# Patient Record
Sex: Female | Born: 1945 | Race: White | Hispanic: No | Marital: Married | Smoking: Never smoker
Health system: Southern US, Community
[De-identification: ages and names within clinical notes are randomized; demographics above are authoritative.]

## PROBLEM LIST (undated history)

## (undated) DIAGNOSIS — K224 Dyskinesia of esophagus: Secondary | ICD-10-CM

## (undated) DIAGNOSIS — E119 Type 2 diabetes mellitus without complications: Secondary | ICD-10-CM

## (undated) DIAGNOSIS — G4730 Sleep apnea, unspecified: Secondary | ICD-10-CM

## (undated) DIAGNOSIS — E785 Hyperlipidemia, unspecified: Secondary | ICD-10-CM

## (undated) DIAGNOSIS — D649 Anemia, unspecified: Secondary | ICD-10-CM

## (undated) DIAGNOSIS — M1990 Unspecified osteoarthritis, unspecified site: Secondary | ICD-10-CM

## (undated) DIAGNOSIS — E790 Hyperuricemia without signs of inflammatory arthritis and tophaceous disease: Secondary | ICD-10-CM

## (undated) DIAGNOSIS — K219 Gastro-esophageal reflux disease without esophagitis: Secondary | ICD-10-CM

## (undated) DIAGNOSIS — F419 Anxiety disorder, unspecified: Secondary | ICD-10-CM

## (undated) DIAGNOSIS — B379 Candidiasis, unspecified: Secondary | ICD-10-CM

## (undated) DIAGNOSIS — I679 Cerebrovascular disease, unspecified: Secondary | ICD-10-CM

## (undated) DIAGNOSIS — D126 Benign neoplasm of colon, unspecified: Secondary | ICD-10-CM

## (undated) DIAGNOSIS — K52832 Lymphocytic colitis: Secondary | ICD-10-CM

## (undated) DIAGNOSIS — I10 Essential (primary) hypertension: Secondary | ICD-10-CM

## (undated) DIAGNOSIS — H269 Unspecified cataract: Secondary | ICD-10-CM

## (undated) DIAGNOSIS — K5710 Diverticulosis of small intestine without perforation or abscess without bleeding: Secondary | ICD-10-CM

## (undated) DIAGNOSIS — G5600 Carpal tunnel syndrome, unspecified upper limb: Secondary | ICD-10-CM

## (undated) HISTORY — DX: Lymphocytic colitis: K52.832

## (undated) HISTORY — PX: COLONOSCOPY: SHX174

## (undated) HISTORY — DX: Benign neoplasm of colon, unspecified: D12.6

## (undated) HISTORY — DX: Candidiasis, unspecified: B37.9

## (undated) HISTORY — DX: Hyperuricemia without signs of inflammatory arthritis and tophaceous disease: E79.0

## (undated) HISTORY — DX: Hyperlipidemia, unspecified: E78.5

## (undated) HISTORY — DX: Unspecified cataract: H26.9

## (undated) HISTORY — PX: OTHER SURGICAL HISTORY: SHX169

## (undated) HISTORY — DX: Unspecified osteoarthritis, unspecified site: M19.90

## (undated) HISTORY — DX: Sleep apnea, unspecified: G47.30

## (undated) HISTORY — DX: Type 2 diabetes mellitus without complications: E11.9

## (undated) HISTORY — DX: Gastro-esophageal reflux disease without esophagitis: K21.9

## (undated) HISTORY — PX: CYST REMOVAL HAND: SHX6279

## (undated) HISTORY — DX: Anxiety disorder, unspecified: F41.9

## (undated) HISTORY — PX: APPENDECTOMY: SHX54

## (undated) HISTORY — DX: Anemia, unspecified: D64.9

## (undated) HISTORY — DX: Diverticulosis of small intestine without perforation or abscess without bleeding: K57.10

## (undated) HISTORY — DX: Dyskinesia of esophagus: K22.4

## (undated) HISTORY — DX: Fatty (change of) liver, not elsewhere classified: K76.0

## (undated) HISTORY — DX: Cerebrovascular disease, unspecified: I67.9

## (undated) HISTORY — PX: BREAST BIOPSY: SHX20

---
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
## 1990-06-03 HISTORY — PX: CARPAL TUNNEL RELEASE: SHX101

## 1997-11-03 ENCOUNTER — Ambulatory Visit (HOSPITAL_COMMUNITY): Admission: RE | Admit: 1997-11-03 | Discharge: 1997-11-03 | Payer: Self-pay | Admitting: Internal Medicine

## 1998-08-24 ENCOUNTER — Other Ambulatory Visit: Admission: RE | Admit: 1998-08-24 | Discharge: 1998-08-24 | Payer: Self-pay | Admitting: Internal Medicine

## 1999-02-12 ENCOUNTER — Encounter: Payer: Self-pay | Admitting: Internal Medicine

## 1999-02-12 ENCOUNTER — Ambulatory Visit (HOSPITAL_COMMUNITY): Admission: RE | Admit: 1999-02-12 | Discharge: 1999-02-12 | Payer: Self-pay | Admitting: Internal Medicine

## 2000-06-24 ENCOUNTER — Encounter: Payer: Self-pay | Admitting: Internal Medicine

## 2000-06-24 ENCOUNTER — Ambulatory Visit (HOSPITAL_COMMUNITY): Admission: RE | Admit: 2000-06-24 | Discharge: 2000-06-24 | Payer: Self-pay | Admitting: Internal Medicine

## 2001-08-21 ENCOUNTER — Encounter: Payer: Self-pay | Admitting: Internal Medicine

## 2001-08-21 ENCOUNTER — Ambulatory Visit (HOSPITAL_COMMUNITY): Admission: RE | Admit: 2001-08-21 | Discharge: 2001-08-21 | Payer: Self-pay | Admitting: Internal Medicine

## 2001-09-08 ENCOUNTER — Other Ambulatory Visit: Admission: RE | Admit: 2001-09-08 | Discharge: 2001-09-08 | Payer: Self-pay | Admitting: Internal Medicine

## 2002-11-04 ENCOUNTER — Encounter: Payer: Self-pay | Admitting: Internal Medicine

## 2002-11-04 ENCOUNTER — Ambulatory Visit (HOSPITAL_COMMUNITY): Admission: RE | Admit: 2002-11-04 | Discharge: 2002-11-04 | Payer: Self-pay | Admitting: Internal Medicine

## 2003-10-19 ENCOUNTER — Encounter: Admission: RE | Admit: 2003-10-19 | Discharge: 2003-10-19 | Payer: Self-pay | Admitting: Internal Medicine

## 2003-12-12 ENCOUNTER — Ambulatory Visit (HOSPITAL_COMMUNITY): Admission: RE | Admit: 2003-12-12 | Discharge: 2003-12-12 | Payer: Self-pay | Admitting: Internal Medicine

## 2005-08-14 ENCOUNTER — Ambulatory Visit (HOSPITAL_COMMUNITY): Admission: RE | Admit: 2005-08-14 | Discharge: 2005-08-14 | Payer: Self-pay | Admitting: Internal Medicine

## 2005-11-08 ENCOUNTER — Other Ambulatory Visit: Admission: RE | Admit: 2005-11-08 | Discharge: 2005-11-08 | Payer: Self-pay | Admitting: Internal Medicine

## 2006-01-14 ENCOUNTER — Ambulatory Visit: Payer: Self-pay | Admitting: Gastroenterology

## 2006-01-20 ENCOUNTER — Ambulatory Visit: Payer: Self-pay | Admitting: Internal Medicine

## 2006-01-20 ENCOUNTER — Encounter: Payer: Self-pay | Admitting: Internal Medicine

## 2006-11-05 ENCOUNTER — Ambulatory Visit (HOSPITAL_COMMUNITY): Admission: RE | Admit: 2006-11-05 | Discharge: 2006-11-05 | Payer: Self-pay | Admitting: Internal Medicine

## 2007-05-10 ENCOUNTER — Emergency Department (HOSPITAL_COMMUNITY): Admission: EM | Admit: 2007-05-10 | Discharge: 2007-05-10 | Payer: Self-pay | Admitting: Family Medicine

## 2007-06-16 ENCOUNTER — Encounter: Payer: Self-pay | Admitting: Internal Medicine

## 2007-06-19 ENCOUNTER — Ambulatory Visit (HOSPITAL_COMMUNITY): Admission: RE | Admit: 2007-06-19 | Discharge: 2007-06-19 | Payer: Self-pay | Admitting: Internal Medicine

## 2008-01-07 ENCOUNTER — Ambulatory Visit: Payer: Self-pay | Admitting: Cardiovascular Disease

## 2008-01-08 ENCOUNTER — Ambulatory Visit (HOSPITAL_COMMUNITY): Admission: RE | Admit: 2008-01-08 | Discharge: 2008-01-08 | Payer: Self-pay | Admitting: Internal Medicine

## 2008-01-12 ENCOUNTER — Encounter: Payer: Self-pay | Admitting: Internal Medicine

## 2008-01-18 ENCOUNTER — Ambulatory Visit: Payer: Self-pay

## 2008-01-18 ENCOUNTER — Encounter: Payer: Self-pay | Admitting: Cardiovascular Disease

## 2008-02-16 ENCOUNTER — Ambulatory Visit: Payer: Self-pay | Admitting: Cardiovascular Disease

## 2008-03-03 ENCOUNTER — Encounter: Payer: Self-pay | Admitting: Internal Medicine

## 2008-03-21 DIAGNOSIS — K760 Fatty (change of) liver, not elsewhere classified: Secondary | ICD-10-CM

## 2008-03-21 DIAGNOSIS — Z860100 Personal history of colon polyps, unspecified: Secondary | ICD-10-CM | POA: Insufficient documentation

## 2008-03-21 DIAGNOSIS — E782 Mixed hyperlipidemia: Secondary | ICD-10-CM

## 2008-03-21 DIAGNOSIS — I10 Essential (primary) hypertension: Secondary | ICD-10-CM

## 2008-03-21 DIAGNOSIS — K219 Gastro-esophageal reflux disease without esophagitis: Secondary | ICD-10-CM

## 2008-03-24 ENCOUNTER — Ambulatory Visit: Payer: Self-pay | Admitting: Internal Medicine

## 2008-04-05 ENCOUNTER — Ambulatory Visit: Payer: Self-pay | Admitting: Internal Medicine

## 2008-04-08 ENCOUNTER — Ambulatory Visit: Payer: Self-pay | Admitting: Internal Medicine

## 2008-04-11 LAB — CONVERTED CEMR LAB
Fecal Occult Blood: NEGATIVE
OCCULT 1: NEGATIVE
OCCULT 2: NEGATIVE
OCCULT 3: NEGATIVE
OCCULT 4: NEGATIVE
OCCULT 5: NEGATIVE

## 2008-09-28 ENCOUNTER — Ambulatory Visit (HOSPITAL_COMMUNITY): Admission: RE | Admit: 2008-09-28 | Discharge: 2008-09-28 | Payer: Self-pay | Admitting: Internal Medicine

## 2009-04-07 ENCOUNTER — Ambulatory Visit (HOSPITAL_COMMUNITY): Admission: RE | Admit: 2009-04-07 | Discharge: 2009-04-07 | Payer: Self-pay | Admitting: Internal Medicine

## 2009-12-08 ENCOUNTER — Emergency Department (HOSPITAL_COMMUNITY): Admission: EM | Admit: 2009-12-08 | Discharge: 2009-12-08 | Payer: Self-pay | Admitting: Emergency Medicine

## 2010-03-12 ENCOUNTER — Other Ambulatory Visit: Admission: RE | Admit: 2010-03-12 | Discharge: 2010-03-12 | Payer: Self-pay | Admitting: Internal Medicine

## 2010-07-02 ENCOUNTER — Other Ambulatory Visit (HOSPITAL_COMMUNITY): Payer: Self-pay | Admitting: Internal Medicine

## 2010-07-02 DIAGNOSIS — Z1231 Encounter for screening mammogram for malignant neoplasm of breast: Secondary | ICD-10-CM

## 2010-07-02 DIAGNOSIS — Z1239 Encounter for other screening for malignant neoplasm of breast: Secondary | ICD-10-CM

## 2010-07-11 ENCOUNTER — Ambulatory Visit (HOSPITAL_COMMUNITY)
Admission: RE | Admit: 2010-07-11 | Discharge: 2010-07-11 | Disposition: A | Discharge status: Home / Self Care | Payer: 59 | Source: Ambulatory Visit | Attending: Internal Medicine | Admitting: Internal Medicine

## 2010-07-11 DIAGNOSIS — Z1231 Encounter for screening mammogram for malignant neoplasm of breast: Secondary | ICD-10-CM | POA: Insufficient documentation

## 2010-10-16 NOTE — Assessment & Plan Note (Signed)
[facility]                            CARDIOLOGY OFFICE NOTE   NEYA, CREEGAN                     MRN:          [id number]  DATE:02/16/2008                            DOB:          [birth year]    PRIMARY CARE PHYSICIAN:  Lucky Cowboy, MD   REASON FOR VISIT:  Cardiac followup.   HISTORY OF PRESENT ILLNESS:  [name] is a pleasant 65 year old  Caucasian female with past medical history significant for hypertension,  diabetes mellitus, and hyperlipidemia who initially presented to my  office on January 07, 2008, with complaints of chest pain.  The patient is  noted to have a very strong family history of coronary artery disease  with her mother dying from congestive heart failure in 2008, at age of  63, her father dying of a heart attack at the age of 57, one brother  dying of heart attack at the age of 43, and a second brother who at age  65 was found to have double-vessel coronary artery disease.  Because of  this, the patient was very concerned about her symptoms.  She describes  substernal chest pressure that occurred while she was exerting herself.  This was also associated with occasional back tightness as well.  She  did notice mild shortness of breath when her chest pain occurred.  Because of her multiple coronary artery disease risk factors, her strong  family history of heart disease, and her symptoms, I referred her for an  exercise nuclear stress test as well as a surface echocardiogram.  The  studies are described in detail below, but were overall normal.  The  patient returns today and states that her chest pain has only occurred  occasionally, seems to be less intense that had been prior to her last  visit.  She denies any shortness of breath, palpitations, dizziness,  near syncope, syncope, nausea, vomiting, diaphoresis, orthopnea,  paroxysmal nocturnal dyspnea or lower extremity edema.  She does state  that she has been  under an increased amount of stress at home and does  relate some of her chest tightness to possible anxiety associated with  her increased stress level.   PAST MEDICAL HISTORY:  Unchanged and include hypertension,  hyperlipidemia, diabetes mellitus, and gastroesophageal reflux disease.   CURRENT MEDICATIONS:  1. Fenofibrate 134 mg once daily.  2. Metformin 500 mg twice daily.  3. Lasix 40 mg once daily.  4. Verapamil 60 mg at night.  5. Zetia 10 mg once daily.  6. Klor-Con 20 mEq once daily,  7. Enalapril 20 mg once daily.  8. Atenolol 100 mg once daily.   REVIEW OF SYSTEMS:  As stated in the history of present illness and is  otherwise negative.   PHYSICAL EXAMINATION:  GENERAL:  She is a pleasant middle-aged Caucasian  female in no acute distress.  VITALS:  Weight 172 pounds, blood pressure 178/85, pulse 69 and regular,  respirations 12 and nonlabored.  NECK:  No JVD.  No carotid bruits.  No lymphadenopathy.  No thyromegaly.  SKIN:  Warm and dry.  OROPHARYNX:  Mucous membranes moist.  LUNGS:  Clear to auscultation bilaterally without wheezes, rhonchi, or  crackles noted.  CARDIOVASCULAR:  Regular rate and rhythm without murmurs, gallops, or  rubs noted.  ABDOMEN:  Soft, nontender.  Bowel sounds present.  EXTREMITIES:  No evidence of edema.  Pulses are 2+ in all extremities.   DIAGNOSTIC STUDIES:  1. Exercise myocardial perfusion imaging study performed on January 18, 2008, showed that the patient exercised for 8 minutes and stopped      due to fatigue, but no chest pain.  She did reach 92% of her      predicted maximum heart rate for age.  She achieved 10.1 metabolic      equivalents.  There was normal contractility and thickening in all      areas of myocardium.  Overall, left ventricular function was      normal.  There was no perfusion evidence of ischemia.  Of note, she      did have 1-mm ST depression in the inferior and lateral leads that      was felt to be a  false positive.  2. 2-D surface echocardiogram performed on January 18, 2008 showed that      the overall left ventricular systolic function was vigorous with an      ejection fraction between 65 and 70%.  There were no left      ventricular regional wall motion abnormalities noted.  Left      ventricular wall thickness was mildly increased.  There was mild      focal basal septal hypertrophy.  Features were consistent with a      pseudonormal left ventricular filling pattern that was consistent      with abnormal relaxation.  The left atrium was mildly dilated at 4      cm with an index of 2.2 cm per meter squared.   ASSESSMENT AND PLAN:  Michele [name] is a pleasant 65 year old Caucasian  female with multiple risk factors for coronary artery disease including  hypertension, hyperlipidemia, diabetes mellitus, and a strong family  history of coronary artery disease who presented with chest pain, and is  now found by nuclear stress study to have no evidence of myocardial  ischemia.  Her echocardiogram demonstrates normal left ventricular  function and no significant valvular abnormalities.  Her blood pressure  does remain elevated today.  I would like to double her enalapril to 20  mg b.i.d.  I have encouraged her to check her blood pressures at home  and to alert [place] should her systolic blood pressure remain greater than  140.  We will make further changes in her medications at that time.  I  did not recommend any further cardiac workup at the current time.  She  will continue to follow with Dr. Oneta Rack who is her primary care  physician.  We will be glad to see her back in this office on an as-  needed basis.     Verne Carrow, MD  Electronically Signed    CM/MedQ  DD: 02/16/2008  DT: 02/17/2008  Job #: [id number]   cc:   Lucky Cowboy, M.D.

## 2010-10-16 NOTE — Assessment & Plan Note (Signed)
[facility]                            CARDIOLOGY OFFICE NOTE   AUBREA, MEIXNER                     MRN:          [id number]  DATE:01/07/2008                            DOB:          03/19/1946    PRIMARY CARE PHYSICIAN:  Lucky Cowboy, M.D.   REASON FOR VISIT:  Chest pain.   HISTORY OF PRESENT ILLNESS:  Ms. Delaurentis is a pleasant 65 year old  Caucasian female with past medical  history significant for  hypertension, diabetes, and hyperlipidemia who presents today with  complaints of chest pain.  The patient has a very strong family history  of coronary artery disease with her mother dying from congestive heart  failure in 2008 at age of 67, her father dying of heart attack at the  age of 51.  One brother dying of heart attack at the age of 58, and a  second brother who at age of 66 was found to have double-vessel coronary  artery disease.  Because of this, the patient has become very concerned.  She describes substernal chest pressure that occurs on days exerting  herself.  This is associated with occasional back tightness as well.  There sometime seems to be a mild change in her breathing patterns when  the chest pain occurs.  The chest pain lasts for several minutes and  resolves when she sits down to rest.  She denies any palpitations,  dizziness, near syncope, syncope, nausea, vomiting, diaphoresis,  orthopnea, paroxysmal nocturnal dyspnea, or lower extremity edema.   PAST MEDICAL HISTORY:  1. Hypertension.  2. Hyperlipidemia with elevation in her liver function test with the      use of Lipitor in the past.  The patient apparently had a CT scan      that showed fatty liver around that time.  Her liver function test      did normalize off the Lipitor and she is being continued since that      time on fenofibrate and Zetia for her hyperlipidemia.  3. Diabetes mellitus diagnosed in January of 2006.  4. Gastroesophageal reflux  disease.   PAST SURGICAL HISTORY:  1. Appendectomy.  2. Tonsillectomy.   ALLERGIES:  LIPITOR, which caused elevated liver function test.   CURRENT MEDICATIONS:  1. Fenofibrate 134 mg once daily.  2. Metformin 1 tablet twice daily (dose not documented).  3. Furosemide 40 mg once daily.  4. Verapamil 120 mg 1 half tablet at night.  5. Zetia 10 mg once daily.  6. Klor-Con 20 mEq once daily.  7. Enalapril 20 mg once daily.  8. Atenolol 100 mg once daily.   SOCIAL HISTORY:  The patient denies use of tobacco, alcohol, or illicit  drugs.  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  She has one child who is healthy.   FAMILY HISTORY:  As stated above, there is a significant family history  of coronary artery disease with her father dying at the age of 83 from  an MI, brother dying at the age of 63 from an MI, and another brother  having the diagnosis  of double-vessel coronary disease and subsequent  stent placement at the age of 65.   REVIEW OF SYSTEMS:  As stated in the history of present illness, is  otherwise negative.   PHYSICAL EXAMINATION:  GENERAL:  She is a pleasant, well-appearing  Caucasian female in no acute distress.  VITAL SIGNS:  Blood pressure 174/82, pulse 72, and respirations 12  (recheck blood pressure 166/82).  NECK:  No JVD.  No carotid bruits.  SKIN:  Warm and dry.  LUNGS:  Clear to auscultation bilaterally without wheezes, rhonchi, or  crackles noted.  CARDIOVASCULAR:  Regular rate and rhythm without murmurs, gallops, or  rubs noted.  ABDOMEN:  Soft and nontender.  Bowel sounds present.  EXTREMITIES:  No evidence of edema.  Pulses are 2+ in the dorsalis  pedis, posterior tibial, and bilateral radial arteries.   DIAGNOSTIC STUDIES:  A 12-lead electrocardiogram obtained in our office  today shows normal sinus rhythm with a ventricular rate of 72 beats per  minute and low-voltage QRS.  There are no ischemic changes noted.  All  of the  intervals are within normal limits.   ASSESSMENT AND PLAN:  This is a pleasant 65 year old Caucasian female  with multiple cardiac risk factors including hypertension,  hyperlipidemia, diabetes mellitus, and a strong family history of  coronary artery disease who presents with complaints of mild chest  discomfort and back pain.  Her EKG shows low normal QRS voltage.  Because of her chest pain, back pain, and low voltage QRS, I have  elected to perform a 2-D surface echocardiogram to rule out pericardial  effusion and to assess her left ventricular function.  I have also  performed a myocardial perfusion stress study with exercise stress.  The  patient is agreeable with this.  She will plan on seeing me on return  several weeks after her studies are performed.  I will make no  medication changes at the current time.     Verne Carrow, MD  Electronically Signed   CM/MedQ  DD: 01/07/2008  DT: 01/08/2008  Job #: [id number]   cc:   Pricilla Riffle, MD, [facility]  Lovenia Kim, D.O.  Lucky Cowboy, M.D.

## 2011-01-02 ENCOUNTER — Ambulatory Visit (HOSPITAL_COMMUNITY)
Admission: RE | Admit: 2011-01-02 | Discharge: 2011-01-02 | Disposition: A | Discharge status: Home / Self Care | Payer: 59 | Source: Ambulatory Visit | Attending: Internal Medicine | Admitting: Internal Medicine

## 2011-01-02 ENCOUNTER — Other Ambulatory Visit (HOSPITAL_COMMUNITY): Payer: Self-pay | Admitting: Internal Medicine

## 2011-01-02 DIAGNOSIS — M25519 Pain in unspecified shoulder: Secondary | ICD-10-CM

## 2011-03-05 LAB — GLUCOSE, CAPILLARY
Glucose-Capillary: 110 — ABNORMAL HIGH
Glucose-Capillary: 113 — ABNORMAL HIGH

## 2011-03-18 ENCOUNTER — Other Ambulatory Visit: Payer: Self-pay | Admitting: Internal Medicine

## 2011-03-18 DIAGNOSIS — N63 Unspecified lump in unspecified breast: Secondary | ICD-10-CM

## 2011-03-25 ENCOUNTER — Other Ambulatory Visit: Payer: Self-pay | Admitting: Internal Medicine

## 2011-03-25 ENCOUNTER — Ambulatory Visit
Admission: RE | Admit: 2011-03-25 | Discharge: 2011-03-25 | Disposition: A | Discharge status: Home / Self Care | Payer: 59 | Source: Ambulatory Visit | Attending: Internal Medicine | Admitting: Internal Medicine

## 2011-03-25 DIAGNOSIS — N63 Unspecified lump in unspecified breast: Secondary | ICD-10-CM

## 2011-06-24 ENCOUNTER — Other Ambulatory Visit: Payer: Self-pay | Admitting: Internal Medicine

## 2011-06-24 DIAGNOSIS — Z1231 Encounter for screening mammogram for malignant neoplasm of breast: Secondary | ICD-10-CM

## 2011-08-02 ENCOUNTER — Encounter: Payer: Self-pay | Admitting: Internal Medicine

## 2011-08-19 ENCOUNTER — Ambulatory Visit
Admission: RE | Admit: 2011-08-19 | Discharge: 2011-08-19 | Disposition: A | Discharge status: Home / Self Care | Payer: 59 | Source: Ambulatory Visit | Attending: Internal Medicine | Admitting: Internal Medicine

## 2011-08-19 DIAGNOSIS — Z1231 Encounter for screening mammogram for malignant neoplasm of breast: Secondary | ICD-10-CM

## 2012-02-20 ENCOUNTER — Encounter: Payer: Self-pay | Admitting: Internal Medicine

## 2012-04-02 ENCOUNTER — Other Ambulatory Visit (HOSPITAL_COMMUNITY): Payer: Self-pay | Admitting: Internal Medicine

## 2012-04-02 DIAGNOSIS — M8580 Other specified disorders of bone density and structure, unspecified site: Secondary | ICD-10-CM

## 2012-04-07 ENCOUNTER — Ambulatory Visit (HOSPITAL_COMMUNITY)
Admission: RE | Admit: 2012-04-07 | Discharge: 2012-04-07 | Disposition: A | Discharge status: Home / Self Care | Payer: 59 | Source: Ambulatory Visit | Attending: Internal Medicine | Admitting: Internal Medicine

## 2012-04-07 DIAGNOSIS — M8580 Other specified disorders of bone density and structure, unspecified site: Secondary | ICD-10-CM

## 2012-04-07 DIAGNOSIS — Z780 Asymptomatic menopausal state: Secondary | ICD-10-CM | POA: Insufficient documentation

## 2012-04-07 DIAGNOSIS — Z13820 Encounter for screening for osteoporosis: Secondary | ICD-10-CM | POA: Insufficient documentation

## 2012-04-07 DIAGNOSIS — M899 Disorder of bone, unspecified: Secondary | ICD-10-CM | POA: Insufficient documentation

## 2012-04-07 DIAGNOSIS — M949 Disorder of cartilage, unspecified: Secondary | ICD-10-CM | POA: Insufficient documentation

## 2012-06-01 ENCOUNTER — Encounter (HOSPITAL_COMMUNITY): Payer: Self-pay | Admitting: Cardiology

## 2012-06-01 ENCOUNTER — Emergency Department (HOSPITAL_COMMUNITY)
Admission: EM | Admit: 2012-06-01 | Discharge: 2012-06-01 | Disposition: A | Discharge status: Home / Self Care | Payer: 59 | Attending: Emergency Medicine | Admitting: Emergency Medicine

## 2012-06-01 DIAGNOSIS — E119 Type 2 diabetes mellitus without complications: Secondary | ICD-10-CM | POA: Insufficient documentation

## 2012-06-01 DIAGNOSIS — R197 Diarrhea, unspecified: Secondary | ICD-10-CM | POA: Insufficient documentation

## 2012-06-01 DIAGNOSIS — R42 Dizziness and giddiness: Secondary | ICD-10-CM | POA: Insufficient documentation

## 2012-06-01 DIAGNOSIS — I10 Essential (primary) hypertension: Secondary | ICD-10-CM | POA: Insufficient documentation

## 2012-06-01 DIAGNOSIS — E860 Dehydration: Secondary | ICD-10-CM | POA: Insufficient documentation

## 2012-06-01 DIAGNOSIS — Z79899 Other long term (current) drug therapy: Secondary | ICD-10-CM | POA: Insufficient documentation

## 2012-06-01 HISTORY — DX: Essential (primary) hypertension: I10

## 2012-06-01 LAB — CBC
HCT: 37.6 % (ref 36.0–46.0)
Hemoglobin: 12.3 g/dL (ref 12.0–15.0)
MCH: 27.8 pg (ref 26.0–34.0)
MCHC: 32.7 g/dL (ref 30.0–36.0)
MCV: 85.1 fL (ref 78.0–100.0)
RBC: 4.42 MIL/uL (ref 3.87–5.11)

## 2012-06-01 LAB — BASIC METABOLIC PANEL
Creatinine, Ser: 1.06 mg/dL (ref 0.50–1.10)
GFR calc Af Amer: 62 mL/min — ABNORMAL LOW (ref >90)
Sodium: 142 mEq/L (ref 135–145)

## 2012-06-01 MED ORDER — SODIUM CHLORIDE 0.9 % IV BOLUS (SEPSIS)
1000.0000 mL | Freq: Once | INTRAVENOUS | Status: AC
  Administered 2012-06-01: 1000 mL via INTRAVENOUS

## 2012-06-01 NOTE — ED Provider Notes (Signed)
History     CSN: [id number]  Arrival date & time 06/01/12  1600   First MD Initiated Contact with Patient 06/01/12 1943      Chief Complaint  Patient presents with  . Illegal value: [    dehydrated    (Consider location/radiation/quality/duration/timing/severity/associated sxs/prior treatment) HPI Comments: Patient presents with complaint of diarrhea for the past 2 weeks. Patient states that she has 6-7 episodes of watery, foul-smelling diarrhea that occurs in the late evening and overnight. She has seen her primary care physician for this. Stool cultures have been ordered however patient has not been able to obtain these to this point. Today she was feeling lightheaded and called her doctor. She was told to go to the emergency department for fluids. Patient denies fevers, cold symptoms, nausea, vomiting, chest pain, shortness of breath, abdominal pain. She has not noted blood in her stool. Normal urination. She continues good oral fluid intake. Colonoscopy 5 years ago. No other history of gastrointestinal problems. Patient denies recent travel. She was on a course of penicillin approximately one month ago due to an infected tooth. She does not think that she had diarrhea prior to this. Per her records, she is currently on Cipro 500 mg twice a day. She has held her Lasix due to diarrhea. Onset of symptoms is acute. Course is constant. Nothing makes symptoms better or worse.  The history is provided by the patient.    Past Medical History  Diagnosis Date  . Hypertension   . Diabetes mellitus without complication     History reviewed. No pertinent past surgical history.  History reviewed. No pertinent family history.  History  Substance Use Topics  . Smoking status: Never Smoker   . Smokeless tobacco: Not on file  . Alcohol Use: No    OB History    Grav Para Term Preterm Abortions TAB SAB Ect Mult Living                  Review of Systems  Constitutional: Negative for  fever.  HENT: Negative for sore throat and rhinorrhea.   Eyes: Negative for redness.  Respiratory: Negative for cough.   Cardiovascular: Negative for chest pain.  Gastrointestinal: Positive for diarrhea. Negative for nausea, vomiting, abdominal pain, constipation and blood in stool.  Genitourinary: Negative for dysuria.  Musculoskeletal: Negative for myalgias.  Skin: Negative for rash.  Neurological: Positive for light-headedness. Negative for syncope and headaches.    Allergies  Review of patient's allergies indicates no known allergies.  Home Medications   Current Outpatient Rx  Name  Route  Sig  Dispense  Refill  . ALPRAZOLAM 0.5 MG PO TABS   Oral   Take 0.5 mg by mouth 2 (two) times daily.         . ATENOLOL 100 MG PO TABS   Oral   Take 50 mg by mouth 2 (two) times daily.         Marland Kitchen BIOTIN 1000 MCG PO TABS   Oral   Take 1,000 mcg by mouth daily.         . CHOLECALCIFEROL 400 UNITS PO TABS   Oral   Take 400 Units by mouth 2 (two) times daily.         Marland Kitchen CIPROFLOXACIN HCL 500 MG PO TABS   Oral   Take 500 mg by mouth 2 (two) times daily. 7 day course filled 05/29/12         . CLONIDINE HCL 0.2 MG PO TABS  Oral   Take 0.2 mg by mouth 2 (two) times daily.         . ENALAPRIL MALEATE 20 MG PO TABS   Oral   Take 20 mg by mouth 2 (two) times daily.          Marland Kitchen EZETIMIBE 10 MG PO TABS   Oral   Take 10 mg by mouth every evening.         . FENOFIBRATE MICRONIZED 134 MG PO CAPS   Oral   Take 134 mg by mouth every evening.         Marland Kitchen FLAXSEED OIL PO   Oral   Take 1 capsule by mouth daily.         . FUROSEMIDE 40 MG PO TABS   Oral   Take 40 mg by mouth daily.         Marland Kitchen HYOSCYAMINE SULFATE 0.125 MG PO TABS   Oral   Take 0.125 mg by mouth every 4 (four) hours as needed. For GI spasms         . METFORMIN HCL 500 MG PO TABS   Oral   Take 500 mg by mouth 3 (three) times daily.         Marland Kitchen MILK THISTLE 1000 MG PO CAPS   Oral   Take 1,000  mg by mouth daily.         Marland Kitchen FISH OIL 1000 MG PO CAPS   Oral   Take 1,000 mg by mouth daily.         Marland Kitchen POTASSIUM CHLORIDE CRYS ER 20 MEQ PO TBCR   Oral   Take 20 mEq by mouth daily.          . RED YEAST RICE 600 MG PO CAPS   Oral   Take 600 mg by mouth daily.         Marland Kitchen VERAPAMIL HCL 120 MG PO TABS   Oral   Take 120 mg by mouth 2 (two) times daily.           BP 197/92  Pulse 61  Temp 97.5 F (36.4 C) (Oral)  Resp 18  SpO2 98%  Physical Exam  Nursing note and vitals reviewed. Constitutional: She appears well-developed and well-nourished.  HENT:  Head: Normocephalic and atraumatic.  Mouth/Throat: Uvula is midline and oropharynx is clear and moist. Mucous membranes are not dry.  Eyes: Conjunctivae normal are normal. Right eye exhibits no discharge. Left eye exhibits no discharge.  Neck: Normal range of motion. Neck supple.  Cardiovascular: Normal rate, regular rhythm and normal heart sounds.   No murmur heard. Pulmonary/Chest: Effort normal and breath sounds normal. No respiratory distress. She has no wheezes.  Abdominal: Soft. Bowel sounds are normal. There is no tenderness. There is no rebound and no guarding.  Neurological: She is alert.  Skin: Skin is warm and dry.  Psychiatric: She has a normal mood and affect.    ED Course  Procedures (including critical care time)  Labs Reviewed  BASIC METABOLIC PANEL - Abnormal; Notable for the following:    Glucose, Bld 103 (*)     GFR calc non Af Amer 53 (*)     GFR calc Af Amer 62 (*)     All other components within normal limits  CBC  STOOL CULTURE  CLOSTRIDIUM DIFFICILE BY PCR   No results found.   1. Dehydration   2. Diarrhea     8:21 PM Patient seen and examined. Work-up initiated. Fluids  ordered. Will attempt to get cultures and PCR for C. Diff if patient has BM.   Vital signs reviewed and are as follows: Filed Vitals:   06/01/12 1936  BP: 197/92  Pulse: 61  Temp: 97.5 F (36.4 C)  Resp:  18   Patient d/w Dr. Blinda Leatherwood who has seen. Patient has not had a BM in ED and has been tolerating PO's.   Encouraged to maintain PO's, continue cipro, and get the planned stool samples to PCP ASAP.   The patient was urged to return to the Emergency Department immediately with worsening of current symptoms, worsening abdominal pain, persistent vomiting, blood noted in stools, fever, or any other concerns. The patient verbalized understanding.     MDM  Patient with 2 weeks of diarrhea, good oral intake. She did not have a bowel movement in emergency department so stool cultures and C. difficile PCR was not sent. She does not appear severely dehydrated. She does not have any electrolyte abnormalities. She has a normal white blood cell count. Her abdomen is soft and nontender. No indication for CT scan tonight. Patient appears well, is stable, and has PCP followup. Strict return instructions given. She will need further workup to determine clear etiology, however do not suspect life threatening or dangerous condition tonight.      [place], [place] 06/02/12 [phone]

## 2012-06-01 NOTE — ED Notes (Signed)
Pt reports diarrhea for the past 2 weeks. Went to PCP office was told to bring a stool sample but has been unable to do so. Reports weakness and nausea.

## 2012-06-03 ENCOUNTER — Emergency Department (HOSPITAL_COMMUNITY)
Admission: EM | Admit: 2012-06-03 | Discharge: 2012-06-04 | Disposition: A | Discharge status: Home / Self Care | Payer: 59 | Attending: Emergency Medicine | Admitting: Emergency Medicine

## 2012-06-03 ENCOUNTER — Emergency Department (HOSPITAL_COMMUNITY): Payer: 59

## 2012-06-03 ENCOUNTER — Encounter (HOSPITAL_COMMUNITY): Payer: Self-pay | Admitting: *Deleted

## 2012-06-03 DIAGNOSIS — Z79899 Other long term (current) drug therapy: Secondary | ICD-10-CM | POA: Insufficient documentation

## 2012-06-03 DIAGNOSIS — I10 Essential (primary) hypertension: Secondary | ICD-10-CM

## 2012-06-03 DIAGNOSIS — R51 Headache: Secondary | ICD-10-CM

## 2012-06-03 DIAGNOSIS — G5600 Carpal tunnel syndrome, unspecified upper limb: Secondary | ICD-10-CM | POA: Insufficient documentation

## 2012-06-03 DIAGNOSIS — E119 Type 2 diabetes mellitus without complications: Secondary | ICD-10-CM | POA: Insufficient documentation

## 2012-06-03 HISTORY — DX: Carpal tunnel syndrome, unspecified upper limb: G56.00

## 2012-06-03 MED ORDER — FENTANYL CITRATE 0.05 MG/ML IJ SOLN
INTRAMUSCULAR | Status: AC
  Filled 2012-06-03: qty 2

## 2012-06-03 MED ORDER — OXYCODONE-ACETAMINOPHEN 5-325 MG PO TABS
2.0000 | ORAL_TABLET | Freq: Once | ORAL | Status: AC
  Administered 2012-06-04: 2 via ORAL
  Filled 2012-06-03: qty 2

## 2012-06-03 MED ORDER — METOCLOPRAMIDE HCL 5 MG/ML IJ SOLN
10.0000 mg | Freq: Once | INTRAMUSCULAR | Status: AC
  Administered 2012-06-04: 10 mg via INTRAMUSCULAR
  Filled 2012-06-03: qty 2

## 2012-06-03 MED ORDER — FENTANYL CITRATE 0.05 MG/ML IJ SOLN
100.0000 ug | Freq: Once | INTRAMUSCULAR | Status: AC
  Administered 2012-06-03: 100 ug via NASAL

## 2012-06-03 NOTE — ED Notes (Signed)
Pt back from CT

## 2012-06-03 NOTE — ED Provider Notes (Signed)
History     CSN: [id number]  Arrival date & time 06/03/12  8295   First MD Initiated Contact with Patient 06/03/12 1942      Chief Complaint  Patient presents with  . Headache  . Hypertension    (Consider location/radiation/quality/duration/timing/severity/associated sxs/prior treatment) HPI 67 year old female presents with less than 6 hours of a somewhat acute onset headache over several minutes starting between 3:00 and 3:30 this afternoon without any change in speech or vision swallowing or understanding, she also is no weakness numbness or incoordination, she is no treatment prior to arrival, she does not usually have headaches like this, her headache is severe, there was no thunderclap onset, she also said nonbloody diarrhea about a dozen times a day for the last couple weeks and is here to drop of stool samples for cultures, ova and parasites, and C. difficile. She had unremarkable labs in the ED within the last 2 days for diarrhea. Past Medical History  Diagnosis Date  . Hypertension   . Diabetes mellitus without complication   . Carpal tunnel syndrome     Past Surgical History  Procedure Date  . Appendectomy     History reviewed. No pertinent family history.  History  Substance Use Topics  . Smoking status: Never Smoker   . Smokeless tobacco: Not on file  . Alcohol Use: No    OB History    Grav Para Term Preterm Abortions TAB SAB Ect Mult Living                  Review of Systems 10 Systems reviewed and are negative for acute change except as noted in the HPI. Allergies  Review of patient's allergies indicates no known allergies.  Home Medications   Current Outpatient Rx  Name  Route  Sig  Dispense  Refill  . ALPRAZOLAM 0.5 MG PO TABS   Oral   Take 0.5 mg by mouth 2 (two) times daily.         . ATENOLOL 100 MG PO TABS   Oral   Take 50 mg by mouth 2 (two) times daily.         Marland Kitchen BIOTIN 1000 MCG PO TABS   Oral   Take 1,000 mcg by mouth daily.        . CHOLECALCIFEROL 400 UNITS PO TABS   Oral   Take 400 Units by mouth 2 (two) times daily.         Marland Kitchen CIPROFLOXACIN HCL 500 MG PO TABS   Oral   Take 500 mg by mouth 2 (two) times daily. 7 day course filled 05/29/12         . CLONIDINE HCL 0.2 MG PO TABS   Oral   Take 0.2 mg by mouth 2 (two) times daily.         . ENALAPRIL MALEATE 20 MG PO TABS   Oral   Take 20 mg by mouth 2 (two) times daily.          Marland Kitchen EZETIMIBE 10 MG PO TABS   Oral   Take 10 mg by mouth every evening.         . FENOFIBRATE MICRONIZED 134 MG PO CAPS   Oral   Take 134 mg by mouth every evening.         Marland Kitchen FLAXSEED OIL PO   Oral   Take 1 capsule by mouth daily.         . FUROSEMIDE 40 MG PO TABS   Oral  Take 40 mg by mouth daily.         Marland Kitchen HYOSCYAMINE SULFATE 0.125 MG PO TABS   Oral   Take 0.125 mg by mouth every 4 (four) hours as needed. For GI spasms         . METFORMIN HCL 500 MG PO TABS   Oral   Take 500 mg by mouth 3 (three) times daily.         Marland Kitchen MILK THISTLE 1000 MG PO CAPS   Oral   Take 1,000 mg by mouth daily.         Marland Kitchen FISH OIL 1000 MG PO CAPS   Oral   Take 1,000 mg by mouth daily.         Marland Kitchen POTASSIUM CHLORIDE CRYS ER 20 MEQ PO TBCR   Oral   Take 20 mEq by mouth daily.          . RED YEAST RICE 600 MG PO CAPS   Oral   Take 600 mg by mouth daily.         Marland Kitchen VERAPAMIL HCL 120 MG PO TABS   Oral   Take 120 mg by mouth 2 (two) times daily.         Marland Kitchen METOCLOPRAMIDE HCL 10 MG PO TABS   Oral   Take 1 tablet (10 mg total) by mouth every 6 (six) hours as needed (nausea/headache).   4 tablet   0   . OXYCODONE-ACETAMINOPHEN 5-325 MG PO TABS   Oral   Take 2 tablets by mouth every 4 (four) hours as needed for pain.   6 tablet   0     BP 208/80  Pulse 64  Temp 97.7 F (36.5 C) (Oral)  Resp 16  SpO2 98%  Physical Exam  Nursing note and vitals reviewed. Constitutional:       Awake, alert, nontoxic appearance with baseline speech for  patient.  HENT:  Head: Atraumatic.  Mouth/Throat: No oropharyngeal exudate.  Eyes: EOM are normal. Pupils are equal, round, and reactive to light. Right eye exhibits no discharge. Left eye exhibits no discharge.  Neck: Neck supple.  Cardiovascular: Normal rate and regular rhythm.   No murmur heard. Pulmonary/Chest: Effort normal and breath sounds normal. No stridor. No respiratory distress. She has no wheezes. She has no rales. She exhibits no tenderness.  Abdominal: Soft. Bowel sounds are normal. She exhibits no mass. There is no tenderness. There is no rebound.  Musculoskeletal: She exhibits no edema and no tenderness.       Baseline ROM, moves extremities with no obvious new focal weakness.  Lymphadenopathy:    She has no cervical adenopathy.  Neurological: She is alert.       Awake, alert, cooperative and aware of situation; motor strength bilaterally; sensation normal to light touch bilaterally; peripheral visual fields full to confrontation; no facial asymmetry; tongue midline; major cranial nerves appear intact; no pronator drift, normal finger to nose bilaterally, baseline gait without new ataxia.  Skin: No rash noted.  Psychiatric: She has a normal mood and affect.    ED Course  Procedures (including critical care time)   Labs Reviewed  CLOSTRIDIUM DIFFICILE BY PCR  STOOL CULTURE  OVA AND PARASITE EXAMINATION   Ct Head Wo Contrast  06/03/2012  *RADIOLOGY REPORT*  Clinical Data: Headache, hypertension.  CT HEAD WITHOUT CONTRAST  Technique:  Contiguous axial images were obtained from the base of the skull through the vertex without contrast.  Comparison: 12/08/2009  Findings: No  acute intracranial abnormality.  Specifically, no hemorrhage, hydrocephalus, mass lesion, acute infarction, or significant intracranial injury.  No acute calvarial abnormality.  Mild mucosal thickening in maxillary sinuses and scattered ethmoid air cells.  Mastoids are clear.  IMPRESSION: No acute  intracranial abnormality.  Chronic sinusitis.   Original Report Authenticated By: Charlett Nose, M.D.      1. Headache   2. HYPERTENSION       MDM  Patient / Family / Caregiver understand and agree with initial ED impression and plan with expectations set for ED visit.Patient / Family / Caregiver informed of clinical course, understand medical decision-making process, and agree with plan.I doubt any other EMC precluding discharge at this time including, but not necessarily limited to the following:SAH, CVA, HTN crisis.        Hurman Horn, MD 06/04/12 2119

## 2012-06-03 NOTE — ED Notes (Signed)
Pt states she checked her BP at lunch and it was elevated and she checked again before she left to come to the E.D. and her systolic was above 200. Pt states that she is also having a HA around 15:30. Pt states that she tried taking excedrin migraine and advil with no relief. Pt states that she was seen here Monday for diarhea and she was suppose to bring a sample (which she has.) pt states she took her cloadine and atenolol. around 16:00. Pt BP elevated in triage. Pt has no neuro deficits. A&Ox4. Able to follow commands and move all extremities.

## 2012-06-04 LAB — CLOSTRIDIUM DIFFICILE BY PCR: Toxigenic C. Difficile by PCR: NEGATIVE

## 2012-06-04 MED ORDER — OXYCODONE-ACETAMINOPHEN 5-325 MG PO TABS: 2.0000 | ORAL_TABLET | ORAL | Status: DC | PRN

## 2012-06-04 MED ORDER — METOCLOPRAMIDE HCL 10 MG PO TABS: 10.0000 mg | ORAL_TABLET | Freq: Four times a day (QID) | ORAL | Status: DC | PRN

## 2012-06-04 NOTE — ED Provider Notes (Signed)
Medical screening examination/treatment/procedure(s) were conducted as a shared visit with non-physician practitioner(s) and myself.  I personally evaluated the patient during the encounter.  Repeat evaluation reveals patient feeling much better. Agree with care plan.  Gilda Crease, MD 06/04/12 [phone]

## 2012-06-04 NOTE — ED Notes (Signed)
Pt given discharge paperwork and prescriptions; pt verbalized understanding of discharge instructions and follow-up care; pt did not have any additional questions regarding discharge;

## 2012-06-05 LAB — OVA AND PARASITE EXAMINATION: Ova and parasites: NONE SEEN

## 2012-06-07 LAB — STOOL CULTURE

## 2012-06-26 ENCOUNTER — Other Ambulatory Visit: Payer: Self-pay | Admitting: Physician Assistant

## 2012-06-26 DIAGNOSIS — I10 Essential (primary) hypertension: Secondary | ICD-10-CM

## 2012-07-01 ENCOUNTER — Ambulatory Visit
Admission: RE | Admit: 2012-07-01 | Discharge: 2012-07-01 | Disposition: A | Discharge status: Home / Self Care | Payer: 59 | Source: Ambulatory Visit | Attending: Physician Assistant | Admitting: Physician Assistant

## 2012-07-01 DIAGNOSIS — I10 Essential (primary) hypertension: Secondary | ICD-10-CM

## 2012-08-24 ENCOUNTER — Other Ambulatory Visit: Payer: Self-pay

## 2012-08-24 DIAGNOSIS — Z1231 Encounter for screening mammogram for malignant neoplasm of breast: Secondary | ICD-10-CM

## 2012-09-01 ENCOUNTER — Encounter: Payer: Self-pay | Admitting: *Deleted

## 2012-09-01 ENCOUNTER — Telehealth: Payer: Self-pay | Admitting: Internal Medicine

## 2012-09-01 NOTE — Telephone Encounter (Signed)
Left a message for patient to call me. 

## 2012-09-01 NOTE — Telephone Encounter (Signed)
Patient states she had an episode of diarrhea in January that she ended up in the hospital to get IVF. Her PCP treated her. She had been better but is having diarrhea a night again. States she gets up 6-7 times during the night with diarrhea. Sometimes it occurs when she is sleeping. Does not occur during the day. Her PCP has changed some of her medications and recommended she see her GI MD. Scheduled with Mike Gip, PA on 09/02/12 at 9:00 AM.

## 2012-09-02 ENCOUNTER — Ambulatory Visit (INDEPENDENT_AMBULATORY_CARE_PROVIDER_SITE_OTHER): Payer: 59 | Admitting: Physician Assistant

## 2012-09-02 ENCOUNTER — Encounter: Payer: Self-pay | Admitting: Internal Medicine

## 2012-09-02 ENCOUNTER — Encounter: Payer: Self-pay | Admitting: Physician Assistant

## 2012-09-02 VITALS — BP 110/70 | HR 68 | Ht 63.0 in | Wt 166.4 lb

## 2012-09-02 DIAGNOSIS — R197 Diarrhea, unspecified: Secondary | ICD-10-CM

## 2012-09-02 DIAGNOSIS — R110 Nausea: Secondary | ICD-10-CM

## 2012-09-02 DIAGNOSIS — K219 Gastro-esophageal reflux disease without esophagitis: Secondary | ICD-10-CM

## 2012-09-02 DIAGNOSIS — Z86010 Personal history of colonic polyps: Secondary | ICD-10-CM

## 2012-09-02 MED ORDER — OMEPRAZOLE 20 MG PO CPDR: 20.0000 mg | DELAYED_RELEASE_CAPSULE | Freq: Every day | ORAL | Status: DC

## 2012-09-02 MED ORDER — METRONIDAZOLE 250 MG PO TABS: 250.0000 mg | ORAL_TABLET | Freq: Four times a day (QID) | ORAL | Status: DC

## 2012-09-02 MED ORDER — MOVIPREP 100 G PO SOLR: 1.0000 | Freq: Once | ORAL | Status: DC

## 2012-09-02 MED ORDER — OMEPRAZOLE 20 MG PO CPDR: 20.0000 mg | DELAYED_RELEASE_CAPSULE | Freq: Two times a day (BID) | ORAL | Status: DC

## 2012-09-02 NOTE — Progress Notes (Signed)
Reviewed, I totally agree with the empirical Flagyl.

## 2012-09-02 NOTE — Patient Instructions (Addendum)
We sent prescriptions for Flagyl , take 1 tab 4 times daily with food. We sent the prescriptions to [facility]. We also sent the Moviprep, colonoscopy prep.  Continue Welchol once daily .   You have been scheduled for a colonoscopy with propofol. Please follow written instructions given to you at your visit today.  Please pick up your prep kit at the pharmacy within the next 1-3 days. If you use inhalers (even only as needed), please bring them with you on the day of your procedure.

## 2012-09-02 NOTE — Progress Notes (Signed)
Subjective:    Patient ID: Michele [name], female    DOB: 03-06-1946, 67 y.o.   MRN: [id number]  HPI Michele [name] is a very nice 66 year old white female known to Dr. [name] from prior evaluation for Hemoccult-positive stool. 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 She had upper endoscopy in 2009 which was a normal exam. She was recommended for 5 year followup colonoscopy which has not been done to date. She states she started having problems with diarrhea around Christmas time. She denies taking any antibiotics at that time though does remember dealing with an infected tooth. She had an ER visit and then also had stool cultures stool for O&P and stool for C. difficile by PCR all done on 06/03/2012 withdrawal negative. She says after that time her symptoms got better and then recurred about 3 weeks ago. She says she feels like she's been progressively worse since. She's been having nausea which seems most prominent as prior to episodes of diarrhea area she has been having. Frequent nighttime episodes of urgency and diarrhea with incontinence. She says that her worst she's been up 45 times in 198 change in her sheets do to incontinence. She says she has had some diarrhea during the day but the symptoms seem more prominent at night. Her appetite is been somewhat decrease her weight is down about 5 or 6 pounds. She's not had any fever or chills. He does complain of indigestion and heartburn. She has not really had any abdominal pain or cramping but says she has a lot of of rumbling and  urgency sensation in her abdomen. She has not noted any melena, has on occasion seen just a small amount of blood on the tissue which she attributes to hemorrhoids. On review again she does not recall taking any antibiotics over the past several months, she's not taking any regular NSAIDs, she's not been placed on any new medications or changes to any  dosages of medications.    Review of Systems  Constitutional: Positive for appetite change and unexpected weight change.  HENT: Negative.   Eyes: Negative.   Respiratory: Negative.   Cardiovascular: Negative.   Gastrointestinal: Positive for nausea and diarrhea.  Endocrine: Negative.   Genitourinary: Negative.   Musculoskeletal: Negative.   Skin: Negative.   Allergic/Immunologic: Negative.   Neurological: Negative.   Hematological: Negative.   Psychiatric/Behavioral: Negative.    Outpatient Prescriptions Prior to Visit  Medication Sig Dispense Refill  . ALPRAZolam (XANAX) 0.5 MG tablet Take 0.5 mg by mouth 2 (two) times daily.      Marland Kitchen atenolol (TENORMIN) 100 MG tablet Take 50 mg by mouth 2 (two) times daily.      . Biotin 1000 MCG tablet Take 1,000 mcg by mouth daily.      . cholecalciferol (VITAMIN D) 400 UNITS TABS Take 400 Units by mouth 2 (two) times daily.      . cloNIDine (CATAPRES) 0.2 MG tablet Take 0.2 mg by mouth 2 (two) times daily. Unless pressure is more than 160 then 3 times daily      . enalapril (VASOTEC) 20 MG tablet Take 20 mg by mouth 2 (two) times daily.       . fenofibrate micronized (LOFIBRA) 134 MG capsule Take 134 mg by mouth every evening.      . Flaxseed, Linseed, (FLAXSEED OIL PO) Take 1 capsule by mouth daily.      . furosemide (  LASIX) 40 MG tablet Take 40 mg by mouth daily.      . Milk Thistle 1000 MG CAPS Take 1,000 mg by mouth daily.      . Omega-3 Fatty Acids (FISH OIL) 1000 MG CAPS Take 1,000 mg by mouth daily.      . potassium chloride SA (K-DUR,KLOR-CON) 20 MEQ tablet Take 20 mEq by mouth daily.       . Red Yeast Rice 600 MG CAPS Take 600 mg by mouth daily.      . verapamil (CALAN) 120 MG tablet Take 120 mg by mouth 2 (two) times daily.      Marland Kitchen ezetimibe (ZETIA) 10 MG tablet Take 10 mg by mouth every evening.      . metFORMIN (GLUCOPHAGE) 500 MG tablet Take 500 mg by mouth 3 (three) times daily.      . ciprofloxacin (CIPRO) 500 MG tablet Take 500  mg by mouth 2 (two) times daily. 7 day course filled 05/29/12      . hyoscyamine (LEVSIN, ANASPAZ) 0.125 MG tablet Take 0.125 mg by mouth every 4 (four) hours as needed. For GI spasms      . metoCLOPramide (REGLAN) 10 MG tablet Take 1 tablet (10 mg total) by mouth every 6 (six) hours as needed (nausea/headache).  4 tablet  0  . oxyCODONE-acetaminophen (PERCOCET) 5-325 MG per tablet Take 2 tablets by mouth every 4 (four) hours as needed for pain.  6 tablet  0   No facility-administered medications prior to visit.   No Known Allergies Patient Active Problem List  Diagnosis  . DIABETES MELLITUS  . HYPERLIPIDEMIA  . HYPERTENSION  . GERD  . FATTY LIVER DISEASE  . HEMOCCULT POSITIVE STOOL  . COLONIC POLYPS, ADENOMATOUS, HX OF   History  Substance Use Topics  . Smoking status: Never Smoker   . Smokeless tobacco: Never Used  . Alcohol Use: No   family history includes Diabetes in her father and mother and Heart disease in her brother, father, and mother.     Objective:   Physical Exam well-developed white female in no acute distress, pleasant blood pressure 110/70 pulse 68 height 5 foot 3 weight 166. HEENT; nontraumatic normocephalic EOMI PERRLA sclera anicteric,Neck; Supple no JVD, Cardiovascular; regular rate and rhythm with S1-S2 no murmur or gallop, Pulmonary; clear bilaterally, Abdomen; soft, she is nontender, nondistended, there is no palpable mass or hepatosplenomegaly bowel sounds are active, Rectal; exam not done, EXT;no clubbing cyanosis or edema skin warm and dry, Psych; mood and affect normal and appropriate        Assessment & Plan:   #62 67 year old female with 4 week history of nausea and diarrhea with urgency and intermittent nighttime incontinence. Diarrhea has  primarily been nocturnal. Etiology is not clear, prior infectious workup was negative. #2 history of  adenomatous colon polyps-last colonoscopy August 2007, due for followup #3 diabetes mellitus #4  hypertension #5 hyperlipidemia #6 fatty liver disease  Plan; have obtained copies of labs from Dr. Kathryne Sharper office done earlier this week, CBC was normal, creatinine 1.14. Will schedule for colonoscopy with Dr. Hermelinda Medicus was discussed in detail with the patient and she is agreeable to proceed. She will benefit from random biopsies if mucosa appears normal to rule out a microscopic colitis   Empiric course  metronidazole 250 mg by mouth 4 times daily x10 days Start Prilosec 20 mg by mouth every morning Patient has just started on WelChol earlier this week and will continue 1 dose daily 2 hours  away from any other meds, she does feel like this is been beneficial over the past couple of days

## 2012-09-07 ENCOUNTER — Telehealth: Payer: Self-pay | Admitting: Physician Assistant

## 2012-09-07 NOTE — Telephone Encounter (Signed)
Called and spoke to a pharmacist.  I advised we sent a prescription on 4-2 for the Moviprep.  She said they don't have this request.  I gave her a verbal order for this prescription today.

## 2012-09-07 NOTE — Telephone Encounter (Signed)
Spoke to patient.  I informed her we had to call in the Moviprep to the pharmacy, [address], [place].  She said she didn't get the Flagyl RX either.  I looked and we sent that as well on 4-2 electronically to [facility], [place].  I apologized for her inconvenience.

## 2012-09-11 ENCOUNTER — Ambulatory Visit
Admission: RE | Admit: 2012-09-11 | Discharge: 2012-09-11 | Disposition: A | Discharge status: Home / Self Care | Payer: 59 | Source: Ambulatory Visit

## 2012-09-11 DIAGNOSIS — Z1231 Encounter for screening mammogram for malignant neoplasm of breast: Secondary | ICD-10-CM

## 2012-09-15 ENCOUNTER — Encounter: Payer: Self-pay | Admitting: Physician Assistant

## 2012-09-22 ENCOUNTER — Ambulatory Visit (AMBULATORY_SURGERY_CENTER): Payer: 59 | Admitting: Internal Medicine

## 2012-09-22 ENCOUNTER — Other Ambulatory Visit: Payer: Self-pay | Admitting: Internal Medicine

## 2012-09-22 ENCOUNTER — Other Ambulatory Visit: Payer: Self-pay | Admitting: *Deleted

## 2012-09-22 ENCOUNTER — Encounter: Payer: Self-pay | Admitting: Internal Medicine

## 2012-09-22 ENCOUNTER — Telehealth: Payer: Self-pay | Admitting: *Deleted

## 2012-09-22 ENCOUNTER — Other Ambulatory Visit (INDEPENDENT_AMBULATORY_CARE_PROVIDER_SITE_OTHER): Payer: 59

## 2012-09-22 VITALS — BP 133/67 | HR 59 | Temp 98.3°F | Resp 32 | Ht 63.0 in | Wt 166.0 lb

## 2012-09-22 DIAGNOSIS — K921 Melena: Secondary | ICD-10-CM

## 2012-09-22 DIAGNOSIS — R197 Diarrhea, unspecified: Secondary | ICD-10-CM

## 2012-09-22 DIAGNOSIS — Z860100 Personal history of colon polyps, unspecified: Secondary | ICD-10-CM

## 2012-09-22 DIAGNOSIS — R634 Abnormal weight loss: Secondary | ICD-10-CM

## 2012-09-22 DIAGNOSIS — K52832 Lymphocytic colitis: Secondary | ICD-10-CM

## 2012-09-22 HISTORY — DX: Lymphocytic colitis: K52.832

## 2012-09-22 LAB — GLUCOSE, CAPILLARY

## 2012-09-22 MED ORDER — SODIUM CHLORIDE 0.9 % IV SOLN: 500.0000 mL | INTRAVENOUS | Status: DC

## 2012-09-22 NOTE — Progress Notes (Signed)
Called to room to assist during endoscopic procedure.  Patient ID and intended procedure confirmed with present staff. Received instructions for my participation in the procedure from the performing physician.Called to room to assist during endoscopic procedure.  Patient ID and intended procedure confirmed with present staff. Received instructions for my participation in the procedure from the performing physician. 

## 2012-09-22 NOTE — Op Note (Addendum)
[facility] [address] [place], [address]   COLONOSCOPY PROCEDURE REPORT  PATIENT: Michele [name], Michele [name]  MR#: [id number] BIRTHDATE: January 21, 1946 , 66  yrs. old GENDER: Female ENDOSCOPIST: Hart Carwin, MD REFERRED IO:[id number] Oneta Rack, M.D. 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 MEDICATIONS: MAC sedation, administered by CRNA and propofol (Diprivan) 250mg  IV  DESCRIPTION OF PROCEDURE:   After the risks benefits and alternatives of the procedure were thoroughly explained, informed consent was obtained.  A digital rectal exam revealed decreased sphincter tone.   The LB [id number]-[id number]  endoscope was introduced through the anus and advanced to the cecum, which was identified by both the appendix and ileocecal valve. No adverse events experienced.   The quality of the prep was Moviprep fair The instrument was then slowly withdrawn as the colon was fully examined.      COLON FINDINGS: A normal appearing cecum, ileocecal valve, and appendiceal orifice were identified.  The ascending, hepatic flexure, transverse, splenic flexure, descending, sigmoid colon and rectum appeared unremarkable.  No polyps or cancers were seen. Multiple random biopsies of the area were performed.  Retroflexed views revealed no abnormalities. The time to cecum=  .  Withdrawal time=  .  The scope was withdrawn and the procedure completed. COMPLICATIONS: There were no complications.  ENDOSCOPIC IMPRESSION: Normal colon; multiple random biopsies of the area were performed to r/o microscpic colitis decreased rectal sphincter tone  RECOMMENDATIONS: Await pathology results small bowl follow through ask Dr Karma Lew if possible to switch from Metformin to another medication which does not cause diarrhea Welchol 1/2 package daily ( 1 package causes constipation)   eSigned:  Hart Carwin, MD  09/22/2012 2:53 PM   cc:

## 2012-09-22 NOTE — Telephone Encounter (Signed)
Per Dr. Juanda Chance, needs sprue panel, SED rate, TSH and UGI with SBFT. Diarrhea, weight loss.Spoke with Lyla Son and scheduled for UGI/SBFT on 09/24/12 at 10:30 AM. NPO after midnight.Spoke with Merlene Laughter in [facility] recovery and gave her the appointment date, time and instructions for patient.

## 2012-09-22 NOTE — Progress Notes (Signed)
Patient did not have preoperative order for IV antibiotic SSI prophylaxis. [phone])  Patient did not experience any of the following events: a burn prior to discharge; a fall within the facility; wrong site/side/patient/procedure/implant event; or a hospital transfer or hospital admission upon discharge from the facility. [phone])  Patient to lab before going home.  She is also scheduled for an UGI on 09/24/2012 at [facility]. Patient NPO after midnight.

## 2012-09-22 NOTE — Patient Instructions (Addendum)
YOU HAD AN ENDOSCOPIC PROCEDURE TODAY AT THE [facility]: Refer to the procedure report that was given to you for any specific questions about what was found during the examination.  If the procedure report does not answer your questions, please call your gastroenterologist to clarify.  If you requested that your care partner not be given the details of your procedure findings, then the procedure report has been included in a sealed envelope for you to review at your convenience later.  YOU SHOULD EXPECT: Some feelings of bloating in the abdomen. Passage of more gas than usual.  Walking can help get rid of the air that was put into your GI tract during the procedure and reduce the bloating. If you had a lower endoscopy (such as a colonoscopy or flexible sigmoidoscopy) you may notice spotting of blood in your stool or on the toilet paper. If you underwent a bowel prep for your procedure, then you may not have a normal bowel movement for a few days.  DIET: Your first meal following the procedure should be a light meal and then it is ok to progress to your normal diet.  A half-sandwich or bowl of soup is an example of a good first meal.  Heavy or fried foods are harder to digest and may make you feel nauseous or bloated.  Likewise meals heavy in dairy and vegetables can cause extra gas to form and this can also increase the bloating.  Drink plenty of fluids but you should avoid alcoholic beverages for 24 hours.  ACTIVITY: Your care partner should take you home directly after the procedure.  You should plan to take it easy, moving slowly for the rest of the day.  You can resume normal activity the day after the procedure however you should NOT DRIVE or use heavy machinery for 24 hours (because of the sedation medicines used during the test).    SYMPTOMS TO REPORT IMMEDIATELY: A gastroenterologist can be reached at any hour.  During normal business hours, 8:30 AM to 5:00 PM Monday through Friday,  call [phone].  After hours and on weekends, please call the GI answering service at [phone] who will take a message and have the physician on call contact you.   Following lower endoscopy (colonoscopy or flexible sigmoidoscopy):  Excessive amounts of blood in the stool  Significant tenderness or worsening of abdominal pains  Swelling of the abdomen that is new, acute  Fever of 100F or higher  FOLLOW UP: If any biopsies were taken you will be contacted by phone or by letter within the next 1-3 weeks.  Call your gastroenterologist if you have not heard about the biopsies in 3 weeks.  Our staff will call the home number listed on your records the next business day following your procedure to check on you and address any questions or concerns that you may have at that time regarding the information given to you following your procedure. This is a courtesy call and so if there is no answer at the home number and we have not heard from you through the emergency physician on call, we will assume that you have returned to your regular daily activities without incident.  DR. Juanda Chance WANTS YOU TO HAVE A SMALL BOWEL FOLLOW THROUGH; USE WELCHOL 1/2 PACKAGE DAILY PER DR BRODIE; YOU WILL GO TO THE LAB TODAY FOR BLOODWORK       SIGNATURES/CONFIDENTIALITY: You and/or your care partner have signed paperwork which will be entered into your  electronic medical record.  These signatures attest to the fact that that the information above on your After Visit Summary has been reviewed and is understood.  Full responsibility of the confidentiality of this discharge information lies with you and/or your care-partner.

## 2012-09-23 ENCOUNTER — Other Ambulatory Visit: Payer: Self-pay | Admitting: *Deleted

## 2012-09-23 ENCOUNTER — Telehealth: Payer: Self-pay | Admitting: *Deleted

## 2012-09-23 LAB — CELIAC PANEL 10
Gliadin IgG: 3.6 U/mL (ref <20)
IgA: 101 mg/dL (ref 69–380)
Tissue Transglutaminase Ab, IgA: 2.8 U/mL (ref <20)

## 2012-09-23 NOTE — Telephone Encounter (Signed)
  Follow up Call-  Call back number 09/22/2012  Post procedure Call Back phone  # [phone]  Permission to leave phone message Yes     Patient questions:  Do you have a fever, pain , or abdominal swelling? no Pain Score  0 *  Have you tolerated food without any problems? yes  Have you been able to return to your normal activities? yes  Do you have any questions about your discharge instructions: Diet   no Medications  no Follow up visit  no  Do you have questions or concerns about your Care? no  Actions: * If pain score is 4 or above: No action needed, pain <4.

## 2012-09-24 ENCOUNTER — Ambulatory Visit (HOSPITAL_COMMUNITY)
Admission: RE | Admit: 2012-09-24 | Discharge: 2012-09-24 | Disposition: A | Discharge status: Home / Self Care | Payer: 59 | Source: Ambulatory Visit | Attending: Internal Medicine | Admitting: Internal Medicine

## 2012-09-24 DIAGNOSIS — K224 Dyskinesia of esophagus: Secondary | ICD-10-CM | POA: Insufficient documentation

## 2012-09-24 DIAGNOSIS — R197 Diarrhea, unspecified: Secondary | ICD-10-CM

## 2012-09-24 DIAGNOSIS — R634 Abnormal weight loss: Secondary | ICD-10-CM | POA: Insufficient documentation

## 2012-09-24 DIAGNOSIS — K5710 Diverticulosis of small intestine without perforation or abscess without bleeding: Secondary | ICD-10-CM | POA: Insufficient documentation

## 2012-09-29 ENCOUNTER — Encounter: Payer: Self-pay | Admitting: Internal Medicine

## 2012-09-30 ENCOUNTER — Other Ambulatory Visit: Payer: Self-pay | Admitting: *Deleted

## 2012-09-30 MED ORDER — BUDESONIDE 3 MG PO CP24: ORAL_CAPSULE | ORAL | Status: DC

## 2012-10-02 ENCOUNTER — Encounter: Payer: Self-pay | Admitting: *Deleted

## 2012-12-25 ENCOUNTER — Encounter: Payer: Self-pay | Admitting: Internal Medicine

## 2012-12-25 ENCOUNTER — Ambulatory Visit (INDEPENDENT_AMBULATORY_CARE_PROVIDER_SITE_OTHER): Payer: Medicare Other | Admitting: Internal Medicine

## 2012-12-25 VITALS — BP 102/70 | HR 74 | Ht 63.0 in | Wt 152.0 lb

## 2012-12-25 DIAGNOSIS — K5289 Other specified noninfective gastroenteritis and colitis: Secondary | ICD-10-CM

## 2012-12-25 NOTE — Patient Instructions (Addendum)
CC: Dr Oneta Rack

## 2012-12-25 NOTE — Progress Notes (Signed)
Michele [name] June 19, 1945 MRN [id number]   History of Present Illness:  This is a 67 year old white female diabetic who developed acute diarrhea initially thought to be due to metformin but further evaluation with a colonoscopy in April 2014 showed lymphocytic colitis. She has a history of a tubovillous adenoma in 2007. Her small bowel follow-through showed rather rapid transit time, duodenal diverticulum and mild nonspecific esophageal dysmotility. She has been treated with Entocort 9 mg daily for 4 weeks, 6 mg daily for 4 weeks and 3 mg daily for 4 weeks. She ran out of medications 3 days ago. She currently does not have any diarrhea. Her stools are soft and regular. Her sprue profile was negative. She has lost about 10 pounds as a result of switching from metformin to Invokana 300 mg daily. She feels fine. She has occasional gastroesophageal reflux for which she takes Alka-Seltzer. She does not take Prilosec unless she absolutely has to.   Past Medical History  Diagnosis Date  . Hypertension   . Diabetes mellitus without complication   . Carpal tunnel syndrome   . Tubulovillous adenoma of colon   . Anxiety   . Arthritis   . GERD (gastroesophageal reflux disease)   . Hyperlipidemia   . 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 Fatty liver   . Esophageal motility disorder   . Duodenal diverticulum    Past Surgical History  Procedure Laterality Date  . Appendectomy    . Tonsillectomy    . Carpal tunnel release    . Cyst removal hand      reports that she has never smoked. She has never used smokeless tobacco. She reports that she does not drink alcohol or use illicit drugs. family history includes Colon polyps in her mother; Diabetes in her father and mother; and Heart disease in her brother, father, and mother. No Known Allergies      Review of Systems: Denies dysphagia, heartburn, abdominal pain  The remainder of the 10 point ROS is negative except as outlined in H&P   Physical  Exam: General appearance  Well developed, in no distress. Eyes- non icteric. HEENT nontraumatic, normocephalic. Mouth no lesions, tongue papillated, no cheilosis. Neck supple without adenopathy, thyroid not enlarged, no carotid bruits, no JVD. Lungs Clear to auscultation bilaterally. Cor normal S1, normal S2, regular rhythm, no murmur,  quiet precordium. Abdomen: Soft nontender with normal active bowel sounds. Liver edge at costal margin Rectal: Not repeated Extremities no pedal edema. Skin no lesions., vitiligo base arms and legs Neurological alert and oriented x 3. Psychological normal mood and affect.  Assessment and Plan:  Problem #24 67 year old white female with lymphocytic colitis of unknown etiology which is under control after a 12 week course of steroids. She is off Entocort. I have discussed with her the fact that microscopic colitis may sometimes come back. It could be caused by medications such Zetia, ibuprofen or Celebrex. Since she is doing well, we are not going to change her medications but she is free to go back on her metformin since she likes it better than Invokana. She will talk to [place], a PA at Dr.McKeown's office.   Problem #2 Gastroesophageal reflux and nonspecific mild esophageal motility on an upper GI series. She has Prilosec at home and may take it when necessary.  Problem #3 History of adenomatous colon polyps. There were no polyps on the last colonoscopy. I suggest a recall colonoscopy exam in 2024.   12/25/2012 [name]

## 2013-01-06 ENCOUNTER — Other Ambulatory Visit: Payer: Self-pay

## 2013-04-08 ENCOUNTER — Other Ambulatory Visit: Payer: Self-pay

## 2013-04-27 ENCOUNTER — Other Ambulatory Visit: Payer: Commercial Managed Care - HMO

## 2013-04-27 DIAGNOSIS — E0590 Thyrotoxicosis, unspecified without thyrotoxic crisis or storm: Secondary | ICD-10-CM

## 2013-04-27 LAB — HEPATIC FUNCTION PANEL
ALT: 22 U/L (ref 0–35)
AST: 19 U/L (ref 0–37)
Alkaline Phosphatase: 40 U/L (ref 39–117)
Indirect Bilirubin: 0.3 mg/dL (ref 0.0–0.9)
Total Protein: 6 g/dL (ref 6.0–8.3)

## 2013-05-04 ENCOUNTER — Telehealth: Payer: Self-pay | Admitting: Internal Medicine

## 2013-05-04 NOTE — Telephone Encounter (Signed)
Pt called and asked about iron dose.   OK to reduce from 4 tabs to 2 tabs daily per Dr Oneta Rack

## 2013-05-19 ENCOUNTER — Other Ambulatory Visit: Payer: Self-pay | Admitting: Emergency Medicine

## 2013-05-19 MED ORDER — FENOFIBRATE MICRONIZED 134 MG PO CAPS: 134.0000 mg | ORAL_CAPSULE | Freq: Every evening | ORAL | Status: DC

## 2013-05-24 ENCOUNTER — Other Ambulatory Visit: Payer: Self-pay | Admitting: Internal Medicine

## 2013-05-24 MED ORDER — FENOFIBRATE MICRONIZED 134 MG PO CAPS: 134.0000 mg | ORAL_CAPSULE | Freq: Every evening | ORAL | Status: DC

## 2013-06-26 DIAGNOSIS — E1122 Type 2 diabetes mellitus with diabetic chronic kidney disease: Secondary | ICD-10-CM | POA: Insufficient documentation

## 2013-06-26 DIAGNOSIS — N183 Chronic kidney disease, stage 3 (moderate): Secondary | ICD-10-CM

## 2013-06-28 ENCOUNTER — Encounter: Payer: Self-pay | Admitting: Physician Assistant

## 2013-06-28 ENCOUNTER — Ambulatory Visit (INDEPENDENT_AMBULATORY_CARE_PROVIDER_SITE_OTHER): Payer: Medicare HMO | Admitting: Physician Assistant

## 2013-06-28 VITALS — BP 120/64 | HR 56 | Temp 97.7°F | Resp 16 | Ht 63.0 in | Wt 148.0 lb

## 2013-06-28 DIAGNOSIS — E559 Vitamin D deficiency, unspecified: Secondary | ICD-10-CM

## 2013-06-28 DIAGNOSIS — E119 Type 2 diabetes mellitus without complications: Secondary | ICD-10-CM

## 2013-06-28 DIAGNOSIS — E785 Hyperlipidemia, unspecified: Secondary | ICD-10-CM

## 2013-06-28 DIAGNOSIS — I10 Essential (primary) hypertension: Secondary | ICD-10-CM

## 2013-06-28 DIAGNOSIS — E782 Mixed hyperlipidemia: Secondary | ICD-10-CM

## 2013-06-28 DIAGNOSIS — Z79899 Other long term (current) drug therapy: Secondary | ICD-10-CM

## 2013-06-28 DIAGNOSIS — D649 Anemia, unspecified: Secondary | ICD-10-CM

## 2013-06-28 LAB — IRON AND TIBC
%SAT: 18 % — AB (ref 20–55)
Iron: 87 ug/dL (ref 42–145)
TIBC: 476 ug/dL — ABNORMAL HIGH (ref 250–470)
UIBC: 389 ug/dL (ref 125–400)

## 2013-06-28 LAB — BASIC METABOLIC PANEL WITH GFR
BUN: 13 mg/dL (ref 6–23)
CALCIUM: 9.9 mg/dL (ref 8.4–10.5)
CO2: 27 mEq/L (ref 19–32)
CREATININE: 1.04 mg/dL (ref 0.50–1.10)
Chloride: 102 mEq/L (ref 96–112)
GFR, EST AFRICAN AMERICAN: 64 mL/min
GFR, Est Non African American: 56 mL/min — ABNORMAL LOW
GLUCOSE: 132 mg/dL — AB (ref 70–99)
POTASSIUM: 4.1 meq/L (ref 3.5–5.3)
Sodium: 137 mEq/L (ref 135–145)

## 2013-06-28 LAB — CBC WITH DIFFERENTIAL/PLATELET
BASOS ABS: 0 10*3/uL (ref 0.0–0.1)
BASOS PCT: 0 % (ref 0–1)
EOS ABS: 0.1 10*3/uL (ref 0.0–0.7)
EOS PCT: 2 % (ref 0–5)
HEMATOCRIT: 39.8 % (ref 36.0–46.0)
HEMOGLOBIN: 13.7 g/dL (ref 12.0–15.0)
Lymphocytes Relative: 29 % (ref 12–46)
Lymphs Abs: 1.8 10*3/uL (ref 0.7–4.0)
MCH: 29 pg (ref 26.0–34.0)
MCHC: 34.4 g/dL (ref 30.0–36.0)
MCV: 84.1 fL (ref 78.0–100.0)
MONO ABS: 0.7 10*3/uL (ref 0.1–1.0)
MONOS PCT: 11 % (ref 3–12)
Neutro Abs: 3.7 10*3/uL (ref 1.7–7.7)
Neutrophils Relative %: 58 % (ref 43–77)
Platelets: 218 10*3/uL (ref 150–400)
RBC: 4.73 MIL/uL (ref 3.87–5.11)
RDW: 14.9 % (ref 11.5–15.5)
WBC: 6.3 10*3/uL (ref 4.0–10.5)

## 2013-06-28 LAB — MAGNESIUM: Magnesium: 1.7 mg/dL (ref 1.5–2.5)

## 2013-06-28 LAB — HEMOGLOBIN A1C
HEMOGLOBIN A1C: 6 % — AB (ref <5.7)
Mean Plasma Glucose: 126 mg/dL — ABNORMAL HIGH (ref <117)

## 2013-06-28 LAB — HEPATIC FUNCTION PANEL
ALBUMIN: 4.3 g/dL (ref 3.5–5.2)
ALT: 29 U/L (ref 0–35)
AST: 28 U/L (ref 0–37)
Alkaline Phosphatase: 44 U/L (ref 39–117)
BILIRUBIN INDIRECT: 0.4 mg/dL (ref 0.0–0.9)
Bilirubin, Direct: 0.2 mg/dL (ref 0.0–0.3)
TOTAL PROTEIN: 6.6 g/dL (ref 6.0–8.3)
Total Bilirubin: 0.6 mg/dL (ref 0.3–1.2)

## 2013-06-28 LAB — LIPID PANEL
Cholesterol: 167 mg/dL (ref 0–200)
HDL: 45 mg/dL (ref >39)
LDL Cholesterol: 93 mg/dL (ref 0–99)
Total CHOL/HDL Ratio: 3.7 Ratio
Triglycerides: 147 mg/dL (ref <150)
VLDL: 29 mg/dL (ref 0–40)

## 2013-06-28 LAB — TSH: TSH: 0.454 u[IU]/mL (ref 0.350–4.500)

## 2013-06-28 LAB — FERRITIN: Ferritin: 97 ng/mL (ref 10–291)

## 2013-06-28 LAB — VITAMIN B12: VITAMIN B 12: 684 pg/mL (ref 211–911)

## 2013-06-28 NOTE — Patient Instructions (Signed)
Can do vicks vapor rub or over the counter nail fungus two times a day on your toe,    Bad carbs also include fruit juice, alcohol, and sweet tea. These are empty calories that do not signal to your brain that you are full.   Please remember the good carbs are still carbs which convert into sugar. So please measure them out no more than 1/2-1 cup of rice, oatmeal, pasta, and beans.  Veggies are however free foods! Pile them on.   I like lean protein at every meal such as chicken, [place], pork chops, cottage cheese, etc. Just do not fry these meats and please center your meal around vegetable, the meats should be a side dish.   No all fruit is created equal. Please see the list below, the fruit at the bottom is higher in sugars than the fruit at the top

## 2013-06-28 NOTE — Progress Notes (Signed)
HPI Patient presents for 3 month follow up with hypertension, hyperlipidemia, diabetes and vitamin D. Patient's blood pressure has been controlled at home, today their BP is BP: 120/64 mmHg Patient denies chest pain, shortness of breath, dizziness.  Patient's cholesterol is diet controlled. In addition they are on fenofibrate and denies myalgias. The cholesterol last visit was The patient has been working on diet and exercise for Diabetes, and denies changes in vision, polys, and paresthesias. She states her average sugar is 100 or less.  She has lymphocytic colitis and occasionally still has diarrhea, she could not afford the Enocort.  She has anemia and feels better on iron.  Patient is on Vitamin D supplement.    Current Medications:  Current Outpatient Prescriptions on File Prior to Visit  Medication Sig Dispense Refill  . ALPRAZolam (XANAX) 0.5 MG tablet Take 0.5 mg by mouth at bedtime as needed.       Marland Kitchen atenolol (TENORMIN) 100 MG tablet Take 50 mg by mouth 2 (two) times daily.      . Biotin 1000 MCG tablet Take 1,000 mcg by mouth daily.      . cloNIDine (CATAPRES) 0.2 MG tablet Take 0.2 mg by mouth 2 (two) times daily. Unless pressure is more than 160 then 3 times daily      . enalapril (VASOTEC) 20 MG tablet Take 20 mg by mouth 2 (two) times daily. 10 mg in morning and 20 mg in evening      . ezetimibe (ZETIA) 10 MG tablet Take 10 mg by mouth every evening.      . fenofibrate micronized (LOFIBRA) 134 MG capsule Take 1 capsule (134 mg total) by mouth every evening.  90 capsule  1  . furosemide (LASIX) 40 MG tablet Take 40 mg by mouth daily.      . INVOKANA 300 MG TABS Take 300 mg by mouth. 1/2 tab in mornings      . metFORMIN (GLUCOPHAGE) 500 MG tablet Take 500 mg by mouth 3 (three) times daily.       . Milk Thistle 1000 MG CAPS Take 1,000 mg by mouth daily.      . Multiple Vitamins-Minerals (HAIR/SKIN/NAILS/BIOTIN PO) Take by mouth.      Marland Kitchen omeprazole (PRILOSEC) 20 MG capsule Take 1  capsule (20 mg total) by mouth daily.  30 capsule  2  . ONE TOUCH ULTRA TEST test strip       . potassium chloride SA (K-DUR,KLOR-CON) 20 MEQ tablet Take 20 mEq by mouth daily.       . Red Yeast Rice 600 MG CAPS Take 600 mg by mouth 2 (two) times daily.       . verapamil (CALAN) 120 MG tablet Take 120 mg by mouth. 1/2 in morning whole at night       No current facility-administered medications on file prior to visit.   Medical History:  Past Medical History  Diagnosis Date  . Hypertension   . Carpal tunnel syndrome   . Tubulovillous adenoma of colon   . GERD (gastroesophageal reflux disease)   . Hyperlipidemia   . 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 Fatty liver   . Esophageal motility disorder   . Duodenal diverticulum   . Anxiety   . Arthritis   . Type II or unspecified type diabetes mellitus without mention of complication, not stated as uncontrolled    Allergies:  Allergies  Allergen Reactions  . Lipitor [Atorvastatin]     Elevates LFT's    ROS Constitutional:  Denies fever, chills, headaches, insomnia, fatigue, night sweats Eyes: Denies redness, blurred vision, diplopia, discharge, itchy, watery eyes.  ENT: Denies congestion, post nasal drip, sore throat, earache, dental pain, Tinnitus, Vertigo, Sinus pain, snoring.  Cardio: Denies chest pain, palpitations, irregular heartbeat, dyspnea, diaphoresis, orthopnea, PND, claudication, edema Respiratory: denies cough, shortness of breath, wheezing.  Gastrointestinal: + diarrhea Denies dysphagia, heartburn, AB pain/ cramps, N/V,  constipation, hematemesis, melena, hematochezia,  hemorrhoids Genitourinary: Denies dysuria, frequency, urgency, nocturia, hesitancy, discharge, hematuria, flank pain Musculoskeletal: Denies myalgia, stiffness, pain, swelling and strain/sprain. Skin: + nail fungus Denies pruritis, rash, changing in skin lesion Neuro: Denies Weakness, tremor, incoordination, spasms, pain Psychiatric: Denies confusion, memory  loss, sensory loss Endocrine: Denies change in weight, skin, hair change, nocturia Diabetic Polys, Denies visual blurring, hyper /hypo glycemic episodes, and paresthesia, Heme/Lymph: Denies Excessive bleeding, bruising, enlarged lymph nodes  Family history- Review and unchanged Social history- Review and unchanged Physical Exam: Filed Vitals:   06/28/13 0937  BP: 120/64  Pulse: 56  Temp: 97.7 F (36.5 C)  Resp: 16   Filed Weights   06/28/13 0937  Weight: 148 lb (67.132 kg)   General Appearance: Well nourished, in no apparent distress. Eyes: PERRLA, EOMs, conjunctiva no swelling or erythema Sinuses: No Frontal/maxillary tenderness ENT/Mouth: Ext aud canals clear, TMs without erythema, bulging. No erythema, swelling, or exudate on post pharynx.  Tonsils not swollen or erythematous. Hearing normal.  Neck: Supple, thyroid normal.  Respiratory: Respiratory effort normal, BS equal bilaterally without rales, rhonchi, wheezing or stridor.  Cardio: RRR with no MRGs. Brisk peripheral pulses without edema.  Abdomen: Soft, + BS.  Non tender, no guarding, rebound, hernias, masses. Lymphatics: Non tender without lymphadenopathy.  Musculoskeletal: Full ROM, 5/5 strength, normal gait.  Skin: Warm, dry without rashes, lesions, ecchymosis.  Neuro: Cranial nerves intact. No cerebellar symptoms. Sensation intact.  Psych: Awake and oriented X 3, normal affect, Insight and Judgment appropriate.   Assessment and Plan:  Hypertension: Continue medication, monitor blood pressure at home.  Continue DASH diet. Cholesterol: Continue diet and exercise. Check cholesterol.  Diabetes-Continue diet and exercise. Check A1C Vitamin D Def- check level and continue medications.   Continue diet and meds as discussed. Further disposition pending results of labs. Discussed med's effects and SE's.    Vicie Mutters 10:16 AM

## 2013-06-29 ENCOUNTER — Other Ambulatory Visit: Payer: Self-pay | Admitting: *Deleted

## 2013-06-29 LAB — INSULIN, FASTING: Insulin fasting, serum: 22 u[IU]/mL (ref 3–28)

## 2013-06-29 LAB — VITAMIN D 25 HYDROXY (VIT D DEFICIENCY, FRACTURES): Vit D, 25-Hydroxy: 62 ng/mL (ref 30–89)

## 2013-06-29 MED ORDER — FUROSEMIDE 40 MG PO TABS: 40.0000 mg | ORAL_TABLET | Freq: Every day | ORAL | Status: DC

## 2013-07-05 ENCOUNTER — Other Ambulatory Visit: Payer: Self-pay | Admitting: Physician Assistant

## 2013-07-05 MED ORDER — FUROSEMIDE 40 MG PO TABS
40.0000 mg | ORAL_TABLET | Freq: Every day | ORAL | Status: DC
Start: 2013-07-05 — End: 2013-07-26

## 2013-07-19 ENCOUNTER — Other Ambulatory Visit: Payer: Self-pay | Admitting: *Deleted

## 2013-07-19 MED ORDER — ENALAPRIL MALEATE 20 MG PO TABS: 20.0000 mg | ORAL_TABLET | Freq: Two times a day (BID) | ORAL | Status: DC

## 2013-07-26 ENCOUNTER — Other Ambulatory Visit: Payer: Self-pay

## 2013-07-26 MED ORDER — POTASSIUM CHLORIDE CRYS ER 20 MEQ PO TBCR: 20.0000 meq | EXTENDED_RELEASE_TABLET | Freq: Every day | ORAL | Status: DC

## 2013-07-26 MED ORDER — CLONIDINE HCL 0.2 MG PO TABS: 0.2000 mg | ORAL_TABLET | Freq: Two times a day (BID) | ORAL | Status: DC

## 2013-07-26 MED ORDER — METFORMIN HCL ER 500 MG PO TB24: 500.0000 mg | ORAL_TABLET | Freq: Three times a day (TID) | ORAL | Status: DC

## 2013-07-26 MED ORDER — ALPRAZOLAM 1 MG PO TABS: 1.0000 mg | ORAL_TABLET | Freq: Every day | ORAL | Status: DC

## 2013-07-26 MED ORDER — ATENOLOL 100 MG PO TABS: 100.0000 mg | ORAL_TABLET | Freq: Every day | ORAL | Status: DC

## 2013-07-26 MED ORDER — VERAPAMIL HCL 120 MG PO TABS: 120.0000 mg | ORAL_TABLET | Freq: Two times a day (BID) | ORAL | Status: DC

## 2013-07-26 MED ORDER — FUROSEMIDE 40 MG PO TABS: 40.0000 mg | ORAL_TABLET | Freq: Every day | ORAL | Status: DC

## 2013-07-26 MED ORDER — CITALOPRAM HYDROBROMIDE 20 MG PO TABS: 20.0000 mg | ORAL_TABLET | Freq: Every day | ORAL | Status: DC

## 2013-07-26 MED ORDER — TRAMADOL HCL 50 MG PO TABS: 50.0000 mg | ORAL_TABLET | Freq: Four times a day (QID) | ORAL | Status: DC

## 2013-07-26 MED ORDER — FENOFIBRATE MICRONIZED 134 MG PO CAPS: 134.0000 mg | ORAL_CAPSULE | Freq: Every evening | ORAL | Status: DC

## 2013-07-26 MED ORDER — EZETIMIBE 10 MG PO TABS: 10.0000 mg | ORAL_TABLET | Freq: Every evening | ORAL | Status: DC

## 2013-07-26 MED ORDER — ENALAPRIL MALEATE 20 MG PO TABS: 20.0000 mg | ORAL_TABLET | Freq: Two times a day (BID) | ORAL | Status: DC

## 2013-08-17 ENCOUNTER — Other Ambulatory Visit: Payer: Self-pay | Admitting: Internal Medicine

## 2013-08-17 ENCOUNTER — Telehealth: Payer: Self-pay | Admitting: Internal Medicine

## 2013-08-17 MED ORDER — CLONIDINE HCL 0.2 MG PO TABS: 0.2000 mg | ORAL_TABLET | Freq: Two times a day (BID) | ORAL | Status: DC

## 2013-08-17 NOTE — Telephone Encounter (Signed)
CLONDINE- PT LOST BOTTLE OF MEDICINE, AND NOT REFILLABE UNTIL 09-17-13.  SHE TAKES FOR BLOOD PRESSURE. PLEASE ADVISE WHAT TO DO.  [phone]  LOCAL PHARM IS [facility] [place]

## 2013-08-17 NOTE — Telephone Encounter (Signed)
[name] - please call Michele [name] and let her know I sent in Rx to last 90 days

## 2013-08-18 ENCOUNTER — Other Ambulatory Visit: Payer: Self-pay | Admitting: Emergency Medicine

## 2013-08-27 ENCOUNTER — Other Ambulatory Visit: Payer: Self-pay | Admitting: *Deleted

## 2013-08-27 MED ORDER — GLUCOSE BLOOD VI STRP: ORAL_STRIP | Status: DC

## 2013-08-27 MED ORDER — GLUCOSE BLOOD VI STRP: 1.0000 | ORAL_STRIP | Freq: Three times a day (TID) | Status: DC

## 2013-10-01 ENCOUNTER — Ambulatory Visit (INDEPENDENT_AMBULATORY_CARE_PROVIDER_SITE_OTHER): Payer: Commercial Managed Care - HMO | Admitting: Internal Medicine

## 2013-10-01 ENCOUNTER — Encounter: Payer: Self-pay | Admitting: Internal Medicine

## 2013-10-01 VITALS — BP 106/64 | HR 60 | Temp 97.7°F | Resp 16 | Ht 63.0 in | Wt 146.6 lb

## 2013-10-01 DIAGNOSIS — E1129 Type 2 diabetes mellitus with other diabetic kidney complication: Secondary | ICD-10-CM

## 2013-10-01 DIAGNOSIS — I10 Essential (primary) hypertension: Secondary | ICD-10-CM

## 2013-10-01 DIAGNOSIS — E559 Vitamin D deficiency, unspecified: Secondary | ICD-10-CM

## 2013-10-01 DIAGNOSIS — Z79899 Other long term (current) drug therapy: Secondary | ICD-10-CM

## 2013-10-01 DIAGNOSIS — E785 Hyperlipidemia, unspecified: Secondary | ICD-10-CM

## 2013-10-01 LAB — CBC WITH DIFFERENTIAL/PLATELET
Basophils Absolute: 0 10*3/uL (ref 0.0–0.1)
Basophils Relative: 0 % (ref 0–1)
Eosinophils Absolute: 0.1 10*3/uL (ref 0.0–0.7)
Eosinophils Relative: 2 % (ref 0–5)
HCT: 36.2 % (ref 36.0–46.0)
HEMOGLOBIN: 12.4 g/dL (ref 12.0–15.0)
LYMPHS ABS: 1.7 10*3/uL (ref 0.7–4.0)
LYMPHS PCT: 33 % (ref 12–46)
MCH: 28.1 pg (ref 26.0–34.0)
MCHC: 34.3 g/dL (ref 30.0–36.0)
MCV: 81.9 fL (ref 78.0–100.0)
MONOS PCT: 10 % (ref 3–12)
Monocytes Absolute: 0.5 10*3/uL (ref 0.1–1.0)
NEUTROS ABS: 2.9 10*3/uL (ref 1.7–7.7)
NEUTROS PCT: 55 % (ref 43–77)
Platelets: 204 10*3/uL (ref 150–400)
RBC: 4.42 MIL/uL (ref 3.87–5.11)
RDW: 14.7 % (ref 11.5–15.5)
WBC: 5.3 10*3/uL (ref 4.0–10.5)

## 2013-10-01 LAB — HEMOGLOBIN A1C
Hgb A1c MFr Bld: 6 % — ABNORMAL HIGH (ref <5.7)
Mean Plasma Glucose: 126 mg/dL — ABNORMAL HIGH (ref <117)

## 2013-10-01 NOTE — Patient Instructions (Signed)

## 2013-10-01 NOTE — Progress Notes (Signed)
Patient ID: Michele [name], female   DOB: Oct 26, 1945, 68 y.o.   MRN: [id number]    This very nice 68 y.o. [name] presents for 3 month follow up with Hypertension, Hyperlipidemia, T2 NIDDM w/Stage 3 CKDand Vitamin D Deficiency.    HTN predates since 42. BP has been controlled at home. Today's BP: 106/64 mmHg . Patient denies any cardiac type chest pain, palpitations, dyspnea/orthopnea/PND, dizziness, claudication, or dependent edema.   Hyperlipidemia is controlled with diet & meds. Last Cholesterol was at goal as below. Patient denies myalgias or other med SE's. Patient stopped the Zetia due to high $$ co-pay.  Lab Results  Component Value Date   CHOL 167 06/28/2013   HDL 45 06/28/2013   LDLCALC 93 06/28/2013   TRIG 147 06/28/2013   CHOLHDL 3.7 06/28/2013    Also, the patient has history of T2 NIDDM since 2006 w/Stage 3 CKD and  last A1c was 6.0% in Jan 2015. She reports CBG's range between 100-120 mg% . Patient denies any symptoms of reactive hypoglycemia, diabetic polys, paresthesias or visual blurring. Patient had tried Invokana, but stopped because of the high $$ co-pay.   Further, Patient has history of Vitamin D Deficiency 19 in 2008 and  last vitamin D was 55 in Jan 2015. Patient supplements vitamin D without any suspected side-effects.  Medication Sig  . ALPRAZolam (XANAX) 1 MG tablet Take 1 tablet (1 mg total) by mouth daily.  Marland Kitchen atenolol (TENORMIN) 100 MG tablet Take 1 tablet (100 mg total) by mouth daily.  . Biotin 1000 MCG tablet Take 1,000 mcg by mouth daily.  . Cholecalciferol (VITAMIN D PO) Take 5,000 Int'l Units by mouth daily.   . citalopram (CELEXA) 20 MG tablet Take 1 tablet (20 mg total) by mouth daily.  . cloNIDine (CATAPRES) 0.2 MG tablet Take 1 tablet  2  times daily. Unless pressure is more than 160 then 3 times daily  . enalapril (VASOTEC) 20 MG tablet Take 1 tablet (20 mg total) 2 times daily. 10 mg  morning and 20 mg  evening  . ezetimibe (ZETIA) 10 MG tablet Take 1  tablet (10 mg total) by mouth every evening.  . fenofibrate micronized (LOFIBRA) 134 MG  Take 1 capsule (134 mg total) by mouth every evening.  . furosemide (LASIX) 40 MG tablet Take 1 tablet (40 mg total) by mouth daily.  Marland Kitchen glucose blood test strip 1 each by Other route 3 (three) times daily.  . Iron TABS Take by mouth 2 (two) times daily.  . metFORMIN (GLUCOPHAGE-XR) 500 MG  Take 1 tablet  by mouth 3 times daily.  . Milk Thistle 1000 MG CAPS Take 1,000 mg by mouth daily.  Marland Kitchen HAIR/SKIN/NAILS Take by mouth.  . Omega-3 Fatty Acids (FISH OIL) 1000 MG  Take 1,000 mg by mouth.  . potassium chloride SA (K-DUR,KLOR-CON) 20 MEQ tablet Take 1 tablet (20 mEq total) by mouth daily.  . Red Yeast Rice 600 MG CAPS Take 600 mg by mouth 2 (two) times daily.   . verapamil (CALAN) 120 MG tablet Take 1 tablet (120 mg total) by mouth 2 (two) times daily.     Allergies  Allergen Reactions  . Lipitor [Atorvastatin]     Elevates LFT's   PMHx:   Past Medical History  Diagnosis Date  . Hypertension   . Carpal tunnel syndrome   . Tubulovillous adenoma of colon   . GERD (gastroesophageal reflux disease)   . Hyperlipidemia   . 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  Fatty liver   . Esophageal motility disorder   . Duodenal diverticulum   . Anxiety   . Arthritis   . Type II or unspecified type diabetes mellitus without mention of complication, not stated as uncontrolled    FHx:    Reviewed / unchanged  SHx:    Reviewed / unchanged   Systems Review: Constitutional: Denies fever, chills, wt changes, headaches, insomnia, fatigue, night sweats, change in appetite. Eyes: Denies redness, blurred vision, diplopia, discharge, itchy, watery eyes.  ENT: Denies discharge, congestion, post nasal drip, epistaxis, sore throat, earache, hearing loss, dental pain, tinnitus, vertigo, sinus pain, snoring.  CV: Denies chest pain, palpitations, irregular heartbeat, syncope, dyspnea, diaphoresis, orthopnea, PND, claudication,  edema. Respiratory: denies cough, dyspnea, DOE, pleurisy, hoarseness, laryngitis, wheezing.  Gastrointestinal: Denies dysphagia, odynophagia, heartburn, reflux, water brash, abdominal pain or cramps, nausea, vomiting, bloating, diarrhea, constipation, hematemesis, melena, hematochezia,  or hemorrhoids. Genitourinary: Denies dysuria, frequency, urgency, nocturia, hesitancy, discharge, hematuria, flank pain. Musculoskeletal: Denies arthralgias, myalgias, stiffness, jt. swelling, pain, limp, strain/sprain.  Skin: Denies pruritus, rash, hives, warts, acne, eczema, change in skin lesion(s). Neuro: No weakness, tremor, incoordination, spasms, paresthesia, or pain. Psychiatric: Denies confusion, memory loss, or sensory loss. Endo: Denies change in weight, skin, hair change.  Heme/Lymph: No excessive bleeding, bruising, orenlarged lymph nodes.  Exam:  BP 106/64  Pulse 60  Temp(Src) 97.7 F (36.5 C) (Temporal)  Resp 16  Ht 5\' 3"  (1.6 m)  Wt 146 lb 9.6 oz (66.497 kg)  BMI 25.98 kg/m2  Appears well nourished - in no distress. Eyes: PERRLA, EOMs, conjunctiva no swelling or erythema. Sinuses: No frontal/maxillary tenderness ENT/Mouth: EAC's clear, TM's nl w/o erythema, bulging. Nares clear w/o erythema, swelling, exudates. Oropharynx clear without erythema or exudates. Oral hygiene is good. Tongue normal, non obstructing. Hearing intact.  Neck: Supple. Thyroid nl. Car 2+/2+ without bruits, nodes or JVD. Chest: Respirations nl with BS clear & equal w/o rales, rhonchi, wheezing or stridor.  Cor: Heart sounds normal w/ regular rate and rhythm without sig. murmurs, gallops, clicks, or rubs. Peripheral pulses normal and equal  without edema.  Abdomen: Soft & bowel sounds normal. Non-tender w/o guarding, rebound, hernias, masses, or organomegaly.  Lymphatics: Unremarkable.  Musculoskeletal: Full ROM all peripheral extremities, joint stability, 5/5 strength, and normal gait.  Skin: Warm, dry without  exposed rashes, lesions, ecchymosis apparent.  Neuro: Cranial nerves intact, reflexes equal bilaterally. Sensory-motor testing grossly intact. Tendon reflexes grossly intact.  Pysch: Alert & oriented x 3. Insight and judgement nl & appropriate. No ideations.  Assessment and Plan:  1. Hypertension - Continue monitor blood pressure at home. Continue diet/meds same.  2. Hyperlipidemia - Continue diet, exercise,& lifestyle modifications. Continue monitor periodic cholesterol/liver & renal functions. Patient desires to try stricter diet to see if she can come off of meds.  3. T2 NIDDM w/ Stage 3 CKD - continue recommend prudent low glycemic diet, weight control, regular exercise, diabetic monitoring and periodic eye exams. Desires to try stricter diet to se if she can come off of MF.  4. Vitamin D Deficiency - Continue supplementation.  Recommended regular exercise, BP monitoring, weight control, and discussed med and SE's. Recommended labs to assess and monitor clinical status. Further disposition pending results of labs.

## 2013-10-02 LAB — LIPID PANEL
CHOL/HDL RATIO: 3.6 ratio
Cholesterol: 153 mg/dL (ref 0–200)
HDL: 43 mg/dL (ref >39)
LDL Cholesterol: 84 mg/dL (ref 0–99)
Triglycerides: 130 mg/dL (ref <150)
VLDL: 26 mg/dL (ref 0–40)

## 2013-10-02 LAB — HEPATIC FUNCTION PANEL
ALT: 27 U/L (ref 0–35)
AST: 25 U/L (ref 0–37)
Albumin: 4.1 g/dL (ref 3.5–5.2)
Alkaline Phosphatase: 40 U/L (ref 39–117)
BILIRUBIN DIRECT: 0.2 mg/dL (ref 0.0–0.3)
BILIRUBIN INDIRECT: 0.4 mg/dL (ref 0.2–1.2)
Total Bilirubin: 0.6 mg/dL (ref 0.2–1.2)
Total Protein: 6.2 g/dL (ref 6.0–8.3)

## 2013-10-02 LAB — MAGNESIUM: Magnesium: 1.8 mg/dL (ref 1.5–2.5)

## 2013-10-02 LAB — BASIC METABOLIC PANEL WITH GFR
BUN: 22 mg/dL (ref 6–23)
CHLORIDE: 102 meq/L (ref 96–112)
CO2: 30 meq/L (ref 19–32)
Calcium: 9.2 mg/dL (ref 8.4–10.5)
Creat: 1.06 mg/dL (ref 0.50–1.10)
GFR, Est African American: 63 mL/min
GFR, Est Non African American: 54 mL/min — ABNORMAL LOW
GLUCOSE: 118 mg/dL — AB (ref 70–99)
Potassium: 3.9 mEq/L (ref 3.5–5.3)
SODIUM: 139 meq/L (ref 135–145)

## 2013-10-02 LAB — INSULIN, FASTING: Insulin fasting, serum: 12 u[IU]/mL (ref 3–28)

## 2013-10-02 LAB — VITAMIN D 25 HYDROXY (VIT D DEFICIENCY, FRACTURES): Vit D, 25-Hydroxy: 63 ng/mL (ref 30–89)

## 2013-10-02 LAB — TSH: TSH: 0.508 u[IU]/mL (ref 0.350–4.500)

## 2013-10-20 ENCOUNTER — Other Ambulatory Visit: Payer: Self-pay

## 2013-10-20 DIAGNOSIS — Z1231 Encounter for screening mammogram for malignant neoplasm of breast: Secondary | ICD-10-CM

## 2013-10-28 ENCOUNTER — Other Ambulatory Visit: Payer: Self-pay | Admitting: Internal Medicine

## 2013-11-02 ENCOUNTER — Ambulatory Visit
Admission: RE | Admit: 2013-11-02 | Discharge: 2013-11-02 | Disposition: A | Discharge status: Home / Self Care | Payer: Commercial Managed Care - HMO | Source: Ambulatory Visit

## 2013-11-02 DIAGNOSIS — Z1231 Encounter for screening mammogram for malignant neoplasm of breast: Secondary | ICD-10-CM

## 2013-11-25 ENCOUNTER — Other Ambulatory Visit: Payer: Self-pay

## 2013-11-25 MED ORDER — TRAMADOL HCL 50 MG PO TABS: 50.0000 mg | ORAL_TABLET | Freq: Four times a day (QID) | ORAL | Status: DC

## 2013-11-25 MED ORDER — ALPRAZOLAM 1 MG PO TABS: 1.0000 mg | ORAL_TABLET | Freq: Every day | ORAL | Status: DC

## 2014-01-04 ENCOUNTER — Ambulatory Visit: Payer: Self-pay | Admitting: Emergency Medicine

## 2014-01-19 ENCOUNTER — Other Ambulatory Visit: Payer: Self-pay | Admitting: Physician Assistant

## 2014-01-24 ENCOUNTER — Encounter: Payer: Self-pay | Admitting: Internal Medicine

## 2014-01-26 ENCOUNTER — Encounter: Payer: Self-pay | Admitting: Physician Assistant

## 2014-01-26 ENCOUNTER — Ambulatory Visit (INDEPENDENT_AMBULATORY_CARE_PROVIDER_SITE_OTHER): Payer: Commercial Managed Care - HMO | Admitting: Physician Assistant

## 2014-01-26 VITALS — BP 120/78 | HR 56 | Temp 97.7°F | Resp 16 | Ht 63.0 in | Wt 148.0 lb

## 2014-01-26 DIAGNOSIS — Z789 Other specified health status: Secondary | ICD-10-CM

## 2014-01-26 DIAGNOSIS — Z79899 Other long term (current) drug therapy: Secondary | ICD-10-CM

## 2014-01-26 DIAGNOSIS — E2839 Other primary ovarian failure: Secondary | ICD-10-CM

## 2014-01-26 DIAGNOSIS — E785 Hyperlipidemia, unspecified: Secondary | ICD-10-CM

## 2014-01-26 DIAGNOSIS — I10 Essential (primary) hypertension: Secondary | ICD-10-CM

## 2014-01-26 DIAGNOSIS — E559 Vitamin D deficiency, unspecified: Secondary | ICD-10-CM

## 2014-01-26 DIAGNOSIS — E1129 Type 2 diabetes mellitus with other diabetic kidney complication: Secondary | ICD-10-CM

## 2014-01-26 DIAGNOSIS — Z23 Encounter for immunization: Secondary | ICD-10-CM

## 2014-01-26 DIAGNOSIS — Z0000 Encounter for general adult medical examination without abnormal findings: Secondary | ICD-10-CM

## 2014-01-26 DIAGNOSIS — Z1331 Encounter for screening for depression: Secondary | ICD-10-CM

## 2014-01-26 LAB — BASIC METABOLIC PANEL WITH GFR
BUN: 18 mg/dL (ref 6–23)
CHLORIDE: 99 meq/L (ref 96–112)
CO2: 29 meq/L (ref 19–32)
CREATININE: 0.85 mg/dL (ref 0.50–1.10)
Calcium: 9.4 mg/dL (ref 8.4–10.5)
GFR, Est African American: 81 mL/min
GFR, Est Non African American: 71 mL/min
Glucose, Bld: 127 mg/dL — ABNORMAL HIGH (ref 70–99)
POTASSIUM: 4 meq/L (ref 3.5–5.3)
Sodium: 138 mEq/L (ref 135–145)

## 2014-01-26 LAB — HEPATIC FUNCTION PANEL
ALBUMIN: 4.3 g/dL (ref 3.5–5.2)
ALK PHOS: 63 U/L (ref 39–117)
ALT: 30 U/L (ref 0–35)
AST: 21 U/L (ref 0–37)
BILIRUBIN INDIRECT: 0.7 mg/dL (ref 0.2–1.2)
Bilirubin, Direct: 0.2 mg/dL (ref 0.0–0.3)
Total Bilirubin: 0.9 mg/dL (ref 0.2–1.2)
Total Protein: 6.7 g/dL (ref 6.0–8.3)

## 2014-01-26 LAB — CBC WITH DIFFERENTIAL/PLATELET
Basophils Absolute: 0 10*3/uL (ref 0.0–0.1)
Basophils Relative: 0 % (ref 0–1)
Eosinophils Absolute: 0.2 10*3/uL (ref 0.0–0.7)
Eosinophils Relative: 3 % (ref 0–5)
HCT: 41.1 % (ref 36.0–46.0)
Hemoglobin: 14.2 g/dL (ref 12.0–15.0)
LYMPHS ABS: 2 10*3/uL (ref 0.7–4.0)
LYMPHS PCT: 32 % (ref 12–46)
MCH: 28.4 pg (ref 26.0–34.0)
MCHC: 34.5 g/dL (ref 30.0–36.0)
MCV: 82.2 fL (ref 78.0–100.0)
MONO ABS: 0.5 10*3/uL (ref 0.1–1.0)
MONOS PCT: 9 % (ref 3–12)
NEUTROS ABS: 3.4 10*3/uL (ref 1.7–7.7)
Neutrophils Relative %: 56 % (ref 43–77)
Platelets: 196 10*3/uL (ref 150–400)
RBC: 5 MIL/uL (ref 3.87–5.11)
RDW: 14.3 % (ref 11.5–15.5)
WBC: 6.1 10*3/uL (ref 4.0–10.5)

## 2014-01-26 LAB — LIPID PANEL
Cholesterol: 219 mg/dL — ABNORMAL HIGH (ref 0–200)
HDL: 39 mg/dL — AB (ref >39)
LDL CALC: 137 mg/dL — AB (ref 0–99)
Total CHOL/HDL Ratio: 5.6 Ratio
Triglycerides: 213 mg/dL — ABNORMAL HIGH (ref <150)
VLDL: 43 mg/dL — ABNORMAL HIGH (ref 0–40)

## 2014-01-26 LAB — HEMOGLOBIN A1C
HEMOGLOBIN A1C: 6.3 % — AB (ref <5.7)
Mean Plasma Glucose: 134 mg/dL — ABNORMAL HIGH (ref <117)

## 2014-01-26 LAB — MAGNESIUM: MAGNESIUM: 1.6 mg/dL (ref 1.5–2.5)

## 2014-01-26 LAB — TSH: TSH: 0.286 u[IU]/mL — AB (ref 0.350–4.500)

## 2014-01-26 NOTE — Progress Notes (Signed)
MEDICARE ANNUAL WELLNESS VISIT AND FOLLOW UP  Assessment:   1. HYPERTENSION - CBC with Differential - BASIC METABOLIC PANEL WITH GFR - Hepatic function panel - TSH  2. 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 - Hemoglobin A1c - HM DIABETES FOOT EXAM  3. Encounter for long-term (current) use of other medications - Magnesium  4. HYPERLIPIDEMIA - Lipid panel  5. Vitamin D Deficiency - Vit D  25 hydroxy (rtn osteoporosis monitoring)  6. Estrogen deficiency - DG Bone Density; Future  7. Need for prophylactic vaccination against Streptococcus pneumoniae (pneumococcus) Prevnar 13 given  8.Screening for depression Negative screening  10. Patient had no falls in past year   Plan:   During the course of the visit the patient was educated and counseled about appropriate screening and preventive services including:    Pneumococcal vaccine   Influenza vaccine  Td vaccine  Screening electrocardiogram  Screening mammography  Bone densitometry screening  Colorectal cancer screening  Diabetes screening  Glaucoma screening  Nutrition counseling   Advanced directives: given info/requested  Screening recommendations, referrals:  Vaccinations: Tdap vaccine not indicated Influenza vaccine requested Pneumococcal vaccine not indicated Shingles vaccine not indicated Hep B vaccine not indicated  Nutrition assessed and recommended  Colonoscopy requested Mammogram requested Pap smear not indicated Pelvic exam not indicated Recommended yearly ophthalmology/optometry visit for glaucoma screening and checkup Recommended yearly dental visit for hygiene and checkup Advanced directives - requested  Conditions/risks identified: BMI: Discussed  weight loss, diet, and increase physical activity.  Increase physical activity: AHA recommends 150 minutes of physical activity a week.  Medications reviewed DEXA- ordered Diabetes is at goal, ACE/ARB therapy: Yes. Urinary Incontinence is not an issue: discussed non pharmacology and pharmacology options.  Fall risk: low- discussed PT, home fall assessment, medications.    Subjective:   Michele [name] is a 68 y.o. female who presents for Medicare Annual Wellness Visit and 3 month follow up on hypertension, diabetes, hyperlipidemia, vitamin D def.  Date of last medicare wellness visit is unknown.   Her blood pressure has been controlled at home, today their BP is BP: 120/78 mmHg She does not workout, but she has 0.5 acre garden and helps with a business. She denies chest pain, shortness of breath, dizziness.  She is not on cholesterol medication and denies myalgias. Her cholesterol is at goal. The cholesterol last visit was:   Lab Results  Component Value Date   CHOL 153 10/01/2013   HDL 43 10/01/2013   LDLCALC 84 10/01/2013   TRIG 130 10/01/2013   CHOLHDL 3.6 10/01/2013   She has been working on diet and exercise for diabetes, she has CKD stage III due to DM, she is on an ACE and metformin, and denies paresthesia of the feet, polydipsia and polyuria. Last A1C in the office was:  Lab Results  Component Value Date   HGBA1C 6.0* 10/01/2013   Patient is on Vitamin D supplement. 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   Names of Other Physician/Practitioners you currently use: 1. [facility]- here for primary care 2. [place], eye doctor, last visit Nov 2014 3. Dr. Donnal Debar, dentist, last visit q year Patient Care Team: [name], MD as  PCP - General (Internal Medicine) Dr. Amy Martinique, Derm Dr. Olevia Perches, GI  Medication Review Current Outpatient Prescriptions on File Prior to  Visit  Medication Sig Dispense Refill  . atenolol (TENORMIN) 100 MG tablet Take 1 tablet (100 mg total) by mouth daily.  90 tablet  3  . Biotin 1000 MCG tablet Take 1,000 mcg by mouth daily.      . Blood Glucose Monitoring Suppl (ACCU-CHEK NANO SMARTVIEW) W/DEVICE KIT CHECK BLOOD GLUCOSE THREE TIMES DAILY BEFORE MEALS  1 kit  0  . Cholecalciferol (VITAMIN D PO) Take 5,000 Int'l Units by mouth daily.       . citalopram (CELEXA) 20 MG tablet Take 1 tablet (20 mg total) by mouth daily.  90 tablet  3  . cloNIDine (CATAPRES) 0.2 MG tablet Take 1 tablet (0.2 mg total) by mouth 2 (two) times daily. Unless pressure is more than 160 then 3 times daily  180 tablet  0  . enalapril (VASOTEC) 20 MG tablet TAKE 1 TABLET TWICE DAILY  180 tablet  0  . furosemide (LASIX) 40 MG tablet Take 1 tablet (40 mg total) by mouth daily.  90 tablet  3  . glucose blood test strip 1 each by Other route 3 (three) times daily. Use as instructed.check glucose TID AC  100 each  12  . Iron TABS Take by mouth 2 (two) times daily.      . metFORMIN (GLUCOPHAGE-XR) 500 MG 24 hr tablet Take 1 tablet (500 mg total) by mouth 3 (three) times daily.  270 tablet  3  . Multiple Vitamins-Minerals (HAIR/SKIN/NAILS/BIOTIN PO) Take by mouth.      . Omega-3 Fatty Acids (FISH OIL) 1000 MG CAPS Take 1,000 mg by mouth.      . potassium chloride SA (K-DUR,KLOR-CON) 20 MEQ tablet Take 1 tablet (20 mEq total) by mouth daily.  90 tablet  3  . Red Yeast Rice 600 MG CAPS Take 600 mg by mouth 2 (two) times daily.       . traMADol (ULTRAM) 50 MG tablet Take 1 tablet (50 mg total) by mouth 4 (four) times daily. PRN  360 tablet  1  . verapamil (CALAN) 120 MG tablet Take 1 tablet (120 mg total) by mouth 2 (two) times daily.  180 tablet  3   No current facility-administered medications on file prior to visit.    Current Problems (verified) Patient Active Problem List   Diagnosis Date Noted  . Vitamin D Deficiency 06/28/2013  . Encounter for long-term  (current) use of other medications 06/28/2013  . Anemia 06/28/2013  . Anxiety   . Arthritis   . 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 HYPERLIPIDEMIA 03/21/2008  . HYPERTENSION 03/21/2008  . GERD 03/21/2008  . FATTY LIVER DISEASE 03/21/2008  . COLONIC POLYPS, ADENOMATOUS, HX OF 03/21/2008    Screening Tests Health Maintenance  Topic Date Due  . Foot Exam  [birth year]02/1956  . Ophthalmology Exam  01[birth year]02/1956  . Urine Microalbumin  01[birth year]02/1956  . Influenza Vaccine  01/01/2014  . Hemoglobin A1c  04/03/2014  . Mammogram  11/03/2015  . Colonoscopy  09/22/2017  . Tetanus/tdap  03/26/2023  . Pneumococcal Polysaccharide Vaccine Age 57 And Over  Completed  . Zostavax  Completed     Immunization History  Administered Date(s) Administered  . Influenza Split 03/25/2013  . Pneumococcal Polysaccharide-23 03/14/2011  . Td 03/25/2013  . Zoster 06/03/2005    Preventative care: Last colonoscopy: 2014 due 5 years Last  mammogram: 2015 Last pap smear/pelvic exam: 2011   DEXA:2013 Echo 2010 normal EF  Prior vaccinations: TD or Tdap: 2014  Influenza: 2014 Pneumococcal: 2012 Shingles/Zostavax: 2007  History reviewed: allergies, current medications, past family history, past medical history, past social history, past surgical history and problem list  Risk Factors: Osteoporosis: postmenopausal estrogen deficiency and dietary calcium and/or vitamin D deficiency History of fracture in the past year: no  Tobacco History  Substance Use Topics  . Smoking status: Never Smoker   . Smokeless tobacco: Never Used  . Alcohol Use: No   She does not smoke.  Patient is not a former smoker. Are there smokers in your home (other than you)?  No  Alcohol Current alcohol use: none  Caffeine Current caffeine use: coffee 1 /day  Exercise Current exercise: gardening  Nutrition/Diet Current diet: in general, a "healthy" diet    Cardiac risk factors: advanced age (older than 64 for men, 8  for women), diabetes mellitus, dyslipidemia and hypertension.  Depression Screen (Note: if answer to either of the following is "Yes", a more complete depression screening is indicated)   Q1: Over the past two weeks, have you felt down, depressed or hopeless? No  Q2: Over the past two weeks, have you felt little interest or pleasure in doing things? No  Have you lost interest or pleasure in daily life? No  Do you often feel hopeless? No  Do you cry easily over simple problems? No  Activities of Daily Living In your present state of health, do you have any difficulty performing the following activities?:  Driving? No Managing money?  No Feeding yourself? No Getting from bed to chair? No Climbing a flight of stairs? No Preparing food and eating?: No Bathing or showering? No Getting dressed: No Getting to the toilet? No Using the toilet:No Moving around from place to place: No In the past year have you fallen or had a near fall?:No   Are you sexually active?  No  Do you have more than one partner?  No  Vision Difficulties: No  Hearing Difficulties: No Do you often ask people to speak up or repeat themselves? No Do you experience ringing or noises in your ears? No Do you have difficulty understanding soft or whispered voices? No  Cognition  Do you feel that you have a problem with memory?No  Do you often misplace items? No  Do you feel safe at home?  Yes  Advanced directives Does patient have a [place]? Yes Does patient have a Living Will? Yes   Objective:   Blood pressure 120/78, pulse 56, temperature 97.7 F (36.5 C), resp. rate 16, height 5' 3" (1.6 m), weight 148 lb (67.132 kg). Body mass index is 26.22 kg/(m^2).  General appearance: alert, no distress, WD/WN,  female Cognitive Testing  Alert? Yes  Normal Appearance?Yes  Oriented to person? Yes  Place? Yes   Time? Yes  Recall of three objects?  Yes  Can perform simple calculations?  No  Displays appropriate judgment?Yes  Can read the correct time from a watch face?Yes  HEENT: normocephalic, sclerae anicteric, TMs pearly, nares patent, no discharge or erythema, pharynx normal Oral cavity: MMM, no lesions Neck: supple, no lymphadenopathy, no thyromegaly, no masses Heart: RRR, normal S1, S2, no murmurs Lungs: CTA bilaterally, no wheezes, rhonchi, or rales Abdomen: +bs, soft, non tender, non distended, no masses, no hepatomegaly, no splenomegaly Musculoskeletal: nontender, no swelling, no obvious deformity Extremities: no edema, no cyanosis, no clubbing  Pulses: 2+ symmetric, upper and lower extremities, normal cap refill Neurological: alert, oriented x 3, CN2-12 intact, strength normal upper extremities and lower extremities, sensation normal throughout, DTRs 2+ throughout, no cerebellar signs, gait normal Psychiatric: normal affect, behavior normal, pleasant  Breast: defer Gyn: defer Rectal: defer  Medicare Attestation I have personally reviewed: The patient's medical and social history Their use of alcohol, tobacco or illicit drugs Their current medications and supplements The patient's functional ability including ADLs,fall risks, home safety risks, cognitive, and hearing and visual impairment Diet and physical activities Evidence for depression or mood disorders  The patient's weight, height, BMI, and visual acuity have been recorded in the chart.  I have made referrals, counseling, and provided education to the patient based on review of the above and I have provided the patient with a written personalized care plan for preventive services.     Vicie Mutters, PA-C   01/26/2014

## 2014-01-27 LAB — VITAMIN D 25 HYDROXY (VIT D DEFICIENCY, FRACTURES): Vit D, 25-Hydroxy: 84 ng/mL (ref 30–89)

## 2014-02-10 ENCOUNTER — Telehealth: Payer: Self-pay

## 2014-02-10 NOTE — Telephone Encounter (Signed)
Message copied by Nadyne Coombes on Thu Feb 10, 2014  3:44 PM ------      Message from: Vicie Mutters R      Created: Wed Jan 26, 2014 10:03 AM       DEXA in NOV/DEC ------

## 2014-02-10 NOTE — Telephone Encounter (Signed)
Faxed orders to [facility] for DEXA. [name] will contact patient to schedule apppointment. Patient aware.

## 2014-02-17 ENCOUNTER — Other Ambulatory Visit: Payer: Self-pay | Admitting: Physician Assistant

## 2014-03-07 ENCOUNTER — Telehealth: Payer: Self-pay | Admitting: *Deleted

## 2014-03-07 NOTE — Telephone Encounter (Signed)
Patient aware of normal bone density scan.

## 2014-03-15 ENCOUNTER — Ambulatory Visit (INDEPENDENT_AMBULATORY_CARE_PROVIDER_SITE_OTHER): Payer: Commercial Managed Care - HMO | Admitting: *Deleted

## 2014-03-15 DIAGNOSIS — Z23 Encounter for immunization: Secondary | ICD-10-CM

## 2014-03-16 ENCOUNTER — Other Ambulatory Visit: Payer: Self-pay | Admitting: Physician Assistant

## 2014-03-20 DIAGNOSIS — E2839 Other primary ovarian failure: Secondary | ICD-10-CM

## 2014-03-21 ENCOUNTER — Other Ambulatory Visit: Payer: Self-pay | Admitting: Physician Assistant

## 2014-04-01 ENCOUNTER — Encounter: Payer: Self-pay | Admitting: Internal Medicine

## 2014-05-03 ENCOUNTER — Encounter: Payer: Self-pay | Admitting: Internal Medicine

## 2014-05-03 ENCOUNTER — Ambulatory Visit (INDEPENDENT_AMBULATORY_CARE_PROVIDER_SITE_OTHER): Payer: Commercial Managed Care - HMO | Admitting: Internal Medicine

## 2014-05-03 VITALS — BP 122/70 | HR 68 | Temp 97.9°F | Resp 18 | Ht 63.0 in | Wt 150.2 lb

## 2014-05-03 DIAGNOSIS — E1121 Type 2 diabetes mellitus with diabetic nephropathy: Secondary | ICD-10-CM

## 2014-05-03 DIAGNOSIS — Z0001 Encounter for general adult medical examination with abnormal findings: Secondary | ICD-10-CM

## 2014-05-03 DIAGNOSIS — J0410 Acute tracheitis without obstruction: Secondary | ICD-10-CM

## 2014-05-03 DIAGNOSIS — E782 Mixed hyperlipidemia: Secondary | ICD-10-CM

## 2014-05-03 DIAGNOSIS — B351 Tinea unguium: Secondary | ICD-10-CM

## 2014-05-03 DIAGNOSIS — Z1331 Encounter for screening for depression: Secondary | ICD-10-CM

## 2014-05-03 DIAGNOSIS — Z79899 Other long term (current) drug therapy: Secondary | ICD-10-CM

## 2014-05-03 DIAGNOSIS — Z1212 Encounter for screening for malignant neoplasm of rectum: Secondary | ICD-10-CM

## 2014-05-03 DIAGNOSIS — Z9181 History of falling: Secondary | ICD-10-CM

## 2014-05-03 DIAGNOSIS — I10 Essential (primary) hypertension: Secondary | ICD-10-CM

## 2014-05-03 DIAGNOSIS — E559 Vitamin D deficiency, unspecified: Secondary | ICD-10-CM

## 2014-05-03 MED ORDER — TERBINAFINE HCL 250 MG PO TABS: 250.0000 mg | ORAL_TABLET | Freq: Every day | ORAL | Status: DC

## 2014-05-03 MED ORDER — HYDROCODONE-ACETAMINOPHEN 5-325 MG PO TABS: ORAL_TABLET | ORAL | Status: DC

## 2014-05-03 MED ORDER — BENZONATATE 100 MG PO CAPS: ORAL_CAPSULE | ORAL | Status: DC

## 2014-05-03 MED ORDER — PREDNISONE 20 MG PO TABS: ORAL_TABLET | ORAL | Status: DC

## 2014-05-03 MED ORDER — AZITHROMYCIN 250 MG PO TABS: ORAL_TABLET | ORAL | Status: DC

## 2014-05-03 NOTE — Patient Instructions (Signed)
Recommend the book "The END of DIETING" by Dr Baker Janus   and the book "The END of DIABETES " by Dr Excell Seltzer  At [email or web address] - get book & Audio CD's      Being diabetic has a  300% increased risk for heart attack, stroke, cancer, and alzheimer- type vascular dementia. It is very important that you work harder with diet by avoiding all foods that are white except chicken & fish. Avoid white rice (brown & wild rice is OK), white potatoes (sweetpotatoes in moderation is OK), White bread or wheat bread or anything made out of white flour like bagels, donuts, rolls, buns, biscuits, cakes, pastries, cookies, pizza crust, and pasta (made from white flour & egg whites) - vegetarian pasta or spinach or wheat pasta is OK. Multigrain breads like Arnold's or [employer], or multigrain sandwich thins or flatbreads.  Diet, exercise and weight loss can reverse and cure diabetes in the early stages.  Diet, exercise and weight loss is very important in the control and prevention of complications of diabetes which affects every system in your body, ie. Brain - dementia/stroke, eyes - glaucoma/blindness, heart - heart attack/heart failure, kidneys - dialysis, stomach - gastric paralysis, intestines - malabsorption, nerves - severe painful neuritis, circulation - gangrene & loss of a leg(s), and finally cancer and Alzheimers.    I recommend avoid fried & greasy foods,  sweets/candy, white rice (brown or wild rice or Quinoa is OK), white potatoes (sweet potatoes are OK) - anything made from white flour - bagels, doughnuts, rolls, buns, biscuits,white and wheat breads, pizza crust and traditional pasta made of white flour & egg white(vegetarian pasta or spinach or wheat pasta is OK).  Multi-grain bread is OK - like multi-grain flat bread or sandwich thins. Avoid alcohol in excess. Exercise is also important.    Eat all the vegetables you want - avoid meat, especially red meat and dairy - especially cheese.  Cheese  is the most concentrated form of trans-fats which is the worst thing to clog up our arteries. Veggie cheese is OK which can be found in the fresh produce section at [employer] or [employer] or [employer]  Preventive Care for Adults A healthy lifestyle and preventive care can promote health and wellness. Preventive health guidelines for women include the following key practices.  A routine yearly physical is a good way to check with your health care provider about your health and preventive screening. It is a chance to share any concerns and updates on your health and to receive a thorough exam.  Visit your dentist for a routine exam and preventive care every 6 months. Brush your teeth twice a day and floss once a day. Good oral hygiene prevents tooth decay and gum disease.  The frequency of eye exams is based on your age, health, family medical history, use of contact lenses, and other factors. Follow your health care provider's recommendations for frequency of eye exams.  Eat a healthy diet. Foods like vegetables, fruits, whole grains, low-fat dairy products, and lean protein foods contain the nutrients you need without too many calories. Decrease your intake of foods high in solid fats, added sugars, and salt. Eat the right amount of calories for you.Get information about a proper diet from your health care provider, if necessary.  Regular physical exercise is one of the most important things you can do for your health. Most adults should get at least 150 minutes of moderate-intensity exercise (any activity that increases  your heart rate and causes you to sweat) each week. In addition, most adults need muscle-strengthening exercises on 2 or more days a week.  Maintain a healthy weight. The body mass index (BMI) is a screening tool to identify possible weight problems. It provides an estimate of body fat based on height and weight. Your health care provider can find your BMI and can help you  achieve or maintain a healthy weight.For adults 20 years and older:  A BMI below 18.5 is considered underweight.  A BMI of 18.5 to 24.9 is normal.  A BMI of 25 to 29.9 is considered overweight.  A BMI of 30 and above is considered obese.  Maintain normal blood lipids and cholesterol levels by exercising and minimizing your intake of saturated fat. Eat a balanced diet with plenty of fruit and vegetables. If your lipid or cholesterol levels are high, you are over 50, or you are at high risk for heart disease, you may need your cholesterol levels checked more frequently.Ongoing high lipid and cholesterol levels should be treated with medicines if diet and exercise are not working.  If you smoke, find out from your health care provider how to quit. If you do not use tobacco, do not start.  Lung cancer screening is recommended for adults aged 55-80 years who are at high risk for developing lung cancer because of a history of smoking. A yearly low-dose CT scan of the lungs is recommended for people who have at least a 30-pack-year history of smoking and are a current smoker or have quit within the past 15 years. A pack year of smoking is smoking an average of 1 pack of cigarettes a day for 1 year (for example: 1 pack a day for 30 years or 2 packs a day for 15 years). Yearly screening should continue until the smoker has stopped smoking for at least 15 years. Yearly screening should be stopped for people who develop a health problem that would prevent them from having lung cancer treatment.  Avoid use of street drugs. Do not share needles with anyone. Ask for help if you need support or instructions about stopping the use of drugs.  High blood pressure causes heart disease and increases the risk of stroke.  Ongoing high blood pressure should be treated with medicines if weight loss and exercise do not work.  If you are 55-79 years old, ask your health care provider if you should take aspirin to  prevent strokes.  Diabetes screening involves taking a blood sample to check your fasting blood sugar level. This should be done once every 3 years, after age 45, if you are within normal weight and without risk factors for diabetes. Testing should be considered at a younger age or be carried out more frequently if you are overweight and have at least 1 risk factor for diabetes.  Breast cancer screening is essential preventive care for women. You should practice "breast self-awareness." This means understanding the normal appearance and feel of your breasts and may include breast self-examination. Any changes detected, no matter how small, should be reported to a health care provider. Women in their 20s and 30s should have a clinical breast exam (CBE) by a health care provider as part of a regular health exam every 1 to 3 years. After age 40, women should have a CBE every year. Starting at age 40, women should consider having a mammogram (breast X-ray test) every year. Women who have a family history of breast cancer should   talk to their health care provider about genetic screening. Women at a high risk of breast cancer should talk to their health care providers about having an MRI and a mammogram every year.  Breast cancer gene (BRCA)-related cancer risk assessment is recommended for women who have family members with BRCA-related cancers. BRCA-related cancers include breast, ovarian, tubal, and peritoneal cancers. Having family members with these cancers may be associated with an increased risk for harmful changes (mutations) in the breast cancer genes BRCA1 and BRCA2. Results of the assessment will determine the need for genetic counseling and BRCA1 and BRCA2 testing.  Routine pelvic exams to screen for cancer are no longer recommended for nonpregnant women who are considered low risk for cancer of the pelvic organs (ovaries, uterus, and vagina) and who do not have symptoms. Ask your health care provider  if a screening pelvic exam is right for you.  If you have had past treatment for cervical cancer or a condition that could lead to cancer, you need Pap tests and screening for cancer for at least 20 years after your treatment. If Pap tests have been discontinued, your risk factors (such as having a new sexual partner) need to be reassessed to determine if screening should be resumed. Some women have medical problems that increase the chance of getting cervical cancer. In these cases, your health care provider may recommend more frequent screening and Pap tests.    Colorectal cancer can be detected and often prevented. Most routine colorectal cancer screening begins at the age of 90 years and continues through age 8 years. However, your health care provider may recommend screening at an earlier age if you have risk factors for colon cancer. On a yearly basis, your health care provider may provide home test kits to check for hidden blood in the stool. Use of a small camera at the end of a tube, to directly examine the colon (sigmoidoscopy or colonoscopy), can detect the earliest forms of colorectal cancer. Talk to your health care provider about this at age 86, when routine screening begins. Direct exam of the colon should be repeated every 5-10 years through age 32 years, unless early forms of pre-cancerous polyps or small growths are found.  Osteoporosis is a disease in which the bones lose minerals and strength with aging. This can result in serious bone fractures or breaks. The risk of osteoporosis can be identified using a bone density scan. Women ages 75 years and over and women at risk for fractures or osteoporosis should discuss screening with their health care providers. Ask your health care provider whether you should take a calcium supplement or vitamin D to reduce the rate of osteoporosis.  Menopause can be associated with physical symptoms and risks. Hormone replacement therapy is available to  decrease symptoms and risks. You should talk to your health care provider about whether hormone replacement therapy is right for you.  Use sunscreen. Apply sunscreen liberally and repeatedly throughout the day. You should seek shade when your shadow is shorter than you. Protect yourself by wearing long sleeves, pants, a wide-brimmed hat, and sunglasses year round, whenever you are outdoors.  Once a month, do a whole body skin exam, using a mirror to look at the skin on your back. Tell your health care provider of new moles, moles that have irregular borders, moles that are larger than a pencil eraser, or moles that have changed in shape or color.  Stay current with required vaccines (immunizations).  Influenza vaccine. All adults  should be immunized every year.  Tetanus, diphtheria, and acellular pertussis (Td, Tdap) vaccine. Pregnant women should receive 1 dose of Tdap vaccine during each pregnancy. The dose should be obtained regardless of the length of time since the last dose. Immunization is preferred during the 27th-36th week of gestation. An adult who has not previously received Tdap or who does not know her vaccine status should receive 1 dose of Tdap. This initial dose should be followed by tetanus and diphtheria toxoids (Td) booster doses every 10 years. Adults with an unknown or incomplete history of completing a 3-dose immunization series with Td-containing vaccines should begin or complete a primary immunization series including a Tdap dose. Adults should receive a Td booster every 10 years.    Zoster vaccine. One dose is recommended for adults aged 44 years or older unless certain conditions are present.    Pneumococcal 13-valent conjugate (PCV13) vaccine. When indicated, a person who is uncertain of her immunization history and has no record of immunization should receive the PCV13 vaccine. An adult aged 74 years or older who has certain medical conditions and has not been previously  immunized should receive 1 dose of PCV13 vaccine. This PCV13 should be followed with a dose of pneumococcal polysaccharide (PPSV23) vaccine. The PPSV23 vaccine dose should be obtained at least 8 weeks after the dose of PCV13 vaccine. An adult aged 66 years or older who has certain medical conditions and previously received 1 or more doses of PPSV23 vaccine should receive 1 dose of PCV13. The PCV13 vaccine dose should be obtained 1 or more years after the last PPSV23 vaccine dose.    Pneumococcal polysaccharide (PPSV23) vaccine. When PCV13 is also indicated, PCV13 should be obtained first. All adults aged 89 years and older should be immunized. An adult younger than age 80 years who has certain medical conditions should be immunized. Any person who resides in a nursing home or long-term care facility should be immunized. An adult smoker should be immunized. People with an immunocompromised condition and certain other conditions should receive both PCV13 and PPSV23 vaccines. People with human immunodeficiency virus (HIV) infection should be immunized as soon as possible after diagnosis. Immunization during chemotherapy or radiation therapy should be avoided. Routine use of PPSV23 vaccine is not recommended for American Indians, [place] Natives, or people younger than 65 years unless there are medical conditions that require PPSV23 vaccine. When indicated, people who have unknown immunization and have no record of immunization should receive PPSV23 vaccine. One-time revaccination 5 years after the first dose of PPSV23 is recommended for people aged 19-64 years who have chronic kidney failure, nephrotic syndrome, asplenia, or immunocompromised conditions. People who received 1-2 doses of PPSV23 before age 34 years should receive another dose of PPSV23 vaccine at age 31 years or later if at least 5 years have passed since the previous dose. Doses of PPSV23 are not needed for people immunized with PPSV23 at or after  age 29 years.   Preventive Services / Frequency  Ages 55 years and over  Blood pressure check.  Lipid and cholesterol check.  Lung cancer screening. / Every year if you are aged 29-80 years and have a 30-pack-year history of smoking and currently smoke or have quit within the past 15 years. Yearly screening is stopped once you have quit smoking for at least 15 years or develop a health problem that would prevent you from having lung cancer treatment.  Clinical breast exam.** / Every year after age 40 years.  BRCA-related cancer risk assessment.** / For women who have family members with a BRCA-related cancer (breast, ovarian, tubal, or peritoneal cancers).  Mammogram.** / Every year beginning at age 40 years and continuing for as long as you are in good health. Consult with your health care provider.  Pap test.** / Every 3 years starting at age 30 years through age 65 or 70 years with 3 consecutive normal Pap tests. Testing can be stopped between 65 and 70 years with 3 consecutive normal Pap tests and no abnormal Pap or HPV tests in the past 10 years.  Fecal occult blood test (FOBT) of stool. / Every year beginning at age 50 years and continuing until age 75 years. You may not need to do this test if you get a colonoscopy every 10 years.  Flexible sigmoidoscopy or colonoscopy.** / Every 5 years for a flexible sigmoidoscopy or every 10 years for a colonoscopy beginning at age 50 years and continuing until age 75 years.  Hepatitis C blood test.** / For all people born from 1945 through 1965 and any individual with known risks for hepatitis C.  Osteoporosis screening.** / A one-time screening for women ages 65 years and over and women at risk for fractures or osteoporosis.  Skin self-exam. / Monthly.  Influenza vaccine. / Every year.  Tetanus, diphtheria, and acellular pertussis (Tdap/Td) vaccine.** / 1 dose of Td every 10 years.  Zoster vaccine.** / 1 dose for adults aged 60 years  or older.  Pneumococcal 13-valent conjugate (PCV13) vaccine.** / Consult your health care provider.  Pneumococcal polysaccharide (PPSV23) vaccine.** / 1 dose for all adults aged 65 years and older. Screening for abdominal aortic aneurysm (AAA)  by ultrasound is recommended for people who have history of high blood pressure or who are current or former smokers. 

## 2014-05-03 NOTE — Progress Notes (Signed)
Patient ID: Michele [name], female   DOB: November 04, 1945, 68 y.o.   MRN: [id number]  Annual Screening Comprehensive Examination  This very nice 68 y.o.[name] presents for complete physical.  Patient has been followed for HTN, T2_NIDDM  Prediabetes, Hyperlipidemia, and Vitamin D Deficiency.    HTN predates since 86. Patient's BP has been controlled at home and patient denies any cardiac symptoms as chest pain, palpitations, shortness of breath, dizziness or ankle swelling. Today's BP: 122/70 mmHg    Patient's hyperlipidemia is controlled with diet and medications. Patient denies myalgias or other medication SE's. Last lipids were not at goal as patient is Statin Intolerant and Total Chol  219*; HDL  39*; LDL  137*; Trig 213 on 01/26/2014.   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 Last A1c was 6.3% on 01/26/2014.   Finally, patient has history of Vitamin D Deficiency of 19 in 2008 and last Vitamin D was 84 on 01/26/2014.  Medication Sig  . atenolol (TENORMIN) 100 MG tablet Take 1 tablet (100 mg total) by mouth daily.  . Biotin 1000 MCG tablet Take 1,000 mcg by mouth daily.  .  (ACCU-CHEK NANO  CHECK BLOOD GLUCOSE THREE TIMES DAILY BEFORE MEALS  . Cholecalciferol (VITAMIN D PO) Take 5,000 Int'l Units by mouth daily.   . citalopram (CELEXA) 20 MG tablet Take 1 tablet (20 mg total) by mouth daily.  . cloNIDine (CATAPRES) 0.2 MG tablet TAKE 1 TABLET 2 3 x /da   . enalapril (VASOTEC) 20 MG tablet TAKE 1 TABLET TWICE DAILY  . FLAXSEED OIL Take 1,000 mg by mouth daily.  . furosemide (LASIX) 40 MG tablet TAKE 1 TABLET EVERY DAY  . Iron TABS Take by mouth 2 (two) times daily.  . metFORMIN-XR 500 MG  Take 1 tablet (500 mg total) by mouth 3 (three) times daily.  Marland Kitchen  HAIR/SKIN/NAILS/BIOTIN  Take by mouth.  Marland Kitchen FISH OIL 1000 MG  Take 1,000 mg by mouth.  . potassium chloride  20 MEQ tab Take 1  tablet (20 mEq total) by mouth daily.  . Red Yeast Rice 600 MG  Take 600 mg by mouth 2 (two) times daily.   . traMADol (ULTRAM) 50 MG tablet Take 1 tablet (50 mg total) by mouth 4 (four) times daily. PRN  . verapamil  120 MG tablet Take 1 tablet (120 mg total) by mouth 2 (two) times daily.   Allergies  Allergen Reactions  . Lipitor [Atorvastatin]     Elevates LFT's   Past Medical History  Diagnosis Date  . Hypertension   . Carpal tunnel syndrome   . Tubulovillous adenoma of colon   . GERD (gastroesophageal reflux disease)   . Hyperlipidemia   . 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 Fatty liver   . Esophageal motility disorder   . Duodenal diverticulum   . Anxiety   . Arthritis   . Type II or unspecified type diabetes mellitus without mention of complication, not stated as uncontrolled    Health Maintenance  Topic Date Due  . OPHTHALMOLOGY EXAM  01/02/1956  . URINE MICROALBUMIN  01/02/1956  . HEMOGLOBIN A1C  07/29/2014  . INFLUENZA VACCINE  01/02/2015  . FOOT EXAM  01/27/2015  . MAMMOGRAM  11/03/2015  . COLONOSCOPY  09/22/2017  . TETANUS/TDAP  03/26/2023  . PNEUMOCOCCAL POLYSACCHARIDE VACCINE AGE 36 AND OVER  Completed  . ZOSTAVAX  Completed   Immunization History  Administered  Date(s) Administered  . Influenza Split 03/25/2013  . Influenza, High Dose Seasonal PF 03/15/2014  . Pneumococcal Conjugate-13 01/26/2014  . Pneumococcal Polysaccharide-23 03/14/2011  . Td 03/25/2013  . Zoster 06/03/2005   Past Surgical History  Procedure Laterality Date  . Appendectomy    . Tonsillectomy    . Carpal tunnel release    . Cyst removal hand     Family History  Problem Relation Age of Onset  . Heart disease Mother   . Heart disease Father   . Heart disease Brother     x2  . Diabetes Mother   . Diabetes Father   . Colon polyps Mother    History  Substance Use Topics  . Smoking status: Never Smoker   . Smokeless tobacco: Never Used  . Alcohol Use: No     ROS Constitutional: Denies fever, chills, weight loss/gain, headaches, insomnia, fatigue, night sweats, and change in appetite. Eyes: Denies redness, blurred vision, diplopia, discharge, itchy, watery eyes.  ENT: Denies discharge, congestion, post nasal drip, epistaxis, sore throat, earache, hearing loss, dental pain, Tinnitus, Vertigo, Sinus pain, snoring.  Cardio: Denies chest pain, palpitations, irregular heartbeat, syncope, dyspnea, diaphoresis, orthopnea, PND, claudication, edema Respiratory: denies cough, dyspnea, DOE, pleurisy, hoarseness, laryngitis, wheezing.  Gastrointestinal: Denies dysphagia, heartburn, reflux, water brash, pain, cramps, nausea, vomiting, bloating, diarrhea, constipation, hematemesis, melena, hematochezia, jaundice, hemorrhoids Genitourinary: Denies dysuria, frequency, urgency, nocturia, hesitancy, discharge, hematuria, flank pain Breast: Breast lumps, nipple discharge, bleeding.  Musculoskeletal: Denies arthralgia, myalgia, stiffness, Jt. Swelling, pain, limp, and strain/sprain. Denies falls. Skin: Denies puritis, rash, hives, warts, acne, eczema, changing in skin lesion Neuro: No weakness, tremor, incoordination, spasms, paresthesia, pain Psychiatric: Denies confusion, memory loss, sensory loss. Denies Depression. Endocrine: Denies change in weight, skin, hair change, nocturia, and paresthesia, diabetic polys, visual blurring, hyper / hypo glycemic episodes.  Heme/Lymph: No excessive bleeding, bruising, enlarged lymph nodes.  Physical Exam  BP 122/70   Pulse 68  Temp 97.9 F   Resp 18  Ht 5\' 3"    Wt 150 lb 3.2 oz   BMI 26.61   General Appearance: Well nourished and in no apparent distress. Eyes: PERRLA, EOMs, conjunctiva no swelling or erythema, normal fundi and vessels. Sinuses: No frontal/maxillary tenderness ENT/Mouth: EACs patent / TMs  nl. Nares clear without erythema, swelling, mucoid exudates. Oral hygiene is good. No erythema, swelling, or exudate.  Tongue normal, non-obstructing. Tonsils not swollen or erythematous. Hearing normal.  Neck: Supple, thyroid normal. No bruits, nodes or JVD. Respiratory: Respiratory effort normal.  BS equal and clear bilateral without rales, rhonci, wheezing or stridor. Cardio: Heart sounds are normal with regular rate and rhythm and no murmurs, rubs or gallops. Peripheral pulses are normal and equal bilaterally without edema. No aortic or femoral bruits. Chest: symmetric with normal excursions and percussion. Breasts: Symmetric, without lumps, nipple discharge, retractions, or fibrocystic changes.  Abdomen: Flat, soft, with bowl sounds. Nontender, no guarding, rebound, hernias, masses, or organomegaly.  Lymphatics: Non tender without lymphadenopathy.  Genitourinary:  Musculoskeletal: Full ROM all peripheral extremities, joint stability, 5/5 strength, and normal gait. Skin: Warm and dry without rashes, lesions, cyanosis, clubbing or  ecchymosis.  Neuro: Cranial nerves intact, reflexes equal bilaterally. Normal muscle tone, no cerebellar symptoms. Sensation intact by mono-filament testing. Pysch: Awake and oriented X 3, normal affect, Insight and Judgment appropriate.   Assessment and Plan  1. Annual Screening Examination 2. Hypertension  3. Hyperlipidemia 4. T2_NIDDM w/CKD2 5. Vitamin D Deficiency   Continue prudent diet as discussed, weight  control, BP monitoring, regular exercise, and medications. Discussed med's effects and SE's. Screening labs and tests as requested with regular follow-up as recommended.

## 2014-05-04 LAB — LIPID PANEL
Cholesterol: 220 mg/dL — ABNORMAL HIGH (ref 0–200)
HDL: 37 mg/dL — ABNORMAL LOW (ref >39)
Total CHOL/HDL Ratio: 5.9 Ratio
Triglycerides: 530 mg/dL — ABNORMAL HIGH (ref <150)

## 2014-05-04 LAB — URINALYSIS, MICROSCOPIC ONLY
BACTERIA UA: NONE SEEN
CASTS: NONE SEEN
CRYSTALS: NONE SEEN
Squamous Epithelial / LPF: NONE SEEN

## 2014-05-04 LAB — URIC ACID: URIC ACID, SERUM: 9.2 mg/dL — AB (ref 2.4–7.0)

## 2014-05-04 LAB — VITAMIN D 25 HYDROXY (VIT D DEFICIENCY, FRACTURES): Vit D, 25-Hydroxy: 51 ng/mL (ref 30–100)

## 2014-05-04 LAB — BASIC METABOLIC PANEL WITH GFR
BUN: 17 mg/dL (ref 6–23)
CO2: 28 meq/L (ref 19–32)
CREATININE: 0.93 mg/dL (ref 0.50–1.10)
Calcium: 10 mg/dL (ref 8.4–10.5)
Chloride: 98 mEq/L (ref 96–112)
GFR, EST NON AFRICAN AMERICAN: 63 mL/min
GFR, Est African American: 73 mL/min
Glucose, Bld: 144 mg/dL — ABNORMAL HIGH (ref 70–99)
Potassium: 4.1 mEq/L (ref 3.5–5.3)
Sodium: 140 mEq/L (ref 135–145)

## 2014-05-04 LAB — HEPATIC FUNCTION PANEL
ALBUMIN: 4.1 g/dL (ref 3.5–5.2)
ALK PHOS: 84 U/L (ref 39–117)
ALT: 29 U/L (ref 0–35)
AST: 22 U/L (ref 0–37)
Bilirubin, Direct: 0.1 mg/dL (ref 0.0–0.3)
Indirect Bilirubin: 0.5 mg/dL (ref 0.2–1.2)
Total Bilirubin: 0.6 mg/dL (ref 0.2–1.2)
Total Protein: 6.5 g/dL (ref 6.0–8.3)

## 2014-05-04 LAB — CBC WITH DIFFERENTIAL/PLATELET
BASOS ABS: 0 10*3/uL (ref 0.0–0.1)
Basophils Relative: 0 % (ref 0–1)
Eosinophils Absolute: 0.1 10*3/uL (ref 0.0–0.7)
Eosinophils Relative: 2 % (ref 0–5)
HEMATOCRIT: 42.3 % (ref 36.0–46.0)
HEMOGLOBIN: 13.9 g/dL (ref 12.0–15.0)
LYMPHS PCT: 31 % (ref 12–46)
Lymphs Abs: 2.1 10*3/uL (ref 0.7–4.0)
MCH: 28.1 pg (ref 26.0–34.0)
MCHC: 32.9 g/dL (ref 30.0–36.0)
MCV: 85.6 fL (ref 78.0–100.0)
MPV: 8.8 fL — ABNORMAL LOW (ref 9.4–12.4)
Monocytes Absolute: 0.5 10*3/uL (ref 0.1–1.0)
Monocytes Relative: 7 % (ref 3–12)
Neutro Abs: 4.1 10*3/uL (ref 1.7–7.7)
Neutrophils Relative %: 60 % (ref 43–77)
PLATELETS: 246 10*3/uL (ref 150–400)
RBC: 4.94 MIL/uL (ref 3.87–5.11)
RDW: 14.7 % (ref 11.5–15.5)
WBC: 6.9 10*3/uL (ref 4.0–10.5)

## 2014-05-04 LAB — MAGNESIUM: Magnesium: 1.9 mg/dL (ref 1.5–2.5)

## 2014-05-04 LAB — MICROALBUMIN / CREATININE URINE RATIO
Creatinine, Urine: 56.7 mg/dL
Microalb Creat Ratio: 15.9 mg/g (ref 0.0–30.0)
Microalb, Ur: 0.9 mg/dL (ref <2.0)

## 2014-05-04 LAB — HEMOGLOBIN A1C
HEMOGLOBIN A1C: 6.4 % — AB (ref <5.7)
MEAN PLASMA GLUCOSE: 137 mg/dL — AB (ref <117)

## 2014-05-04 LAB — TSH: TSH: 0.289 u[IU]/mL — AB (ref 0.350–4.500)

## 2014-05-04 LAB — INSULIN, FASTING: Insulin fasting, serum: 16.6 u[IU]/mL (ref 2.0–19.6)

## 2014-05-08 ENCOUNTER — Other Ambulatory Visit: Payer: Self-pay | Admitting: Internal Medicine

## 2014-05-08 MED ORDER — ALLOPURINOL 300 MG PO TABS: ORAL_TABLET | ORAL | Status: DC

## 2014-06-09 ENCOUNTER — Other Ambulatory Visit: Payer: Self-pay | Admitting: Physician Assistant

## 2014-07-18 ENCOUNTER — Other Ambulatory Visit: Payer: Self-pay | Admitting: Physician Assistant

## 2014-07-18 ENCOUNTER — Encounter: Payer: Self-pay | Admitting: *Deleted

## 2014-08-12 ENCOUNTER — Ambulatory Visit: Payer: PPO | Admitting: Physician Assistant

## 2014-08-16 ENCOUNTER — Other Ambulatory Visit: Payer: Self-pay | Admitting: *Deleted

## 2014-08-16 MED ORDER — TERBINAFINE HCL 250 MG PO TABS: 250.0000 mg | ORAL_TABLET | Freq: Every day | ORAL | Status: DC

## 2014-08-18 ENCOUNTER — Other Ambulatory Visit: Payer: Self-pay | Admitting: Physician Assistant

## 2014-08-19 ENCOUNTER — Ambulatory Visit (INDEPENDENT_AMBULATORY_CARE_PROVIDER_SITE_OTHER): Payer: Commercial Managed Care - HMO | Admitting: Physician Assistant

## 2014-08-19 ENCOUNTER — Encounter: Payer: Self-pay | Admitting: Physician Assistant

## 2014-08-19 VITALS — BP 128/68 | HR 56 | Temp 97.7°F | Resp 16 | Ht 63.0 in | Wt 153.0 lb

## 2014-08-19 DIAGNOSIS — I10 Essential (primary) hypertension: Secondary | ICD-10-CM

## 2014-08-19 DIAGNOSIS — E1121 Type 2 diabetes mellitus with diabetic nephropathy: Secondary | ICD-10-CM

## 2014-08-19 DIAGNOSIS — E782 Mixed hyperlipidemia: Secondary | ICD-10-CM

## 2014-08-19 DIAGNOSIS — Z79899 Other long term (current) drug therapy: Secondary | ICD-10-CM

## 2014-08-19 DIAGNOSIS — K2100 Gastro-esophageal reflux disease with esophagitis, without bleeding: Secondary | ICD-10-CM

## 2014-08-19 DIAGNOSIS — M109 Gout, unspecified: Secondary | ICD-10-CM

## 2014-08-19 DIAGNOSIS — E559 Vitamin D deficiency, unspecified: Secondary | ICD-10-CM

## 2014-08-19 LAB — BASIC METABOLIC PANEL WITH GFR
BUN: 15 mg/dL (ref 6–23)
CO2: 29 meq/L (ref 19–32)
CREATININE: 0.86 mg/dL (ref 0.50–1.10)
Calcium: 9.2 mg/dL (ref 8.4–10.5)
Chloride: 100 mEq/L (ref 96–112)
GFR, EST AFRICAN AMERICAN: 80 mL/min
GFR, Est Non African American: 70 mL/min
Glucose, Bld: 121 mg/dL — ABNORMAL HIGH (ref 70–99)
Potassium: 4 mEq/L (ref 3.5–5.3)
SODIUM: 139 meq/L (ref 135–145)

## 2014-08-19 LAB — CBC WITH DIFFERENTIAL/PLATELET
Basophils Absolute: 0.1 10*3/uL (ref 0.0–0.1)
Basophils Relative: 1 % (ref 0–1)
EOS PCT: 3 % (ref 0–5)
Eosinophils Absolute: 0.2 10*3/uL (ref 0.0–0.7)
HCT: 42.3 % (ref 36.0–46.0)
Hemoglobin: 14.4 g/dL (ref 12.0–15.0)
Lymphocytes Relative: 31 % (ref 12–46)
Lymphs Abs: 1.7 10*3/uL (ref 0.7–4.0)
MCH: 28.3 pg (ref 26.0–34.0)
MCHC: 34 g/dL (ref 30.0–36.0)
MCV: 83.1 fL (ref 78.0–100.0)
MPV: 9.2 fL (ref 8.6–12.4)
Monocytes Absolute: 0.4 10*3/uL (ref 0.1–1.0)
Monocytes Relative: 8 % (ref 3–12)
NEUTROS PCT: 57 % (ref 43–77)
Neutro Abs: 3.1 10*3/uL (ref 1.7–7.7)
PLATELETS: 191 10*3/uL (ref 150–400)
RBC: 5.09 MIL/uL (ref 3.87–5.11)
RDW: 14.6 % (ref 11.5–15.5)
WBC: 5.5 10*3/uL (ref 4.0–10.5)

## 2014-08-19 LAB — LIPID PANEL
Cholesterol: 211 mg/dL — ABNORMAL HIGH (ref 0–200)
HDL: 35 mg/dL — ABNORMAL LOW (ref >=46)
LDL Cholesterol: 128 mg/dL — ABNORMAL HIGH (ref 0–99)
Total CHOL/HDL Ratio: 6 Ratio
Triglycerides: 241 mg/dL — ABNORMAL HIGH (ref <150)
VLDL: 48 mg/dL — AB (ref 0–40)

## 2014-08-19 LAB — URIC ACID: URIC ACID, SERUM: 6.6 mg/dL (ref 2.4–7.0)

## 2014-08-19 LAB — HEPATIC FUNCTION PANEL
ALBUMIN: 4 g/dL (ref 3.5–5.2)
ALT: 39 U/L — AB (ref 0–35)
AST: 32 U/L (ref 0–37)
Alkaline Phosphatase: 97 U/L (ref 39–117)
Bilirubin, Direct: 0.2 mg/dL (ref 0.0–0.3)
Indirect Bilirubin: 0.7 mg/dL (ref 0.2–1.2)
TOTAL PROTEIN: 6.7 g/dL (ref 6.0–8.3)
Total Bilirubin: 0.9 mg/dL (ref 0.2–1.2)

## 2014-08-19 LAB — TSH: TSH: 0.669 u[IU]/mL (ref 0.350–4.500)

## 2014-08-19 LAB — MAGNESIUM: Magnesium: 1.8 mg/dL (ref 1.5–2.5)

## 2014-08-19 MED ORDER — TERBINAFINE HCL 250 MG PO TABS: 250.0000 mg | ORAL_TABLET | Freq: Every day | ORAL | Status: DC

## 2014-08-19 NOTE — Patient Instructions (Signed)
Please pick one of the over the counter allergy medications below and take it once daily for allergies.  Claritin or loratadine cheapest but likely the weakest  Zyrtec or certizine at night because it can make you sleepy The strongest is allegra or fexafinadine  Cheapest at [employer], [employer], [employer]  Diabetes is a very complicated disease...lets simplify it.  An easy way to look at it to understand the complications is if you think of the extra sugar floating in your blood stream as glass shards floating through your blood stream.    Diabetes affects your small vessels first: 1) The glass shards (sugar) scraps down the tiny blood vessels in your eyes and lead to diabetic retinopathy, the leading cause of blindness in the [place]. Diabetes is the leading cause of newly diagnosed adult (55 to 69 years of age) blindness in the [place].  2) The glass shards scratches down the tiny vessels of your legs leading to nerve damage called neuropathy and can lead to amputations of your feet. More than 60% of all non-traumatic amputations of lower limbs occur in people with diabetes.  3) Over time the small vessels in your brain are shredded and closed off, individually this does not cause any problems but over a long period of time many of the small vessels being blocked can lead to Vascular Dementia.   4) Your kidney's are a filter system and have a "net" that keeps certain things in the body and lets bad things out. Sugar shreds this net and leads to kidney damage and eventually failure. Decreasing the sugar that is destroying the net and certain blood pressure medications can help stop or decrease progression of kidney disease. Diabetes was the primary cause of kidney failure in 44 percent of all new cases in 2011.  5) Diabetes also destroys the small vessels in your penis that lead to erectile dysfunction. Eventually the vessels are so damaged that you may not be responsive to cialis or viagra.   Diabetes  and your large vessels: Your larger vessels consist of your coronary arteries in your heart and the carotid vessels to your brain. Diabetes or even increased sugars put you at 300% increased risk of heart attack and stroke and this is why.. The sugar scrapes down your large blood vessels and your body sees this as an internal injury and tries to repair itself. Just like you get a scab on your skin, your platelets will stick to the blood vessel wall trying to heal it. This is why we have diabetics on low dose aspirin daily, this prevents the platelets from sticking and can prevent plaque formation. In addition, your body takes cholesterol and tries to shove it into the open wound. This is why we want your LDL, or bad cholesterol, below 70.   The combination of platelets and cholesterol over 5-10 years forms plaque that can break off and cause a heart attack or stroke.   PLEASE REMEMBER:  Diabetes is preventable! Up to 57 percent of complications and morbidities among individuals with type 2 diabetes can be prevented, delayed, or effectively treated and minimized with regular visits to a health professional, appropriate monitoring and medication, and a healthy diet and lifestyle.     Bad carbs also include fruit juice, alcohol, and sweet tea. These are empty calories that do not signal to your brain that you are full.   Please remember the good carbs are still carbs which convert into sugar. So please measure them out no  more than 1/2-1 cup of rice, oatmeal, pasta, and beans  Veggies are however free foods! Pile them on.   Not all fruit is created equal. Please see the list below, the fruit at the bottom is higher in sugars than the fruit at the top. Please avoid all dried fruits.

## 2014-08-19 NOTE — Progress Notes (Signed)
Assessment and Plan:  1. Hypertension -Continue medication, she is having some rebound HTN with only taking the clonidine 2 times a day likely, will try to decrease this rebound by switching enalapril to the morning, if this does not work we may have to try clonidine patch, switch meds, or switch to TID of 0.57m., monitor blood pressure at home. Continue DASH diet.  Reminder to go to the ER if any CP, SOB, nausea, dizziness, severe HA, changes vision/speech, left arm numbness and tingling and jaw pain.  2. Cholesterol -Continue diet and exercise. Check cholesterol.   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 Check A1C  4. Vitamin D Def - check level and continue medications.   5. Cough  -was off enalapril for 2-3 weeks without resolution, + post nasal drip/allergies, will get on allergy pill.   6. Onychomycosis  Lamisil has done 3 months, then go to every other month on and off,  side effects discussed, cheapest at [employer]  7. Gout  recheck Uric acid and BMP, just started on allopurinol since Dec  Diet discussed, continue medications.   Continue diet and meds as discussed. Further disposition pending results of labs. Discussed med's effects and SE's.    HPI 69y.o. female  presents for 3 month follow up HTN, Chol, DM with CKD stage II, and vitamin D def.  Her blood pressure has been controlled at home except at night it can run 180's, she is on clonidine 0.249min the morning and evening, lasix 4043mn the AM, atenolol 100, enalapril 20, verapamil 120 all at night late today her BP is BP: 128/68 mmHg.  She does workout outside but no regular exercise. She denies chest pain, shortness of breath, dizziness.  She is not on cholesterol medication and denies myalgias. Her cholesterol is not at goal. The cholesterol was:   Lab Results  Component Value Date   CHOL 220* 05/03/2014   HDL 37* 05/03/2014   LDLCALC NOT CALC 05/03/2014   TRIG 530* 05/03/2014   CHOLHDL 5.9 05/03/2014    She has been working on diet and exercise for diabetes with diabetic chronic kidney disease, she is not on bASA but will take 325 ASA 3 x a week for OA, she is on ACE/ARB, metformin 500m80mx a day and denies  paresthesia of the feet, polydipsia, polyuria and visual disturbances. Last A1C was:  Lab Results  Component Value Date   HGBA1C 6.4* 05/03/2014  Patient is on Vitamin D supplement. Lab Results  Component Value Date   VD25OH 51 05/03/2014    Current Medications:  Current Outpatient Prescriptions on File Prior to Visit  Medication Sig Dispense Refill  . allopurinol (ZYLOPRIM) 300 MG tablet Take 1/2 to 1 tablet daily as directed to prevent Gout 90 tablet 99  . Biotin 1000 MCG tablet Take 1,000 mcg by mouth daily.    . Blood Glucose Monitoring Suppl (ACCU-CHEK NANO SMARTVIEW) W/DEVICE KIT CHECK BLOOD GLUCOSE THREE TIMES DAILY BEFORE MEALS 1 kit 0  . Cholecalciferol (VITAMIN D PO) Take 5,000 Int'l Units by mouth daily.     . citalopram (CELEXA) 20 MG tablet TAKE 1 TABLET EVERY DAY 90 tablet 3  . cloNIDine (CATAPRES) 0.2 MG tablet TAKE 1 TABLET TWO TIMES DAILY, UNLESS PRESSURE IS MORE THAN 160 THEN 3 TIMES DAILY AS DIRECTED 270 tablet 3  . enalapril (VASOTEC) 20 MG tablet TAKE 1 TABLET TWICE DAILY 180 tablet 99  . Flaxseed, Linseed, (FLAXSEED OIL  PO) Take 1,000 mg by mouth daily.    . furosemide (LASIX) 40 MG tablet TAKE 1 TABLET EVERY DAY 90 tablet 99  . glucose blood test strip 1 each by Other route 3 (three) times daily. Use as instructed.check glucose TID AC 100 each 12  . Iron TABS Take by mouth 2 (two) times daily.    . metFORMIN (GLUCOPHAGE-XR) 500 MG 24 hr tablet Take 1 tablet (500 mg total) by mouth 3 (three) times daily. 270 tablet 3  . Multiple Vitamins-Minerals (HAIR/SKIN/NAILS/BIOTIN PO) Take by mouth.    . Omega-3 Fatty Acids (FISH OIL) 1000 MG CAPS Take 1,000 mg by mouth.    . potassium chloride SA (K-DUR,KLOR-CON) 20 MEQ  tablet Take 1 tablet (20 mEq total) by mouth daily. 90 tablet 3  . Red Yeast Rice 600 MG CAPS Take 600 mg by mouth 2 (two) times daily.     Marland Kitchen terbinafine (LAMISIL) 250 MG tablet Take 1 tablet (250 mg total) by mouth daily. 90 tablet 0  . traMADol (ULTRAM) 50 MG tablet Take 1 tablet (50 mg total) by mouth 4 (four) times daily. PRN 360 tablet 1  . verapamil (CALAN) 120 MG tablet TAKE 1 TABLET TWICE DAILY 180 tablet 3  . atenolol (TENORMIN) 100 MG tablet Take 1 tablet (100 mg total) by mouth daily. 90 tablet 3   No current facility-administered medications on file prior to visit.   Medical History:  Past Medical History  Diagnosis Date  . Hypertension   . Carpal tunnel syndrome   . Tubulovillous adenoma of colon   . GERD (gastroesophageal reflux disease)   . Hyperlipidemia   . 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 Fatty liver   . Esophageal motility disorder   . Duodenal diverticulum   . Anxiety   . Arthritis   . Type II or unspecified type diabetes mellitus without mention of complication, not stated as uncontrolled    Allergies:  Allergies  Allergen Reactions  . Lipitor [Atorvastatin]     Elevates LFT's    Review of Systems:  Review of Systems  Constitutional: Negative.   HENT: Negative.   Eyes: Negative.   Respiratory: Negative.   Cardiovascular: Negative.   Gastrointestinal: Negative.   Genitourinary: Negative.   Musculoskeletal: Negative.   Skin: Negative.   Neurological: Negative.   Endo/Heme/Allergies: Negative.   Psychiatric/Behavioral: Negative.     Family history- Review and unchanged Social history- Review and unchanged Physical Exam: BP 128/68 mmHg  Pulse 56  Temp(Src) 97.7 F (36.5 C)  Resp 16  Ht _0  (1.6 m)  Wt 153 lb (69.4 kg)  BMI 27.11 kg/m2 Wt Readings from Last 3 Encounters:  08/19/14 153 lb (69.4 kg)  05/03/14 150 lb 3.2 oz (68.13 kg)  01/26/14 148 lb (67.132 kg)   General Appearance: Well nourished, in no apparent distress. Eyes: PERRLA,  EOMs, conjunctiva no swelling or erythema Sinuses: No Frontal/maxillary tenderness ENT/Mouth: Ext aud canals clear, TMs without erythema, bulging. No erythema, swelling, or exudate on post pharynx.  Tonsils not swollen or erythematous. Hearing normal.  Neck: Supple, thyroid normal.  Respiratory: Respiratory effort normal, BS equal bilaterally without rales, rhonchi, wheezing or stridor.  Cardio: RRR with no MRGs. Brisk peripheral pulses without edema.  Abdomen: Soft, + BS.  Non tender, no guarding, rebound, hernias, masses. Lymphatics: Non tender without lymphadenopathy.  Musculoskeletal: Full ROM, 5/5 strength, Normal gait Skin: Warm, dry without rashes, lesions, ecchymosis. right big toe nail with thickening, brittle and yellow, no erythema, patient states  some improvement towards the matrix.  Neuro: Cranial nerves intact. No cerebellar symptoms.  Psych: Awake and oriented X 3, normal affect, Insight and Judgment appropriate.    Vicie Mutters, PA-C 9:46 AM [facility]

## 2014-08-20 LAB — HEMOGLOBIN A1C
Hgb A1c MFr Bld: 6.6 % — ABNORMAL HIGH
Mean Plasma Glucose: 143 mg/dL — ABNORMAL HIGH

## 2014-08-20 LAB — VITAMIN D 25 HYDROXY (VIT D DEFICIENCY, FRACTURES): Vit D, 25-Hydroxy: 45 ng/mL (ref 30–100)

## 2014-09-12 ENCOUNTER — Other Ambulatory Visit: Payer: Self-pay | Admitting: Physician Assistant

## 2014-09-22 ENCOUNTER — Encounter: Payer: Self-pay | Admitting: Physician Assistant

## 2014-09-22 ENCOUNTER — Ambulatory Visit (INDEPENDENT_AMBULATORY_CARE_PROVIDER_SITE_OTHER): Payer: Commercial Managed Care - HMO | Admitting: Physician Assistant

## 2014-09-22 VITALS — BP 142/80 | HR 68 | Temp 97.7°F | Resp 16 | Ht 63.0 in | Wt 156.0 lb

## 2014-09-22 DIAGNOSIS — L03011 Cellulitis of right finger: Secondary | ICD-10-CM

## 2014-09-22 DIAGNOSIS — E1121 Type 2 diabetes mellitus with diabetic nephropathy: Secondary | ICD-10-CM

## 2014-09-22 DIAGNOSIS — M109 Gout, unspecified: Secondary | ICD-10-CM

## 2014-09-22 LAB — CBC WITH DIFFERENTIAL/PLATELET
BASOS ABS: 0 10*3/uL (ref 0.0–0.1)
Basophils Relative: 0 % (ref 0–1)
EOS ABS: 0.2 10*3/uL (ref 0.0–0.7)
EOS PCT: 4 % (ref 0–5)
HEMATOCRIT: 37.8 % (ref 36.0–46.0)
Hemoglobin: 13.1 g/dL (ref 12.0–15.0)
LYMPHS ABS: 1.2 10*3/uL (ref 0.7–4.0)
Lymphocytes Relative: 27 % (ref 12–46)
MCH: 28.7 pg (ref 26.0–34.0)
MCHC: 34.7 g/dL (ref 30.0–36.0)
MCV: 82.7 fL (ref 78.0–100.0)
MONOS PCT: 15 % — AB (ref 3–12)
MPV: 8.6 fL (ref 8.6–12.4)
Monocytes Absolute: 0.7 10*3/uL (ref 0.1–1.0)
Neutro Abs: 2.5 10*3/uL (ref 1.7–7.7)
Neutrophils Relative %: 54 % (ref 43–77)
Platelets: 160 10*3/uL (ref 150–400)
RBC: 4.57 MIL/uL (ref 3.87–5.11)
RDW: 14.9 % (ref 11.5–15.5)
WBC: 4.6 10*3/uL (ref 4.0–10.5)

## 2014-09-22 MED ORDER — PREDNISONE 20 MG PO TABS: ORAL_TABLET | ORAL | Status: DC

## 2014-09-22 MED ORDER — SULFAMETHOXAZOLE-TRIMETHOPRIM 800-160 MG PO TABS: 1.0000 | ORAL_TABLET | Freq: Two times a day (BID) | ORAL | Status: DC

## 2014-09-22 NOTE — Progress Notes (Signed)
Subjective:    Patient ID: Michele [name], female    DOB: [birth year], 69 y.o.   MRN: [id number]  HPI 69 y.o. right handed female with history of HTN, DM II with CKD stage II, gout presents for right 3rd finger swelling x 1.5 weeks. She does does not recall an injury. She has swelling, some warmth and redness at her MTP/PIP of 3rd finger that is now started to have swelling extend into her hand. She did express a small amount of clear discharge. Has tried heating pad, bengay, and it is unchanged. Denies fever, chills, dizziness, nausea, diarrhea. She does have history of gout, she is on 384m , has not stopped it.   Blood pressure 142/80, pulse 68, temperature 97.7 F (36.5 C), resp. rate 16, height _0  (1.6 m), weight 156 lb (70.761 kg).  Past Medical History  Diagnosis Date  . Hypertension   . Carpal tunnel syndrome   . Tubulovillous adenoma of colon   . GERD (gastroesophageal reflux disease)   . Hyperlipidemia   . 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 Fatty liver   . Esophageal motility disorder   . Duodenal diverticulum   . Anxiety   . Arthritis   . Type II or unspecified type diabetes mellitus without mention of complication, not stated as uncontrolled    Current Outpatient Prescriptions on File Prior to Visit  Medication Sig Dispense Refill  . allopurinol (ZYLOPRIM) 300 MG tablet Take 1/2 to 1 tablet daily as directed to prevent Gout 90 tablet 99  . Biotin 1000 MCG tablet Take 1,000 mcg by mouth daily.    . Blood Glucose Monitoring Suppl (ACCU-CHEK NANO SMARTVIEW) W/DEVICE KIT CHECK BLOOD GLUCOSE THREE TIMES DAILY BEFORE MEALS 1 kit 0  . Cholecalciferol (VITAMIN D PO) Take 5,000 Int'l Units by mouth daily.     . citalopram (CELEXA) 20 MG tablet TAKE 1 TABLET EVERY DAY 90 tablet 3  . cloNIDine (CATAPRES) 0.2 MG tablet TAKE 1 TABLET TWO TIMES DAILY, UNLESS PRESSURE IS MORE THAN 160 THEN 3 TIMES DAILY AS DIRECTED 270 tablet 3  . enalapril (VASOTEC) 20 MG tablet TAKE 1 TABLET TWICE  DAILY 180 tablet 99  . Flaxseed, Linseed, (FLAXSEED OIL PO) Take 1,000 mg by mouth daily.    . furosemide (LASIX) 40 MG tablet TAKE 1 TABLET EVERY DAY 90 tablet 99  . glucose blood test strip 1 each by Other route 3 (three) times daily. Use as instructed.check glucose TID AC 100 each 12  . Iron TABS Take by mouth 2 (two) times daily.    . metFORMIN (GLUCOPHAGE-XR) 500 MG 24 hr tablet TAKE 1 TABLET THREE TIMES DAILY 270 tablet 3  . Multiple Vitamins-Minerals (HAIR/SKIN/NAILS/BIOTIN PO) Take by mouth.    . Omega-3 Fatty Acids (FISH OIL) 1000 MG CAPS Take 1,000 mg by mouth.    . potassium chloride SA (K-DUR,KLOR-CON) 20 MEQ tablet Take 1 tablet (20 mEq total) by mouth daily. 90 tablet 3  . Red Yeast Rice 600 MG CAPS Take 600 mg by mouth 2 (two) times daily.     .Marland Kitchenterbinafine (LAMISIL) 250 MG tablet Take 1 tablet (250 mg total) by mouth daily. 90 tablet 0  . traMADol (ULTRAM) 50 MG tablet Take 1 tablet (50 mg total) by mouth 4 (four) times daily. PRN 360 tablet 1  . verapamil (CALAN) 120 MG tablet TAKE 1 TABLET TWICE DAILY 180 tablet 3  . atenolol (TENORMIN) 100 MG tablet Take 1 tablet (100 mg total) by mouth daily.  90 tablet 3   No current facility-administered medications on file prior to visit.    Review of Systems  Constitutional: Negative.  Negative for fever, chills and fatigue.  HENT: Negative.   Respiratory: Negative.   Cardiovascular: Negative.   Gastrointestinal: Negative.   Genitourinary: Negative.   Musculoskeletal: Positive for joint swelling. Negative for myalgias, back pain, arthralgias, gait problem, neck pain and neck stiffness.  Skin: Positive for rash. Negative for color change, pallor and wound.  Neurological: Negative.   Hematological: Negative.        Objective:   Physical Exam  Constitutional: She is oriented to person, place, and time. She appears well-developed and well-nourished.  Neck: Normal range of motion. Neck supple.  Cardiovascular: Normal rate and  regular rhythm.   Pulmonary/Chest: Effort normal and breath sounds normal.  Abdominal: Soft. Bowel sounds are normal.  Musculoskeletal:  Right 3rd finger with mild erythema, warmth, healing scab at latearal proximal 3rd finger near MCP. Swelling from PIP to into her MCP. Decrease ROM due to pain/swelling. Tender to palpation at 3rd PIP. Distally neurovascular intact.   Lymphadenopathy:    She has no cervical adenopathy.  Neurological: She is alert and oriented to person, place, and time.  Skin: Skin is warm and dry.      Assessment & Plan:  Cellulitis versus gout-  Get CBC, uric acid, BMP Will do pulse dose of prednisone, understands will increase sugars.  Continue allopurinol.   Trimethoprim/Sulfa prescribed. Follow up in 7 days for wound check.

## 2014-09-22 NOTE — Patient Instructions (Signed)
Please take the prednisone to help decrease inflammation and therefore decrease symptoms. Take it it with food to avoid GI upset. It can cause increased energy but on the other hand it can make it hard to sleep at night so please take it AT [place], it takes 8-12 hours to start working so it will NOT affect your sleeping if you take it at night with your food!!  If you are diabetic it will increase your sugars so decrease carbs and monitor your sugars closely.    Information for patients with Gout  Gout defined-Gout occurs when urate crystals accumulate in your joint causing the inflammation and intense pain of gout attack.  Urate crystals can form when you have high levels of uric acid in your blood.  Your body produces uric acid when it breaks down prurines-substances that are found naturally in your body, as well as in certain foods such as organ meats, anchioves, herring, asparagus, and mushrooms.  Normally uric acid dissolves in your blood and passes through your kidneys into your urine.  But sometimes your body either produces too much uric acid or your kidneys excrete too little uric acid.  When this happens, uric acid can build up, forming sharp needle-like urate crystals in a joint or surrounding tissue that cause pain, inflammation and swelling.    Gout is characterized by sudden, severe attacks of pain, redness and tenderness in joints, often the joint at the base of the big toe.  Gout is complex form of arthritis that can affect anyone.  Men are more likely to get gout but women become increasingly more susceptible to gout after menopause.  An acute attack of gout can wake you up in the middle of the night with the sensation that your big toe is on fire.  The affected joint is hot, swollen and so tender that even the weight or the sheet on it may seem intolerable.  If you experience symptoms of an acute gout attack it is important to your doctor as soon as the symptoms start.   Gout that goes untreated can lead to worsening pain and joint damage.  Risk Factors:  You are more likely to develop gout if you have high levels of uric acid in your body.    Factors that increase the uric acid level in your body include:  Lifestyle factors.  Excessive alcohol use-generally more than two drinks a day for men and more than one for women increase the risk of gout.  Medical conditions.  Certain conditions make it more likely that you will develop gout.  These include hypertension, and chronic conditions such as diabetes, high levels of fat and cholesterol in the blood, and narrowing of the arteries.  Certain medications.  The uses of Thiazide diuretics- commonly used to treat hypertension and low dose aspirin can also increase uric acid levels.  Family history of gout.  If other members of your family have had gout, you are more likely to develop the disease.  Age and sex. Gout occurs more often in men than it does in women, primarily because women tend to have lower uric acid levels than men do.  Men are more likely to develop gout earlier usually between the ages of 74-50- whereas women generally develop signs and symptoms after menopause.    Tests and diagnosis:  Tests to help diagnose gout may include:  Blood test.  Your doctor may recommend a blood test to measure the uric acid level in your  blood .  Blood tests can be misleading, though.  Some people have high uric acid levels but never experience gout.  And some people have signs and symptoms of gout, but don't have unusual levels of uric acid in their blood.  Joint fluid test.  Your doctor may use a needle to draw fluid from your affected joint.  When examined under the microscope, your joint fluid may reveal urate crystals.  Treatment:  Treatment for gout usually involves medications.  What medications you and your doctor choose will be based on your current health and other medications you currently take.  Gout  medications can be used to treat acute gout attacks and prevent future attacks as well as reduce your risk of complications from gout such as the development of tophi from urate crystal deposits.  Alternative medicine:   Certain foods have been studied for their potential to lower uric acid levels, including:  Coffee.  Studies have found an association between coffee drinking (regular and decaf) and lower uric acid levels.  The evidence is not enough to encourage non-coffee drinkers to start, but it may give clues to new ways of treating gout in the future.  Vitamin C.  Supplements containing vitamin C may reduce the levels of uric acid in your blood.  However, vitamin as a treatment for gout. Don't assume that if a little vitamin C is good, than lots is better.  Megadoses of vitamin C may increase your bodies uric acid levels.  Cherries.  Cherries have been associated with lower levels of uric acid in studies, but it isn't clear if they have any effect on gout signs and symptoms.  Eating more cherries and other dar-colored fruits, such as blackberries, blueberries, purple grapes and raspberries, may be a safe way to support your gout treatment.    Lifestyle/Diet Recommendations:   Drink 8 to 16 cups ( about 2 to 4 liters) of fluid each day, with at least half being water.  Avoid alcohol  Eat a moderate amount of protein, preferably from healthy sources, such as low-fat or fat-free dairy, tofu, eggs, and nut butters.  Limit you daily intake of meat, fish, and poultry to 4 to 6 ounces.  Avoid high fat meats and desserts.  Decrease you intake of shellfish, beef, lamb, pork, eggs and cheese.  Choose a good source of vitamin C daily such as citrus fruits, strawberries, broccoli,  brussel sprouts, papaya, and cantaloupe.   Choose a good source of vitamin A every other day such as yellow fruits, or dark green/yellow vegetables.  Avoid drastic weigh reduction or fasting.  If weigh loss is  desired lose it over a period of several months.  See "dietary considerations.." chart for specific food recommendations.  Dietary Considerations for people with Gout  Food with negligible purine content (0-15 mg of purine nitrogen per 100 grams food)  May use as desired except on calorie variations  Non fat milk Cocoa Cereals (except in list II) Hard candies  Buttermilk Carbonated drinks Vegetables (except in list II) Sherbet  Coffee Fruits Sugar Honey  Tea Cottage Cheese Gelatin-jell-o Salt  Fruit juice Breads Angel food Cake   Herbs/spices Jams/Jellies [employer]    Foods that do not contain excessive purine content, but must be limited due to fat content  Cream Eggs Oil and Salad Dressing  Half and Half Peanut Butter Chocolate  Whole Milk Cakes Potato Chips  Butter Ice Cream Fried Foods  Cheese Nuts Waffles, pancakes   List  II: Food with moderate purine content (50-150 mg of purine nitrogen per 100 grams of food)  Limit total amount each day to 5 oz. cooked Lean meat, other than those on list III   Poultry, other than those on list III Fish, other than those on list III   Seafood, other than those on list III  These foods may be used occasionally  Peas Lentils Bran  Spinach Oatmeal Dried Beans and Peas  Asparagus Wheat Germ Mushrooms   Additional information about meat choices  Choose fish and poultry, particularly without skin, often.  Select lean, well trimmed cuts of meat.  Avoid all fatty meats, bacon , sausage, fried meats, fried fish, or poultry, luncheon meats, cold cuts, hot dogs, meats canned or frozen in gravy, spareribs and frozen and packaged prepared meats.   List III: Foods with HIGH purine content / Foods to AVOID (150-800 mg of purine nitrogen per 100 grams of food)  Anchovies Herring Meat Broths  Liver Mackerel Meat Extracts  Kidney Scallops Meat Drippings  Sardines Wild Game Mincemeat  Sweetbreads Goose Gravy  Heart Tongue Yeast,  baker's and brewers   Commercial soups made with any of the foods listed in List II or List III  In addition avoid all alcoholic beverages  Continue antibiotic as prescribed.  Continue warm wet compresses and you can buy a sitz bath from [facility] and do 2-3 times a day  Call if any worsening discharge, fever, chills, warmth, redness.   Abscess An abscess is an infected area that contains a collection of pus and debris.It can occur in almost any part of the body. An abscess is also known as a furuncle or boil. CAUSES  An abscess occurs when tissue gets infected. This can occur from blockage of oil or sweat glands, infection of hair follicles, or a minor injury to the skin. As the body tries to fight the infection, pus collects in the area and creates pressure under the skin. This pressure causes pain. People with weakened immune systems have difficulty fighting infections and get certain abscesses more often.  SYMPTOMS Usually an abscess develops on the skin and becomes a painful mass that is red, warm, and tender. If the abscess forms under the skin, you may feel a moveable soft area under the skin. Some abscesses break open (rupture) on their own, but most will continue to get worse without care. The infection can spread deeper into the body and eventually into the bloodstream, causing you to feel ill.  TREATMENT  Your caregiver may prescribe antibiotic medicines to fight the infection. However, taking antibiotics alone usually does not cure an abscess. Your caregiver may need to make a small cut (incision) in the abscess to drain the pus. In some cases, gauze is packed into the abscess to reduce pain and to continue draining the area. HOME CARE INSTRUCTIONS   Only take over-the-counter or prescription medicines for pain, discomfort, or fever as directed by your caregiver.  If you were prescribed antibiotics, take them as directed. Finish them even if you start to feel better.  If gauze  is used, follow your caregiver's directions for changing the gauze.  To avoid spreading the infection:  Keep your draining abscess covered with a bandage.  Wash your hands well.  Do not share personal care items, towels, or whirlpools with others.  Avoid skin contact with others.  Keep your skin and clothes clean around the abscess.  Keep all follow-up appointments as directed by your caregiver. SEEK  MEDICAL CARE IF:   You have increased pain, swelling, redness, fluid drainage, or bleeding.  You have muscle aches, chills, or a general ill feeling.  You have a fever. MAKE SURE YOU:   Understand these instructions.  Will watch your condition.  Will get help right away if you are not doing well or get worse.

## 2014-09-23 LAB — URIC ACID: URIC ACID, SERUM: 4.7 mg/dL (ref 2.4–7.0)

## 2014-09-23 LAB — BASIC METABOLIC PANEL WITH GFR
BUN: 12 mg/dL (ref 6–23)
CALCIUM: 9.6 mg/dL (ref 8.4–10.5)
CO2: 26 mEq/L (ref 19–32)
Chloride: 101 mEq/L (ref 96–112)
Creat: 0.8 mg/dL (ref 0.50–1.10)
GFR, EST NON AFRICAN AMERICAN: 76 mL/min
GFR, Est African American: 88 mL/min
Glucose, Bld: 111 mg/dL — ABNORMAL HIGH (ref 70–99)
POTASSIUM: 4.3 meq/L (ref 3.5–5.3)
SODIUM: 137 meq/L (ref 135–145)

## 2014-09-29 ENCOUNTER — Ambulatory Visit (INDEPENDENT_AMBULATORY_CARE_PROVIDER_SITE_OTHER): Payer: Commercial Managed Care - HMO | Admitting: Physician Assistant

## 2014-09-29 ENCOUNTER — Encounter: Payer: Self-pay | Admitting: Physician Assistant

## 2014-09-29 VITALS — BP 128/78 | HR 68 | Temp 97.9°F | Resp 16 | Ht 63.0 in | Wt 149.0 lb

## 2014-09-29 DIAGNOSIS — M109 Gout, unspecified: Secondary | ICD-10-CM

## 2014-09-29 NOTE — Patient Instructions (Signed)
Clonidine is an 8 hour medication and can cause REBOUND hypertension if you go without it.  Try to take the clonidine closer to 5 or 6 pm.  We may have switch this medication to 0.1mg  three times a day OR try to switch it entirely to a medication called norvasc 5mg  twice a day.   Monitor your blood pressure at home, if it is above 140/90 consistently call the office so we can adjust your medications. Go to the ER if any CP, SOB, nausea, dizziness, severe HA, changes vision/speech  DASH Eating Plan DASH stands for "Dietary Approaches to Stop Hypertension." The DASH eating plan is a healthy eating plan that has been shown to reduce high blood pressure (hypertension). Additional health benefits may include reducing the risk of type 2 diabetes mellitus, heart disease, and stroke. The DASH eating plan may also help with weight loss. WHAT DO I NEED TO KNOW ABOUT THE DASH EATING PLAN? For the DASH eating plan, you will follow these general guidelines:  Choose foods with a percent daily value for sodium of less than 5% (as listed on the food label).  Use salt-free seasonings or herbs instead of table salt or sea salt.  Check with your health care provider or pharmacist before using salt substitutes.  Eat lower-sodium products, often labeled as "lower sodium" or "no salt added."  Eat fresh foods.  Eat more vegetables, fruits, and low-fat dairy products.  Choose whole grains. Look for the word "whole" as the first word in the ingredient list.  Choose fish and skinless chicken or [place] more often than red meat. Limit fish, poultry, and meat to 6 oz (170 g) each day.  Limit sweets, desserts, sugars, and sugary drinks.  Choose heart-healthy fats.  Limit cheese to 1 oz (28 g) per day.  Eat more home-cooked food and less restaurant, buffet, and fast food.  Limit fried foods.  Cook foods using methods other than frying.  Limit canned vegetables. If you do use them, rinse them well to decrease  the sodium.  When eating at a restaurant, ask that your food be prepared with less salt, or no salt if possible. WHAT FOODS CAN I EAT? Seek help from a dietitian for individual calorie needs. Grains Whole grain or whole wheat bread. Brown rice. Whole grain or whole wheat pasta. Quinoa, bulgur, and whole grain cereals. Low-sodium cereals. Corn or whole wheat flour tortillas. Whole grain cornbread. Whole grain crackers. Low-sodium crackers. Vegetables Fresh or frozen vegetables (raw, steamed, roasted, or grilled). Low-sodium or reduced-sodium tomato and vegetable juices. Low-sodium or reduced-sodium tomato sauce and paste. Low-sodium or reduced-sodium canned vegetables.  Fruits All fresh, canned (in natural juice), or frozen fruits. Meat and Other Protein Products Ground beef (85% or leaner), grass-fed beef, or beef trimmed of fat. Skinless chicken or [place]. Ground chicken or [place]. Pork trimmed of fat. All fish and seafood. Eggs. Dried beans, peas, or lentils. Unsalted nuts and seeds. Unsalted canned beans. Dairy Low-fat dairy products, such as skim or 1% milk, 2% or reduced-fat cheeses, low-fat ricotta or cottage cheese, or plain low-fat yogurt. Low-sodium or reduced-sodium cheeses. Fats and Oils Tub margarines without trans fats. Light or reduced-fat mayonnaise and salad dressings (reduced sodium). Avocado. Safflower, olive, or canola oils. Natural peanut or almond butter. Other Unsalted popcorn and pretzels. The items listed above may not be a complete list of recommended foods or beverages. Contact your dietitian for more options. WHAT FOODS ARE NOT RECOMMENDED? Grains White bread. White pasta. White rice.  Refined cornbread. Bagels and croissants. Crackers that contain trans fat. Vegetables Creamed or fried vegetables. Vegetables in a cheese sauce. Regular canned vegetables. Regular canned tomato sauce and paste. Regular tomato and vegetable juices. Fruits Dried fruits. Canned fruit in  light or heavy syrup. Fruit juice. Meat and Other Protein Products Fatty cuts of meat. Ribs, chicken wings, bacon, sausage, bologna, salami, chitterlings, fatback, hot dogs, bratwurst, and packaged luncheon meats. Salted nuts and seeds. Canned beans with salt. Dairy Whole or 2% milk, cream, half-and-half, and cream cheese. Whole-fat or sweetened yogurt. Full-fat cheeses or blue cheese. Nondairy creamers and whipped toppings. Processed cheese, cheese spreads, or cheese curds. Condiments Onion and garlic salt, seasoned salt, table salt, and sea salt. Canned and packaged gravies. Worcestershire sauce. Tartar sauce. Barbecue sauce. Teriyaki sauce. Soy sauce, including reduced sodium. Steak sauce. Fish sauce. Oyster sauce. Cocktail sauce. Horseradish. Ketchup and mustard. Meat flavorings and tenderizers. Bouillon cubes. Hot sauce. Tabasco sauce. Marinades. Taco seasonings. Relishes. Fats and Oils Butter, stick margarine, lard, shortening, ghee, and bacon fat. Coconut, palm kernel, or palm oils. Regular salad dressings. Other Pickles and olives. Salted popcorn and pretzels. The items listed above may not be a complete list of foods and beverages to avoid. Contact your dietitian for more information. WHERE CAN I FIND MORE INFORMATION? National Heart, Lung, and Blood Institute: travelstabloid.com Document Released: 05/09/2011 Document Revised: 10/04/2013 Document Reviewed: 03/24/2013 [facility] Patient Information 2015 [place], [place]. This information is not intended to replace advice given to you by your health care provider. Make sure you discuss any questions you have with your health care provider.

## 2014-09-29 NOTE — Progress Notes (Signed)
Assessment and Plan: Right 3rd finger pain- recheck uric acid since no longer in flare, if negative likely OA/trigger finger, declines ortho referral.  BP- try to take clonidine closer to 5 or 6 pm, if still abnormal will try to add low dose norvasc  HPI 69 y.o.female presents for 1 week follow up. She had a 3 month visit on 08/19/2014 and presented with right 3rd finger swelling on 09/22/2014. She had a normal CBC, uric acid, she was put on pulse dose of prednisone and bactrim at that time. She states it 75% better, it is still slightly swollen and has some tenderness at the 3rd right PIP.     Past Medical History  Diagnosis Date  . Hypertension   . Carpal tunnel syndrome   . Tubulovillous adenoma of colon   . GERD (gastroesophageal reflux disease)   . Hyperlipidemia   . 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 Fatty liver   . Esophageal motility disorder   . Duodenal diverticulum   . Anxiety   . Arthritis   . Type II or unspecified type diabetes mellitus without mention of complication, not stated as uncontrolled      Allergies  Allergen Reactions  . Lipitor [Atorvastatin]     Elevates LFT's      Current Outpatient Prescriptions on File Prior to Visit  Medication Sig Dispense Refill  . allopurinol (ZYLOPRIM) 300 MG tablet Take 1/2 to 1 tablet daily as directed to prevent Gout 90 tablet 99  . Biotin 1000 MCG tablet Take 1,000 mcg by mouth daily.    . Blood Glucose Monitoring Suppl (ACCU-CHEK NANO SMARTVIEW) W/DEVICE KIT CHECK BLOOD GLUCOSE THREE TIMES DAILY BEFORE MEALS 1 kit 0  . Cholecalciferol (VITAMIN D PO) Take 5,000 Int'l Units by mouth daily.     . citalopram (CELEXA) 20 MG tablet TAKE 1 TABLET EVERY DAY 90 tablet 3  . cloNIDine (CATAPRES) 0.2 MG tablet TAKE 1 TABLET TWO TIMES DAILY, UNLESS PRESSURE IS MORE THAN 160 THEN 3 TIMES DAILY AS DIRECTED 270 tablet 3  . enalapril (VASOTEC) 20 MG tablet TAKE 1 TABLET TWICE DAILY 180 tablet 99  . Flaxseed, Linseed, (FLAXSEED OIL PO) Take  1,000 mg by mouth daily.    . furosemide (LASIX) 40 MG tablet TAKE 1 TABLET EVERY DAY 90 tablet 99  . glucose blood test strip 1 each by Other route 3 (three) times daily. Use as instructed.check glucose TID AC 100 each 12  . Iron TABS Take by mouth 2 (two) times daily.    . metFORMIN (GLUCOPHAGE-XR) 500 MG 24 hr tablet TAKE 1 TABLET THREE TIMES DAILY 270 tablet 3  . Multiple Vitamins-Minerals (HAIR/SKIN/NAILS/BIOTIN PO) Take by mouth.    . Omega-3 Fatty Acids (FISH OIL) 1000 MG CAPS Take 1,000 mg by mouth.    . potassium chloride SA (K-DUR,KLOR-CON) 20 MEQ tablet Take 1 tablet (20 mEq total) by mouth daily. 90 tablet 3  . predniSONE (DELTASONE) 20 MG tablet 2 tablets daily for 3 days, 1 tablet daily for 4 days. 10 tablet 0  . Red Yeast Rice 600 MG CAPS Take 600 mg by mouth 2 (two) times daily.     Marland Kitchen sulfamethoxazole-trimethoprim (BACTRIM DS,SEPTRA DS) 800-160 MG per tablet Take 1 tablet by mouth 2 (two) times daily. 20 tablet 0  . terbinafine (LAMISIL) 250 MG tablet Take 1 tablet (250 mg total) by mouth daily. 90 tablet 0  . traMADol (ULTRAM) 50 MG tablet Take 1 tablet (50 mg total) by mouth 4 (four) times daily.  PRN 360 tablet 1  . verapamil (CALAN) 120 MG tablet TAKE 1 TABLET TWICE DAILY 180 tablet 3  . atenolol (TENORMIN) 100 MG tablet Take 1 tablet (100 mg total) by mouth daily. 90 tablet 3   No current facility-administered medications on file prior to visit.    ROS: all negative except above.   Physical Exam: Filed Weights   09/29/14 1613  Weight: 149 lb (67.586 kg)   BP 128/78 mmHg  Pulse 68  Temp(Src) 97.9 F (36.6 C)  Resp 16  Ht '5\' 3"'  (1.6 m)  Wt 149 lb (67.586 kg)  BMI 26.40 kg/m2 onstitutional: She is oriented to person, place, and time. She appears well-developed and well-nourished.  Neck: Normal range of motion. Neck supple.  Cardiovascular: Normal rate and regular rhythm.   Pulmonary/Chest: Effort normal and breath sounds normal.  Abdominal: Soft. Bowel sounds  are normal.  Musculoskeletal:  Right 3rd finger with mild swelling and tenderness at the PIP, without erythema, warmth, Distally neurovascular intact.   Lymphadenopathy:    She has no cervical adenopathy.  Neurological: She is alert and oriented to person, place, and time.  Skin: Skin is warm and dry.     Vicie Mutters, PA-C 4:17 PM [facility]

## 2014-09-30 LAB — URIC ACID: Uric Acid, Serum: 5.9 mg/dL (ref 2.4–7.0)

## 2014-11-03 ENCOUNTER — Other Ambulatory Visit: Payer: Self-pay

## 2014-11-03 DIAGNOSIS — Z1231 Encounter for screening mammogram for malignant neoplasm of breast: Secondary | ICD-10-CM

## 2014-11-17 ENCOUNTER — Ambulatory Visit (INDEPENDENT_AMBULATORY_CARE_PROVIDER_SITE_OTHER): Payer: Commercial Managed Care - HMO | Admitting: Internal Medicine

## 2014-11-17 ENCOUNTER — Encounter: Payer: Self-pay | Admitting: Internal Medicine

## 2014-11-17 VITALS — BP 122/70 | HR 60 | Temp 97.0°F | Resp 16 | Ht 63.0 in | Wt 148.0 lb

## 2014-11-17 DIAGNOSIS — Z79899 Other long term (current) drug therapy: Secondary | ICD-10-CM

## 2014-11-17 DIAGNOSIS — K219 Gastro-esophageal reflux disease without esophagitis: Secondary | ICD-10-CM

## 2014-11-17 DIAGNOSIS — E782 Mixed hyperlipidemia: Secondary | ICD-10-CM

## 2014-11-17 DIAGNOSIS — M1000 Idiopathic gout, unspecified site: Secondary | ICD-10-CM

## 2014-11-17 DIAGNOSIS — E559 Vitamin D deficiency, unspecified: Secondary | ICD-10-CM

## 2014-11-17 DIAGNOSIS — E1121 Type 2 diabetes mellitus with diabetic nephropathy: Secondary | ICD-10-CM

## 2014-11-17 DIAGNOSIS — E663 Overweight: Secondary | ICD-10-CM

## 2014-11-17 DIAGNOSIS — I10 Essential (primary) hypertension: Secondary | ICD-10-CM

## 2014-11-17 LAB — CBC WITH DIFFERENTIAL/PLATELET
Basophils Absolute: 0.1 10*3/uL (ref 0.0–0.1)
Basophils Relative: 1 % (ref 0–1)
EOS ABS: 0.4 10*3/uL (ref 0.0–0.7)
EOS PCT: 7 % — AB (ref 0–5)
HCT: 43.9 % (ref 36.0–46.0)
HEMOGLOBIN: 15.3 g/dL — AB (ref 12.0–15.0)
LYMPHS PCT: 25 % (ref 12–46)
Lymphs Abs: 1.4 10*3/uL (ref 0.7–4.0)
MCH: 29.7 pg (ref 26.0–34.0)
MCHC: 34.9 g/dL (ref 30.0–36.0)
MCV: 85.2 fL (ref 78.0–100.0)
MONO ABS: 0.7 10*3/uL (ref 0.1–1.0)
MONOS PCT: 12 % (ref 3–12)
MPV: 9.1 fL (ref 8.6–12.4)
Neutro Abs: 3.1 10*3/uL (ref 1.7–7.7)
Neutrophils Relative %: 55 % (ref 43–77)
Platelets: 197 10*3/uL (ref 150–400)
RBC: 5.15 MIL/uL — ABNORMAL HIGH (ref 3.87–5.11)
RDW: 16 % — ABNORMAL HIGH (ref 11.5–15.5)
WBC: 5.7 10*3/uL (ref 4.0–10.5)

## 2014-11-17 LAB — BASIC METABOLIC PANEL WITH GFR
BUN: 13 mg/dL (ref 6–23)
CALCIUM: 9.6 mg/dL (ref 8.4–10.5)
CO2: 31 mEq/L (ref 19–32)
Chloride: 98 mEq/L (ref 96–112)
Creat: 0.95 mg/dL (ref 0.50–1.10)
GFR, Est African American: 71 mL/min
GFR, Est Non African American: 62 mL/min
GLUCOSE: 146 mg/dL — AB (ref 70–99)
POTASSIUM: 3.7 meq/L (ref 3.5–5.3)
SODIUM: 141 meq/L (ref 135–145)

## 2014-11-17 LAB — HEPATIC FUNCTION PANEL
ALBUMIN: 4.4 g/dL (ref 3.5–5.2)
ALK PHOS: 99 U/L (ref 39–117)
ALT: 35 U/L (ref 0–35)
AST: 26 U/L (ref 0–37)
Bilirubin, Direct: 0.2 mg/dL (ref 0.0–0.3)
Indirect Bilirubin: 0.6 mg/dL (ref 0.2–1.2)
Total Bilirubin: 0.8 mg/dL (ref 0.2–1.2)
Total Protein: 6.9 g/dL (ref 6.0–8.3)

## 2014-11-17 LAB — LIPID PANEL
CHOL/HDL RATIO: 5.8 ratio
Cholesterol: 219 mg/dL — ABNORMAL HIGH (ref 0–200)
HDL: 38 mg/dL — AB (ref >=46)
LDL Cholesterol: 132 mg/dL — ABNORMAL HIGH (ref 0–99)
Triglycerides: 244 mg/dL — ABNORMAL HIGH (ref <150)
VLDL: 49 mg/dL — AB (ref 0–40)

## 2014-11-17 LAB — URIC ACID: Uric Acid, Serum: 5.3 mg/dL (ref 2.4–7.0)

## 2014-11-17 LAB — TSH: TSH: 0.596 u[IU]/mL (ref 0.350–4.500)

## 2014-11-17 LAB — MAGNESIUM: MAGNESIUM: 1.8 mg/dL (ref 1.5–2.5)

## 2014-11-17 NOTE — Progress Notes (Signed)
Patient ID: Michele [name], female   DOB: Jan 20, 1946, 69 y.o.   MRN: [id number]   This very nice 69 y.o. [name] presents for 3 month follow up with Hypertension, Hyperlipidemia, T2_NIDDM and Vitamin D Deficiency.    Patient is treated for HTN since 1978 & BP has been controlled at home. Today's BP: 122/70 mmHg. Patient has had no complaints of any cardiac type chest pain, palpitations, dyspnea/orthopnea/PND, dizziness, claudication, or dependent edema.   Hyperlipidemia is controlled with diet & meds. Patient denies myalgias or other med SE's. Last Lipids were not at goal - Cholesterol 211; HDL 35; LDL 128; and elevated Triglycerides 241 on 08/19/2014.   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 FBG was 126 mg% this am.  Last A1c was  6.6% on 08/19/2014.    Further, the patient also has history of Vitamin D Deficiency of 19 in 2008 and supplements vitamin D without any suspected side-effects. Last vitamin D was 45 on 08/19/2014.  Medication Sig  . allopurinol (ZYLOPRIM) 300 MG tablet Take 1/2 to 1 tablet daily as directed to prevent Gout  . atenolol (TENORMIN) 100 MG tablet Take 1 tablet (100 mg total) by mouth daily.  . Biotin 1000 MCG tablet Take 1,000 mcg by mouth daily.  . Cholecalciferol (VITAMIN D PO) Take 5,000 Int'l Units by mouth daily.   . citalopram (CELEXA) 20 MG tablet TAKE 1 TABLET EVERY DAY  . cloNIDine (CATAPRES) 0.2 MG tablet TAKE 1 TAB 2 to 3 TIMES DAILY AS DIRECTED  . enalapril (VASOTEC) 20 MG tablet TAKE 1 TABLET TWICE DAILY  . FLAXSEED  Take 1,000 mg by mouth daily.  . furosemide (LASIX) 40 MG tablet TAKE 1 TABLET EVERY DAY  . Iron TABS Take by mouth 2 (two) times daily.  . metFORMIN -XR 500 MG  TAKE 1 TABLET THREE TIMES DAILY  . HAIR/SKIN/NAILS/BIOTIN  Take by mouth.  Marland Kitchen FISH OIL) 1000 MG  Take 1,000 mg by mouth.  Marland Kitchen K-DUR 20 MEQ tablet  Take 1 tablet (20 mEq total) by mouth daily.  . Red Yeast Rice 600 MG CAPS Take 600 mg by mouth 2 (two) times daily.   Marland Kitchen terbinafine 250 MG tablet Take 1 tablet (250 mg total) by mouth daily.  . traMADol 50 MG tablet Take 1 tablet (50 mg total) by mouth 4 (four) times daily. PRN  . verapamil (CALAN) 120 MG tablet TAKE 1 TABLET TWICE DAILY   Allergies  Allergen Reactions  . Lipitor [Atorvastatin]     Elevates LFT's   PMHx:   Past Medical History  Diagnosis Date  . Hypertension   . Carpal tunnel syndrome   . Tubulovillous adenoma of colon   . GERD (gastroesophageal reflux disease)   . Hyperlipidemia   . 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 Fatty liver   . Esophageal motility disorder   . Duodenal diverticulum   . Anxiety   . Arthritis   . Type II or unspecified type diabetes mellitus without mention of complication, not stated as uncontrolled    Immunization History  Administered Date(s) Administered  . Influenza Split 03/25/2013  . Influenza, High Dose Seasonal PF 03/15/2014  . Pneumococcal Conjugate-13 01/26/2014  . Pneumococcal Polysaccharide-23 03/14/2011  . Td 03/25/2013  . Zoster 06/03/2005   Past Surgical History  Procedure Laterality Date  . Appendectomy    . Tonsillectomy    .  Carpal tunnel release    . Cyst removal hand     FHx:    Reviewed / unchanged  SHx:    Reviewed / unchanged  Systems Review:  Constitutional: Denies fever, chills, wt changes, headaches, insomnia, fatigue, night sweats, change in appetite. Eyes: Denies redness, blurred vision, diplopia, discharge, itchy, watery eyes.  ENT: Denies discharge, congestion, post nasal drip, epistaxis, sore throat, earache, hearing loss, dental pain, tinnitus, vertigo, sinus pain, snoring.  CV: Denies chest pain, palpitations, irregular heartbeat, syncope, dyspnea, diaphoresis, orthopnea, PND, claudication or edema. Respiratory: denies cough, dyspnea, DOE, pleurisy, hoarseness, laryngitis, wheezing.   Gastrointestinal: Denies dysphagia, odynophagia, heartburn, reflux, water brash, abdominal pain or cramps, nausea, vomiting, bloating, diarrhea, constipation, hematemesis, melena, hematochezia  or hemorrhoids. Genitourinary: Denies dysuria, frequency, urgency, nocturia, hesitancy, discharge, hematuria or flank pain. Musculoskeletal: Denies arthralgias, myalgias, stiffness, jt. swelling, pain, limping or strain/sprain.  Skin: Denies pruritus, rash, hives, warts, acne, eczema or change in skin lesion(s). Neuro: No weakness, tremor, incoordination, spasms, paresthesia or pain. Psychiatric: Denies confusion, memory loss or sensory loss. Endo: Denies change in weight, skin or hair change.  Heme/Lymph: No excessive bleeding, bruising or enlarged lymph nodes.  Physical Exam  BP 122/70   Pulse 60  Temp 97 F   Resp 16  Ht 5\' 3"   Wt 148 lb     BMI 26.22   Appears well nourished and in no distress. Eyes: PERRLA, EOMs, conjunctiva no swelling or erythema. Sinuses: No frontal/maxillary tenderness ENT/Mouth: EAC's clear, TM's nl w/o erythema, bulging. Nares clear w/o erythema, swelling, exudates. Oropharynx clear without erythema or exudates. Oral hygiene is good. Tongue normal, non obstructing. Hearing intact.  Neck: Supple. Thyroid nl. Car 2+/2+ without bruits, nodes or JVD. Chest: Respirations nl with BS clear & equal w/o rales, rhonchi, wheezing or stridor.  Cor: Heart sounds normal w/ regular rate and rhythm without sig. murmurs, gallops, clicks, or rubs. Peripheral pulses normal and equal  without edema.  Abdomen: Soft & bowel sounds normal. Non-tender w/o guarding, rebound, hernias, masses, or organomegaly.  Lymphatics: Unremarkable.  Musculoskeletal: Full ROM all peripheral extremities, joint stability, 5/5 strength, and normal gait.  Skin: Warm, dry without exposed rashes, lesions or ecchymosis apparent.  Neuro: Cranial nerves intact, reflexes equal bilaterally. Sensory-motor testing  grossly intact. Tendon reflexes grossly intact.  Pysch: Alert & oriented x 3.  Insight and judgement nl & appropriate. No ideations.  Assessment and Plan:  1. Essential hypertension  - TSH  2. Mixed hyperlipidemia  - Lipid panel  3. 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 Vitamin D deficiency  - Vit D  25 hydroxy (  5. Idiopathic gout  - Uric acid  6. Gastroesophageal reflux disease   7. Medication management  - CBC with Differential/Platelet - BASIC METABOLIC PANEL WITH GFR - Hepatic function panel - Magnesium   Recommended regular exercise, BP monitoring, weight control, and discussed med and SE's. Recommended labs to assess and monitor clinical status. Further disposition pending results of labs. Over 30 minutes of exam, counseling, chart review was performed

## 2014-11-17 NOTE — Patient Instructions (Signed)

## 2014-11-18 LAB — HEMOGLOBIN A1C
Hgb A1c MFr Bld: 6.7 % — ABNORMAL HIGH (ref <5.7)
MEAN PLASMA GLUCOSE: 146 mg/dL — AB (ref <117)

## 2014-11-18 LAB — INSULIN, RANDOM: INSULIN: 30.9 u[IU]/mL — AB (ref 2.0–19.6)

## 2014-11-18 LAB — VITAMIN D 25 HYDROXY (VIT D DEFICIENCY, FRACTURES): Vit D, 25-Hydroxy: 71 ng/mL (ref 30–100)

## 2014-11-28 ENCOUNTER — Ambulatory Visit
Admission: RE | Admit: 2014-11-28 | Discharge: 2014-11-28 | Disposition: A | Discharge status: Home / Self Care | Payer: Commercial Managed Care - HMO | Source: Ambulatory Visit

## 2014-11-28 DIAGNOSIS — Z1231 Encounter for screening mammogram for malignant neoplasm of breast: Secondary | ICD-10-CM

## 2014-12-21 ENCOUNTER — Other Ambulatory Visit: Payer: Self-pay | Admitting: Physician Assistant

## 2014-12-27 ENCOUNTER — Ambulatory Visit (INDEPENDENT_AMBULATORY_CARE_PROVIDER_SITE_OTHER): Payer: Commercial Managed Care - HMO | Admitting: Internal Medicine

## 2014-12-27 ENCOUNTER — Encounter: Payer: Self-pay | Admitting: Internal Medicine

## 2014-12-27 VITALS — BP 160/92 | HR 80 | Temp 96.8°F | Resp 16 | Ht 63.0 in | Wt 148.8 lb

## 2014-12-27 DIAGNOSIS — R42 Dizziness and giddiness: Secondary | ICD-10-CM

## 2014-12-27 DIAGNOSIS — L03317 Cellulitis of buttock: Secondary | ICD-10-CM

## 2014-12-27 MED ORDER — PREDNISONE 20 MG PO TABS
ORAL_TABLET | ORAL | Status: DC
Start: 2014-12-27 — End: 2015-03-07

## 2014-12-27 MED ORDER — DOXYCYCLINE HYCLATE 100 MG PO CAPS: 100.0000 mg | ORAL_CAPSULE | Freq: Two times a day (BID) | ORAL | Status: DC

## 2014-12-27 MED ORDER — MECLIZINE HCL 25 MG PO CHEW: 25.0000 mg | CHEWABLE_TABLET | Freq: Three times a day (TID) | ORAL | Status: DC | PRN

## 2014-12-27 NOTE — Progress Notes (Signed)
Subjective:    Patient ID: Michele [name], female    DOB: 30-Dec-1945, 69 y.o.   MRN: [id number]  Dizziness Associated symptoms include congestion, nausea and a rash. Pertinent negatives include no chest pain, chills, fatigue, fever, sore throat or vomiting.   Patient reports that she has been having dizziness that has been going on for the last 4-5 days.  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  She feels like she has a lot of pressure on her head.  She does not report a vertigo like sensation at this time.  She does have some nausea.  She does report runny nose and some sinus pressure and pain.  She does not have any ear pain or changes in hearing.  She reports that she has been taking some dramamine.    She also has a small spot on her buttocks that have been there for about a week that have been non-painful.  They have been red.  No drainage.     Review of Systems  Constitutional: Negative for fever, chills and fatigue.  HENT: Positive for congestion, postnasal drip, rhinorrhea and sinus pressure. Negative for sore throat.   Eyes: Negative for photophobia and visual disturbance.  Respiratory: Negative for chest tightness, shortness of breath, wheezing and stridor.   Cardiovascular: Negative for chest pain, palpitations and leg swelling.  Gastrointestinal: Positive for nausea. Negative for vomiting, constipation and abdominal distention.  Musculoskeletal: Positive for gait problem.  Skin: Positive for rash.  Neurological: Positive for dizziness. Negative for speech difficulty and light-headedness.       Objective:   Physical Exam  Constitutional: She is oriented to person, place, and time. She appears well-developed and well-nourished. No distress.  HENT:  Head: Normocephalic and atraumatic.  Mouth/Throat: Oropharynx is clear and moist. No oropharyngeal exudate.  Eyes: Conjunctivae and EOM are normal. Pupils are equal, round, and reactive to light. No  scleral icterus.  Neck: Normal range of motion. Neck supple. No JVD present. No thyromegaly present.  Cardiovascular: Normal rate, regular rhythm, normal heart sounds and intact distal pulses.  Exam reveals no gallop and no friction rub.   No murmur heard. Pulmonary/Chest: Effort normal and breath sounds normal. No respiratory distress. She has no wheezes. She has no rales. She exhibits no tenderness.  Abdominal: Soft. Bowel sounds are normal. She exhibits no distension and no mass. There is no tenderness. There is no rebound and no guarding.  Musculoskeletal: Normal range of motion.  Lymphadenopathy:    She has no cervical adenopathy.  Neurological: She is alert and oriented to person, place, and time. She has normal strength. No cranial nerve deficit or sensory deficit. Coordination normal.  Skin: She is not diaphoretic.     Nursing note and vitals reviewed.         Assessment & Plan:    1. Dizziness and giddiness -nasal saline -claritin or zyrtec - predniSONE (DELTASONE) 20 MG tablet; 3 tabs po day one, then 2 tabs daily x 4 days  Dispense: 11 tablet; Refill: 0 - Meclizine HCl 25 MG CHEW; Chew 1 tablet (25 mg total) by mouth every 8 (eight) hours as needed.  Dispense: 90 each; Refill: 0  2. Cellulitis of buttock  - doxycycline (VIBRAMYCIN) 100 MG capsule; Take 1 capsule (100 mg total) by mouth 2 (two) times daily. One po bid x 7 days  Dispense: 14 capsule; Refill: 0   F/u if no improvement of skin lesions or if dizziness does not  improve.

## 2014-12-27 NOTE — Patient Instructions (Signed)

## 2015-02-24 ENCOUNTER — Ambulatory Visit: Payer: Self-pay | Admitting: Internal Medicine

## 2015-03-07 ENCOUNTER — Ambulatory Visit (INDEPENDENT_AMBULATORY_CARE_PROVIDER_SITE_OTHER): Payer: Commercial Managed Care - HMO | Admitting: Internal Medicine

## 2015-03-07 VITALS — BP 142/86 | HR 64 | Temp 98.2°F | Resp 16 | Ht 63.0 in | Wt 153.0 lb

## 2015-03-07 DIAGNOSIS — Z23 Encounter for immunization: Secondary | ICD-10-CM | POA: Diagnosis not present

## 2015-03-07 DIAGNOSIS — Z79899 Other long term (current) drug therapy: Secondary | ICD-10-CM

## 2015-03-07 DIAGNOSIS — E782 Mixed hyperlipidemia: Secondary | ICD-10-CM

## 2015-03-07 DIAGNOSIS — E1121 Type 2 diabetes mellitus with diabetic nephropathy: Secondary | ICD-10-CM | POA: Diagnosis not present

## 2015-03-07 DIAGNOSIS — E559 Vitamin D deficiency, unspecified: Secondary | ICD-10-CM | POA: Diagnosis not present

## 2015-03-07 DIAGNOSIS — I10 Essential (primary) hypertension: Secondary | ICD-10-CM

## 2015-03-07 LAB — CBC WITH DIFFERENTIAL/PLATELET
BASOS ABS: 0 10*3/uL (ref 0.0–0.1)
BASOS PCT: 0 % (ref 0–1)
EOS ABS: 0.1 10*3/uL (ref 0.0–0.7)
Eosinophils Relative: 2 % (ref 0–5)
HCT: 41 % (ref 36.0–46.0)
Hemoglobin: 14.2 g/dL (ref 12.0–15.0)
LYMPHS ABS: 1.8 10*3/uL (ref 0.7–4.0)
Lymphocytes Relative: 31 % (ref 12–46)
MCH: 29.1 pg (ref 26.0–34.0)
MCHC: 34.6 g/dL (ref 30.0–36.0)
MCV: 84 fL (ref 78.0–100.0)
MPV: 8.7 fL (ref 8.6–12.4)
Monocytes Absolute: 0.7 10*3/uL (ref 0.1–1.0)
Monocytes Relative: 12 % (ref 3–12)
NEUTROS PCT: 55 % (ref 43–77)
Neutro Abs: 3.2 10*3/uL (ref 1.7–7.7)
Platelets: 179 10*3/uL (ref 150–400)
RBC: 4.88 MIL/uL (ref 3.87–5.11)
RDW: 14.8 % (ref 11.5–15.5)
WBC: 5.8 10*3/uL (ref 4.0–10.5)

## 2015-03-07 LAB — HEPATIC FUNCTION PANEL
ALBUMIN: 4.1 g/dL (ref 3.6–5.1)
ALT: 33 U/L — AB (ref 6–29)
AST: 23 U/L (ref 10–35)
Alkaline Phosphatase: 77 U/L (ref 33–130)
Bilirubin, Direct: 0.2 mg/dL (ref <=0.2)
Indirect Bilirubin: 0.7 mg/dL (ref 0.2–1.2)
Total Bilirubin: 0.9 mg/dL (ref 0.2–1.2)
Total Protein: 6.2 g/dL (ref 6.1–8.1)

## 2015-03-07 LAB — BASIC METABOLIC PANEL WITH GFR
BUN: 11 mg/dL (ref 7–25)
CALCIUM: 9.6 mg/dL (ref 8.6–10.4)
CO2: 29 mmol/L (ref 20–31)
CREATININE: 0.78 mg/dL (ref 0.50–0.99)
Chloride: 102 mmol/L (ref 98–110)
GFR, Est African American: >89 mL/min (ref >=60)
GFR, Est Non African American: 78 mL/min (ref >=60)
GLUCOSE: 135 mg/dL — AB (ref 65–99)
Potassium: 4.1 mmol/L (ref 3.5–5.3)
Sodium: 140 mmol/L (ref 135–146)

## 2015-03-07 LAB — LIPID PANEL
CHOLESTEROL: 200 mg/dL (ref 125–200)
HDL: 32 mg/dL — ABNORMAL LOW (ref >=46)
LDL Cholesterol: 97 mg/dL (ref <130)
Total CHOL/HDL Ratio: 6.3 Ratio — ABNORMAL HIGH (ref <=5.0)
Triglycerides: 357 mg/dL — ABNORMAL HIGH (ref <150)
VLDL: 71 mg/dL — AB (ref <30)

## 2015-03-07 LAB — MAGNESIUM: MAGNESIUM: 1.9 mg/dL (ref 1.5–2.5)

## 2015-03-07 LAB — TSH: TSH: 0.581 u[IU]/mL (ref 0.350–4.500)

## 2015-03-07 MED ORDER — PREDNISOLONE 15 MG/5ML PO SOLN: 5.0000 mg | Freq: Every day | ORAL | Status: DC

## 2015-03-07 NOTE — Addendum Note (Signed)
Addended by: Jaimie Pippins A on: 03/07/2015 10:45 AM   Modules accepted: Orders

## 2015-03-07 NOTE — Progress Notes (Signed)
Patient ID: Michele [name], female   DOB: 1946-02-08, 69 y.o.   MRN: [id number]  Assessment and Plan:  Hypertension:  -Continue medication -monitor blood pressure at home. -Continue DASH diet -Reminder to go to the ER if any CP, SOB, nausea, dizziness, severe HA, changes vision/speech, left arm numbness and tingling and jaw pain.  Cholesterol - Continue diet and exercise -Check cholesterol.   Diabetes without complications -Continue diet and exercise.  -Check A1C  Vitamin D Def -check level -continue medications.   Scalp itching -looks almost eczematous -try prednisolone soln -recommended gentle shampoo -recommend increasing water and moisture  Continue diet and meds as discussed. Further disposition pending results of labs. Discussed med's effects and SE's.    HPI 69 y.o. female  presents for 3 month follow up with hypertension, hyperlipidemia, diabetes and vitamin D deficiency.   Her blood pressure has been controlled at home, today their BP is BP: (!) 178/92 mmHg.She does workout. She denies chest pain, shortness of breath, dizziness.   She is on cholesterol medication and denies myalgias. Her cholesterol is at goal. The cholesterol was:  11/17/2014: Cholesterol 219*; HDL 38*; LDL Cholesterol 132*; Triglycerides 244*   She has been working on diet and exercise for diabetes without complications, she is on bASA, she is on ACE/ARB, and denies  foot ulcerations, hyperglycemia, hypoglycemia , increased appetite, nausea, paresthesia of the feet, polydipsia, polyuria, visual disturbances, vomiting and weight loss. Last A1C was: 11/17/2014: Hgb A1c MFr Bld 6.7*.  She reports that she hasn't been checking sugars much during the day.  She reports that she has been having an unusual diet lately because they have been so busy.     Patient is on Vitamin D supplement. 11/17/2014: Vit D, 25-Hydroxy 71  Current Medications:  Current Outpatient Prescriptions on File Prior to Visit   Medication Sig Dispense Refill  . atenolol (TENORMIN) 100 MG tablet TAKE 1 TABLET EVERY DAY 90 tablet 3  . Biotin 1000 MCG tablet Take 1,000 mcg by mouth daily.    . Blood Glucose Monitoring Suppl (ACCU-CHEK NANO SMARTVIEW) W/DEVICE KIT CHECK BLOOD GLUCOSE THREE TIMES DAILY BEFORE MEALS 1 kit 0  . Cholecalciferol (VITAMIN D PO) Take 5,000 Int'l Units by mouth daily.     . cloNIDine (CATAPRES) 0.2 MG tablet TAKE 1 TABLET TWO TIMES DAILY, UNLESS PRESSURE IS MORE THAN 160 THEN 3 TIMES DAILY AS DIRECTED 270 tablet 3  . enalapril (VASOTEC) 20 MG tablet TAKE 1 TABLET TWICE DAILY 180 tablet 99  . Flaxseed, Linseed, (FLAXSEED OIL PO) Take 1,000 mg by mouth daily.    . furosemide (LASIX) 40 MG tablet TAKE 1 TABLET EVERY DAY 90 tablet 99  . glucose blood test strip 1 each by Other route 3 (three) times daily. Use as instructed.check glucose TID AC 100 each 12  . Iron TABS Take by mouth 2 (two) times daily.    . metFORMIN (GLUCOPHAGE-XR) 500 MG 24 hr tablet TAKE 1 TABLET THREE TIMES DAILY 270 tablet 3  . Omega-3 Fatty Acids (FISH OIL) 1000 MG CAPS Take 1,000 mg by mouth.    . potassium chloride SA (K-DUR,KLOR-CON) 20 MEQ tablet Take 1 tablet (20 mEq total) by mouth daily. 90 tablet 3  . Red Yeast Rice 600 MG CAPS Take 600 mg by mouth 2 (two) times daily.     . verapamil (CALAN) 120 MG tablet TAKE 1 TABLET TWICE DAILY 180 tablet 3  . traMADol (ULTRAM) 50 MG tablet Take 1 tablet (50 mg total)  by mouth 4 (four) times daily. PRN (Patient not taking: Reported on 03/07/2015) 360 tablet 1   No current facility-administered medications on file prior to visit.   Medical History:  Past Medical History  Diagnosis Date  . Hypertension   . Carpal tunnel syndrome   . Tubulovillous adenoma of colon   . GERD (gastroesophageal reflux disease)   . Hyperlipidemia   . 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 Fatty liver   . Esophageal motility disorder   . Duodenal diverticulum   . Anxiety   . Arthritis   . Type II or  unspecified type diabetes mellitus without mention of complication, not stated as uncontrolled    Allergies:  Allergies  Allergen Reactions  . Lipitor [Atorvastatin]     Elevates LFT's     Review of Systems:  Review of Systems  Constitutional: Negative for fever, chills and malaise/fatigue.  HENT: Negative for congestion, nosebleeds and sore throat.   Eyes: Negative.   Respiratory: Negative for cough, shortness of breath and wheezing.   Cardiovascular: Negative for chest pain, palpitations and leg swelling.  Gastrointestinal: Negative for heartburn, abdominal pain, diarrhea, constipation, blood in stool and melena.  Genitourinary: Negative.   Skin: Negative.   Neurological: Negative for dizziness, sensory change, loss of consciousness and headaches.  Psychiatric/Behavioral: Negative for depression. The patient is not nervous/anxious and does not have insomnia.     Family history- Review and unchanged  Social history- Review and unchanged  Physical Exam: BP 178/92 mmHg  Pulse 64  Temp(Src) 98.2 F (36.8 C) (Temporal)  Resp 16  Ht 5' 3" (1.6 m)  Wt 153 lb (69.4 kg)  BMI 27.11 kg/m2 Wt Readings from Last 3 Encounters:  03/07/15 153 lb (69.4 kg)  12/27/14 148 lb 12.8 oz (67.495 kg)  11/17/14 148 lb (67.132 kg)   General Appearance: Well nourished well developed, non-toxic appearing, in no apparent distress. Eyes: PERRLA, EOMs, conjunctiva no swelling or erythema ENT/Mouth: Ear canals clear with no erythema, swelling, or discharge.  TMs normal bilaterally, oropharynx clear, moist, with no exudate.   Neck: Supple, thyroid normal, no JVD, no cervical adenopathy.  Respiratory: Respiratory effort normal, breath sounds clear A&P, no wheeze, rhonchi or rales noted.  No retractions, no accessory muscle usage Cardio: RRR with no MRGs. No noted edema.  Abdomen: Soft, + BS.  Non tender, no guarding, rebound, hernias, masses. Musculoskeletal: Full ROM, 5/5 strength, Normal gait Skin:  Warm, dry without rashes, lesions, ecchymosis.  Neuro: Awake and oriented X 3, Cranial nerves intact. No cerebellar symptoms.  Psych: normal affect, Insight and Judgment appropriate.    Starlyn Skeans, PA-C 10:25 AM [facility]

## 2015-03-07 NOTE — Patient Instructions (Signed)
Place a thin layer of the prednisolone solution on your scalp.  You only need a very thin amount to cover the scalp.  Use gentle shampoos on your scalp.  Please make sure you use vaseline or aquaphor on your skin when you get out of the shower.  Pat skin dry so it is still damp and put it on while the room is still steamy and humid.

## 2015-03-08 LAB — HEMOGLOBIN A1C
Hgb A1c MFr Bld: 6.6 % — ABNORMAL HIGH (ref <5.7)
Mean Plasma Glucose: 143 mg/dL — ABNORMAL HIGH (ref <117)

## 2015-03-08 LAB — INSULIN, RANDOM: INSULIN: 31 u[IU]/mL — AB (ref 2.0–19.6)

## 2015-03-08 LAB — VITAMIN D 25 HYDROXY (VIT D DEFICIENCY, FRACTURES): Vit D, 25-Hydroxy: 69 ng/mL (ref 30–100)

## 2015-03-17 ENCOUNTER — Other Ambulatory Visit: Payer: Self-pay | Admitting: Internal Medicine

## 2015-04-25 ENCOUNTER — Other Ambulatory Visit: Payer: Self-pay | Admitting: Internal Medicine

## 2015-04-28 ENCOUNTER — Encounter: Payer: Self-pay | Admitting: Cardiovascular Disease

## 2015-05-03 ENCOUNTER — Other Ambulatory Visit: Payer: Self-pay | Admitting: Physician Assistant

## 2015-05-18 ENCOUNTER — Encounter: Payer: Self-pay | Admitting: Internal Medicine

## 2015-06-21 ENCOUNTER — Encounter: Payer: Self-pay | Admitting: Internal Medicine

## 2015-07-03 DIAGNOSIS — H1851 Endothelial corneal dystrophy: Secondary | ICD-10-CM | POA: Diagnosis not present

## 2015-07-03 DIAGNOSIS — E119 Type 2 diabetes mellitus without complications: Secondary | ICD-10-CM | POA: Diagnosis not present

## 2015-07-03 DIAGNOSIS — H5213 Myopia, bilateral: Secondary | ICD-10-CM | POA: Diagnosis not present

## 2015-07-05 DIAGNOSIS — D2239 Melanocytic nevi of other parts of face: Secondary | ICD-10-CM | POA: Diagnosis not present

## 2015-07-05 DIAGNOSIS — I788 Other diseases of capillaries: Secondary | ICD-10-CM | POA: Diagnosis not present

## 2015-07-05 DIAGNOSIS — L814 Other melanin hyperpigmentation: Secondary | ICD-10-CM | POA: Diagnosis not present

## 2015-07-05 DIAGNOSIS — D692 Other nonthrombocytopenic purpura: Secondary | ICD-10-CM | POA: Diagnosis not present

## 2015-07-05 DIAGNOSIS — L603 Nail dystrophy: Secondary | ICD-10-CM | POA: Diagnosis not present

## 2015-07-05 DIAGNOSIS — L570 Actinic keratosis: Secondary | ICD-10-CM | POA: Diagnosis not present

## 2015-07-05 DIAGNOSIS — L738 Other specified follicular disorders: Secondary | ICD-10-CM | POA: Diagnosis not present

## 2015-07-13 ENCOUNTER — Encounter: Payer: Self-pay | Admitting: Internal Medicine

## 2015-07-13 NOTE — Patient Instructions (Signed)

## 2015-07-13 NOTE — Progress Notes (Signed)
Patient ID: Michele [name], female   DOB: February 02, 1946, 70 y.o.   MRN: [id number]  Annual Screening/Preventative Visit And Comprehensive Evaluation &  Examination  This very nice 70 y.o. [name] presents for presents for a Wellness/Preventative Visit & comprehensive evaluation and management of multiple medical co-morbidities.  Patient has been followed for HTN, T2_NIDDM, Hyperlipidemia and Vitamin D Deficiency.    HTN predates circa 1978. Patient's BP has been controlled at home and patient denies any cardiac symptoms as chest pain, palpitations, shortness of breath, dizziness or ankle swelling. Today's BP: 126/82 mmHg    Patient's hyperlipidemia is controlled with diet and medications. Patient denies myalgias or other medication SE's. Last lipids were at goal with  Cholesterol 200; HDL 32*; LDL 97; but elevated Triglycerides 357 on 03/07/2015.    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 She reports FBG's range <120 mg%. Last A1c was 6.6% on 03/07/2015.   Finally, patient has history of Vitamin D Deficiency of "62" in 2008  and last Vitamin D was 69 on 03/07/2015.    Medication Sig  . atenolol  100 MG tablet TAKE 1 TABLET EVERY DAY  . Biotin 1000 MCG Take 1,000 mcg by mouth daily.  Marland Kitchen VITAMIN D Take 5,000 Int'l Units by mouth daily.   . citalopram  20 MG TAKE 1 TABLET EVERY DAY  . cloNIDine  0.2 MG  TAKE 1 TABLET TWO TIMES DAILY, UNLESS PRESSURE IS MORE THAN 160 THEN 3 TIMES DAILY AS DIRECTED  . enalapril  20 MG  TAKE 1 TABLET TWICE DAILY  . FLAXSEED OIL Take 1,000 mg by mouth daily.  . furosemide 40 MG TAKE 1 TABLET EVERY DAY  . Iron TABS Take by mouth 2 (two) times daily.  . metFORMIN-XR 500 MG TAKE 1 TABLET THREE TIMES DAILY  . Omega-3 FFISH OIL 1000 MG Take 1,000 mg by mouth.  Marland Kitchen K-DUR 20 MEQ  Take 1 tablet (20 mEq total) by mouth daily.  . prednisoLONE (PRELONE) 15 MG/5ML SOLN Take 1.7 mLs (5.1  mg total) by mouth daily before breakfast.  . Red Yeast Rice 600 MG  Take 600 mg by mouth 2 (two) times daily.   . verapamil  120 MG tablet TAKE 1 TABLET TWICE DAILY    Allergies  Allergen Reactions  . Lipitor [Atorvastatin]     Elevates LFT's   Past Medical History  Diagnosis Date  . Hypertension   . Carpal tunnel syndrome   . Tubulovillous adenoma of colon   . GERD (gastroesophageal reflux disease)   . Hyperlipidemia   . 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 Fatty liver   . Esophageal motility disorder   . Duodenal diverticulum   . Anxiety   . Arthritis   . Type II or unspecified type diabetes mellitus without mention of complication, not stated as uncontrolled    Health Maintenance  Topic Date Due  . Hepatitis C Screening  1945/08/09  . FOOT EXAM  05/04/2015  . OPHTHALMOLOGY EXAM  06/28/2015  . HEMOGLOBIN A1C  09/05/2015  . INFLUENZA VACCINE  01/02/2016  . MAMMOGRAM  11/27/2016  . COLONOSCOPY  09/22/2017  . TETANUS/TDAP  03/26/2023  . DEXA SCAN  Completed  . ZOSTAVAX  Completed  . PNA vac Low Risk Adult  Completed   Immunization History  Administered Date(s) Administered  . Influenza Split 03/25/2013  . Influenza, High Dose Seasonal PF 03/15/2014, 03/07/2015  . Pneumococcal Conjugate-13 01/26/2014  .  Pneumococcal Polysaccharide-23 03/14/2011  . Td 03/25/2013  . Zoster 06/03/2005   Past Surgical History  Procedure Laterality Date  . Appendectomy    . Tonsillectomy    . Carpal tunnel release    . Cyst removal hand     Family History  Problem Relation Age of Onset  . Heart disease Mother   . Heart disease Father   . Heart disease Brother     x2  . Diabetes Mother   . Diabetes Father   . Colon polyps Mother    Social History  Substance Use Topics  . Smoking status: Never Smoker   . Smokeless tobacco: Never Used  . Alcohol Use: No    ROS Constitutional: Denies fever, chills, weight loss/gain, headaches, insomnia,  night sweats, and change in appetite.  Does c/o fatigue. Eyes: Denies redness, blurred vision, diplopia, discharge, itchy, watery eyes.  ENT: Denies discharge, congestion, post nasal drip, epistaxis, sore throat, earache, hearing loss, dental pain, Tinnitus, Vertigo, Sinus pain, snoring.  Cardio: Denies chest pain, palpitations, irregular heartbeat, syncope, dyspnea, diaphoresis, orthopnea, PND, claudication, edema Respiratory: denies cough, dyspnea, DOE, pleurisy, hoarseness, laryngitis, wheezing.  Gastrointestinal: Denies dysphagia, heartburn, reflux, water brash, pain, cramps, nausea, vomiting, bloating, diarrhea, constipation, hematemesis, melena, hematochezia, jaundice, hemorrhoids Genitourinary: Denies dysuria, frequency, urgency, nocturia, hesitancy, discharge, hematuria, flank pain Breast: Breast lumps, nipple discharge, bleeding.  Musculoskeletal: Denies arthralgia, myalgia, stiffness, Jt. Swelling, pain, limp, and strain/sprain. Denies falls. Skin: Denies puritis, rash, hives, warts, acne, eczema, changing in skin lesion Neuro: No weakness, tremor, incoordination, spasms, paresthesia, pain Psychiatric: Denies confusion, memory loss, sensory loss. Denies Depression. Endocrine: Denies change in weight, skin, hair change, nocturia, and paresthesia, diabetic polys, visual blurring, hyper / hypo glycemic episodes.  Heme/Lymph: No excessive bleeding, bruising, enlarged lymph nodes.  Physical Exam  BP 126/82 mmHg  Pulse 60  Temp(Src) 97.5 F (36.4 C)  Resp 16  Ht 5' 3.5" (1.613 m)  Wt 151 lb 6.4 oz (68.675 kg)  BMI 26.40 kg/m2  General Appearance: Well nourished and in no apparent distress. Eyes: PERRLA, EOMs, conjunctiva no swelling or erythema, normal fundi and vessels. Sinuses: No frontal/maxillary tenderness ENT/Mouth: EACs patent / TMs  nl. Nares clear without erythema, swelling, mucoid exudates. Oral hygiene is good. No erythema, swelling, or exudate. Tongue normal, non-obstructing. Tonsils not swollen or  erythematous. Hearing normal.  Neck: Supple, thyroid normal. No bruits, nodes or JVD. Respiratory: Respiratory effort normal.  BS equal and clear bilateral without rales, rhonci, wheezing or stridor. Cardio: Heart sounds are normal with regular rate and rhythm and no murmurs, rubs or gallops. Peripheral pulses are normal and equal bilaterally without edema. No aortic or femoral bruits. Chest: symmetric with normal excursions and percussion. Breasts: Symmetric, without lumps, nipple discharge, retractions, or fibrocystic changes.  Abdomen: Flat, soft, with bowl sounds. Nontender, no guarding, rebound, hernias, masses, or organomegaly.  Lymphatics: Non tender without lymphadenopathy.  Genitourinary:  Musculoskeletal: Full ROM all peripheral extremities, joint stability, 5/5 strength, and normal gait. Skin: Warm and dry without rashes, lesions, cyanosis, clubbing or  ecchymosis.  Neuro: Cranial nerves intact, reflexes equal bilaterally. Normal muscle tone, no cerebellar symptoms. Sensation intact to touch, Vibratory and Monofilament testing bilaterally.  Pysch: Alert and oriented X 3, normal affect, Insight and Judgment appropriate.   Assessment and Plan  1. Annual Preventative Screening Examination   - Microalbumin / creatinine urine ratio - EKG 12-Lead - [place], RETROPERITNL ABD,  LTD - POC Hemoccult Bld/Stl  - Urinalysis, Routine w reflex microscopic  -  CBC with Differential/Platelet - BASIC METABOLIC PANEL WITH GFR - Hepatic function panel - Magnesium - Lipid panel - TSH - Hemoglobin A1c - Insulin, random - VITAMIN D 25 Hydroxy - LOW EXTREMITY NEUR EXAM DOCUM - HM DIABETES FOOT EXAM  2. Essential hypertension  - EKG 12-Lead - [place], RETROPERITNL ABD,  LTD - TSH  3. Mixed hyperlipidemia  - Lipid panel - TSH  4. 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 Vitamin D deficiency  - VITAMIN D 25 Hydroxy  6. Idiopathic gout   7. Gastroesophageal reflux disease   8. Screening for rectal cancer  - POC Hemoccult Bld/Stl   9. Depression screen   10. Medication management  - Urinalysis, Routine w reflex microscopic  - CBC with Differential/Platelet - BASIC METABOLIC PANEL WITH GFR - Hepatic function panel - Magnesium   Continue prudent diet as discussed, weight control, BP monitoring, regular exercise, and medications. Discussed med's effects and SE's. Screening labs and tests as requested with regular follow-up as recommended. Over 40 minutes of exam, counseling, chart review and high complex critical decision making was performed.

## 2015-07-14 ENCOUNTER — Encounter: Payer: Self-pay | Admitting: Internal Medicine

## 2015-07-14 ENCOUNTER — Other Ambulatory Visit: Payer: Self-pay | Admitting: *Deleted

## 2015-07-14 ENCOUNTER — Ambulatory Visit (INDEPENDENT_AMBULATORY_CARE_PROVIDER_SITE_OTHER): Payer: PPO | Admitting: Internal Medicine

## 2015-07-14 VITALS — BP 126/82 | HR 60 | Temp 97.5°F | Resp 16 | Ht 63.5 in | Wt 151.4 lb

## 2015-07-14 DIAGNOSIS — Z1331 Encounter for screening for depression: Secondary | ICD-10-CM

## 2015-07-14 DIAGNOSIS — Z1212 Encounter for screening for malignant neoplasm of rectum: Secondary | ICD-10-CM

## 2015-07-14 DIAGNOSIS — I10 Essential (primary) hypertension: Secondary | ICD-10-CM

## 2015-07-14 DIAGNOSIS — E1121 Type 2 diabetes mellitus with diabetic nephropathy: Secondary | ICD-10-CM

## 2015-07-14 DIAGNOSIS — Z0001 Encounter for general adult medical examination with abnormal findings: Secondary | ICD-10-CM | POA: Diagnosis not present

## 2015-07-14 DIAGNOSIS — E559 Vitamin D deficiency, unspecified: Secondary | ICD-10-CM

## 2015-07-14 DIAGNOSIS — Z79899 Other long term (current) drug therapy: Secondary | ICD-10-CM

## 2015-07-14 DIAGNOSIS — E1129 Type 2 diabetes mellitus with other diabetic kidney complication: Secondary | ICD-10-CM | POA: Diagnosis not present

## 2015-07-14 DIAGNOSIS — E782 Mixed hyperlipidemia: Secondary | ICD-10-CM

## 2015-07-14 DIAGNOSIS — M1000 Idiopathic gout, unspecified site: Secondary | ICD-10-CM

## 2015-07-14 DIAGNOSIS — Z0000 Encounter for general adult medical examination without abnormal findings: Secondary | ICD-10-CM

## 2015-07-14 DIAGNOSIS — R6889 Other general symptoms and signs: Secondary | ICD-10-CM | POA: Diagnosis not present

## 2015-07-14 DIAGNOSIS — K219 Gastro-esophageal reflux disease without esophagitis: Secondary | ICD-10-CM

## 2015-07-14 LAB — BASIC METABOLIC PANEL WITH GFR
BUN: 15 mg/dL (ref 7–25)
CALCIUM: 9.7 mg/dL (ref 8.6–10.4)
CO2: 31 mmol/L (ref 20–31)
Chloride: 97 mmol/L — ABNORMAL LOW (ref 98–110)
Creat: 0.93 mg/dL (ref 0.50–0.99)
GFR, EST AFRICAN AMERICAN: 73 mL/min (ref >=60)
GFR, EST NON AFRICAN AMERICAN: 63 mL/min (ref >=60)
Glucose, Bld: 126 mg/dL — ABNORMAL HIGH (ref 65–99)
Potassium: 3.8 mmol/L (ref 3.5–5.3)
SODIUM: 140 mmol/L (ref 135–146)

## 2015-07-14 LAB — CBC WITH DIFFERENTIAL/PLATELET
Basophils Absolute: 0.1 10*3/uL (ref 0.0–0.1)
Basophils Relative: 1 % (ref 0–1)
Eosinophils Absolute: 0.5 10*3/uL (ref 0.0–0.7)
Eosinophils Relative: 8 % — ABNORMAL HIGH (ref 0–5)
HCT: 41 % (ref 36.0–46.0)
Hemoglobin: 13.6 g/dL (ref 12.0–15.0)
Lymphocytes Relative: 32 % (ref 12–46)
Lymphs Abs: 1.9 10*3/uL (ref 0.7–4.0)
MCH: 27.8 pg (ref 26.0–34.0)
MCHC: 33.2 g/dL (ref 30.0–36.0)
MCV: 83.7 fL (ref 78.0–100.0)
MPV: 8.8 fL (ref 8.6–12.4)
Monocytes Absolute: 0.6 10*3/uL (ref 0.1–1.0)
Monocytes Relative: 10 % (ref 3–12)
Neutro Abs: 2.9 10*3/uL (ref 1.7–7.7)
Neutrophils Relative %: 49 % (ref 43–77)
Platelets: 194 10*3/uL (ref 150–400)
RBC: 4.9 MIL/uL (ref 3.87–5.11)
RDW: 15.5 % (ref 11.5–15.5)
WBC: 6 10*3/uL (ref 4.0–10.5)

## 2015-07-14 LAB — HEPATIC FUNCTION PANEL
ALT: 44 U/L — ABNORMAL HIGH (ref 6–29)
AST: 31 U/L (ref 10–35)
Albumin: 4.2 g/dL (ref 3.6–5.1)
Alkaline Phosphatase: 95 U/L (ref 33–130)
BILIRUBIN DIRECT: 0.2 mg/dL (ref <=0.2)
BILIRUBIN INDIRECT: 1 mg/dL (ref 0.2–1.2)
TOTAL PROTEIN: 6.6 g/dL (ref 6.1–8.1)
Total Bilirubin: 1.2 mg/dL (ref 0.2–1.2)

## 2015-07-14 LAB — LIPID PANEL
CHOL/HDL RATIO: 5.6 ratio — AB (ref <=5.0)
CHOLESTEROL: 208 mg/dL — AB (ref 125–200)
HDL: 37 mg/dL — ABNORMAL LOW (ref >=46)
LDL Cholesterol: 124 mg/dL (ref <130)
TRIGLYCERIDES: 235 mg/dL — AB (ref <150)
VLDL: 47 mg/dL — AB (ref <30)

## 2015-07-14 LAB — MAGNESIUM: MAGNESIUM: 2.2 mg/dL (ref 1.5–2.5)

## 2015-07-14 MED ORDER — ATENOLOL 100 MG PO TABS: 100.0000 mg | ORAL_TABLET | Freq: Every day | ORAL | Status: DC

## 2015-07-14 MED ORDER — CLONIDINE HCL 0.2 MG PO TABS: ORAL_TABLET | ORAL | Status: DC

## 2015-07-15 LAB — TSH: TSH: 0.43 mIU/L

## 2015-07-15 LAB — URINALYSIS, ROUTINE W REFLEX MICROSCOPIC
BILIRUBIN URINE: NEGATIVE
Glucose, UA: NEGATIVE
HGB URINE DIPSTICK: NEGATIVE
KETONES UR: NEGATIVE
Leukocytes, UA: NEGATIVE
Nitrite: NEGATIVE
PROTEIN: NEGATIVE
Specific Gravity, Urine: 1.018 (ref 1.001–1.035)
pH: 6.5 (ref 5.0–8.0)

## 2015-07-15 LAB — MICROALBUMIN / CREATININE URINE RATIO
Creatinine, Urine: 124 mg/dL (ref 20–320)
MICROALB/CREAT RATIO: 13 ug/mg{creat} (ref <30)
Microalb, Ur: 1.6 mg/dL

## 2015-07-15 LAB — HEMOGLOBIN A1C
Hgb A1c MFr Bld: 6.8 % — ABNORMAL HIGH (ref <5.7)
MEAN PLASMA GLUCOSE: 148 mg/dL — AB (ref <117)

## 2015-07-15 LAB — VITAMIN D 25 HYDROXY (VIT D DEFICIENCY, FRACTURES): VIT D 25 HYDROXY: 58 ng/mL (ref 30–100)

## 2015-07-17 LAB — INSULIN, RANDOM: Insulin: 10.8 u[IU]/mL (ref 2.0–19.6)

## 2015-08-21 DIAGNOSIS — R51 Headache: Secondary | ICD-10-CM | POA: Diagnosis not present

## 2015-08-22 ENCOUNTER — Observation Stay (HOSPITAL_COMMUNITY)
Admission: EM | Admit: 2015-08-22 | Discharge: 2015-08-25 | Disposition: A | Discharge status: Home / Self Care | Payer: PPO | Attending: Internal Medicine | Admitting: Internal Medicine

## 2015-08-22 ENCOUNTER — Ambulatory Visit (INDEPENDENT_AMBULATORY_CARE_PROVIDER_SITE_OTHER): Payer: PPO | Admitting: Internal Medicine

## 2015-08-22 ENCOUNTER — Encounter: Payer: Self-pay | Admitting: Internal Medicine

## 2015-08-22 VITALS — BP 210/98 | HR 64 | Temp 98.2°F | Resp 16 | Ht 63.5 in | Wt 152.0 lb

## 2015-08-22 DIAGNOSIS — N182 Chronic kidney disease, stage 2 (mild): Secondary | ICD-10-CM | POA: Diagnosis not present

## 2015-08-22 DIAGNOSIS — H539 Unspecified visual disturbance: Secondary | ICD-10-CM | POA: Diagnosis not present

## 2015-08-22 DIAGNOSIS — R519 Headache, unspecified: Secondary | ICD-10-CM

## 2015-08-22 DIAGNOSIS — I129 Hypertensive chronic kidney disease with stage 1 through stage 4 chronic kidney disease, or unspecified chronic kidney disease: Principal | ICD-10-CM | POA: Insufficient documentation

## 2015-08-22 DIAGNOSIS — E785 Hyperlipidemia, unspecified: Secondary | ICD-10-CM | POA: Diagnosis not present

## 2015-08-22 DIAGNOSIS — R51 Headache: Secondary | ICD-10-CM | POA: Diagnosis not present

## 2015-08-22 DIAGNOSIS — E782 Mixed hyperlipidemia: Secondary | ICD-10-CM | POA: Diagnosis not present

## 2015-08-22 DIAGNOSIS — G459 Transient cerebral ischemic attack, unspecified: Secondary | ICD-10-CM

## 2015-08-22 DIAGNOSIS — K219 Gastro-esophageal reflux disease without esophagitis: Secondary | ICD-10-CM | POA: Insufficient documentation

## 2015-08-22 DIAGNOSIS — R262 Difficulty in walking, not elsewhere classified: Secondary | ICD-10-CM | POA: Diagnosis not present

## 2015-08-22 DIAGNOSIS — E1122 Type 2 diabetes mellitus with diabetic chronic kidney disease: Secondary | ICD-10-CM | POA: Diagnosis not present

## 2015-08-22 DIAGNOSIS — I2510 Atherosclerotic heart disease of native coronary artery without angina pectoris: Secondary | ICD-10-CM | POA: Diagnosis not present

## 2015-08-22 DIAGNOSIS — I639 Cerebral infarction, unspecified: Secondary | ICD-10-CM

## 2015-08-22 DIAGNOSIS — Z7984 Long term (current) use of oral hypoglycemic drugs: Secondary | ICD-10-CM | POA: Diagnosis not present

## 2015-08-22 DIAGNOSIS — I10 Essential (primary) hypertension: Secondary | ICD-10-CM | POA: Diagnosis present

## 2015-08-22 DIAGNOSIS — I6350 Cerebral infarction due to unspecified occlusion or stenosis of unspecified cerebral artery: Secondary | ICD-10-CM | POA: Diagnosis not present

## 2015-08-22 DIAGNOSIS — I160 Hypertensive urgency: Secondary | ICD-10-CM | POA: Diagnosis not present

## 2015-08-22 DIAGNOSIS — Z79899 Other long term (current) drug therapy: Secondary | ICD-10-CM | POA: Diagnosis not present

## 2015-08-22 DIAGNOSIS — I161 Hypertensive emergency: Secondary | ICD-10-CM

## 2015-08-22 DIAGNOSIS — R930 Abnormal findings on diagnostic imaging of skull and head, not elsewhere classified: Secondary | ICD-10-CM | POA: Diagnosis not present

## 2015-08-22 DIAGNOSIS — N183 Chronic kidney disease, stage 3 (moderate): Secondary | ICD-10-CM

## 2015-08-22 NOTE — Progress Notes (Signed)
Subjective:    Patient ID: Michele [name], female    DOB: June 23, 1945, 70 y.o.   MRN: [id number]  HPI  Patient presents to the office for evaluation of headache and pressure behind her eyes.  She reports that she has been having some visual changes.  She reports that she has recently seen the eye doctor and that there didn't appear to be any type of issues with her eyes.  She reports that she has also been having some pain around her temples too.  She notes that her blood pressure has been running 180/87-190/87.  She reports that her blood pressure does get a little bit better at home.  She has been taking 2 enalapril at home.  She does report that she has taken all of her regular medications.  She did not take lasix this morning.  She is not sure whether she is taking   Review of Systems  Constitutional: Positive for fatigue. Negative for fever and chills.  Eyes: Positive for visual disturbance (notes some blurry vision sometimes).  Respiratory: Negative for chest tightness and shortness of breath.   Cardiovascular: Negative for chest pain and palpitations.  Gastrointestinal: Negative for nausea and vomiting.  Genitourinary: Negative for dysuria, frequency, hematuria and difficulty urinating.  Neurological: Positive for light-headedness and headaches. Negative for dizziness, syncope and weakness.       Objective:   Physical Exam  Constitutional: She is oriented to person, place, and time. She appears well-developed and well-nourished. No distress.  HENT:  Head: Normocephalic.  Mouth/Throat: Oropharynx is clear and moist. No oropharyngeal exudate.  Eyes: Conjunctivae and EOM are normal. Pupils are equal, round, and reactive to light. No scleral icterus.  Neck: Normal range of motion. Neck supple. No JVD present. No thyromegaly present.  Cardiovascular: Normal rate, regular rhythm, normal heart sounds and intact distal pulses.  Exam reveals no gallop and no friction rub.   No murmur  heard. Pulmonary/Chest: Effort normal and breath sounds normal. No respiratory distress. She has no wheezes. She has no rales. She exhibits no tenderness.  Abdominal: Soft. Bowel sounds are normal. She exhibits no distension and no mass. There is no tenderness. There is no rebound and no guarding.  Musculoskeletal: Normal range of motion.  Lymphadenopathy:    She has no cervical adenopathy.  Neurological: She is alert and oriented to person, place, and time. She has normal strength. No cranial nerve deficit or sensory deficit. Gait abnormal. GCS eye subscore is 4. GCS verbal subscore is 5. GCS motor subscore is 6.  Wobbly gait.  Difficulty with getting up and down from the exam table.  Speech slow but clear.  Normal rapid alternating hand movement.  Difficulty with opposition to fingers.  Normal heel to shin.  Mild overshoot bilaterally with finger to nose.    Skin: Skin is warm and dry. She is not diaphoretic.  Psychiatric: She has a normal mood and affect. Her behavior is normal. Judgment and thought content normal.  Nursing note and vitals reviewed.   Filed Vitals:   08/22/15 1436  BP: 210/98  Pulse: 64  Temp: 98.2 F (36.8 C)  Resp: 16          Assessment & Plan:    1. Hypertensive urgency -patient took a clonidine 0.2 mg tablet here without lowering of BP -given unsteady gait and also mild coordination deficits and headaches with blurry vision do not feel patient is safe to be discharged to home.  Spoke with Dr. Melford Aase who  agrees that given patient not knowing her medication routine and not being reliable that patient should be evaluated in the ER for signs of end organ damage.  Will see back after hospital evaluation.  Husband will be transporting patient to ER.  ER form has been faxed over.

## 2015-08-22 NOTE — ED Notes (Signed)
Pt states that she has been having headaches that "are affecting her eyes".  States that it wasn't her eyes that were the problem.  Went to see her doctor and her CP was 210/108.

## 2015-08-23 ENCOUNTER — Emergency Department (HOSPITAL_COMMUNITY): Payer: PPO

## 2015-08-23 ENCOUNTER — Inpatient Hospital Stay (HOSPITAL_COMMUNITY): Payer: PPO

## 2015-08-23 ENCOUNTER — Other Ambulatory Visit (HOSPITAL_COMMUNITY): Payer: PPO

## 2015-08-23 ENCOUNTER — Encounter (HOSPITAL_COMMUNITY): Payer: Self-pay

## 2015-08-23 DIAGNOSIS — K219 Gastro-esophageal reflux disease without esophagitis: Secondary | ICD-10-CM

## 2015-08-23 DIAGNOSIS — I161 Hypertensive emergency: Secondary | ICD-10-CM | POA: Insufficient documentation

## 2015-08-23 DIAGNOSIS — E1122 Type 2 diabetes mellitus with diabetic chronic kidney disease: Secondary | ICD-10-CM

## 2015-08-23 DIAGNOSIS — R930 Abnormal findings on diagnostic imaging of skull and head, not elsewhere classified: Secondary | ICD-10-CM | POA: Diagnosis not present

## 2015-08-23 DIAGNOSIS — I6320 Cerebral infarction due to unspecified occlusion or stenosis of unspecified precerebral arteries: Secondary | ICD-10-CM | POA: Diagnosis not present

## 2015-08-23 DIAGNOSIS — E782 Mixed hyperlipidemia: Secondary | ICD-10-CM

## 2015-08-23 DIAGNOSIS — N182 Chronic kidney disease, stage 2 (mild): Secondary | ICD-10-CM

## 2015-08-23 DIAGNOSIS — I639 Cerebral infarction, unspecified: Secondary | ICD-10-CM | POA: Diagnosis not present

## 2015-08-23 DIAGNOSIS — R51 Headache: Secondary | ICD-10-CM

## 2015-08-23 LAB — CBC WITH DIFFERENTIAL/PLATELET
BASOS PCT: 0 %
Basophils Absolute: 0 10*3/uL (ref 0.0–0.1)
EOS ABS: 0.3 10*3/uL (ref 0.0–0.7)
EOS PCT: 5 %
HCT: 39 % (ref 36.0–46.0)
Hemoglobin: 12.9 g/dL (ref 12.0–15.0)
LYMPHS ABS: 2 10*3/uL (ref 0.7–4.0)
Lymphocytes Relative: 37 %
MCH: 28 pg (ref 26.0–34.0)
MCHC: 33.1 g/dL (ref 30.0–36.0)
MCV: 84.8 fL (ref 78.0–100.0)
Monocytes Absolute: 0.5 10*3/uL (ref 0.1–1.0)
Monocytes Relative: 10 %
NEUTROS PCT: 48 %
Neutro Abs: 2.6 10*3/uL (ref 1.7–7.7)
PLATELETS: 147 10*3/uL — AB (ref 150–400)
RBC: 4.6 MIL/uL (ref 3.87–5.11)
RDW: 14.3 % (ref 11.5–15.5)
WBC: 5.4 10*3/uL (ref 4.0–10.5)

## 2015-08-23 LAB — CSF CELL COUNT WITH DIFFERENTIAL
RBC COUNT CSF: 0 /mm3
RBC COUNT CSF: 30 /mm3 — AB
Tube #: 1
Tube #: 4
WBC CSF: 0 /mm3 (ref 0–5)
WBC CSF: 1 /mm3 (ref 0–5)

## 2015-08-23 LAB — BASIC METABOLIC PANEL
Anion gap: 9 (ref 5–15)
BUN: 12 mg/dL (ref 6–20)
CALCIUM: 9.3 mg/dL (ref 8.9–10.3)
CO2: 25 mmol/L (ref 22–32)
CREATININE: 0.79 mg/dL (ref 0.44–1.00)
Chloride: 103 mmol/L (ref 101–111)
GFR calc non Af Amer: >60 mL/min (ref >60)
Glucose, Bld: 121 mg/dL — ABNORMAL HIGH (ref 65–99)
POTASSIUM: 3.7 mmol/L (ref 3.5–5.1)
SODIUM: 137 mmol/L (ref 135–145)

## 2015-08-23 LAB — GLUCOSE, CAPILLARY: GLUCOSE-CAPILLARY: 171 mg/dL — AB (ref 65–99)

## 2015-08-23 LAB — HEPATIC FUNCTION PANEL
ALT: 34 U/L (ref 14–54)
AST: 38 U/L (ref 15–41)
Albumin: 4 g/dL (ref 3.5–5.0)
Alkaline Phosphatase: 85 U/L (ref 38–126)
BILIRUBIN DIRECT: 0.2 mg/dL (ref 0.1–0.5)
BILIRUBIN INDIRECT: 0.8 mg/dL (ref 0.3–0.9)
BILIRUBIN TOTAL: 1 mg/dL (ref 0.3–1.2)
Total Protein: 6.7 g/dL (ref 6.5–8.1)

## 2015-08-23 LAB — TROPONIN I: Troponin I: <0.03 ng/mL (ref <0.031)

## 2015-08-23 LAB — PROTEIN, CSF: Total  Protein, CSF: 24 mg/dL (ref 15–45)

## 2015-08-23 LAB — GLUCOSE, CSF: Glucose, CSF: 57 mg/dL (ref 40–70)

## 2015-08-23 LAB — CBG MONITORING, ED: Glucose-Capillary: 146 mg/dL — ABNORMAL HIGH (ref 65–99)

## 2015-08-23 MED ORDER — SODIUM CHLORIDE 0.9% FLUSH
3.0000 mL | Freq: Two times a day (BID) | INTRAVENOUS | Status: DC
  Administered 2015-08-23 – 2015-08-24 (×3): 3 mL via INTRAVENOUS

## 2015-08-23 MED ORDER — CLONIDINE HCL 0.1 MG PO TABS
0.2000 mg | ORAL_TABLET | Freq: Two times a day (BID) | ORAL | Status: DC
  Administered 2015-08-23 – 2015-08-25 (×5): 0.2 mg via ORAL
  Filled 2015-08-23 (×6): qty 2

## 2015-08-23 MED ORDER — INSULIN ASPART 100 UNIT/ML ~~LOC~~ SOLN
0.0000 [IU] | Freq: Three times a day (TID) | SUBCUTANEOUS | Status: DC
  Administered 2015-08-23: 2 [IU] via SUBCUTANEOUS
  Administered 2015-08-24 – 2015-08-25 (×2): 1 [IU] via SUBCUTANEOUS

## 2015-08-23 MED ORDER — STROKE: EARLY STAGES OF RECOVERY BOOK
Freq: Once | Status: AC
  Administered 2015-08-23: 14:00:00

## 2015-08-23 MED ORDER — INSULIN ASPART 100 UNIT/ML ~~LOC~~ SOLN: 0.0000 [IU] | Freq: Every day | SUBCUTANEOUS | Status: DC

## 2015-08-23 MED ORDER — ENALAPRIL MALEATE 20 MG PO TABS
20.0000 mg | ORAL_TABLET | Freq: Two times a day (BID) | ORAL | Status: DC
  Administered 2015-08-23 – 2015-08-25 (×5): 20 mg via ORAL
  Filled 2015-08-23 (×9): qty 1

## 2015-08-23 MED ORDER — ATENOLOL 100 MG PO TABS
100.0000 mg | ORAL_TABLET | Freq: Every day | ORAL | Status: DC
  Administered 2015-08-23 – 2015-08-25 (×3): 100 mg via ORAL
  Filled 2015-08-23 (×3): qty 1

## 2015-08-23 MED ORDER — ACETAMINOPHEN 325 MG PO TABS
650.0000 mg | ORAL_TABLET | ORAL | Status: DC | PRN
  Administered 2015-08-23 – 2015-08-25 (×6): 650 mg via ORAL
  Filled 2015-08-23 (×7): qty 2

## 2015-08-23 MED ORDER — SODIUM CHLORIDE 0.9% FLUSH: 3.0000 mL | INTRAVENOUS | Status: DC | PRN

## 2015-08-23 MED ORDER — SENNOSIDES-DOCUSATE SODIUM 8.6-50 MG PO TABS: 1.0000 | ORAL_TABLET | Freq: Every evening | ORAL | Status: DC | PRN

## 2015-08-23 MED ORDER — ACETAMINOPHEN 650 MG RE SUPP: 650.0000 mg | RECTAL | Status: DC | PRN

## 2015-08-23 MED ORDER — ASPIRIN 300 MG RE SUPP: 300.0000 mg | Freq: Every day | RECTAL | Status: DC

## 2015-08-23 MED ORDER — ASPIRIN 325 MG PO TABS
325.0000 mg | ORAL_TABLET | Freq: Every day | ORAL | Status: DC
  Administered 2015-08-23 – 2015-08-25 (×3): 325 mg via ORAL
  Filled 2015-08-23 (×3): qty 1

## 2015-08-23 MED ORDER — OMEGA-3-ACID ETHYL ESTERS 1 G PO CAPS
1.0000 g | ORAL_CAPSULE | Freq: Every day | ORAL | Status: DC
  Administered 2015-08-23 – 2015-08-25 (×3): 1 g via ORAL
  Filled 2015-08-23 (×3): qty 1

## 2015-08-23 MED ORDER — IOHEXOL 350 MG/ML SOLN
100.0000 mL | Freq: Once | INTRAVENOUS | Status: AC | PRN
  Administered 2015-08-23: 100 mL via INTRAVENOUS

## 2015-08-23 MED ORDER — VERAPAMIL HCL 120 MG PO TABS
120.0000 mg | ORAL_TABLET | Freq: Two times a day (BID) | ORAL | Status: DC
  Administered 2015-08-23 – 2015-08-25 (×5): 120 mg via ORAL
  Filled 2015-08-23 (×9): qty 1

## 2015-08-23 MED ORDER — ENOXAPARIN SODIUM 40 MG/0.4ML ~~LOC~~ SOLN
40.0000 mg | Freq: Every day | SUBCUTANEOUS | Status: DC
  Administered 2015-08-23: 40 mg via SUBCUTANEOUS
  Filled 2015-08-23 (×3): qty 0.4

## 2015-08-23 MED ORDER — IBUPROFEN 200 MG PO TABS
400.0000 mg | ORAL_TABLET | Freq: Once | ORAL | Status: AC
  Administered 2015-08-23: 400 mg via ORAL
  Filled 2015-08-23: qty 2

## 2015-08-23 MED ORDER — SODIUM CHLORIDE 0.9 % IV SOLN: 250.0000 mL | INTRAVENOUS | Status: DC | PRN

## 2015-08-23 MED ORDER — DEXTROSE-NACL 5-0.45 % IV SOLN
INTRAVENOUS | Status: AC
  Administered 2015-08-23: 08:00:00 via INTRAVENOUS

## 2015-08-23 NOTE — ED Notes (Signed)
Patient returned from MRI. [facility] called. Family at bedside.

## 2015-08-23 NOTE — ED Notes (Signed)
RN Leone Haven will draw labs

## 2015-08-23 NOTE — Plan of Care (Signed)
70 year old female with history of hypertension and diabetes presents with complaints of headache blurred vision and unsteady gait. Patient had CT head followed by CT angiogram of the head and neck which shows abnormality in the pons concerning for acute ischemia. The neurologist on-call was consulted by the ER physician. Had also in addition has requested lumbar bulge which was done labs are pending. Patient will be transferred to [facility] for further stroke workup. Accepting physician will be Dr. Alcario Drought.  Michele [name].

## 2015-08-23 NOTE — H&P (Addendum)
History and Physical:    Michele [name]   YCX:[id number] DOB: Oct 19, 1945 DOA: 08/22/2015  Referring MD/provider: Varney Biles, MD PCP: Alesia Richards, MD   Chief Complaint: High blood pressure  History of Present Illness:   Michele [name] is an 70 y.o. female with PMHx of HTN, HLD, DM and FMHx of brain aneurysm who presented to the Emergency Department complaining of gradual onset, constant hypertension, ongoing for a few weeks. Pt checks her pressures regularly at home with a highest reading of 190/97. She had a physical in [place] in which her PCP increased her dose of enalapril. Pt reports severe, generalized headache three days ago and difficulty with her vision two days ago. The patient says she was having "flashes" of light, associated with severe headache.  She saw an ophthalmologist who told her that the problem wasn't due to her eyes.   She then went to her PCP's office 08/22/15 and was told to come to the ED for further evaluation.  Pt states associated occasional lightheadedness/unsteadiness, and fatigue ongoing for approximately six months. Pt's headache is worse when she bends over to pick something up off the floor. No other worsening or alleviating factors noted. No hx of strokes, numbness, tingling, or focal neurological deficits. Pt has BP of 190/96 while roomed in ED. Pt states that her headache is 4/10 in the ED. with CT of the head showed 2 focal 9 mm hypodensities, one in the right caudate and the second in the right pons suspicious for possible early/acute versus subacute ischemia.  ROS:   Review of Systems  Constitutional: Positive for malaise/fatigue. Negative for fever, chills and weight loss.  HENT: Negative for hearing loss.        Right sided ear congestion  Eyes: Negative for blurred vision, double vision, photophobia, pain, discharge and redness.       Visual changes.  Respiratory: Negative for cough and shortness of breath.   Cardiovascular:  Negative for chest pain and palpitations.  Gastrointestinal: Positive for heartburn. Negative for nausea, vomiting, abdominal pain, diarrhea, blood in stool and melena.  Genitourinary: Negative.   Musculoskeletal: Negative.   Neurological: Positive for weakness and headaches. Negative for dizziness, tingling, tremors, sensory change, speech change, focal weakness, seizures and loss of consciousness.       Feels unsteady on feet.  Endo/Heme/Allergies: Bruises/bleeds easily.  Psychiatric/Behavioral: Negative for depression. The patient has insomnia. The patient is not nervous/anxious.      Past Medical History:   Past Medical History  Diagnosis Date  . Hypertension   . Carpal tunnel syndrome   . Tubulovillous adenoma of colon   . GERD (gastroesophageal reflux disease)   . Hyperlipidemia   . 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 Fatty liver   . Esophageal motility disorder   . Duodenal diverticulum   . Anxiety   . Arthritis   . Type II or unspecified type diabetes mellitus without mention of complication, not stated as uncontrolled     Past Surgical History:   Past Surgical History  Procedure Laterality Date  . Appendectomy    . Tonsillectomy    . Carpal tunnel release    . Cyst removal hand      Social History:   Social History   Social History  . Marital Status: Married    Spouse Name: Trilby Drummer  . Number of Children: 1  . Years of Education: N/A   Occupational History  . [employer]    Social History  Main Topics  . Smoking status: Never Smoker   . Smokeless tobacco: Never Used  . Alcohol Use: No  . Drug Use: No  . Sexual Activity: Not on file   Other Topics Concern  . Not on file   Social History Narrative   Lives with husband.  Independent of ADLs. Retired but stays active with [employer] part time.    Family history:   Family History  Problem Relation Age of Onset  . Heart disease Mother   . Heart disease Father   .  Heart disease Brother     x2  . Diabetes Mother   . Diabetes Father   . Colon polyps Mother     Allergies   Lipitor  Current Medications:   Prior to Admission medications   Medication Sig Start Date End Date Taking? Authorizing Provider  ACCU-CHEK SMARTVIEW test strip USE TO CHECK BLOOD GLUCOSE EVERY DAY 04/25/15  Yes [name], MD  atenolol (TENORMIN) 100 MG tablet Take 1 tablet (100 mg total) by mouth daily. 07/14/15  Yes [name], MD  Biotin 1000 MCG tablet Take 1,000 mcg by mouth daily. Reported on 08/22/2015   Yes Historical Provider, MD  Blood Glucose Monitoring Suppl (ACCU-CHEK NANO SMARTVIEW) W/DEVICE KIT CHECK BLOOD GLUCOSE THREE TIMES DAILY BEFORE MEALS 10/28/13  Yes Vicie Mutters, PA-C  Cholecalciferol (VITAMIN D PO) Take 5,000 Units by mouth daily.    Yes Historical Provider, MD  cloNIDine (CATAPRES) 0.2 MG tablet TAKE 1 TABLET TWO TIMES DAILY, UNLESS PRESSURE IS MORE THAN 160 THEN 3 TIMES DAILY AS DIRECTED Patient taking differently: Take 0.2 mg by mouth 2 (two) times daily. TAKE 1 TABLET TWO TIMES DAILY, UNLESS PRESSURE IS MORE THAN 160 THEN 3 TIMES DAILY AS DIRECTED 07/14/15  Yes [name], MD  enalapril (VASOTEC) 20 MG tablet TAKE 1 TABLET TWICE DAILY Patient taking differently: TAKE 20 MG BY MOUTH TWICE DAILY 03/21/14  Yes [name], MD  Flaxseed, Linseed, (FLAXSEED OIL PO) Take 1,000 mg by mouth daily. Reported on 08/22/2015   Yes Historical Provider, MD  furosemide (LASIX) 40 MG tablet TAKE 1 TABLET EVERY DAY Patient taking differently: TAKE 40 MG BY MOUTH EVERY DAY 03/17/14  Yes [name], MD  Iron TABS Take 1 tablet by mouth 2 (two) times daily.    Yes Historical Provider, MD  metFORMIN (GLUCOPHAGE-XR) 500 MG 24 hr tablet TAKE 1 TABLET THREE TIMES DAILY Patient taking differently: TAKE 500 MG BY MOUTH THREE TIMES DAILY 09/12/14 09/12/15 Yes [name], MD  Omega-3 Fatty Acids (FISH OIL) 1000 MG CAPS Take 1,000 mg by mouth. Reported on  08/22/2015   Yes Historical Provider, MD  Red Yeast Rice 600 MG CAPS Take 600 mg by mouth 2 (two) times daily. Reported on 08/22/2015   Yes Historical Provider, MD  verapamil (CALAN) 120 MG tablet TAKE 1 TABLET TWICE DAILY Patient taking differently: TAKE 120 MG BY MOUTH  TWICE DAILY 03/17/15  Yes [name], MD  citalopram (CELEXA) 20 MG tablet TAKE 1 TABLET EVERY DAY Patient not taking: Reported on 08/22/2015 05/03/15   [name], MD    Physical Exam:   Filed Vitals:   08/23/15 0553 08/23/15 0700 08/23/15 0705 08/23/15 0730  BP: 178/100 187/77  202/81  Pulse: 49  51 57  Temp:      TempSrc:      Resp: 18  18   SpO2: 93%  95% 96%     Physical Exam: Blood pressure 202/81, pulse 57, temperature  98.2 F (36.8 C), temperature source Oral, resp. rate 18, SpO2 96 %. Gen: No acute distress. Head: Normocephalic, atraumatic. Eyes: PERRL, EOMI, sclerae nonicteric. Mouth: OropharynxIs clear. Tongue midline. Dry mucous membranes.  Neck: Supple, no thyromegaly, no lymphadenopathy, no jugular venous distention. Chest: LungsClear to auscultation bilaterally with good air movement.  CV: Heart soundsAre regular. No murmurs, rubs, or gallops.  Abdomen: Soft, nontender, nondistended with normal active bowel sounds. Extremities: ExtremitiesWithout clubbing, edema, or cyanosis.  Skin: Warm and dry. Neuro: Alert and oriented times 3; cranial nerves II-12 grossly intact, moves all extremities with equal strength, no pronator drift. Psych: Mood and affect normal.   Data Review:    Labs: Basic Metabolic Panel:  Recent Labs Lab 08/23/15 0211  NA 137  K 3.7  CL 103  CO2 25  GLUCOSE 121*  BUN 12  CREATININE 0.79  CALCIUM 9.3   Liver Function Tests: No results for input(s): AST, ALT, ALKPHOS, BILITOT, PROT, ALBUMIN in the last 168 hours. No results for input(s): LIPASE, AMYLASE in the last 168 hours. No results for input(s): AMMONIA in the last 168 hours. CBC:  Recent  Labs Lab 08/23/15 0211  WBC 5.4  NEUTROABS 2.6  HGB 12.9  HCT 39.0  MCV 84.8  PLT 147*   Cardiac Enzymes:  Recent Labs Lab 08/23/15 0211  TROPONINI <0.03    BNP (last 3 results) No results for input(s): PROBNP in the last 8760 hours. CBG: No results for input(s): GLUCAP in the last 168 hours.  Radiographic Studies: Ct Angio Head W/cm &/or Wo Cm  08/23/2015  CLINICAL DATA:  Initial valuation for acute headache, visual disturbance. EXAM: CT ANGIOGRAPHY HEAD AND NECK TECHNIQUE: Multidetector CT imaging of the head and neck was performed using the standard protocol during bolus administration of intravenous contrast. Multiplanar CT image reconstructions and MIPs were obtained to evaluate the vascular anatomy. Carotid stenosis measurements (when applicable) are obtained utilizing NASCET criteria, using the distal internal carotid diameter as the denominator. CONTRAST:  156m OMNIPAQUE IOHEXOL 350 MG/ML SOLN COMPARISON:  Prior noncontrast head CT from earlier same day. FINDINGS: CTA NECK Aortic arch: Visualized aortic arch is of normal caliber with normal branch pattern. No high-grade stenosis at the origin of the great vessels. Visualized subclavian arteries are well opacified bilaterally. Right carotid system: Right common carotid artery patent from its origin to the bifurcation. Right ICA widely patent from the bifurcation to the skullbase. No dissection, stenosis, or vascular occlusion within the right carotid artery system. Right external carotid artery is branches grossly normal. Left carotid system: Left common carotid artery patent from its origin to the bifurcation. Minimal plaque about the proximal left ICA without high-grade stenosis. Left ICA patent distally to the skullbase without dissection, stenosis, or occlusion. Left external carotid artery and its branches grossly normal. Vertebral arteries:Both vertebral arteries arise from the subclavian arteries. Vertebral arteries well  opacified along their entire course without evidence for dissection, stenosis, or occlusion. Skeleton: No acute osseous abnormality. No worrisome lytic or blastic osseous lesions. Other neck: Scattered subsegmental atelectasis present on the visualized lungs. Visualized lungs are otherwise clear. Visualized superior mediastinum within normal limits. Right thyroid lobe somewhat enlarged and heterogeneous in appearance with internal calcification. No adenopathy. No acute soft tissue abnormality. CTA HEAD Anterior circulation: Petrous segments widely patent bilaterally. Scattered atheromatous plaque within the cavernous ICAs with mild diffuse narrowing. No flow limiting stenosis. Supraclinoid segments widely patent. A1 segments patent. Right A1 segment is hypoplastic. Anterior communicating artery normal. Anterior cerebral arteries  well opacified to their distal aspects. M1 segments patent without stenosis or occlusion. MCA bifurcations normal. Distal MCA branches well opacified to their distal aspects. Posterior circulation: Vertebral arteries patent to the vertebrobasilar junction. Posterior inferior cerebral arteries patent bilaterally. Basilar artery widely patent. Superior cerebral arteries patent bilaterally. Fetal type right PCA supplied via a prominent right posterior communicating artery. Left PCA arises from the basilar artery and is well opacified to its distal aspect. Left posterior communicating artery also noted. Venous sinuses: Patent without evidence for venous sinus thrombosis. Anatomic variants: Fetal type right PCA. No aneurysm. No vascular malformation. Delayed phase: No abnormal enhancement on delayed sequence. Previously noted hypodensities involving the right caudate and pons again noted. IMPRESSION: CTA NECK IMPRESSION: 1. Mild atheromatous plaque about the proximal left ICA without flow-limiting stenosis. 2. Otherwise normal CTA of the neck. No flow limiting or critical stenosis again  identified. CTA HEAD IMPRESSION: 1. No large or proximal arterial branch occlusion within the intracranial circulation. No high-grade or correctable stenosis. 2. Scattered atheromatous plaque within the cavernous ICAs bilaterally with secondary mild diffuse narrowing. 3. No intracranial aneurysm identified. 4. Note again made of 2 focal hypodensities, 1 involving the right caudate/internal capsule and the other involving the dorsal pons. Again, these are concerning for possible evolving ischemia. This could be further assessed with dedicated MRI. Electronically Signed   By: Jeannine Boga M.D.   On: 08/23/2015 04:30   Ct Head Wo Contrast  08/23/2015  CLINICAL DATA:  Initial valuation for acute headache, left eye visual disturbance. EXAM: CT HEAD WITHOUT CONTRAST TECHNIQUE: Contiguous axial images were obtained from the base of the skull through the vertex without intravenous contrast. COMPARISON:  Prior study from 06/03/2012. FINDINGS: Generalized age-related cerebral atrophy present. Patchy hypodensity within the periventricular and deep white matter most consistent with chronic small vessel ischemic disease. There is a new focal hypodensity within the right caudate head, indeterminate, but may reflect sequela of acute/ subacute ischemia. In additional vague hypodensity within the right dorsal pons also suspicious for possible acute ischemia (series 2, image 11). No other definite evolving large vessel territory infarct. No acute intracranial hemorrhage. No mass lesion or midline shift. No hydrocephalus. No extra-axial fluid collection. Scalp soft tissues within normal limits. No acute abnormality about the orbits. Left sphenoid sinus disease noted. Minimal mucosal thickening within the right max O sinus. Scattered opacity within the posterior right ethmoidal air cells. Mastoid air cells are clear. Calvarium intact. IMPRESSION: Two focal 9 mm hypodensities, 1 involving the right caudate, with the second  involving the right pons. While these are somewhat age indeterminate, findings are suspicious for possible acute/ early subacute ischemia. Further evaluation with MRI is suggested. Electronically Signed   By: Jeannine Boga M.D.   On: 08/23/2015 02:12   Ct Angio Neck W/cm &/or Wo/cm  08/23/2015  CLINICAL DATA:  Initial valuation for acute headache, visual disturbance. EXAM: CT ANGIOGRAPHY HEAD AND NECK TECHNIQUE: Multidetector CT imaging of the head and neck was performed using the standard protocol during bolus administration of intravenous contrast. Multiplanar CT image reconstructions and MIPs were obtained to evaluate the vascular anatomy. Carotid stenosis measurements (when applicable) are obtained utilizing NASCET criteria, using the distal internal carotid diameter as the denominator. CONTRAST:  1104m OMNIPAQUE IOHEXOL 350 MG/ML SOLN COMPARISON:  Prior noncontrast head CT from earlier same day. FINDINGS: CTA NECK Aortic arch: Visualized aortic arch is of normal caliber with normal branch pattern. No high-grade stenosis at the origin of the great vessels. Visualized  subclavian arteries are well opacified bilaterally. Right carotid system: Right common carotid artery patent from its origin to the bifurcation. Right ICA widely patent from the bifurcation to the skullbase. No dissection, stenosis, or vascular occlusion within the right carotid artery system. Right external carotid artery is branches grossly normal. Left carotid system: Left common carotid artery patent from its origin to the bifurcation. Minimal plaque about the proximal left ICA without high-grade stenosis. Left ICA patent distally to the skullbase without dissection, stenosis, or occlusion. Left external carotid artery and its branches grossly normal. Vertebral arteries:Both vertebral arteries arise from the subclavian arteries. Vertebral arteries well opacified along their entire course without evidence for dissection, stenosis, or  occlusion. Skeleton: No acute osseous abnormality. No worrisome lytic or blastic osseous lesions. Other neck: Scattered subsegmental atelectasis present on the visualized lungs. Visualized lungs are otherwise clear. Visualized superior mediastinum within normal limits. Right thyroid lobe somewhat enlarged and heterogeneous in appearance with internal calcification. No adenopathy. No acute soft tissue abnormality. CTA HEAD Anterior circulation: Petrous segments widely patent bilaterally. Scattered atheromatous plaque within the cavernous ICAs with mild diffuse narrowing. No flow limiting stenosis. Supraclinoid segments widely patent. A1 segments patent. Right A1 segment is hypoplastic. Anterior communicating artery normal. Anterior cerebral arteries well opacified to their distal aspects. M1 segments patent without stenosis or occlusion. MCA bifurcations normal. Distal MCA branches well opacified to their distal aspects. Posterior circulation: Vertebral arteries patent to the vertebrobasilar junction. Posterior inferior cerebral arteries patent bilaterally. Basilar artery widely patent. Superior cerebral arteries patent bilaterally. Fetal type right PCA supplied via a prominent right posterior communicating artery. Left PCA arises from the basilar artery and is well opacified to its distal aspect. Left posterior communicating artery also noted. Venous sinuses: Patent without evidence for venous sinus thrombosis. Anatomic variants: Fetal type right PCA. No aneurysm. No vascular malformation. Delayed phase: No abnormal enhancement on delayed sequence. Previously noted hypodensities involving the right caudate and pons again noted. IMPRESSION: CTA NECK IMPRESSION: 1. Mild atheromatous plaque about the proximal left ICA without flow-limiting stenosis. 2. Otherwise normal CTA of the neck. No flow limiting or critical stenosis again identified. CTA HEAD IMPRESSION: 1. No large or proximal arterial branch occlusion within  the intracranial circulation. No high-grade or correctable stenosis. 2. Scattered atheromatous plaque within the cavernous ICAs bilaterally with secondary mild diffuse narrowing. 3. No intracranial aneurysm identified. 4. Note again made of 2 focal hypodensities, 1 involving the right caudate/internal capsule and the other involving the dorsal pons. Again, these are concerning for possible evolving ischemia. This could be further assessed with dedicated MRI. Electronically Signed   By: Jeannine Boga M.D.   On: 08/23/2015 04:30   *I have personally reviewed the images above*  EKG: Not ordered, will order. Normal sinus rhythm on telemetry monitoring.   Assessment/Plan:   Principal Problem: Stroke [facility])  Admit to telemetry.  Stroke service consulted.  Not a candidate for TPA: No focal deficits on neuro exam, unclear as to when stroke occurred.  CT head shows 2 focal hypodensities within the right caudate head and pons.  MRI brain ordered.  Aspirin daily.  Check FLP, hemoglobin A1c.  Check 2 D Echocardiogram. CT angiogram of the neck negative for flow-limiting or critical stenosis.  RN to perform stroke swallowing screen.  Neuro checks Q 2 hours x 12 hours, then Q 4 hours.  PT/OT evaluations.    Active problems:  Hypertension/hypertensive urgency  Antihypertensive treatment is recommended for both prevention of recurrent stroke and prevention  of other vascular events in persons who have had an ischemic stroke or TIA and are beyond the hyperacute period (Class I, Level of Evidence A). Because this benefit extends to persons with and without a history of hypertension, this recommendation should be considered for all ischemic stroke and TIA patients (Class IIa, Level of Evidence B). An absolute target BP level and reduction are uncertain and should be individualized, but benefit has been associated with an average reduction of ?10/5 mm Hg, and normal BP levels have been defined  as <120/80 mm Hg by JNC-7 (Class IIa, Level of Evidence B).    Given possibility of acute ischemic stroke, will avoid dropping blood pressure in the first 24 hours. Continue usual home antihypertensives for now.  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 Although all major classes of antihypertensives are suitable for BP control, most patients will require >1 agent. ACEIs and ARBs are more effective in reducing the progression of renal disease and are recommended as first-choice medications for patients with DM (Class I, Level of Evidence A).   Glucose control is recommended to near-normoglycemic levels among diabetics with ischemic stroke or TIA to reduce microvascular complications (Class I, Level of Evidence A) and possibly macrovascular complications (Class IIb, Level of Evidence B). The goal for hemoglobin A1c should be ?7% (Class IIa, Level of Evidence B).   We'll hold metformin for now and use SSRI, insulin sensitive scale.  Hyperlipidemia  Patients with ischemic stroke or TIA with elevated cholesterol, comorbid coronary artery disease, or evidence of an atherosclerotic origin should be managed according to NCEP III guidelines, which include lifestyle modification, dietary guidelines, and medication recommendations (Class I, Level of Evidence A)  Patients with ischemic stroke or TIA presumed to be due to an atherosclerotic origin but with no preexisting indications for statins (normal cholesterol levels, no comorbid coronary artery disease, or no evidence of atherosclerosis) are reasonable candidates for treatment with a statin agent to reduce the risk of vascular events (Class IIa, Level of Evidence B). Unfortunately, the patient has an allergy to Lipitor.  Statin agents are recommended, and the target goal for cholesterol lowering for those with CHD or  symptomatic atherosclerotic disease is an LDL-C of <100 mg/dL and LDL-C of <70 mg/dL for very-high-risk persons with multiple risk factors (Class I, Level of Evidence A).   Patients with ischemic stroke or TIA with low HDL cholesterol may be considered for treatment with niacin or gemfibrozil (Class IIb, Level of Evidence B).  We'll discuss treatment options once we get the results of her FLP.    GERD  Treat symptomatically as needed.  DVT prophylaxis   Lovenox ordered.  Code Status / Family Communication / Disposition Plan:   Code Status: Full. Family Communication: Husband Trilby Drummer is emergency contact. He has already gone home after being with the patient through the night. Disposition Plan: Home when stable, likely 08/25/15 if hemodynamically stable.  Attestation regarding necessity of inpatient status:   The appropriate admission status for this patient is INPATIENT. Inpatient status is judged to be reasonable and necessary in order to provide the required intensity of service to ensure the patient's safety. The patient's presenting symptoms, physical exam findings, and initial radiographic and laboratory data in the context of their chronic comorbidities is felt to place them at high risk for further clinical deterioration. Furthermore, it is not anticipated  that the patient will be medically stable for discharge from the hospital within 2 midnights of admission. The following factors support the admission status of inpatient.   -The patient's presenting symptoms include headache and visual changes. - The worrisome physical exam findings include dry mucous membranes. - The initial radiographic and laboratory data are worrisome because of evidence of acute versus subacute CVA. - The chronic co-morbidities include hypertension, hyperlipidemia and diabetes. - Patient requires inpatient status due to high intensity of service, high risk for further deterioration and high frequency of  surveillance required. - I certify that at the point of admission it is my clinical judgment that the patient will require inpatient hospital care spanning beyond 2 midnights from the point of admission.   Time spent: One hour.  RAMA,CHRISTINA [facility] Pager [phone] Cell: [phone]   If 7PM-7AM, please contact night-coverage [email or web address] Password [facility] 08/23/2015, 8:12 AM

## 2015-08-23 NOTE — ED Notes (Signed)
Patient had LP at 0540 08/23/15. Was instructed by MD to lay flat for 24 hours.

## 2015-08-23 NOTE — ED Provider Notes (Addendum)
CSN: [id number]     Arrival date & time 08/22/15  1723 History   By signing my name below, I, Christian Pulliam, attest that this documentation has been prepared under the direction and in the presence of Jared M Gardner, DO.   Electronically Signed: Christian Pulliam, ED Scribe. 08/23/2015. 7:12 AM  Chief Complaint  Patient presents with  . Hypertension    The history is provided by the patient. No language interpreter was used.   HPI Comments: Shontae B Hemp is a 70 y.o. female with PMHx of HTN, HLD, DM and FMHx of brain aneurysm who presents to the Emergency Department complaining of gradual onset, constant hypertension, ongoing for a few weeks. Pt checks her pressures regularly at home with a highest reading of 190/97. She had a physical in Februay in which her PCP increased her dose of enalapril. Pt reports severe, generalized headache three days ago and difficulty with her vision two days ago. Pt states associated occasional lightheadedness/unsteadiness, and fatigue ongoing for approximately six months. Pt's headache is worse when she bends over to pick something up off the floor. No other worsening or alleviating factors noted. Pt denies chest pain, back pain, abdominal pain, shortness of breath, hx of strokes, numbness, tingling, or any other pertinent. Pt has BP of 190/96 while roomed in ED. Pt states that her headache is 4/10 in the ED.   Past Medical History  Diagnosis Date  . Hypertension   . Carpal tunnel syndrome   . Tubulovillous adenoma of colon   . GERD (gastroesophageal reflux disease)   . Hyperlipidemia   . 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 Fatty liver   . Esophageal motility disorder   . Duodenal diverticulum   . Anxiety   . Arthritis   . Type II or unspecified type diabetes mellitus without mention of complication, not stated as uncontrolled    Past Surgical History  Procedure Laterality Date  . Appendectomy    . Tonsillectomy    . Carpal tunnel release    . Cyst  removal hand     Family History  Problem Relation Age of Onset  . Heart disease Mother   . Heart disease Father   . Heart disease Brother     x2  . Diabetes Mother   . Diabetes Father   . Colon polyps Mother    Social History  Substance Use Topics  . Smoking status: Never Smoker   . Smokeless tobacco: Never Used  . Alcohol Use: No   OB History    No data available     Review of Systems  A complete 10 system review of systems was obtained and all systems are negative except as noted in the HPI and PMH.   Allergies  Lipitor  Home Medications   Prior to Admission medications   Medication Sig Start Date End Date Taking? Authorizing Provider  ACCU-CHEK SMARTVIEW test strip USE TO CHECK BLOOD GLUCOSE EVERY DAY 04/25/15  Yes William McKeown, MD  atenolol (TENORMIN) 100 MG tablet Take 1 tablet (100 mg total) by mouth daily. 07/14/15  Yes William McKeown, MD  Biotin 1000 MCG tablet Take 1,000 mcg by mouth daily. Reported on 08/22/2015   Yes Historical Provider, MD  Blood Glucose Monitoring Suppl (ACCU-CHEK NANO SMARTVIEW) W/DEVICE KIT CHECK BLOOD GLUCOSE THREE TIMES DAILY BEFORE MEALS 10/28/13  Yes Amanda Collier, PA-C  Cholecalciferol (VITAMIN D PO) Take 5,000 Units by mouth daily.    Yes Historical Provider, MD  cloNIDine (CATAPRES) 0.2 MG tablet TAKE   1 TABLET TWO TIMES DAILY, UNLESS PRESSURE IS MORE THAN 160 THEN 3 TIMES DAILY AS DIRECTED Patient taking differently: Take 0.2 mg by mouth 2 (two) times daily. TAKE 1 TABLET TWO TIMES DAILY, UNLESS PRESSURE IS MORE THAN 160 THEN 3 TIMES DAILY AS DIRECTED 07/14/15  Yes [name], MD  enalapril (VASOTEC) 20 MG tablet TAKE 1 TABLET TWICE DAILY Patient taking differently: TAKE 20 MG BY MOUTH TWICE DAILY 03/21/14  Yes [name], MD  Flaxseed, Linseed, (FLAXSEED OIL PO) Take 1,000 mg by mouth daily. Reported on 08/22/2015   Yes Historical Provider, MD  furosemide (LASIX) 40 MG tablet TAKE 1 TABLET EVERY DAY Patient taking  differently: TAKE 40 MG BY MOUTH EVERY DAY 03/17/14  Yes [name], MD  Iron TABS Take 1 tablet by mouth 2 (two) times daily.    Yes Historical Provider, MD  metFORMIN (GLUCOPHAGE-XR) 500 MG 24 hr tablet TAKE 1 TABLET THREE TIMES DAILY Patient taking differently: TAKE 500 MG BY MOUTH THREE TIMES DAILY 09/12/14 09/12/15 Yes [name], MD  Omega-3 Fatty Acids (FISH OIL) 1000 MG CAPS Take 1,000 mg by mouth. Reported on 08/22/2015   Yes Historical Provider, MD  Red Yeast Rice 600 MG CAPS Take 600 mg by mouth 2 (two) times daily. Reported on 08/22/2015   Yes Historical Provider, MD  verapamil (CALAN) 120 MG tablet TAKE 1 TABLET TWICE DAILY Patient taking differently: TAKE 120 MG BY MOUTH  TWICE DAILY 03/17/15  Yes [name], MD  citalopram (CELEXA) 20 MG tablet TAKE 1 TABLET EVERY DAY Patient not taking: Reported on 08/22/2015 05/03/15   [name], MD   BP 212/75 mmHg  Pulse 52  Temp(Src) 98.2 F (36.8 C) (Oral)  Resp 18  SpO2 100% Physical Exam  Constitutional: She is oriented to person, place, and time. She appears well-developed and well-nourished. No distress.  HENT:  Head: Normocephalic and atraumatic.  Eyes: EOM are normal.  Neck: Normal range of motion. No JVD present.  Cardiovascular: Normal rate, regular rhythm and normal heart sounds.   Pulmonary/Chest: Effort normal and breath sounds normal.  Abdominal: Soft. She exhibits no distension. There is no tenderness.  Musculoskeletal: Normal range of motion.  Neurological: She is alert and oriented to person, place, and time. She has normal strength. No cranial nerve deficit or sensory deficit.  Cerebellar exam reveals dysmetria. UE strength is 4+/5 bilaterally.  LE strength is 4+/5 bilaterally.  Sensory exam for UE and LE is equal and normal.  On ambulation - pt had slight dizziness but walked without significant assistance  Skin: Skin is warm and dry.  Psychiatric: She has a normal mood and affect. Judgment  normal.  Nursing note and vitals reviewed.   ED Course  .Lumbar Puncture Date/Time: 08/23/2015 7:12 AM Performed by: Varney Biles Authorized by: Varney Biles Consent: Written consent obtained. Risks and benefits: risks, benefits and alternatives were discussed Consent given by: patient Patient understanding: patient states understanding of the procedure being performed Required items: required blood products, implants, devices, and special equipment available Patient identity confirmed: verbally with patient Time out: Immediately prior to procedure a "time out" was called to verify the correct patient, procedure, equipment, support staff and site/side marked as required. Indications: evaluation for subarachnoid hemorrhage Anesthesia: local infiltration Local anesthetic: lidocaine 1% with epinephrine Anesthetic total: 5 ml Patient sedated: no Preparation: Patient was prepped and draped in the usual sterile fashion. Lumbar space: L4-L5 interspace Patient's position: sitting Needle gauge: 18 Number of attempts: 4 Fluid appearance: clear  Tubes of fluid: 4 Total volume: 10 ml Post-procedure: site cleaned Patient tolerance: Patient tolerated the procedure well with no immediate complications   (including critical care time) DIAGNOSTIC STUDIES: Oxygen Saturation is 100% on RA, normal by my interpretation.    COORDINATION OF CARE: 1:36 AM-Discussed treatment plan which includes CT head without contrast, CBC with differential/platelette, and troponin with pt at bedside and pt agreed to plan.   Labs Review Labs Reviewed  CBC WITH DIFFERENTIAL/PLATELET - Abnormal; Notable for the following:    Platelets 147 (*)    All other components within normal limits  BASIC METABOLIC PANEL - Abnormal; Notable for the following:    Glucose, Bld 121 (*)    All other components within normal limits  CSF CELL COUNT WITH DIFFERENTIAL - Abnormal; Notable for the following:    RBC Count, CSF  30 (*)    All other components within normal limits  CSF CULTURE  TROPONIN I  CSF CELL COUNT WITH DIFFERENTIAL  GLUCOSE, CSF  PROTEIN, CSF    Imaging Review Ct Head Wo Contrast  08/23/2015  CLINICAL DATA:  Initial valuation for acute headache, left eye visual disturbance. EXAM: CT HEAD WITHOUT CONTRAST TECHNIQUE: Contiguous axial images were obtained from the base of the skull through the vertex without intravenous contrast. COMPARISON:  Prior study from 06/03/2012. FINDINGS: Generalized age-related cerebral atrophy present. Patchy hypodensity within the periventricular and deep white matter most consistent with chronic small vessel ischemic disease. There is a new focal hypodensity within the right caudate head, indeterminate, but may reflect sequela of acute/ subacute ischemia. In additional vague hypodensity within the right dorsal pons also suspicious for possible acute ischemia (series 2, image 11). No other definite evolving large vessel territory infarct. No acute intracranial hemorrhage. No mass lesion or midline shift. No hydrocephalus. No extra-axial fluid collection. Scalp soft tissues within normal limits. No acute abnormality about the orbits. Left sphenoid sinus disease noted. Minimal mucosal thickening within the right max O sinus. Scattered opacity within the posterior right ethmoidal air cells. Mastoid air cells are clear. Calvarium intact. IMPRESSION: Two focal 9 mm hypodensities, 1 involving the right caudate, with the second involving the right pons. While these are somewhat age indeterminate, findings are suspicious for possible acute/ early subacute ischemia. Further evaluation with MRI is suggested. Electronically Signed   By: Jeannine Boga M.D.   On: 08/23/2015 02:12   I have personally reviewed and evaluated these images and lab results as part of my medical decision-making.   EKG Interpretation None       MDM   Final diagnoses:  Hypertensive emergency   Headache  Acute ischemic stroke [facility])    I personally performed the services described in this documentation, which was scribed in my presence. The recorded information has been reviewed and is accurate.   Pt comes in with cc of headaches and elevated blood pressure. Pt has family hx of brain AN, she comes in with significantly elevated BP also. On exam - pt has some dizziness, and off and on visual disturbance. Headache is not typical of temporal arteritis, and pain not reproduced with palpation. Main concerns are for PRES, ICH, HTN emergency, SAH from a leaking aneurysm.  We will start with CT head. She will get basic labs. We will likely need to consider Ct-A and/or LP for evaluation of SAH.   LATE ENTRY: CT scan shows subacute stroke. Spoke with Dr. Nicole Kindred, Neurology. Discussed the concerns for possible SAH due to elevated BP, headache and  family hx - and we decided the best route would be to get CT-A and LP and then transfer the patient - as the treatments are different. CT-A shows no aneurysms and the LP was completed and is also normal. Medicine to admit. Permissive HTN for now. Pt to be transferred to [facility].   , MD 08/23/15 0718   , MD 08/23/15 0719 

## 2015-08-23 NOTE — ED Notes (Signed)
Patient transported to CT 

## 2015-08-23 NOTE — ED Notes (Signed)
No white cells, no organisms, report given to Dr. Kathrynn Humble

## 2015-08-23 NOTE — ED Notes (Signed)
Report called to Claiborne Billings, Therapist, sports at [facility] 46M

## 2015-08-23 NOTE — ED Notes (Signed)
Patient transported to MRI 

## 2015-08-23 NOTE — Progress Notes (Signed)
Patient arrived to 5M15. Patient is alert, oriented x4, appropriate cognition, states she has "2/10" pain in lower back at LP site. VSS, see flowsheet. Skin is intact, tele set up. Patient oriented to unit, staff, call bell, phone. Will continue to monitor. Bed alarm on.

## 2015-08-24 ENCOUNTER — Inpatient Hospital Stay (HOSPITAL_BASED_OUTPATIENT_CLINIC_OR_DEPARTMENT_OTHER): Payer: PPO

## 2015-08-24 DIAGNOSIS — I639 Cerebral infarction, unspecified: Secondary | ICD-10-CM | POA: Diagnosis not present

## 2015-08-24 DIAGNOSIS — E0800 Diabetes mellitus due to underlying condition with hyperosmolarity without nonketotic hyperglycemic-hyperosmolar coma (NKHHC): Secondary | ICD-10-CM

## 2015-08-24 DIAGNOSIS — I10 Essential (primary) hypertension: Secondary | ICD-10-CM | POA: Diagnosis not present

## 2015-08-24 DIAGNOSIS — I6300 Cerebral infarction due to thrombosis of unspecified precerebral artery: Secondary | ICD-10-CM | POA: Diagnosis not present

## 2015-08-24 DIAGNOSIS — G44001 Cluster headache syndrome, unspecified, intractable: Secondary | ICD-10-CM | POA: Diagnosis not present

## 2015-08-24 DIAGNOSIS — I6789 Other cerebrovascular disease: Secondary | ICD-10-CM | POA: Diagnosis not present

## 2015-08-24 LAB — GLUCOSE, CAPILLARY
GLUCOSE-CAPILLARY: 124 mg/dL — AB (ref 65–99)
GLUCOSE-CAPILLARY: 115 mg/dL — AB (ref 65–99)
GLUCOSE-CAPILLARY: 117 mg/dL — AB (ref 65–99)
GLUCOSE-CAPILLARY: 95 mg/dL (ref 65–99)
Glucose-Capillary: 140 mg/dL — ABNORMAL HIGH (ref 65–99)

## 2015-08-24 LAB — LIPID PANEL
CHOL/HDL RATIO: 6.2 ratio
Cholesterol: 203 mg/dL — ABNORMAL HIGH (ref 0–200)
HDL: 33 mg/dL — ABNORMAL LOW (ref >40)
LDL CALC: 125 mg/dL — AB (ref 0–99)
Triglycerides: 225 mg/dL — ABNORMAL HIGH (ref <150)
VLDL: 45 mg/dL — ABNORMAL HIGH (ref 0–40)

## 2015-08-24 LAB — TSH: TSH: 0.061 u[IU]/mL — ABNORMAL LOW (ref 0.350–4.500)

## 2015-08-24 LAB — ECHOCARDIOGRAM COMPLETE
Height: 63 in
Weight: 2356.28 oz

## 2015-08-24 MED ORDER — ROSUVASTATIN CALCIUM 20 MG PO TABS
20.0000 mg | ORAL_TABLET | Freq: Every day | ORAL | Status: DC
  Administered 2015-08-24: 20 mg via ORAL
  Filled 2015-08-24 (×2): qty 1

## 2015-08-24 MED ORDER — DOCUSATE SODIUM 100 MG PO CAPS
100.0000 mg | ORAL_CAPSULE | Freq: Two times a day (BID) | ORAL | Status: DC
  Administered 2015-08-24 – 2015-08-25 (×3): 100 mg via ORAL
  Filled 2015-08-24 (×3): qty 1

## 2015-08-24 NOTE — Progress Notes (Signed)
Patient has been on bedrest during this shift until this am at 0530 . Alert and oriented x 4 VS stable no  blurred/double vision this shift , no change in neuro status . C/o lower  back pain to LP site rated 6/10 and tylenol given .RN will continue to monitor pt.Michele [name]

## 2015-08-24 NOTE — Progress Notes (Signed)
Occupational Therapy Evaluation Patient Details Name: Michele [name] MRN: [id number] DOB: Oct 06, 1945 Today's Date: 08/24/2015    History of Present Illness Pt 70 y.o. female with PMHx of HTN, HLD, DM and FMHx of brain aneurysm who presented to the ED complaining of gradual onset, constant HTN, ongoing for a few weeks. Pt alsohad been experiencing headaches and vision disturbances as well as occasional lightheadedness/dizziness. CT of the head showed 2 focal 9 mm hypodensities, one in the right caudate and the second in the right pons suspicious for possible early/acute versus subacute ischemia.   Clinical Impression   Pt admitted with the above diagnoses. PTA pt was independent with ADLs. Pt presents at/close to baseline with ADLs on evaluation. Pt completed household distance functional mobility with good toleration and no LOB or swaying noted. Stroke education reviewed. No further OT needs indicated at this time. OT signing off.     Follow Up Recommendations  No OT follow up    Equipment Recommendations  None recommended by OT    Recommendations for Other Services       Precautions / Restrictions Restrictions Weight Bearing Restrictions: No      Mobility Bed Mobility Overal bed mobility: Modified Independent             General bed mobility comments: used bed rails, educated on log roll technique  Transfers Overall transfer level: Modified independent Equipment used: None             General transfer comment: slightly extra time    Balance Overall balance assessment: No apparent balance deficits (not formally assessed)                                          ADL Overall ADL's : At baseline                                       General ADL Comments: Pt completed household distance functional mobility including turns with no LOB or swaying noted. Discussed stroke education. Pt reports she feels she is at baseline with  ADLs. Tolerated session well. Discussed placing seating options strategically around farm so she has a place to sit if she needs to rest while going about her day.     Vision Vision Assessment?: No apparent visual deficits   Perception     Praxis      Pertinent Vitals/Pain Pain Assessment: Faces Faces Pain Scale: Hurts a little bit Pain Location: site of LP Pain Descriptors / Indicators: Sore Pain Intervention(s): Monitored during session     Hand Dominance     Extremity/Trunk Assessment Upper Extremity Assessment Upper Extremity Assessment: Overall WFL for tasks assessed   Lower Extremity Assessment Lower Extremity Assessment: Defer to PT evaluation       Communication Communication Communication: No difficulties   Cognition Arousal/Alertness: Awake/alert Behavior During Therapy: WFL for tasks assessed/performed Overall Cognitive Status: Within Functional Limits for tasks assessed                     General Comments       Exercises       Shoulder Instructions      Home Living Family/patient expects to be discharged to:: Private residence Living Arrangements: Spouse/significant other Available Help at Discharge: Family Type of Home: House  Home Access: Stairs to enter Entrance Stairs-Number of Steps: 3 Entrance Stairs-Rails: Left Home Layout: One level     Bathroom Shower/Tub: Walk-in shower;Tub/shower unit         Home Equipment: None   Additional Comments[place]. 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      Prior Functioning/Environment Level of Independence: Independent             OT Diagnosis: Generalized weakness   OT Problem List:     OT Treatment/Interventions:      OT Goals(Current goals can be found in the care plan section)    OT Frequency:     Barriers to D/C:            Co-evaluation              End of Session Nurse Communication: Mobility status  Activity Tolerance: Patient tolerated treatment  well Patient left: in bed;with call bell/phone within reach   Time: 1040-1057 OT Time Calculation (min): 17 min Charges:  OT General Charges $OT Visit: 1 Procedure OT Evaluation $OT Eval Low Complexity: 1 Procedure G-Codes:    Hortencia Pilar September 15, 2015, 11:13 AM

## 2015-08-24 NOTE — Progress Notes (Addendum)
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 female with PMHx of HTN, HLD, DM and FMHx of brain aneurysm who presented to the Emergency Department complaining of gradual onset, constant hypertension, ongoing for a few weeks. Pt checks her pressures regularly at home with a highest reading of 190/97. She had a physical in [place] in which her PCP increased her dose of enalapril. Pt reports severe, generalized headache three days ago and difficulty with her vision two days ago. The patient says she was having "flashes" of light, associated with severe headache. She saw an ophthalmologist who told her that the problem wasn't due to her eyes. She then went to her PCP's office 08/22/15 and was told to come to the ED for further evaluation. Pt states associated occasional lightheadedness/unsteadiness, and fatigue ongoing for approximately six months. Pt's headache is worse when she bends over to pick something up off the floor. No other worsening or alleviating factors noted. No hx of strokes, numbness, tingling, or focal neurological deficits. Pt has BP of 190/96 while roomed in ED. Pt states that her headache is 4/10 in the ED. with CT of the head showed 2 focal 9 mm hypodensities, one in the right caudate and the second in the right pons suspicious for possible early/acute versus subacute ischemia.  Assessment and plan Headache likely secondary to Hypertensive urgency  Admit to telemetry.no events   Not a candidate for TPA: No focal deficits on neuro exam, unclear as to when stroke occurred.  CT head shows 2 focal hypodensities within the right caudate head and  pons.No acute infarct.  Lipid panel shows triglycerides 225, LDL 125, hemoglobin A1c.  Check 2 D Echocardiogram. CT angiogram of the neck negative for flow-limiting or critical stenosis.  RN to perform stroke swallowing screen.  Neuro checks Q 2 hours x 12 hours, then Q 4 hours.  PT/OT evaluations.-No follow-up needed     Hypertension/hypertensive urgency  Continue lisinopril, atenolol, verapamil,Clonidine,prn hydralazine  Secondary hypertension workup including Renin aldosterone ratio, Fractionated catecholamines, TSH,  Previously ruled out for renal artery stenosis  Diabetes with stage II chronic kidney disease  Hemoglobin A1c pending  We'll hold metformin for now and use SSRI, insulin sensitive scale.  Hyperlipidemia   Unfortunately, the patient has an allergy to Lipitor.  Statin agents are recommended, and the target goal for cholesterol lowering for those with CHD or symptomatic atherosclerotic disease is an LDL-C of <100 mg/dL and LDL-C of <70 mg/dL for very-high-risk persons with multiple risk factors (Class I, Level of Evidence A).   Patients with ischemic stroke or TIA with low HDL cholesterol may be considered for treatment with niacin or gemfibrozil (Class IIb, Level of Evidence B).  Start patient on Crestor and see if she tolerates   GERD  Treat symptomatically as needed.    DVT prophylaxsis Lovenox  Code Status:      Code Status Orders        Start     Ordered   08/23/15 0830  Full code   Continuous     08/23/15 0830     Family Communication: Discussed in detail with the  patient, all imaging results, lab results explained to the patient   Disposition Plan:  Anticipate discharge tomorrow      Consultants:  None  Procedures:  none  Antibiotics: Anti-infectives    None         HPI/Subjective: Patient denies any headache or blurry vision, no chest pain or shortness of breath  Objective: Filed Vitals:   08/23/15 2308  08/24/15 0100 08/24/15 0531 08/24/15 1000  BP: 190/68 161/67 149/64 150/71  Pulse: 50 50 49 60  Temp: 98.1 F (36.7 C) 98.1 F (36.7 C) 97.9 F (36.6 C) 97.3 F (36.3 C)  TempSrc: Oral Oral Oral Oral  Resp:  18 16 16   Height:      Weight:      SpO2:  95% 96% 96%    Intake/Output Summary (Last 24 hours) at 08/24/15 1143 Last data filed at 08/24/15 0830  Gross per 24 hour  Intake    600 ml  Output    250 ml  Net    350 ml    Exam:  General: No acute respiratory distress Lungs: Clear to auscultation bilaterally without wheezes or crackles Cardiovascular: Regular rate and rhythm without murmur gallop or rub normal S1 and S2 Abdomen: Nontender, nondistended, soft, bowel sounds positive, no rebound, no ascites, no appreciable mass Extremities: No significant cyanosis, clubbing, or edema bilateral lower extremities     Data Review   Micro Results Recent Results (from the past 240 hour(s))  CSF culture     Status: None (Preliminary result)   Collection Time: 08/23/15  5:41 AM  Result Value Ref Range Status   Specimen Description CSF BACK  Final   Special Requests Normal  Final   Gram Stain   Final    CYTOSPIN NO WBC SEEN NO ORGANISMS SEEN Gram Stain Report Called to,Read Back By and Verified With: DENNIS RN AT 0620 ON 03.22.17 BY SHUEA    Culture   Final    NO GROWTH 1 DAY Performed at [facility]    Report Status PENDING  Incomplete    Radiology Reports Ct Angio Head W/cm &/or Wo Cm  08/23/2015  CLINICAL DATA:  Initial valuation for acute headache, visual disturbance. EXAM: CT ANGIOGRAPHY HEAD AND NECK TECHNIQUE: Multidetector CT imaging of the head and neck was performed using the standard protocol during bolus administration of intravenous contrast. Multiplanar CT image reconstructions and MIPs were obtained to evaluate the vascular anatomy. Carotid stenosis measurements (when applicable) are obtained utilizing NASCET criteria, using the distal internal  carotid diameter as the denominator. CONTRAST:  113mL OMNIPAQUE IOHEXOL 350 MG/ML SOLN COMPARISON:  Prior noncontrast head CT from earlier same day. FINDINGS: CTA NECK Aortic arch: Visualized aortic arch is of normal caliber with normal branch pattern. No high-grade stenosis at the origin of the great vessels. Visualized subclavian arteries are well opacified bilaterally. Right carotid system: Right common carotid artery patent from its origin to the bifurcation. Right ICA widely patent from the bifurcation to the skullbase. No dissection, stenosis, or vascular occlusion within the right carotid artery system. Right external carotid artery is branches grossly normal. Left carotid system: Left common carotid artery patent from its origin to the bifurcation. Minimal plaque about the proximal left ICA without high-grade stenosis. Left ICA patent distally to the skullbase without dissection, stenosis, or occlusion. Left external carotid artery and its branches grossly normal. Vertebral arteries:Both vertebral arteries arise from the subclavian arteries. Vertebral arteries well opacified along their entire course without evidence for dissection,  stenosis, or occlusion. Skeleton: No acute osseous abnormality. No worrisome lytic or blastic osseous lesions. Other neck: Scattered subsegmental atelectasis present on the visualized lungs. Visualized lungs are otherwise clear. Visualized superior mediastinum within normal limits. Right thyroid lobe somewhat enlarged and heterogeneous in appearance with internal calcification. No adenopathy. No acute soft tissue abnormality. CTA HEAD Anterior circulation: Petrous segments widely patent bilaterally. Scattered atheromatous plaque within the cavernous ICAs with mild diffuse narrowing. No flow limiting stenosis. Supraclinoid segments widely patent. A1 segments patent. Right A1 segment is hypoplastic. Anterior communicating artery normal. Anterior cerebral arteries well opacified to  their distal aspects. M1 segments patent without stenosis or occlusion. MCA bifurcations normal. Distal MCA branches well opacified to their distal aspects. Posterior circulation: Vertebral arteries patent to the vertebrobasilar junction. Posterior inferior cerebral arteries patent bilaterally. Basilar artery widely patent. Superior cerebral arteries patent bilaterally. Fetal type right PCA supplied via a prominent right posterior communicating artery. Left PCA arises from the basilar artery and is well opacified to its distal aspect. Left posterior communicating artery also noted. Venous sinuses: Patent without evidence for venous sinus thrombosis. Anatomic variants: Fetal type right PCA. No aneurysm. No vascular malformation. Delayed phase: No abnormal enhancement on delayed sequence. Previously noted hypodensities involving the right caudate and pons again noted. IMPRESSION: CTA NECK IMPRESSION: 1. Mild atheromatous plaque about the proximal left ICA without flow-limiting stenosis. 2. Otherwise normal CTA of the neck. No flow limiting or critical stenosis again identified. CTA HEAD IMPRESSION: 1. No large or proximal arterial branch occlusion within the intracranial circulation. No high-grade or correctable stenosis. 2. Scattered atheromatous plaque within the cavernous ICAs bilaterally with secondary mild diffuse narrowing. 3. No intracranial aneurysm identified. 4. Note again made of 2 focal hypodensities, 1 involving the right caudate/internal capsule and the other involving the dorsal pons. Again, these are concerning for possible evolving ischemia. This could be further assessed with dedicated MRI. Electronically Signed   By: Jeannine Boga M.D.   On: 08/23/2015 04:30   Ct Head Wo Contrast  08/23/2015  CLINICAL DATA:  Initial valuation for acute headache, left eye visual disturbance. EXAM: CT HEAD WITHOUT CONTRAST TECHNIQUE: Contiguous axial images were obtained from the base of the skull through  the vertex without intravenous contrast. COMPARISON:  Prior study from 06/03/2012. FINDINGS: Generalized age-related cerebral atrophy present. Patchy hypodensity within the periventricular and deep white matter most consistent with chronic small vessel ischemic disease. There is a new focal hypodensity within the right caudate head, indeterminate, but may reflect sequela of acute/ subacute ischemia. In additional vague hypodensity within the right dorsal pons also suspicious for possible acute ischemia (series 2, image 11). No other definite evolving large vessel territory infarct. No acute intracranial hemorrhage. No mass lesion or midline shift. No hydrocephalus. No extra-axial fluid collection. Scalp soft tissues within normal limits. No acute abnormality about the orbits. Left sphenoid sinus disease noted. Minimal mucosal thickening within the right max O sinus. Scattered opacity within the posterior right ethmoidal air cells. Mastoid air cells are clear. Calvarium intact. IMPRESSION: Two focal 9 mm hypodensities, 1 involving the right caudate, with the second involving the right pons. While these are somewhat age indeterminate, findings are suspicious for possible acute/ early subacute ischemia. Further evaluation with MRI is suggested. Electronically Signed   By: Jeannine Boga M.D.   On: 08/23/2015 02:12   Ct Angio Neck W/cm &/or Wo/cm  08/23/2015  CLINICAL DATA:  Initial valuation for acute headache, visual disturbance. EXAM: CT ANGIOGRAPHY HEAD AND NECK  TECHNIQUE: Multidetector CT imaging of the head and neck was performed using the standard protocol during bolus administration of intravenous contrast. Multiplanar CT image reconstructions and MIPs were obtained to evaluate the vascular anatomy. Carotid stenosis measurements (when applicable) are obtained utilizing NASCET criteria, using the distal internal carotid diameter as the denominator. CONTRAST:  18mL OMNIPAQUE IOHEXOL 350 MG/ML SOLN  COMPARISON:  Prior noncontrast head CT from earlier same day. FINDINGS: CTA NECK Aortic arch: Visualized aortic arch is of normal caliber with normal branch pattern. No high-grade stenosis at the origin of the great vessels. Visualized subclavian arteries are well opacified bilaterally. Right carotid system: Right common carotid artery patent from its origin to the bifurcation. Right ICA widely patent from the bifurcation to the skullbase. No dissection, stenosis, or vascular occlusion within the right carotid artery system. Right external carotid artery is branches grossly normal. Left carotid system: Left common carotid artery patent from its origin to the bifurcation. Minimal plaque about the proximal left ICA without high-grade stenosis. Left ICA patent distally to the skullbase without dissection, stenosis, or occlusion. Left external carotid artery and its branches grossly normal. Vertebral arteries:Both vertebral arteries arise from the subclavian arteries. Vertebral arteries well opacified along their entire course without evidence for dissection, stenosis, or occlusion. Skeleton: No acute osseous abnormality. No worrisome lytic or blastic osseous lesions. Other neck: Scattered subsegmental atelectasis present on the visualized lungs. Visualized lungs are otherwise clear. Visualized superior mediastinum within normal limits. Right thyroid lobe somewhat enlarged and heterogeneous in appearance with internal calcification. No adenopathy. No acute soft tissue abnormality. CTA HEAD Anterior circulation: Petrous segments widely patent bilaterally. Scattered atheromatous plaque within the cavernous ICAs with mild diffuse narrowing. No flow limiting stenosis. Supraclinoid segments widely patent. A1 segments patent. Right A1 segment is hypoplastic. Anterior communicating artery normal. Anterior cerebral arteries well opacified to their distal aspects. M1 segments patent without stenosis or occlusion. MCA  bifurcations normal. Distal MCA branches well opacified to their distal aspects. Posterior circulation: Vertebral arteries patent to the vertebrobasilar junction. Posterior inferior cerebral arteries patent bilaterally. Basilar artery widely patent. Superior cerebral arteries patent bilaterally. Fetal type right PCA supplied via a prominent right posterior communicating artery. Left PCA arises from the basilar artery and is well opacified to its distal aspect. Left posterior communicating artery also noted. Venous sinuses: Patent without evidence for venous sinus thrombosis. Anatomic variants: Fetal type right PCA. No aneurysm. No vascular malformation. Delayed phase: No abnormal enhancement on delayed sequence. Previously noted hypodensities involving the right caudate and pons again noted. IMPRESSION: CTA NECK IMPRESSION: 1. Mild atheromatous plaque about the proximal left ICA without flow-limiting stenosis. 2. Otherwise normal CTA of the neck. No flow limiting or critical stenosis again identified. CTA HEAD IMPRESSION: 1. No large or proximal arterial branch occlusion within the intracranial circulation. No high-grade or correctable stenosis. 2. Scattered atheromatous plaque within the cavernous ICAs bilaterally with secondary mild diffuse narrowing. 3. No intracranial aneurysm identified. 4. Note again made of 2 focal hypodensities, 1 involving the right caudate/internal capsule and the other involving the dorsal pons. Again, these are concerning for possible evolving ischemia. This could be further assessed with dedicated MRI. Electronically Signed   By: Jeannine Boga M.D.   On: 08/23/2015 04:30   Mr Jodene Nam Head Wo Contrast  08/23/2015  CLINICAL DATA:  70 year old female with severe headache for 3 days with visual changes. Initial encounter. EXAM: MRI HEAD WITHOUT CONTRAST MRA HEAD WITHOUT CONTRAST TECHNIQUE: Multiplanar, multiecho pulse sequences of the brain and  surrounding structures were obtained  without intravenous contrast. Angiographic images of the head were obtained using MRA technique without contrast. COMPARISON:  None. CTA head and neck 0324 hours today. Noncontrast head CT 0021 hours today and earlier. FINDINGS: MRI HEAD FINDINGS No restricted diffusion to suggest acute infarction. No midline shift, mass effect, evidence of mass lesion, ventriculomegaly, extra-axial collection or acute intracranial hemorrhage. Cervicomedullary junction and pituitary are within normal limits. Major intracranial vascular flow voids are preserved. Patchy T2 and FLAIR hyperintensity in the right pons, and both cerebellar peduncle is a. Similar patchy T2 hyperintensity in the right caudate nucleus. Otherwise scattered small mild to moderate for age nonspecific cerebral white matter T2 and FLAIR hyperintensity, mostly subcortical. No chronic cerebral blood products. No cortical encephalomalacia. Negative visualized cervical spine. Visible internal auditory structures appear normal. Trace inferior mastoid fluid on the left. Negative nasopharynx. Mild to moderate scattered paranasal sinus mucosal thickening. Bilateral orbits soft tissues appear normal. Negative scalp soft tissues. Visualized bone marrow signal is within normal limits MRA HEAD FINDINGS Antegrade flow in the posterior circulation with codominant distal vertebral arteries. No distal vertebral artery stenosis. Normal left PICA origin. Dominant appearing right AICA origin is normal. The basilar artery is diminutive but without stenosis. Normal SCA origins. Fetal type bilateral PCA origins, more so the right. Bilateral PCA branches are within normal limits. Antegrade flow in both ICA siphons. Mild siphon irregularity with no siphon stenosis. Ophthalmic and posterior communicating artery origins are normal. Patent carotid termini. Dominant left ACA A1 segment, the right A1 is diminutive. Anterior communicating artery and visualized bilateral ACA branches are  within normal limits. Bilateral MCA M1 segments and MCA bifurcations are within normal limits. Visualized bilateral MCA branches are within normal limits IMPRESSION: 1. No acute infarct. 2. Nonspecific but favor chronic small vessel disease related signal abnormality in the right pons, right caudate, bilateral cerebellar peduncles and cerebral white matter. Demyelinating disease is the main differential consideration. 3.  Negative intracranial MRA. Electronically Signed   By: Genevie Ann M.D.   On: 08/23/2015 12:00   Mr Brain Wo Contrast  08/23/2015  CLINICAL DATA:  70 year old female with severe headache for 3 days with visual changes. Initial encounter. EXAM: MRI HEAD WITHOUT CONTRAST MRA HEAD WITHOUT CONTRAST TECHNIQUE: Multiplanar, multiecho pulse sequences of the brain and surrounding structures were obtained without intravenous contrast. Angiographic images of the head were obtained using MRA technique without contrast. COMPARISON:  None. CTA head and neck 0324 hours today. Noncontrast head CT 0021 hours today and earlier. FINDINGS: MRI HEAD FINDINGS No restricted diffusion to suggest acute infarction. No midline shift, mass effect, evidence of mass lesion, ventriculomegaly, extra-axial collection or acute intracranial hemorrhage. Cervicomedullary junction and pituitary are within normal limits. Major intracranial vascular flow voids are preserved. Patchy T2 and FLAIR hyperintensity in the right pons, and both cerebellar peduncle is a. Similar patchy T2 hyperintensity in the right caudate nucleus. Otherwise scattered small mild to moderate for age nonspecific cerebral white matter T2 and FLAIR hyperintensity, mostly subcortical. No chronic cerebral blood products. No cortical encephalomalacia. Negative visualized cervical spine. Visible internal auditory structures appear normal. Trace inferior mastoid fluid on the left. Negative nasopharynx. Mild to moderate scattered paranasal sinus mucosal thickening.  Bilateral orbits soft tissues appear normal. Negative scalp soft tissues. Visualized bone marrow signal is within normal limits MRA HEAD FINDINGS Antegrade flow in the posterior circulation with codominant distal vertebral arteries. No distal vertebral artery stenosis. Normal left PICA origin. Dominant appearing right AICA origin is  normal. The basilar artery is diminutive but without stenosis. Normal SCA origins. Fetal type bilateral PCA origins, more so the right. Bilateral PCA branches are within normal limits. Antegrade flow in both ICA siphons. Mild siphon irregularity with no siphon stenosis. Ophthalmic and posterior communicating artery origins are normal. Patent carotid termini. Dominant left ACA A1 segment, the right A1 is diminutive. Anterior communicating artery and visualized bilateral ACA branches are within normal limits. Bilateral MCA M1 segments and MCA bifurcations are within normal limits. Visualized bilateral MCA branches are within normal limits IMPRESSION: 1. No acute infarct. 2. Nonspecific but favor chronic small vessel disease related signal abnormality in the right pons, right caudate, bilateral cerebellar peduncles and cerebral white matter. Demyelinating disease is the main differential consideration. 3.  Negative intracranial MRA. Electronically Signed   By: Genevie Ann M.D.   On: 08/23/2015 12:00     CBC  Recent Labs Lab 08/23/15 0211  WBC 5.4  HGB 12.9  HCT 39.0  PLT 147*  MCV 84.8  MCH 28.0  MCHC 33.1  RDW 14.3  LYMPHSABS 2.0  MONOABS 0.5  EOSABS 0.3  BASOSABS 0.0    Chemistries   Recent Labs Lab 08/23/15 0211  NA 137  K 3.7  CL 103  CO2 25  GLUCOSE 121*  BUN 12  CREATININE 0.79  CALCIUM 9.3  AST 38  ALT 34  ALKPHOS 85  BILITOT 1.0   ------------------------------------------------------------------------------------------------------------------ estimated creatinine clearance is 61 mL/min (by C-G formula based on Cr of  0.79). ------------------------------------------------------------------------------------------------------------------ No results for input(s): HGBA1C in the last 72 hours. ------------------------------------------------------------------------------------------------------------------  Recent Labs  08/24/15 0627  CHOL 203*  HDL 33*  LDLCALC 125*  TRIG 225*  CHOLHDL 6.2   ------------------------------------------------------------------------------------------------------------------ No results for input(s): TSH, T4TOTAL, T3FREE, THYROIDAB in the last 72 hours.  Invalid input(s): FREET3 ------------------------------------------------------------------------------------------------------------------ No results for input(s): VITAMINB12, FOLATE, FERRITIN, TIBC, IRON, RETICCTPCT in the last 72 hours.  Coagulation profile No results for input(s): INR, PROTIME in the last 168 hours.  No results for input(s): DDIMER in the last 72 hours.  Cardiac Enzymes  Recent Labs Lab 08/23/15 0211  TROPONINI <0.03   ------------------------------------------------------------------------------------------------------------------ Invalid input(s): POCBNP   CBG:  Recent Labs Lab 08/23/15 0952 08/23/15 1638 08/23/15 2135  GLUCAP 146* 171* 124*       Studies: Ct Angio Head W/cm &/or Wo Cm  08/23/2015  CLINICAL DATA:  Initial valuation for acute headache, visual disturbance. EXAM: CT ANGIOGRAPHY HEAD AND NECK TECHNIQUE: Multidetector CT imaging of the head and neck was performed using the standard protocol during bolus administration of intravenous contrast. Multiplanar CT image reconstructions and MIPs were obtained to evaluate the vascular anatomy. Carotid stenosis measurements (when applicable) are obtained utilizing NASCET criteria, using the distal internal carotid diameter as the denominator. CONTRAST:  154mL OMNIPAQUE IOHEXOL 350 MG/ML SOLN COMPARISON:  Prior noncontrast head  CT from earlier same day. FINDINGS: CTA NECK Aortic arch: Visualized aortic arch is of normal caliber with normal branch pattern. No high-grade stenosis at the origin of the great vessels. Visualized subclavian arteries are well opacified bilaterally. Right carotid system: Right common carotid artery patent from its origin to the bifurcation. Right ICA widely patent from the bifurcation to the skullbase. No dissection, stenosis, or vascular occlusion within the right carotid artery system. Right external carotid artery is branches grossly normal. Left carotid system: Left common carotid artery patent from its origin to the bifurcation. Minimal plaque about the proximal left ICA without high-grade stenosis. Left ICA patent distally  to the skullbase without dissection, stenosis, or occlusion. Left external carotid artery and its branches grossly normal. Vertebral arteries:Both vertebral arteries arise from the subclavian arteries. Vertebral arteries well opacified along their entire course without evidence for dissection, stenosis, or occlusion. Skeleton: No acute osseous abnormality. No worrisome lytic or blastic osseous lesions. Other neck: Scattered subsegmental atelectasis present on the visualized lungs. Visualized lungs are otherwise clear. Visualized superior mediastinum within normal limits. Right thyroid lobe somewhat enlarged and heterogeneous in appearance with internal calcification. No adenopathy. No acute soft tissue abnormality. CTA HEAD Anterior circulation: Petrous segments widely patent bilaterally. Scattered atheromatous plaque within the cavernous ICAs with mild diffuse narrowing. No flow limiting stenosis. Supraclinoid segments widely patent. A1 segments patent. Right A1 segment is hypoplastic. Anterior communicating artery normal. Anterior cerebral arteries well opacified to their distal aspects. M1 segments patent without stenosis or occlusion. MCA bifurcations normal. Distal MCA branches well  opacified to their distal aspects. Posterior circulation: Vertebral arteries patent to the vertebrobasilar junction. Posterior inferior cerebral arteries patent bilaterally. Basilar artery widely patent. Superior cerebral arteries patent bilaterally. Fetal type right PCA supplied via a prominent right posterior communicating artery. Left PCA arises from the basilar artery and is well opacified to its distal aspect. Left posterior communicating artery also noted. Venous sinuses: Patent without evidence for venous sinus thrombosis. Anatomic variants: Fetal type right PCA. No aneurysm. No vascular malformation. Delayed phase: No abnormal enhancement on delayed sequence. Previously noted hypodensities involving the right caudate and pons again noted. IMPRESSION: CTA NECK IMPRESSION: 1. Mild atheromatous plaque about the proximal left ICA without flow-limiting stenosis. 2. Otherwise normal CTA of the neck. No flow limiting or critical stenosis again identified. CTA HEAD IMPRESSION: 1. No large or proximal arterial branch occlusion within the intracranial circulation. No high-grade or correctable stenosis. 2. Scattered atheromatous plaque within the cavernous ICAs bilaterally with secondary mild diffuse narrowing. 3. No intracranial aneurysm identified. 4. Note again made of 2 focal hypodensities, 1 involving the right caudate/internal capsule and the other involving the dorsal pons. Again, these are concerning for possible evolving ischemia. This could be further assessed with dedicated MRI. Electronically Signed   By: Jeannine Boga M.D.   On: 08/23/2015 04:30   Ct Head Wo Contrast  08/23/2015  CLINICAL DATA:  Initial valuation for acute headache, left eye visual disturbance. EXAM: CT HEAD WITHOUT CONTRAST TECHNIQUE: Contiguous axial images were obtained from the base of the skull through the vertex without intravenous contrast. COMPARISON:  Prior study from 06/03/2012. FINDINGS: Generalized age-related  cerebral atrophy present. Patchy hypodensity within the periventricular and deep white matter most consistent with chronic small vessel ischemic disease. There is a new focal hypodensity within the right caudate head, indeterminate, but may reflect sequela of acute/ subacute ischemia. In additional vague hypodensity within the right dorsal pons also suspicious for possible acute ischemia (series 2, image 11). No other definite evolving large vessel territory infarct. No acute intracranial hemorrhage. No mass lesion or midline shift. No hydrocephalus. No extra-axial fluid collection. Scalp soft tissues within normal limits. No acute abnormality about the orbits. Left sphenoid sinus disease noted. Minimal mucosal thickening within the right max O sinus. Scattered opacity within the posterior right ethmoidal air cells. Mastoid air cells are clear. Calvarium intact. IMPRESSION: Two focal 9 mm hypodensities, 1 involving the right caudate, with the second involving the right pons. While these are somewhat age indeterminate, findings are suspicious for possible acute/ early subacute ischemia. Further evaluation with MRI is suggested. Electronically Signed  By: Jeannine Boga M.D.   On: 08/23/2015 02:12   Ct Angio Neck W/cm &/or Wo/cm  08/23/2015  CLINICAL DATA:  Initial valuation for acute headache, visual disturbance. EXAM: CT ANGIOGRAPHY HEAD AND NECK TECHNIQUE: Multidetector CT imaging of the head and neck was performed using the standard protocol during bolus administration of intravenous contrast. Multiplanar CT image reconstructions and MIPs were obtained to evaluate the vascular anatomy. Carotid stenosis measurements (when applicable) are obtained utilizing NASCET criteria, using the distal internal carotid diameter as the denominator. CONTRAST:  137mL OMNIPAQUE IOHEXOL 350 MG/ML SOLN COMPARISON:  Prior noncontrast head CT from earlier same day. FINDINGS: CTA NECK Aortic arch: Visualized aortic arch is of  normal caliber with normal branch pattern. No high-grade stenosis at the origin of the great vessels. Visualized subclavian arteries are well opacified bilaterally. Right carotid system: Right common carotid artery patent from its origin to the bifurcation. Right ICA widely patent from the bifurcation to the skullbase. No dissection, stenosis, or vascular occlusion within the right carotid artery system. Right external carotid artery is branches grossly normal. Left carotid system: Left common carotid artery patent from its origin to the bifurcation. Minimal plaque about the proximal left ICA without high-grade stenosis. Left ICA patent distally to the skullbase without dissection, stenosis, or occlusion. Left external carotid artery and its branches grossly normal. Vertebral arteries:Both vertebral arteries arise from the subclavian arteries. Vertebral arteries well opacified along their entire course without evidence for dissection, stenosis, or occlusion. Skeleton: No acute osseous abnormality. No worrisome lytic or blastic osseous lesions. Other neck: Scattered subsegmental atelectasis present on the visualized lungs. Visualized lungs are otherwise clear. Visualized superior mediastinum within normal limits. Right thyroid lobe somewhat enlarged and heterogeneous in appearance with internal calcification. No adenopathy. No acute soft tissue abnormality. CTA HEAD Anterior circulation: Petrous segments widely patent bilaterally. Scattered atheromatous plaque within the cavernous ICAs with mild diffuse narrowing. No flow limiting stenosis. Supraclinoid segments widely patent. A1 segments patent. Right A1 segment is hypoplastic. Anterior communicating artery normal. Anterior cerebral arteries well opacified to their distal aspects. M1 segments patent without stenosis or occlusion. MCA bifurcations normal. Distal MCA branches well opacified to their distal aspects. Posterior circulation: Vertebral arteries patent to  the vertebrobasilar junction. Posterior inferior cerebral arteries patent bilaterally. Basilar artery widely patent. Superior cerebral arteries patent bilaterally. Fetal type right PCA supplied via a prominent right posterior communicating artery. Left PCA arises from the basilar artery and is well opacified to its distal aspect. Left posterior communicating artery also noted. Venous sinuses: Patent without evidence for venous sinus thrombosis. Anatomic variants: Fetal type right PCA. No aneurysm. No vascular malformation. Delayed phase: No abnormal enhancement on delayed sequence. Previously noted hypodensities involving the right caudate and pons again noted. IMPRESSION: CTA NECK IMPRESSION: 1. Mild atheromatous plaque about the proximal left ICA without flow-limiting stenosis. 2. Otherwise normal CTA of the neck. No flow limiting or critical stenosis again identified. CTA HEAD IMPRESSION: 1. No large or proximal arterial branch occlusion within the intracranial circulation. No high-grade or correctable stenosis. 2. Scattered atheromatous plaque within the cavernous ICAs bilaterally with secondary mild diffuse narrowing. 3. No intracranial aneurysm identified. 4. Note again made of 2 focal hypodensities, 1 involving the right caudate/internal capsule and the other involving the dorsal pons. Again, these are concerning for possible evolving ischemia. This could be further assessed with dedicated MRI. Electronically Signed   By: Jeannine Boga M.D.   On: 08/23/2015 04:30   Mr Jodene Nam Head Wo Contrast  08/23/2015  CLINICAL DATA:  70 year old female with severe headache for 3 days with visual changes. Initial encounter. EXAM: MRI HEAD WITHOUT CONTRAST MRA HEAD WITHOUT CONTRAST TECHNIQUE: Multiplanar, multiecho pulse sequences of the brain and surrounding structures were obtained without intravenous contrast. Angiographic images of the head were obtained using MRA technique without contrast. COMPARISON:  None. CTA  head and neck 0324 hours today. Noncontrast head CT 0021 hours today and earlier. FINDINGS: MRI HEAD FINDINGS No restricted diffusion to suggest acute infarction. No midline shift, mass effect, evidence of mass lesion, ventriculomegaly, extra-axial collection or acute intracranial hemorrhage. Cervicomedullary junction and pituitary are within normal limits. Major intracranial vascular flow voids are preserved. Patchy T2 and FLAIR hyperintensity in the right pons, and both cerebellar peduncle is a. Similar patchy T2 hyperintensity in the right caudate nucleus. Otherwise scattered small mild to moderate for age nonspecific cerebral white matter T2 and FLAIR hyperintensity, mostly subcortical. No chronic cerebral blood products. No cortical encephalomalacia. Negative visualized cervical spine. Visible internal auditory structures appear normal. Trace inferior mastoid fluid on the left. Negative nasopharynx. Mild to moderate scattered paranasal sinus mucosal thickening. Bilateral orbits soft tissues appear normal. Negative scalp soft tissues. Visualized bone marrow signal is within normal limits MRA HEAD FINDINGS Antegrade flow in the posterior circulation with codominant distal vertebral arteries. No distal vertebral artery stenosis. Normal left PICA origin. Dominant appearing right AICA origin is normal. The basilar artery is diminutive but without stenosis. Normal SCA origins. Fetal type bilateral PCA origins, more so the right. Bilateral PCA branches are within normal limits. Antegrade flow in both ICA siphons. Mild siphon irregularity with no siphon stenosis. Ophthalmic and posterior communicating artery origins are normal. Patent carotid termini. Dominant left ACA A1 segment, the right A1 is diminutive. Anterior communicating artery and visualized bilateral ACA branches are within normal limits. Bilateral MCA M1 segments and MCA bifurcations are within normal limits. Visualized bilateral MCA branches are within  normal limits IMPRESSION: 1. No acute infarct. 2. Nonspecific but favor chronic small vessel disease related signal abnormality in the right pons, right caudate, bilateral cerebellar peduncles and cerebral white matter. Demyelinating disease is the main differential consideration. 3.  Negative intracranial MRA. Electronically Signed   By: Genevie Ann M.D.   On: 08/23/2015 12:00   Mr Brain Wo Contrast  08/23/2015  CLINICAL DATA:  70 year old female with severe headache for 3 days with visual changes. Initial encounter. EXAM: MRI HEAD WITHOUT CONTRAST MRA HEAD WITHOUT CONTRAST TECHNIQUE: Multiplanar, multiecho pulse sequences of the brain and surrounding structures were obtained without intravenous contrast. Angiographic images of the head were obtained using MRA technique without contrast. COMPARISON:  None. CTA head and neck 0324 hours today. Noncontrast head CT 0021 hours today and earlier. FINDINGS: MRI HEAD FINDINGS No restricted diffusion to suggest acute infarction. No midline shift, mass effect, evidence of mass lesion, ventriculomegaly, extra-axial collection or acute intracranial hemorrhage. Cervicomedullary junction and pituitary are within normal limits. Major intracranial vascular flow voids are preserved. Patchy T2 and FLAIR hyperintensity in the right pons, and both cerebellar peduncle is a. Similar patchy T2 hyperintensity in the right caudate nucleus. Otherwise scattered small mild to moderate for age nonspecific cerebral white matter T2 and FLAIR hyperintensity, mostly subcortical. No chronic cerebral blood products. No cortical encephalomalacia. Negative visualized cervical spine. Visible internal auditory structures appear normal. Trace inferior mastoid fluid on the left. Negative nasopharynx. Mild to moderate scattered paranasal sinus mucosal thickening. Bilateral orbits soft tissues appear normal. Negative scalp soft tissues. Visualized bone  marrow signal is within normal limits MRA HEAD FINDINGS  Antegrade flow in the posterior circulation with codominant distal vertebral arteries. No distal vertebral artery stenosis. Normal left PICA origin. Dominant appearing right AICA origin is normal. The basilar artery is diminutive but without stenosis. Normal SCA origins. Fetal type bilateral PCA origins, more so the right. Bilateral PCA branches are within normal limits. Antegrade flow in both ICA siphons. Mild siphon irregularity with no siphon stenosis. Ophthalmic and posterior communicating artery origins are normal. Patent carotid termini. Dominant left ACA A1 segment, the right A1 is diminutive. Anterior communicating artery and visualized bilateral ACA branches are within normal limits. Bilateral MCA M1 segments and MCA bifurcations are within normal limits. Visualized bilateral MCA branches are within normal limits IMPRESSION: 1. No acute infarct. 2. Nonspecific but favor chronic small vessel disease related signal abnormality in the right pons, right caudate, bilateral cerebellar peduncles and cerebral white matter. Demyelinating disease is the main differential consideration. 3.  Negative intracranial MRA. Electronically Signed   By: Genevie Ann M.D.   On: 08/23/2015 12:00      Lab Results  Component Value Date   HGBA1C 6.8* 07/14/2015   HGBA1C 6.6* 03/07/2015   HGBA1C 6.7* 11/17/2014   Lab Results  Component Value Date   MICROALBUR 1.6 07/14/2015   LDLCALC 125* 08/24/2015   CREATININE 0.79 08/23/2015       Scheduled Meds: . aspirin  300 mg Rectal Daily   Or  . aspirin  325 mg Oral Daily  . atenolol  100 mg Oral Daily  . cloNIDine  0.2 mg Oral BID  . enalapril  20 mg Oral BID  . enoxaparin (LOVENOX) injection  40 mg Subcutaneous Daily  . insulin aspart  0-5 Units Subcutaneous QHS  . insulin aspart  0-9 Units Subcutaneous TID WC  . omega-3 acid ethyl esters  1 g Oral Daily  . sodium chloride flush  3 mL Intravenous Q12H  . 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 If 7PM-7AM, please contact night-coverage at [email or web address], password [facility] 08/24/2015, 11:43 AM  LOS: 1 day

## 2015-08-24 NOTE — Evaluation (Signed)
Physical Therapy Evaluation Patient Details Name: Michele [name] MRN: [id number] DOB: 08-16-1945 Today's Date: 08/24/2015   History of Present Illness  Pt 70 y.o. female with PMHx of HTN, HLD, DM and FMHx of brain aneurysm who presented to the ED complaining of gradual onset, constant HTN, ongoing for a few weeks. Pt alsohad been experiencing headaches and vision disturbances as well as occasional lightheadedness/dizziness. CT of the head showed 2 focal 9 mm hypodensities, one in the right caudate and the second in the right pons suspicious for possible early/acute versus subacute ischemia.    Clinical Impression  Patient evaluated by Physical Therapy with no further acute PT needs identified.  PT is signing off. Thank you for this referral.     Follow Up Recommendations No PT follow up    Equipment Recommendations  None recommended by PT    Recommendations for Other Services       Precautions / Restrictions Precautions Precautions: Fall Restrictions Weight Bearing Restrictions: No      Mobility  Bed Mobility Overal bed mobility: Modified Independent             General bed mobility comments: used bed rails, educated on log roll technique  Transfers Overall transfer level: Independent Equipment used: None             General transfer comment: slightly extra time  Ambulation/Gait Ambulation/Gait assistance: Independent Ambulation Distance (Feet): 250 Feet Assistive device: None Gait Pattern/deviations: WFL(Within Functional Limits)   Gait velocity interpretation: at or above normal speed for age/gender    Stairs            Wheelchair Mobility    Modified Rankin (Stroke Patients Only) Modified Rankin (Stroke Patients Only) Pre-Morbid Rankin Score: No symptoms Modified Rankin: No symptoms     Balance Overall balance assessment: Independent         Standing balance support: No upper extremity supported Standing balance-Leahy Scale:  Normal           Rhomberg - Eyes Opened: 30 [place]berg - Eyes Closed: 15 High level balance activites: Side stepping;Backward walking;Direction changes;Turns;Sudden stops;Head turns High Level Balance Comments: no imbalance noted by pt or PT Standardized Balance Assessment Standardized Balance Assessment : Berg Balance Test Berg Balance Test Sit to Stand: Able to stand without using hands and stabilize independently Standing Unsupported: Able to stand safely 2 minutes Sitting with Back Unsupported but Feet Supported on Floor or Stool: Able to sit safely and securely 2 minutes Stand to Sit: Sits safely with minimal use of hands Transfers: Able to transfer safely, minor use of hands Standing Unsupported with Eyes Closed: Able to stand 10 seconds safely Standing Ubsupported with Feet Together: Able to place feet together independently and stand 1 minute safely From Standing, Reach Forward with Outstretched Arm: Can reach confidently >25 cm (10") From Standing Position, Pick up Object from Floor: Able to pick up shoe safely and easily From Standing Position, Turn to Look Behind Over each Shoulder: Looks behind from both sides and weight shifts well Turn 360 Degrees: Able to turn 360 degrees safely in 4 seconds or less Standing on One Leg: Able to lift leg independently and hold equal to or more than 3 seconds         Pertinent Vitals/Pain Pain Assessment: Faces Faces Pain Scale: Hurts even more Pain Location: lumbar puncture site/back Pain Descriptors / Indicators: Pressure (with bending forward) Pain Intervention(s): Limited activity within patient's tolerance;Repositioned    Home Living Family/patient expects to be discharged  to:: Private residence Living Arrangements: Spouse/significant other Available Help at Discharge: Family Type of Home: House Home Access: Stairs to enter Entrance Stairs-Rails: Left Entrance Stairs-Number of Steps: 3 Home Layout: One level Home  Equipment: None Additional Comments[place] 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    Prior Function Level of Independence: Independent               Hand Dominance   Dominant Hand: Right    Extremity/Trunk Assessment   Upper Extremity Assessment: Overall WFL for tasks assessed           Lower Extremity Assessment: Overall WFL for tasks assessed      Cervical / Trunk Assessment: Normal  Communication   Communication: No difficulties  Cognition Arousal/Alertness: Awake/alert Behavior During Therapy: WFL for tasks assessed/performed Overall Cognitive Status: Within Functional Limits for tasks assessed                      General Comments      Exercises        Assessment/Plan    PT Assessment Patent does not need any further PT services  PT Diagnosis Difficulty walking   PT Problem List    PT Treatment Interventions     PT Goals (Current goals can be found in the Care Plan section) Acute Rehab PT Goals Patient Stated Goal: get home to help husband mow yards PT Goal Formulation: All assessment and education complete, DC therapy    Frequency     Barriers to discharge        Co-evaluation               End of Session Equipment Utilized During Treatment: Gait belt Activity Tolerance: Patient tolerated treatment well Patient left: in bed;with call bell/phone within reach Nurse Communication: Mobility status;Other (comment) (transfer sheet signed off; ok with up alone)    Functional Assessment Tool Used: clinical judgement Functional Limitation: Mobility: Walking and moving around Mobility: Walking and Moving Around Current Status [phone]): 0 percent impaired, limited or restricted Mobility: Walking and Moving Around Goal Status [phone]): 0 percent impaired, limited or restricted Mobility: Walking and Moving Around Discharge Status [phone]): 0 percent impaired, limited or restricted    Time: 6269-4854 PT Time Calculation (min) (ACUTE  ONLY): 11 min   Charges:   PT Evaluation $PT Eval Low Complexity: 1 Procedure     PT G Codes:   PT G-Codes **NOT FOR INPATIENT CLASS** Functional Assessment Tool Used: clinical judgement Functional Limitation: Mobility: Walking and moving around Mobility: Walking and Moving Around Current Status (O2703): 0 percent impaired, limited or restricted Mobility: Walking and Moving Around Goal Status (J0093): 0 percent impaired, limited or restricted Mobility: Walking and Moving Around Discharge Status (G1829): 0 percent impaired, limited or restricted    Michele [name] 08/24/2015, 3:34 PM Pager [phone]

## 2015-08-24 NOTE — Progress Notes (Signed)
Echocardiogram 2D Echocardiogram has been performed.  Joelene Millin 08/24/2015, 11:54 AM

## 2015-08-24 NOTE — Evaluation (Signed)
Speech Language Pathology Evaluation Patient Details Name: Michele [name] MRN: [id number] DOB: 07/10/1945 Today's Date: 08/24/2015 Time: 1520-1550 SLP Time Calculation (min) (ACUTE ONLY): 30 min  Problem List:  Patient Active Problem List   Diagnosis Date Noted  . Stroke ([place]) 08/23/2015  . Diabetes mellitus ([place]) 08/23/2015  . Acute ischemic stroke ([place])   . Headache   . Hypertensive emergency   . Gout 09/22/2014  . Vitamin D deficiency 06/28/2013  . Medication management 06/28/2013  . Anemia 06/28/2013  . 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 Mixed hyperlipidemia 03/21/2008  . Essential hypertension 03/21/2008  . GERD 03/21/2008  . FATTY LIVER DISEASE 03/21/2008  . COLONIC POLYPS, ADENOMATOUS, HX OF 03/21/2008   Past Medical History:  Past Medical History  Diagnosis Date  . Hypertension   . Carpal tunnel syndrome   . Tubulovillous adenoma of colon   . GERD (gastroesophageal reflux disease)   . Hyperlipidemia   . 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 Fatty liver   . Esophageal motility disorder   . Duodenal diverticulum   . Anxiety   . Arthritis   . Type II or unspecified type diabetes mellitus without mention of complication, not stated as uncontrolled    Past Surgical History:  Past Surgical History  Procedure Laterality Date  . Appendectomy    . Tonsillectomy    . Carpal tunnel release    . Cyst removal hand     HPI:  Michele [name] is an 70 y.o. female with PMHx of HTN, HLD, DM and FMHx of brain aneurysm who presented to the Emergency Department complaining of gradual onset, constant hypertension, ongoing for a few weeks.  CT of the head showed 2 focal 9 mm hypodensities, one in the right caudate and the second in the right pons suspicious for possible early/acute versus subacute ischemia. MRI negative for acute changes.   Assessment / Plan / Recommendation Clinical Impression   Pt presents with grossly intact cognitive-linguistic function and scored a  26/30 on the Mo[employer] standardized cognitive assessment (n>/= 26).  Pt's speech is fluent and free from dysarthria or word finding impairment.  Pt reports return to cognitive baseline.  As a result, no further ST needs are indicated at this time.      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    Comprehension  Auditory Comprehension Overall Auditory Comprehension: Appears within functional limits for tasks assessed    Expression Verbal Expression Overall Verbal Expression: Appears within functional limits for tasks assessed Written Expression Dominant Hand: Right   Oral / Motor  Oral Motor/Sensory Function Overall Oral Motor/Sensory Function: Within functional limits Motor Speech Overall Motor Speech: Appears within functional limits for tasks assessed   GO          Functional Assessment Tool Used: cognitive evaluation completed  Functional Limitations: Memory Memory Current Status [id number]): 0 percent impaired, limited or restricted Memory Goal Status [id number]): 0 percent impaired, limited or restricted Memory Discharge Status [id number]): 0 percent impaired, limited or restricted         Emilio Math 08/24/2015, 4:30  PM

## 2015-08-24 NOTE — Care Management Note (Signed)
Case Management Note  Patient Details  Name: Michele [name] MRN: [id number] Date of Birth: 22-May-1946  Subjective/Objective:    Patient admitted to r/o CVA. MRI results negative for stroke. Patient is from home with her spouse.                 Action/Plan: Awaiting Neuro consult and PT/OT recs. CM following for discharge disposition.   Expected Discharge Date:   (UNKNOWN)               Expected Discharge Plan:     In-House Referral:     Discharge planning Services     Post Acute Care Choice:    Choice offered to:     DME Arranged:    DME Agency:     HH Arranged:    [place] Agency:     Status of Service:     Medicare Important Message Given:    Date Medicare IM Given:    Medicare IM give by:    Date Additional Medicare IM Given:    Additional Medicare Important Message give by:     If discussed at [place] of Stay Meetings, dates discussed:    Additional Comments:  Pollie Friar, RN 08/24/2015, 2:05 PM

## 2015-08-25 DIAGNOSIS — G44001 Cluster headache syndrome, unspecified, intractable: Secondary | ICD-10-CM

## 2015-08-25 DIAGNOSIS — E0800 Diabetes mellitus due to underlying condition with hyperosmolarity without nonketotic hyperglycemic-hyperosmolar coma (NKHHC): Secondary | ICD-10-CM | POA: Diagnosis not present

## 2015-08-25 DIAGNOSIS — I639 Cerebral infarction, unspecified: Secondary | ICD-10-CM | POA: Diagnosis not present

## 2015-08-25 DIAGNOSIS — I6300 Cerebral infarction due to thrombosis of unspecified precerebral artery: Secondary | ICD-10-CM | POA: Diagnosis not present

## 2015-08-25 DIAGNOSIS — I10 Essential (primary) hypertension: Secondary | ICD-10-CM | POA: Diagnosis not present

## 2015-08-25 LAB — CBC
HEMATOCRIT: 38.9 % (ref 36.0–46.0)
Hemoglobin: 12.7 g/dL (ref 12.0–15.0)
MCH: 27.3 pg (ref 26.0–34.0)
MCHC: 32.6 g/dL (ref 30.0–36.0)
MCV: 83.7 fL (ref 78.0–100.0)
Platelets: 135 10*3/uL — ABNORMAL LOW (ref 150–400)
RBC: 4.65 MIL/uL (ref 3.87–5.11)
RDW: 14.3 % (ref 11.5–15.5)
WBC: 5.5 10*3/uL (ref 4.0–10.5)

## 2015-08-25 LAB — GLUCOSE, CAPILLARY
GLUCOSE-CAPILLARY: 117 mg/dL — AB (ref 65–99)
GLUCOSE-CAPILLARY: 145 mg/dL — AB (ref 65–99)

## 2015-08-25 LAB — COMPREHENSIVE METABOLIC PANEL
ALT: 25 U/L (ref 14–54)
AST: 20 U/L (ref 15–41)
Albumin: 3.5 g/dL (ref 3.5–5.0)
Alkaline Phosphatase: 79 U/L (ref 38–126)
Anion gap: 10 (ref 5–15)
BILIRUBIN TOTAL: 0.8 mg/dL (ref 0.3–1.2)
BUN: 11 mg/dL (ref 6–20)
CALCIUM: 9.3 mg/dL (ref 8.9–10.3)
CO2: 24 mmol/L (ref 22–32)
Chloride: 106 mmol/L (ref 101–111)
Creatinine, Ser: 0.79 mg/dL (ref 0.44–1.00)
GFR calc Af Amer: >60 mL/min (ref >60)
Glucose, Bld: 128 mg/dL — ABNORMAL HIGH (ref 65–99)
POTASSIUM: 3.6 mmol/L (ref 3.5–5.1)
Sodium: 140 mmol/L (ref 135–145)
TOTAL PROTEIN: 5.9 g/dL — AB (ref 6.5–8.1)

## 2015-08-25 LAB — HEMOGLOBIN A1C
Hgb A1c MFr Bld: 6.2 % — ABNORMAL HIGH (ref 4.8–5.6)
Mean Plasma Glucose: 131 mg/dL

## 2015-08-25 MED ORDER — ROSUVASTATIN CALCIUM 20 MG PO TABS: 20.0000 mg | ORAL_TABLET | Freq: Every day | ORAL | Status: DC

## 2015-08-25 MED ORDER — ASPIRIN EC 81 MG PO TBEC: 81.0000 mg | DELAYED_RELEASE_TABLET | Freq: Every day | ORAL | Status: DC

## 2015-08-25 MED ORDER — CLONIDINE HCL 0.2 MG PO TABS: 0.2000 mg | ORAL_TABLET | Freq: Two times a day (BID) | ORAL | Status: DC

## 2015-08-25 NOTE — Progress Notes (Signed)
Discharge instructions reviewed with patient/family. All questions answered at this time. Transport home per family.  Aleasha Fregeau, RN 

## 2015-08-25 NOTE — Care Management Note (Signed)
Case Management Note  Patient Details  Name: Michele [name] MRN: [id number] Date of Birth: 11/05/1945  Subjective/Objective:                    Action/Plan: Patient discharging home with self care. No follow needs per PT/OT. No further needs per CM.   Expected Discharge Date:   (UNKNOWN)               Expected Discharge Plan:     In-House Referral:     Discharge planning Services     Post Acute Care Choice:    Choice offered to:     DME Arranged:    DME Agency:     HH Arranged:    [place] Agency:     Status of Service:     Medicare Important Message Given:    Date Medicare IM Given:    Medicare IM give by:    Date Additional Medicare IM Given:    Additional Medicare Important Message give by:     If discussed at [place] of Stay Meetings, dates discussed:    Additional Comments:  Pollie Friar, RN 08/25/2015, 2:17 PM

## 2015-08-25 NOTE — Discharge Summary (Addendum)
Physician Discharge Summary  Michele [name] MRN: [id number] DOB/AGE: 1946-04-23 70 y.o.  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 This may be contributing to patient's uncontrolled hypertension     Medication List    TAKE these medications        ACCU-CHEK NANO SMARTVIEW w/Device Kit  CHECK BLOOD GLUCOSE THREE TIMES DAILY BEFORE MEALS     ACCU-CHEK SMARTVIEW test strip  Generic drug:  glucose blood  USE TO CHECK BLOOD GLUCOSE EVERY DAY     aspirin EC 81 MG tablet  Take 1 tablet (81 mg total) by mouth daily.     atenolol 100 MG tablet  Commonly known as:  TENORMIN  Take 1 tablet (100 mg total) by mouth daily.     Biotin 1000 MCG tablet  Take 1,000 mcg by mouth daily. Reported on 08/22/2015     citalopram 20 MG tablet  Commonly known as:  CELEXA  TAKE 1 TABLET EVERY DAY     cloNIDine 0.2 MG tablet  Commonly known as:  CATAPRES  Take 1 tablet (0.2 mg total) by mouth 2 (two) times daily.     enalapril 20 MG tablet  Commonly known as:  VASOTEC  TAKE 1 TABLET TWICE DAILY     Fish Oil 1000 MG Caps  Take 1,000 mg by mouth. Reported on 08/22/2015     FLAXSEED OIL PO  Take 1,000 mg by mouth daily. Reported on 08/22/2015     furosemide 40 MG tablet  Commonly known as:  LASIX  TAKE 1 TABLET EVERY DAY     Iron Tabs  Take 1 tablet by mouth 2 (two) times daily.     metFORMIN 500 MG 24 hr tablet   Commonly known as:  GLUCOPHAGE-XR  TAKE 1 TABLET THREE TIMES DAILY     Red Yeast Rice 600 MG Caps  Take 600 mg by mouth 2 (two) times daily. Reported on 08/22/2015     rosuvastatin 20 MG tablet  Commonly known as:  CRESTOR  Take 1 tablet (20 mg total) by mouth daily at 6 PM.     verapamil 120 MG tablet  Commonly known as:  CALAN  TAKE 1 TABLET TWICE DAILY     VITAMIN D PO  Take 5,000 Units by mouth daily.         Discharge Condition: Stable   Discharge Instructions Get Medicines reviewed and adjusted: Please take all your medications with you for your next visit with your Primary MD  Please request your Primary MD to go over all hospital tests and procedure/radiological results at the follow up, please ask your Primary MD to get all Hospital records sent to his/her office.  If you experience worsening of your admission symptoms, develop shortness of breath, life threatening emergency, suicidal or homicidal thoughts you must seek medical attention immediately by calling 911 or calling your MD immediately if symptoms less severe.  You must read complete instructions/literature along with all the possible  adverse reactions/side effects for all the Medicines you take and that have been prescribed to you. Take any new Medicines after you have completely understood and accpet all the possible adverse reactions/side effects.   Do not drive when taking Pain medications.   Do not take more than prescribed Pain, Sleep and Anxiety Medications  Special Instructions: If you have smoked or chewed Tobacco in the last 2 yrs please stop smoking, stop any regular Alcohol and or any Recreational drug use.  Wear Seat belts while driving.  Please note  You were cared for by a hospitalist during your hospital stay. Once you are discharged, your primary care physician will handle any further medical issues. Please note that NO REFILLS for any discharge medications will be authorized once you  are discharged, as it is imperative that you return to your primary care physician (or establish a relationship with a primary care physician if you do not have one) for your aftercare needs so that they can reassess your need for medications and monitor your lab values.      Discharge Instructions    Diet - low sodium heart healthy    Complete by:  As directed      Increase activity slowly    Complete by:  As directed            Allergies  Allergen Reactions  . Lipitor [Atorvastatin]     Elevates LFT's      Disposition: 01-Home or Self Care   Consults: None     Significant Diagnostic Studies:  Ct Angio Head W/cm &/or Wo Cm  08/23/2015  CLINICAL DATA:  Initial valuation for acute headache, visual disturbance. EXAM: CT ANGIOGRAPHY HEAD AND NECK TECHNIQUE: Multidetector CT imaging of the head and neck was performed using the standard protocol during bolus administration of intravenous contrast. Multiplanar CT image reconstructions and MIPs were obtained to evaluate the vascular anatomy. Carotid stenosis measurements (when applicable) are obtained utilizing NASCET criteria, using the distal internal carotid diameter as the denominator. CONTRAST:  14m OMNIPAQUE IOHEXOL 350 MG/ML SOLN COMPARISON:  Prior noncontrast head CT from earlier same day. FINDINGS: CTA NECK Aortic arch: Visualized aortic arch is of normal caliber with normal branch pattern. No high-grade stenosis at the origin of the great vessels. Visualized subclavian arteries are well opacified bilaterally. Right carotid system: Right common carotid artery patent from its origin to the bifurcation. Right ICA widely patent from the bifurcation to the skullbase. No dissection, stenosis, or vascular occlusion within the right carotid artery system. Right external carotid artery is branches grossly normal. Left carotid system: Left common carotid artery patent from its origin to the bifurcation. Minimal plaque about the proximal  left ICA without high-grade stenosis. Left ICA patent distally to the skullbase without dissection, stenosis, or occlusion. Left external carotid artery and its branches grossly normal. Vertebral arteries:Both vertebral arteries arise from the subclavian arteries. Vertebral arteries well opacified along their entire course without evidence for dissection, stenosis, or occlusion. Skeleton: No acute osseous abnormality. No worrisome lytic or blastic osseous lesions. Other neck: Scattered subsegmental atelectasis present on the visualized lungs. Visualized lungs are otherwise clear. Visualized superior mediastinum within normal limits. Right thyroid lobe somewhat enlarged and heterogeneous in appearance with internal calcification. No adenopathy. No acute soft tissue abnormality. CTA HEAD Anterior circulation: Petrous segments widely patent bilaterally. Scattered atheromatous plaque within the cavernous ICAs with mild diffuse narrowing. No flow limiting stenosis. Supraclinoid segments widely patent. A1 segments patent. Right A1 segment is hypoplastic. Anterior  communicating artery normal. Anterior cerebral arteries well opacified to their distal aspects. M1 segments patent without stenosis or occlusion. MCA bifurcations normal. Distal MCA branches well opacified to their distal aspects. Posterior circulation: Vertebral arteries patent to the vertebrobasilar junction. Posterior inferior cerebral arteries patent bilaterally. Basilar artery widely patent. Superior cerebral arteries patent bilaterally. Fetal type right PCA supplied via a prominent right posterior communicating artery. Left PCA arises from the basilar artery and is well opacified to its distal aspect. Left posterior communicating artery also noted. Venous sinuses: Patent without evidence for venous sinus thrombosis. Anatomic variants: Fetal type right PCA. No aneurysm. No vascular malformation. Delayed phase: No abnormal enhancement on delayed sequence.  Previously noted hypodensities involving the right caudate and pons again noted. IMPRESSION: CTA NECK IMPRESSION: 1. Mild atheromatous plaque about the proximal left ICA without flow-limiting stenosis. 2. Otherwise normal CTA of the neck. No flow limiting or critical stenosis again identified. CTA HEAD IMPRESSION: 1. No large or proximal arterial branch occlusion within the intracranial circulation. No high-grade or correctable stenosis. 2. Scattered atheromatous plaque within the cavernous ICAs bilaterally with secondary mild diffuse narrowing. 3. No intracranial aneurysm identified. 4. Note again made of 2 focal hypodensities, 1 involving the right caudate/internal capsule and the other involving the dorsal pons. Again, these are concerning for possible evolving ischemia. This could be further assessed with dedicated MRI. Electronically Signed   By: Jeannine Boga M.D.   On: 08/23/2015 04:30   Ct Head Wo Contrast  08/23/2015  CLINICAL DATA:  Initial valuation for acute headache, left eye visual disturbance. EXAM: CT HEAD WITHOUT CONTRAST TECHNIQUE: Contiguous axial images were obtained from the base of the skull through the vertex without intravenous contrast. COMPARISON:  Prior study from 06/03/2012. FINDINGS: Generalized age-related cerebral atrophy present. Patchy hypodensity within the periventricular and deep white matter most consistent with chronic small vessel ischemic disease. There is a new focal hypodensity within the right caudate head, indeterminate, but may reflect sequela of acute/ subacute ischemia. In additional vague hypodensity within the right dorsal pons also suspicious for possible acute ischemia (series 2, image 11). No other definite evolving large vessel territory infarct. No acute intracranial hemorrhage. No mass lesion or midline shift. No hydrocephalus. No extra-axial fluid collection. Scalp soft tissues within normal limits. No acute abnormality about the orbits. Left sphenoid  sinus disease noted. Minimal mucosal thickening within the right max O sinus. Scattered opacity within the posterior right ethmoidal air cells. Mastoid air cells are clear. Calvarium intact. IMPRESSION: Two focal 9 mm hypodensities, 1 involving the right caudate, with the second involving the right pons. While these are somewhat age indeterminate, findings are suspicious for possible acute/ early subacute ischemia. Further evaluation with MRI is suggested. Electronically Signed   By: Jeannine Boga M.D.   On: 08/23/2015 02:12   Ct Angio Neck W/cm &/or Wo/cm  08/23/2015  CLINICAL DATA:  Initial valuation for acute headache, visual disturbance. EXAM: CT ANGIOGRAPHY HEAD AND NECK TECHNIQUE: Multidetector CT imaging of the head and neck was performed using the standard protocol during bolus administration of intravenous contrast. Multiplanar CT image reconstructions and MIPs were obtained to evaluate the vascular anatomy. Carotid stenosis measurements (when applicable) are obtained utilizing NASCET criteria, using the distal internal carotid diameter as the denominator. CONTRAST:  152m OMNIPAQUE IOHEXOL 350 MG/ML SOLN COMPARISON:  Prior noncontrast head CT from earlier same day. FINDINGS: CTA NECK Aortic arch: Visualized aortic arch is of normal caliber with normal branch pattern. No high-grade stenosis at the  origin of the great vessels. Visualized subclavian arteries are well opacified bilaterally. Right carotid system: Right common carotid artery patent from its origin to the bifurcation. Right ICA widely patent from the bifurcation to the skullbase. No dissection, stenosis, or vascular occlusion within the right carotid artery system. Right external carotid artery is branches grossly normal. Left carotid system: Left common carotid artery patent from its origin to the bifurcation. Minimal plaque about the proximal left ICA without high-grade stenosis. Left ICA patent distally to the skullbase without  dissection, stenosis, or occlusion. Left external carotid artery and its branches grossly normal. Vertebral arteries:Both vertebral arteries arise from the subclavian arteries. Vertebral arteries well opacified along their entire course without evidence for dissection, stenosis, or occlusion. Skeleton: No acute osseous abnormality. No worrisome lytic or blastic osseous lesions. Other neck: Scattered subsegmental atelectasis present on the visualized lungs. Visualized lungs are otherwise clear. Visualized superior mediastinum within normal limits. Right thyroid lobe somewhat enlarged and heterogeneous in appearance with internal calcification. No adenopathy. No acute soft tissue abnormality. CTA HEAD Anterior circulation: Petrous segments widely patent bilaterally. Scattered atheromatous plaque within the cavernous ICAs with mild diffuse narrowing. No flow limiting stenosis. Supraclinoid segments widely patent. A1 segments patent. Right A1 segment is hypoplastic. Anterior communicating artery normal. Anterior cerebral arteries well opacified to their distal aspects. M1 segments patent without stenosis or occlusion. MCA bifurcations normal. Distal MCA branches well opacified to their distal aspects. Posterior circulation: Vertebral arteries patent to the vertebrobasilar junction. Posterior inferior cerebral arteries patent bilaterally. Basilar artery widely patent. Superior cerebral arteries patent bilaterally. Fetal type right PCA supplied via a prominent right posterior communicating artery. Left PCA arises from the basilar artery and is well opacified to its distal aspect. Left posterior communicating artery also noted. Venous sinuses: Patent without evidence for venous sinus thrombosis. Anatomic variants: Fetal type right PCA. No aneurysm. No vascular malformation. Delayed phase: No abnormal enhancement on delayed sequence. Previously noted hypodensities involving the right caudate and pons again noted.  IMPRESSION: CTA NECK IMPRESSION: 1. Mild atheromatous plaque about the proximal left ICA without flow-limiting stenosis. 2. Otherwise normal CTA of the neck. No flow limiting or critical stenosis again identified. CTA HEAD IMPRESSION: 1. No large or proximal arterial branch occlusion within the intracranial circulation. No high-grade or correctable stenosis. 2. Scattered atheromatous plaque within the cavernous ICAs bilaterally with secondary mild diffuse narrowing. 3. No intracranial aneurysm identified. 4. Note again made of 2 focal hypodensities, 1 involving the right caudate/internal capsule and the other involving the dorsal pons. Again, these are concerning for possible evolving ischemia. This could be further assessed with dedicated MRI. Electronically Signed   By: Jeannine Boga M.D.   On: 08/23/2015 04:30   Mr Jodene Nam Head Wo Contrast  08/23/2015  CLINICAL DATA:  70 year old female with severe headache for 3 days with visual changes. Initial encounter. EXAM: MRI HEAD WITHOUT CONTRAST MRA HEAD WITHOUT CONTRAST TECHNIQUE: Multiplanar, multiecho pulse sequences of the brain and surrounding structures were obtained without intravenous contrast. Angiographic images of the head were obtained using MRA technique without contrast. COMPARISON:  None. CTA head and neck 0324 hours today. Noncontrast head CT 0021 hours today and earlier. FINDINGS: MRI HEAD FINDINGS No restricted diffusion to suggest acute infarction. No midline shift, mass effect, evidence of mass lesion, ventriculomegaly, extra-axial collection or acute intracranial hemorrhage. Cervicomedullary junction and pituitary are within normal limits. Major intracranial vascular flow voids are preserved. Patchy T2 and FLAIR hyperintensity in the right pons, and both cerebellar peduncle  is a. Similar patchy T2 hyperintensity in the right caudate nucleus. Otherwise scattered small mild to moderate for age nonspecific cerebral white matter T2 and FLAIR  hyperintensity, mostly subcortical. No chronic cerebral blood products. No cortical encephalomalacia. Negative visualized cervical spine. Visible internal auditory structures appear normal. Trace inferior mastoid fluid on the left. Negative nasopharynx. Mild to moderate scattered paranasal sinus mucosal thickening. Bilateral orbits soft tissues appear normal. Negative scalp soft tissues. Visualized bone marrow signal is within normal limits MRA HEAD FINDINGS Antegrade flow in the posterior circulation with codominant distal vertebral arteries. No distal vertebral artery stenosis. Normal left PICA origin. Dominant appearing right AICA origin is normal. The basilar artery is diminutive but without stenosis. Normal SCA origins. Fetal type bilateral PCA origins, more so the right. Bilateral PCA branches are within normal limits. Antegrade flow in both ICA siphons. Mild siphon irregularity with no siphon stenosis. Ophthalmic and posterior communicating artery origins are normal. Patent carotid termini. Dominant left ACA A1 segment, the right A1 is diminutive. Anterior communicating artery and visualized bilateral ACA branches are within normal limits. Bilateral MCA M1 segments and MCA bifurcations are within normal limits. Visualized bilateral MCA branches are within normal limits IMPRESSION: 1. No acute infarct. 2. Nonspecific but favor chronic small vessel disease related signal abnormality in the right pons, right caudate, bilateral cerebellar peduncles and cerebral white matter. Demyelinating disease is the main differential consideration. 3.  Negative intracranial MRA. Electronically Signed   By: Genevie Ann M.D.   On: 08/23/2015 12:00   Mr Brain Wo Contrast  08/23/2015  CLINICAL DATA:  70 year old female with severe headache for 3 days with visual changes. Initial encounter. EXAM: MRI HEAD WITHOUT CONTRAST MRA HEAD WITHOUT CONTRAST TECHNIQUE: Multiplanar, multiecho pulse sequences of the brain and surrounding  structures were obtained without intravenous contrast. Angiographic images of the head were obtained using MRA technique without contrast. COMPARISON:  None. CTA head and neck 0324 hours today. Noncontrast head CT 0021 hours today and earlier. FINDINGS: MRI HEAD FINDINGS No restricted diffusion to suggest acute infarction. No midline shift, mass effect, evidence of mass lesion, ventriculomegaly, extra-axial collection or acute intracranial hemorrhage. Cervicomedullary junction and pituitary are within normal limits. Major intracranial vascular flow voids are preserved. Patchy T2 and FLAIR hyperintensity in the right pons, and both cerebellar peduncle is a. Similar patchy T2 hyperintensity in the right caudate nucleus. Otherwise scattered small mild to moderate for age nonspecific cerebral white matter T2 and FLAIR hyperintensity, mostly subcortical. No chronic cerebral blood products. No cortical encephalomalacia. Negative visualized cervical spine. Visible internal auditory structures appear normal. Trace inferior mastoid fluid on the left. Negative nasopharynx. Mild to moderate scattered paranasal sinus mucosal thickening. Bilateral orbits soft tissues appear normal. Negative scalp soft tissues. Visualized bone marrow signal is within normal limits MRA HEAD FINDINGS Antegrade flow in the posterior circulation with codominant distal vertebral arteries. No distal vertebral artery stenosis. Normal left PICA origin. Dominant appearing right AICA origin is normal. The basilar artery is diminutive but without stenosis. Normal SCA origins. Fetal type bilateral PCA origins, more so the right. Bilateral PCA branches are within normal limits. Antegrade flow in both ICA siphons. Mild siphon irregularity with no siphon stenosis. Ophthalmic and posterior communicating artery origins are normal. Patent carotid termini. Dominant left ACA A1 segment, the right A1 is diminutive. Anterior communicating artery and visualized  bilateral ACA branches are within normal limits. Bilateral MCA M1 segments and MCA bifurcations are within normal limits. Visualized bilateral MCA branches are within normal  limits IMPRESSION: 1. No acute infarct. 2. Nonspecific but favor chronic small vessel disease related signal abnormality in the right pons, right caudate, bilateral cerebellar peduncles and cerebral white matter. Demyelinating disease is the main differential consideration. 3.  Negative intracranial MRA. Electronically Signed   By: Genevie Ann M.D.   On: 08/23/2015 12:00      2-D echo LV EF: 55% - 60%  ------------------------------------------------------------------- Indications: CVA 436.  ------------------------------------------------------------------- History: Risk factors: Hypertension. Dyslipidemia.  ------------------------------------------------------------------- Study Conclusions  - Left ventricle: The cavity size was normal. There was moderate  concentric hypertrophy. Systolic function was normal. The  estimated ejection fraction was in the range of 55% to 60%. Wall  motion was normal; there were no regional wall motion  abnormalities. Doppler parameters are consistent with abnormal  left ventricular relaxation (grade 1 diastolic dysfunction). The  E&'e ratio is between 8-15, suggesting indeterminate LV filling  pressure. - Mitral valve: Calcified annulus. There was trivial regurgitation. - Left atrium: The atrium was normal in size. - Atrial septum: No defect or patent foramen ovale was identified. - Inferior vena cava: The vessel was normal in size. The  respirophasic diameter changes were in the normal range (>= 50%),  consistent with normal central venous pressure.  Impressions:  - LVEF 55-60%, moderate LVH, normal wall motion, diastolic  dysfunction, indeterminate LV filling pressure, normal LA size   Filed Weights   08/23/15 1506  Weight: 66.8 kg (147 lb 4.3 oz)      Microbiology: Recent Results (from the past 240 hour(s))  CSF culture     Status: None (Preliminary result)   Collection Time: 08/23/15  5:41 AM  Result Value Ref Range Status   Specimen Description CSF BACK  Final   Special Requests Normal  Final   Gram Stain   Final    CYTOSPIN NO WBC SEEN NO ORGANISMS SEEN Gram Stain Report Called to,Read Back By and Verified With: DENNIS RN AT 0620 ON 03.22.17 BY SHUEA    Culture   Final    NO GROWTH 2 DAYS Performed at [facility]    Report Status PENDING  Incomplete       Blood Culture    Component Value Date/Time   SDES CSF BACK 08/23/2015 0541   SPECREQUEST Normal 08/23/2015 0541   CULT  08/23/2015 0541    NO GROWTH 2 DAYS Performed at [facility] PENDING 08/23/2015 0541      Labs: Results for orders placed or performed during the hospital encounter of 08/22/15 (from the past 48 hour(s))  Glucose, capillary     Status: Abnormal   Collection Time: 08/23/15  4:38 PM  Result Value Ref Range   Glucose-Capillary 171 (H) 65 - 99 mg/dL  Glucose, capillary     Status: Abnormal   Collection Time: 08/23/15  9:35 PM  Result Value Ref Range   Glucose-Capillary 124 (H) 65 - 99 mg/dL   Comment 1 Notify RN    Comment 2 Document in Chart   Hemoglobin A1c     Status: Abnormal   Collection Time: 08/24/15  6:24 AM  Result Value Ref Range   Hgb A1c MFr Bld 6.2 (H) 4.8 - 5.6 %    Comment: (NOTE)         Pre-diabetes: 5.7 - 6.4         Diabetes: >6.4         Glycemic control for adults with diabetes: <7.0    Mean Plasma Glucose  131 mg/dL    Comment: (NOTE) Performed At: [facility] [place], [place] [id number] Lindon Romp MD [id number]   Lipid panel     Status: Abnormal   Collection Time: 08/24/15  6:27 AM  Result Value Ref Range   Cholesterol 203 (H) 0 - 200 mg/dL   Triglycerides 225 (H) <150 mg/dL   HDL 33 (L) >40 mg/dL   Total CHOL/HDL Ratio 6.2 RATIO    VLDL 45 (H) 0 - 40 mg/dL   LDL Cholesterol 125 (H) 0 - 99 mg/dL    Comment:        Total Cholesterol/HDL:CHD Risk Coronary Heart Disease Risk Table                     Men   Women  1/2 Average Risk   3.4   3.3  Average Risk       5.0   4.4  2 X Average Risk   9.6   7.1  3 X Average Risk  23.4   11.0        Use the calculated Patient Ratio above and the CHD Risk Table to determine the patient's CHD Risk.        ATP III CLASSIFICATION (LDL):  <100     mg/dL   Optimal  100-129  mg/dL   Near or Above                    Optimal  130-159  mg/dL   Borderline  160-189  mg/dL   High  >190     mg/dL   Very High   Glucose, capillary     Status: Abnormal   Collection Time: 08/24/15  6:47 AM  Result Value Ref Range   Glucose-Capillary 140 (H) 65 - 99 mg/dL   Comment 1 Notify RN    Comment 2 Document in Chart   Glucose, capillary     Status: Abnormal   Collection Time: 08/24/15 11:47 AM  Result Value Ref Range   Glucose-Capillary 115 (H) 65 - 99 mg/dL  TSH     Status: Abnormal   Collection Time: 08/24/15 11:52 AM  Result Value Ref Range   TSH 0.061 (L) 0.350 - 4.500 uIU/mL  Glucose, capillary     Status: Abnormal   Collection Time: 08/24/15  4:49 PM  Result Value Ref Range   Glucose-Capillary 117 (H) 65 - 99 mg/dL  Glucose, capillary     Status: None   Collection Time: 08/24/15 10:45 PM  Result Value Ref Range   Glucose-Capillary 95 65 - 99 mg/dL   Comment 1 Notify RN    Comment 2 Document in Chart   CBC     Status: Abnormal   Collection Time: 08/25/15  3:20 AM  Result Value Ref Range   WBC 5.5 4.0 - 10.5 K/uL   RBC 4.65 3.87 - 5.11 MIL/uL   Hemoglobin 12.7 12.0 - 15.0 g/dL   HCT 38.9 36.0 - 46.0 %   MCV 83.7 78.0 - 100.0 fL   MCH 27.3 26.0 - 34.0 pg   MCHC 32.6 30.0 - 36.0 g/dL   RDW 14.3 11.5 - 15.5 %   Platelets 135 (L) 150 - 400 K/uL  Comprehensive metabolic panel     Status: Abnormal   Collection Time: 08/25/15  3:20 AM  Result Value Ref Range   Sodium 140 135 -  145 mmol/L   Potassium 3.6 3.5 - 5.1 mmol/L   Chloride  106 101 - 111 mmol/L   CO2 24 22 - 32 mmol/L   Glucose, Bld 128 (H) 65 - 99 mg/dL   BUN 11 6 - 20 mg/dL   Creatinine, Ser 0.79 0.44 - 1.00 mg/dL   Calcium 9.3 8.9 - 10.3 mg/dL   Total Protein 5.9 (L) 6.5 - 8.1 g/dL   Albumin 3.5 3.5 - 5.0 g/dL   AST 20 15 - 41 U/L   ALT 25 14 - 54 U/L   Alkaline Phosphatase 79 38 - 126 U/L   Total Bilirubin 0.8 0.3 - 1.2 mg/dL   GFR calc non Af Amer >60 >60 mL/min   GFR calc Af Amer >60 >60 mL/min    Comment: (NOTE) The eGFR has been calculated using the CKD EPI equation. This calculation has not been validated in all clinical situations. eGFR's persistently <60 mL/min signify possible Chronic Kidney Disease.    Anion gap 10 5 - 15  Glucose, capillary     Status: Abnormal   Collection Time: 08/25/15  6:40 AM  Result Value Ref Range   Glucose-Capillary 117 (H) 65 - 99 mg/dL   Comment 1 Notify RN    Comment 2 Document in Chart   Glucose, capillary     Status: Abnormal   Collection Time: 08/25/15 11:47 AM  Result Value Ref Range   Glucose-Capillary 145 (H) 65 - 99 mg/dL     Lipid Panel     Component Value Date/Time   CHOL 203* 08/24/2015 0627   TRIG 225* 08/24/2015 0627   HDL 33* 08/24/2015 0627   CHOLHDL 6.2 08/24/2015 0627   VLDL 45* 08/24/2015 0627   LDLCALC 125* 08/24/2015 0627     Lab Results  Component Value Date   HGBA1C 6.2* 08/24/2015   HGBA1C 6.8* 07/14/2015   HGBA1C 6.6* 03/07/2015     Lab Results  Component Value Date   MICROALBUR 1.6 07/14/2015   LDLCALC 125* 08/24/2015   CREATININE 0.79 08/25/2015     Michele [name] is an 70 y.o. female with PMHx of HTN, HLD, DM and FMHx of brain aneurysm who presented to the Emergency Department complaining of gradual onset, constant hypertension, ongoing for a few weeks. Pt checks her pressures regularly at home with a highest reading of 190/97. She had a physical in [place] in which her PCP increased her dose of  enalapril. Pt reports severe, generalized headache three days ago and difficulty with her vision two days ago. The patient says she was having "flashes" of light, associated with severe headache. She saw an ophthalmologist who told her that the problem wasn't due to her eyes. She then went to her PCP's office 08/22/15 and was told to come to the ED for further evaluation. Pt states associated occasional lightheadedness/unsteadiness, and fatigue ongoing for approximately six months. Pt's headache is worse when she bends over to pick something up off the floor. No other worsening or alleviating factors noted. No hx of strokes, numbness, tingling, or focal neurological deficits. Pt has BP of 190/96 while roomed in ED. Pt states that her headache is 4/10 in the ED. with CT of the head showed 2 focal 9 mm hypodensities, one in the right caudate and the second in the right pons suspicious for possible early/acute versus subacute ischemia.  Assessment and plan Headache likely secondary to Hypertensive urgency    telemetry.no events   Not a candidate for TPA: No focal deficits on neuro exam, unclear as to when stroke occurred.  CT head shows  2 focal hypodensities within the right caudate head and pons.No acute infarct.  Lipid panel shows triglycerides 225, LDL 125, hemoglobin A1c.   2 D Echocardiogram as above . CT angiogram of the neck negative for flow-limiting or critical stenosis.  swallowing screen ok.no limitations   Neuro checks Q 2 hours x 12 hours, then Q 4 hours.  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  Statin agents are recommended, and the target goal for cholesterol lowering for those with CHD or symptomatic atherosclerotic disease is an LDL-C of <100 mg/dL and LDL-C of <70 mg/dL for very-high-risk persons with multiple risk factors (Class I, Level of Evidence A).   Patients with ischemic stroke or TIA with low HDL cholesterol may be considered for treatment with niacin or gemfibrozil (Class IIb, Level of Evidence B).  Started  patient on Crestor     GERD  Treat symptomatically as needed.      Discharge Exam:  Blood pressure 177/75, pulse 62, temperature 97.7 F (36.5 C), temperature source Oral, resp. rate 18, height '5\' 3"'  (1.6 m), weight 66.8 kg (147 lb 4.3 oz), SpO2 100 %.  General: No acute respiratory distress Lungs: Clear to auscultation bilaterally without wheezes or crackles Cardiovascular: Regular rate and rhythm without murmur gallop or rub normal S1 and S2 Abdomen: Nontender, nondistended, soft, bowel sounds positive, no rebound, no ascites, no appreciable mass Extremities: No significant cyanosis, clubbing, or edema bilateral lower extremities    Follow-up Information    Follow up with Alesia Richards, MD. Schedule an appointment as soon as possible for a visit in 3 days.   Specialty:  Internal Medicine   Contact information:   [address] [place] [address] [phone]       Signed: Reyne Dumas 08/25/2015, 1:57 PM        Time spent >45 mins

## 2015-08-26 LAB — CSF CULTURE
CULTURE: NO GROWTH
SPECIAL REQUESTS: NORMAL

## 2015-08-27 NOTE — Progress Notes (Signed)
   08/24/15 1200  OT Visit Information  Last OT Received On 08/27/15  Assistance Needed +1  History of Present Illness Pt 70 y.o. female with PMHx of HTN, HLD, DM and FMHx of brain aneurysm who presented to the ED complaining of gradual onset, constant HTN, ongoing for a few weeks. Pt alsohad been experiencing headaches and vision disturbances as well as occasional lightheadedness/dizziness. CT of the head showed 2 focal 9 mm hypodensities, one in the right caudate and the second in the right pons suspicious for possible early/acute versus subacute ischemia.  OT G-codes **NOT FOR INPATIENT CLASS**  Functional Assessment Tool Used clinical judgment  Functional Limitation Self care  Self Care Current Status [phone]) CI  Self Care Goal Status [id number]) CI  Self Care Discharge Status [id number]) CI   Late entry for G code status.  Tyrone Schimke OTR/L Pager: [phone]

## 2015-08-28 ENCOUNTER — Ambulatory Visit (INDEPENDENT_AMBULATORY_CARE_PROVIDER_SITE_OTHER): Payer: PPO | Admitting: Internal Medicine

## 2015-08-28 ENCOUNTER — Encounter: Payer: Self-pay | Admitting: Internal Medicine

## 2015-08-28 VITALS — BP 112/74 | HR 64 | Temp 97.1°F | Resp 16 | Ht 63.25 in | Wt 145.5 lb

## 2015-08-28 DIAGNOSIS — G459 Transient cerebral ischemic attack, unspecified: Secondary | ICD-10-CM

## 2015-08-28 DIAGNOSIS — I10 Essential (primary) hypertension: Secondary | ICD-10-CM

## 2015-08-28 DIAGNOSIS — Z79899 Other long term (current) drug therapy: Secondary | ICD-10-CM

## 2015-08-28 DIAGNOSIS — E0590 Thyrotoxicosis, unspecified without thyrotoxic crisis or storm: Secondary | ICD-10-CM | POA: Diagnosis not present

## 2015-08-28 LAB — CATECHOLAMINES, FRACTIONATED, PLASMA
Dopamine: <30 pg/mL (ref 0–48)
Epinephrine: 52 pg/mL (ref 0–62)
Norepinephrine: 175 pg/mL (ref 0–874)

## 2015-08-28 LAB — ALDOSTERONE + RENIN ACTIVITY W/ RATIO
ALDO / PRA RATIO: 7.6 (ref 0.0–30.0)
ALDOSTERONE: 1.4 ng/dL (ref 0.0–30.0)
PRA LC/MS/MS: 0.185 ng/mL/hr (ref 0.167–5.380)

## 2015-08-28 NOTE — Progress Notes (Signed)
Subjective:    Patient ID: Michele [name], female    DOB: June 28, 1945, 70 y.o.   MRN: [id number]  HPI  This nice 70 yo [name] was recently hospitalized 3/21-24 with accelerated HTN and was ultimately felt due to TIA with equivocal MRI for 2 midbrain ischemic areas and as remaining w/u was negative. Patient never demonstrated focal neuro signs's or sx's. For concern for ischemic steal, BP was monitored and gradually drifted toward acceptable levels to allow discharge and f/u as an out-patient. List of BP's are reviewed today and show readings in the 110-160 sys over the last 36 hours.     Patient has had a fluctuating suppressed to normal TSH for over 1 year and had most recently been normal , but was supressed again in the hospital. Patient denies any c/o palpitations or GI sx's.   Medication Sig  . ACCU-CHEK SMARTVIEW test strip USE TO CHECK BLOOD GLUCOSE EVERY DAY  . atenolol (TENORMIN) 100 MG tablet Take 1 tablet (100 mg total) by mouth daily.  . Biotin 1000 MCG tablet Take 1,000 mcg by mouth daily. Reported on 08/22/2015  . Blood Glucose Monitoring Suppl (ACCU-CHEK NANO SMARTVIEW) W/DEVICE KIT CHECK BLOOD GLUCOSE THREE TIMES DAILY BEFORE MEALS  . Cholecalciferol (VITAMIN D PO) Take 5,000 Units by mouth daily.   . citalopram (CELEXA) 20 MG tablet TAKE 1 TABLET EVERY DAY  . cloNIDine (CATAPRES) 0.2 MG tablet Take 1 tablet (0.2 mg total) by mouth 2 (two) times daily.  . enalapril (VASOTEC) 20 MG tablet TAKE 1 TABLET TWICE DAILY (Patient taking differently: TAKE 20 MG BY MOUTH TWICE DAILY)  . Flaxseed, Linseed, (FLAXSEED OIL PO) Take 1,000 mg by mouth daily. Reported on 08/22/2015  . furosemide (LASIX) 40 MG tablet TAKE 1 TABLET EVERY DAY (Patient taking differently: TAKE 40 MG BY MOUTH EVERY DAY)  . Iron TABS Take 1 tablet by mouth 2 (two) times daily.   . metFORMIN (GLUCOPHAGE-XR) 500 MG 24 hr tablet TAKE 1 TABLET THREE TIMES DAILY (Patient taking differently: TAKE 500 MG BY MOUTH THREE TIMES  DAILY)  . Omega-3 Fatty Acids (FISH OIL) 1000 MG CAPS Take 1,000 mg by mouth. Reported on 08/22/2015  . Red Yeast Rice 600 MG CAPS Take 600 mg by mouth 2 (two) times daily. Reported on 08/22/2015  . rosuvastatin (CRESTOR) 20 MG tablet Take 1 tablet (20 mg total) by mouth daily at 6 PM.  . verapamil (CALAN) 120 MG tablet TAKE 1 TABLET TWICE DAILY (Patient taking differently: TAKE 120 MG BY MOUTH  TWICE DAILY)  . aspirin EC 81 MG tablet Take 1 tablet (81 mg total) by mouth daily.   Allergies  Allergen Reactions  . Lipitor [Atorvastatin]     Elevates LFT's   Past Medical History  Diagnosis Date  . Hypertension   . Carpal tunnel syndrome   . Tubulovillous adenoma of colon   . GERD (gastroesophageal reflux disease)   . Hyperlipidemia   . 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 Fatty liver   . Esophageal motility disorder   . Duodenal diverticulum   . Anxiety   . Arthritis   . Type II or unspecified type diabetes mellitus without mention of complication, not stated as uncontrolled    Review of Systems  10 point systems review negative except as above.    Objective:   Physical Exam  BP 112/74 mmHg  Pulse 64  Temp(Src) 97.1 F (36.2 C)  Resp 16  Ht 5' 3.25" (1.607 m)  Wt 145 lb 8  oz (65.998 kg)  BMI 25.56 kg/m2  HEENT - Eac's patent. TM's Nl. EOM's full. PERRLA. NasoOroPharynx clear. Neck - supple. Nl Thyroid. Carotids 2+ & No bruits, nodes, JVD Chest - Clear equal BS w/o Rales, rhonchi, wheezes. Cor - Nl HS. RRR w/o sig MGR. PP 1(+). No edema. Abd - No palpable organomegaly, masses or tenderness. BS nl. MS- FROM w/o deformities. Muscle power, tone and bulk Nl. Gait Nl. Neuro - No obvious Cr N abnormalities. Sensory, motor and Cerebellar functions appear Nl w/o focal abnormalities. Psyche - Mental status normal & appropriate.  No delusions, ideations or obvious mood abnormalities.    Assessment & Plan:   1. Essential hypertension  - continue bid monitoring  2. Transient cerebral  ischemia   3. Hyperthyroidism  - TSH  4. Medication management  - BASIC METABOLIC PANEL WITH GFR - CBC with Differential/Platelet

## 2015-08-29 ENCOUNTER — Other Ambulatory Visit: Payer: Self-pay | Admitting: Internal Medicine

## 2015-08-29 DIAGNOSIS — E0590 Thyrotoxicosis, unspecified without thyrotoxic crisis or storm: Secondary | ICD-10-CM

## 2015-08-29 LAB — CBC WITH DIFFERENTIAL/PLATELET
BASOS ABS: 0.1 10*3/uL (ref 0.0–0.1)
Basophils Relative: 1 % (ref 0–1)
EOS PCT: 5 % (ref 0–5)
Eosinophils Absolute: 0.3 10*3/uL (ref 0.0–0.7)
HEMATOCRIT: 42.9 % (ref 36.0–46.0)
Hemoglobin: 14.4 g/dL (ref 12.0–15.0)
LYMPHS ABS: 2.5 10*3/uL (ref 0.7–4.0)
LYMPHS PCT: 37 % (ref 12–46)
MCH: 27.7 pg (ref 26.0–34.0)
MCHC: 33.6 g/dL (ref 30.0–36.0)
MCV: 82.5 fL (ref 78.0–100.0)
MONOS PCT: 11 % (ref 3–12)
MPV: 8.9 fL (ref 8.6–12.4)
Monocytes Absolute: 0.7 10*3/uL (ref 0.1–1.0)
NEUTROS PCT: 46 % (ref 43–77)
Neutro Abs: 3.1 10*3/uL (ref 1.7–7.7)
Platelets: 227 10*3/uL (ref 150–400)
RBC: 5.2 MIL/uL — ABNORMAL HIGH (ref 3.87–5.11)
RDW: 15 % (ref 11.5–15.5)
WBC: 6.7 10*3/uL (ref 4.0–10.5)

## 2015-08-29 LAB — TSH: TSH: 0.08 mIU/L — ABNORMAL LOW

## 2015-08-29 LAB — BASIC METABOLIC PANEL WITH GFR
BUN: 20 mg/dL (ref 7–25)
CHLORIDE: 99 mmol/L (ref 98–110)
CO2: 29 mmol/L (ref 20–31)
CREATININE: 1.14 mg/dL — AB (ref 0.50–0.99)
Calcium: 9.6 mg/dL (ref 8.6–10.4)
GFR, EST NON AFRICAN AMERICAN: 49 mL/min — AB (ref >=60)
GFR, Est African American: 57 mL/min — ABNORMAL LOW (ref >=60)
GLUCOSE: 128 mg/dL — AB (ref 65–99)
Potassium: 4.1 mmol/L (ref 3.5–5.3)
SODIUM: 138 mmol/L (ref 135–146)

## 2015-09-06 ENCOUNTER — Telehealth: Payer: Self-pay | Admitting: *Deleted

## 2015-09-06 ENCOUNTER — Other Ambulatory Visit: Payer: Self-pay | Admitting: Internal Medicine

## 2015-09-06 NOTE — Telephone Encounter (Signed)
Patient's spouse called and reported her BP to be 85/46 and pulse was 38 bpm. He sttes she is shakey with no energy and not eating well.  Per Dr Melford Aase, the patient needs to go to [facility] ER to be evaluated and possibly get IV fluids.  Spouse aware and said he would discuss with the patient.

## 2015-09-11 ENCOUNTER — Encounter: Payer: Self-pay | Admitting: Internal Medicine

## 2015-09-11 ENCOUNTER — Ambulatory Visit (INDEPENDENT_AMBULATORY_CARE_PROVIDER_SITE_OTHER): Payer: PPO | Admitting: Internal Medicine

## 2015-09-11 VITALS — BP 122/74 | HR 72 | Temp 97.5°F | Resp 16 | Ht 63.25 in | Wt 146.4 lb

## 2015-09-11 DIAGNOSIS — Z79899 Other long term (current) drug therapy: Secondary | ICD-10-CM

## 2015-09-11 DIAGNOSIS — Z8639 Personal history of other endocrine, nutritional and metabolic disease: Secondary | ICD-10-CM | POA: Insufficient documentation

## 2015-09-11 DIAGNOSIS — E0590 Thyrotoxicosis, unspecified without thyrotoxic crisis or storm: Secondary | ICD-10-CM | POA: Diagnosis not present

## 2015-09-11 LAB — BASIC METABOLIC PANEL WITH GFR
BUN: 14 mg/dL (ref 7–25)
CHLORIDE: 101 mmol/L (ref 98–110)
CO2: 28 mmol/L (ref 20–31)
Calcium: 9.5 mg/dL (ref 8.6–10.4)
Creat: 0.74 mg/dL (ref 0.50–0.99)
GFR, EST NON AFRICAN AMERICAN: 83 mL/min (ref >=60)
Glucose, Bld: 132 mg/dL — ABNORMAL HIGH (ref 65–99)
POTASSIUM: 4.1 mmol/L (ref 3.5–5.3)
Sodium: 140 mmol/L (ref 135–146)

## 2015-09-11 LAB — TSH: TSH: 0.06 m[IU]/L — AB

## 2015-09-11 NOTE — Patient Instructions (Signed)
Hyperthyroidism Hyperthyroidism is when the thyroid is too active (overactive). Your thyroid is a large gland that is located in your neck. The thyroid helps to control how your body uses food (metabolism). When your thyroid is overactive, it produces too much of a hormone called thyroxine.  CAUSES Causes of hyperthyroidism may include:  Graves disease. This is when your immune system attacks the thyroid gland. This is the most common cause.  Inflammation of the thyroid gland.  Tumor in the thyroid gland or somewhere else.  Excessive use of thyroid medicines, including:  Prescription thyroid supplement.  Herbal supplements that mimic thyroid hormones.  Solid or fluid-filled lumps within your thyroid gland (thyroid nodules).  Excessive ingestion of iodine. RISK FACTORS  Being female.  Having a family history of thyroid conditions. SIGNS AND SYMPTOMS Signs and symptoms of hyperthyroidism may include:  Nervousness.  Inability to tolerate heat.  Unexplained weight loss.  Diarrhea.  Change in the texture of hair or skin.  Heart skipping beats or making extra beats.  Rapid heart rate.  Loss of menstruation.  Shaky hands.  Fatigue.  Restlessness.  Increased appetite.  Sleep problems.  Enlarged thyroid gland or nodules. DIAGNOSIS  Diagnosis of hyperthyroidism may include:  Medical history and physical exam.  Blood tests.  Ultrasound tests. TREATMENT Treatment may include:  Medicines to control your thyroid.  Surgery to remove your thyroid.  Radiation therapy. HOME CARE INSTRUCTIONS   Take medicines only as directed by your health care provider.  Do not use any tobacco products, including cigarettes, chewing tobacco, or electronic cigarettes. If you need help quitting, ask your health care provider.  Do not exercise or do physical activity until your health care provider approves.  Keep all follow-up appointments as directed by your health care  provider. This is important. SEEK MEDICAL CARE IF:  Your symptoms do not get better with treatment.  You have fever.  You are taking thyroid replacement medicine and you:  Have depression.  Feel mentally and physically slow.  Have weight gain. SEEK IMMEDIATE MEDICAL CARE IF:   You have decreased alertness or a change in your awareness.  You have abdominal pain.  You feel dizzy.  You have a rapid heartbeat.  You have an irregular heartbeat.  ++++++++++++++++++++++++++++++++++++++++++++++++++++++++  Radioiodine (I-131) Therapy for Hyperthyroidism Radioiodine (I-131) therapy for hyperthyroidism is a procedure used to treat an overactive thyroid (hyperthyroidism). The patient swallows I-131, which is a radioactive form of iodine. The I-131 destroys thyroid cells. The thyroid is a gland in the neck. It makes thyroid hormones, which control how cells throughout the body use energy. Hyperthyroidism is usually caused by Grave's disease or by growths within the thyroid (nodules). Symptoms may include:  Nervousness.  Irritability.  Problems with sleep.  Tiredness.  Fast heart rate.  Shaky hands.  Sweating.  Heat sensitivity.  Unintended weight loss.  Brittle hair.  Enlarged thyroid gland.  Menstrual changes.  Frequent stools. LET YOUR CAREGIVER KNOW ABOUT:   All allergies.  All medications that you are taking, including over-the-counter and prescription drugs, dietary supplements, vitamins, or herbal preparations.  Any previous complications from this or other procedures.  Smoking history.  Possibility of pregnancy.  History of bleeding problems.  Any other health problems. RISKS AND COMPLICATIONS  Risks of the procedure include:  Slight pain in the area of the thyroid gland. BEFORE THE PROCEDURE  If you are a woman, you may be asked to have pregnancy testing before the procedure.  If you have been taking  thyroid medicines, you will usually be  asked to stop them three days before your procedure.  You will usually be asked to stop eating and drinking at midnight the day of your procedure.  You will need to plan for someone to drive you home after the procedure. PROCEDURE  You will be asked to swallow the radioactive iodine in either pill or liquid form. It can take 1-3 months for the treatment to work. The treatment is most effective at about 3-6 months after the dose of iodine has been given. In most people, a single dose of radioactive iodine resolves their hyperthyroidism, but a few people will require a second dose.  AFTER THE PROCEDURE  Because you will be giving off tiny amounts of radiation for several days, there are some special precautions you will be asked to follow for about 2-4 days after the procedure:  Avoid being around babies or pregnant women.  Do not use public bathrooms.  Flush twice after using the toilet.  Take a bath or shower every day.  Practice good hand washing.  Drink fluids normally.  Use disposable utensils, or clean your utensils separately from those of others.  Sleep alone.  Do not engage in intimate contact.  Wash your sheets, towels, and clothes each day, by themselves.  Do not make food for other people. Other precautions include:  Stopping breastfeeding.  Do not attempt to become pregnant for at least a year after you have had the procedure.  When traveling, bring a letter of explanation from your caregiver for three months. Radioactivity may trip detectors in airports or other places. Because the point of the procedure is to destroy your thyroid gland, you will need to take thyroid hormone by mouth for the rest of your life. HOME CARE INSTRUCTIONS   Ask your caregiver when you should resume or start thyroid medications.  Take all medications exactly as directed.  Follow any prescribed diet.  Follow instructions regarding both rest and physical activity. SEEK IMMEDIATE  MEDICAL CARE IF:  You have a dry mouth.  You have a sore throat.  You have neck pain.  You have a tight sensation in the throat.  You have nausea and vomiting.  You are fatigued.  You have flushing.  You have bowel changes (either diarrhea or constipation).     ++++++++++++++++++++++++++++++++++++++++++++++++  Refer to this sheet in the next few weeks. These instructions provide you with information about caring for yourself after your procedure. Your health care provider may also give you more specific instructions. Your treatment has been planned according to current medical practices, but problems sometimes occur. Call your health care provider if you have any problems or questions after your procedure.  WHAT TO EXPECT AFTER THE PROCEDURE  After your procedure, it is common to have a sore throat or mild neck pain for several months.  HOME CARE INSTRUCTIONS For the First 48 Hours After the Procedure:  Do not use public bathrooms.  Flush twice after using the toilet.  Take a bath or shower every day.  Do not make food for other people.  Wash your sheets, towels, and clothes each day, by themselves. Pregnancy and Breastfeeding  Do not attempt to have children for as long as you are told by your health care provider. This may be for up to 1 year after your procedure. Use a method of birth control (contraception) to prevent pregnancy. Talk to your health care provider about what form of contraception is right for you.  Do not breastfeed for as long as you are told by your health care provider, if this applies. General Instructions  Avoid close contact with other people. It is most important to avoid contact with pregnant women and children. Do this for 1 week after your procedure.  Try to keep a distance of about 3 feet from others.  Sleep alone. Do not have intimate contact.  If you have children, arrange for child care.  Do not ride in vehicles with other  people.  Do not stay in a hotel.  Take over-the-counter and prescription medicines only as told by your health care provider. This includes any thyroid medicines.  When traveling, carry a note from your health care provider to explain that you have had radioiodine therapy. This is because radioactivity may set off detectors in airports or other places.  Do not share utensils, such as silverware, plates, or cups. Use disposable utensils, or clean your utensils separately from those of others.  Wash your hands often. If soap and water are not available, use hand sanitizer.  Drink enough fluid to keep your urine clear or pale yellow.  Keep all follow-up visits as told by your health care provider. This is important. Follow-up visits may be required every 1-2 months after treatment. SEEK MEDICAL CARE IF:   You have pain that gets worse or does not get better with medicine.  You have a dry mouth.  You lose your sense of taste.  You become pregnant within 1 year of your procedure, if this applies.  You feel unusually tired (fatigued).  You have very dry skin.  You start to lose your hair.  You have bowel movements that are less frequent or more difficult than usual (constipation).  You have unexplained weight gain.  You always feel cold.   This information is not intended to replace advice given to you by your health care provider. Make sure you discuss any questions you have with your health care provider.   Document Released: 02/08/2015 Document Reviewed: 09/14/2014 Elsevier Interactive Patient Education [employer].

## 2015-09-11 NOTE — Progress Notes (Signed)
Subjective:    Patient ID: Michele [name], female    DOB: June 21, 1945, 70 y.o.   MRN: [id number]  HPI  This pleasant and generally spry 70 yo [name] returns for f/u with recent hospitalization for Accelerated HTN at which time she was noted to have a suppressed TSH of 0.061 on 08/24/15. Co-incidentally she had a couple of IV contrasted studies the day before. TSH was repeated at f/u on 08/28/2015 and was still suppressed at 0.080. Pt's BP's have remained WNL and she denies and palpitations, tremor or diarrhea. She relates he doesn't fell well , but is very vague to define any specific sx's.   Medication Sig  . aspirin 325 MG EC tablet Take 325 mg by mouth daily.  Marland Kitchen atenolol  100 MG tablet Take 1 tablet (100 mg total) by mouth daily.  . Biotin 1000 MCG tablet Take 1,000 mcg by mouth daily  . VITAMIN D  Take 5,000 Units by mouth daily.   . citalopram  20 MG tablet TAKE 1 TABLET EVERY DAY  . cloNIDine  0.2 MG tablet Take 1 tablet (0.2 mg total) by mouth 2 (two) times daily.  . enalapril  20 MG tablet TAKE 1 TABLET TWICE DAILY  . FLAXSEED OIL  Take 1,000 mg by mouth daily.   . furosemide  40 MG tablet TAKE 1 TABLET EVERY DAY   . Iron TABS Take 1 tablet by mouth 2 (two) times daily.   . metFORMIN-XR 500 MG 24 hr tablet TAKE 1 TABLET THREE TIMES DAILY   . Omega-3 FISH OIL 1000 MG  Take 1,000 mg by mouth.  . Red Yeast Rice 600 MG  Take  2  times daily.  . rosuvastatin  20 MG tablet Take 1 tab daily at 6 PM.  . verapamil (CALAN) 120 MG tablet TAKE 1 TABLET TWICE DAILY    Allergies  Allergen Reactions  . Lipitor [Atorvastatin]     Elevates LFT's   Past Medical History  Diagnosis Date  . Hypertension   . Carpal tunnel syndrome   . Tubulovillous adenoma of colon   . GERD (gastroesophageal reflux disease)   . Hyperlipidemia   . 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 Fatty liver   . Esophageal motility disorder   . Duodenal diverticulum   . Anxiety   . Arthritis   . Type II or unspecified type  diabetes mellitus without mention of complication, not stated as uncontrolled    Review of Systems  10 point systems review negative except as above.    Objective:   Physical Exam  BP 122/74 mmHg  Pulse 72  Temp(Src) 97.5 F (36.4 C)  Resp 16  Ht 5' 3.25" (1.607 m)  Wt 146 lb 6.4 oz (66.407 kg)  BMI 25.71 kg/m2  HEENT - Eac's patent. TM's Nl. EOM's full. PERRLA. NasoOroPharynx clear. Neck - supple. Nl Thyroid. Carotids 2+ & No bruits, nodes, JVD Chest - Clear equal BS w/o Rales, rhonchi, wheezes. Cor - Nl HS. RRR w/o sig MGR. PP 1(+). No edema. Abd - No palpable organomegaly, masses or tenderness. BS nl. MS- FROM w/o deformities. Muscle power, tone and bulk Nl. Gait Nl. Neuro - No obvious Cr N abnormalities. Sensory, motor and Cerebellar functions appear Nl w/o focal abnormalities. Psyche - Mental status normal & appropriate.  No delusions, ideations or obvious mood abnormalities.    Assessment & Plan:   1. Hyperthyroidism  - TSH  - RAI uptake & scan deferred until after May 10th due to recent  Contrasted CT scans  2. Medication management  - BASIC METABOLIC PANEL WITH GFR  - - Discussed meds/SE's & recc ROV 3 weeks.

## 2015-09-11 NOTE — Progress Notes (Deleted)
Patient ID: Michele [name], female   DOB: 1945-08-13, 71 y.o.   MRN: [id number]

## 2015-09-21 ENCOUNTER — Other Ambulatory Visit: Payer: Self-pay | Admitting: *Deleted

## 2015-09-21 DIAGNOSIS — Z0001 Encounter for general adult medical examination with abnormal findings: Secondary | ICD-10-CM

## 2015-09-21 DIAGNOSIS — Z1212 Encounter for screening for malignant neoplasm of rectum: Secondary | ICD-10-CM

## 2015-09-21 LAB — POC HEMOCCULT BLD/STL (HOME/3-CARD/SCREEN)
Card #3 Fecal Occult Blood, POC: NEGATIVE
FECAL OCCULT BLD: NEGATIVE
FECAL OCCULT BLD: NEGATIVE

## 2015-09-22 ENCOUNTER — Other Ambulatory Visit: Payer: Self-pay | Admitting: Internal Medicine

## 2015-09-23 ENCOUNTER — Encounter: Payer: Self-pay | Admitting: *Deleted

## 2015-09-28 ENCOUNTER — Other Ambulatory Visit: Payer: Self-pay

## 2015-09-28 NOTE — Patient Outreach (Signed)
[facility]) Care Management  09/28/2015  GAL CHMIEL 10-24-1945 [id number]   Telephone Screen  Referral Date: 09/27/15 Referral Source: [facility] Referral Reason: " disease and symptom management"   Outreach attempt #1 to patient. No answer at present. RN CM left HIPAA compliant voicemail message along with contact info.    Plan: RN CM will make outreach attempt to patient within a week.   Enzo Montgomery, RN,BSN,CCM [place] Management Telephonic Care Management Coordinator Direct Phone: [phone] Toll Free: [phone] Fax: [phone]

## 2015-09-29 ENCOUNTER — Other Ambulatory Visit: Payer: Self-pay

## 2015-09-29 DIAGNOSIS — I10 Essential (primary) hypertension: Secondary | ICD-10-CM

## 2015-09-29 NOTE — Patient Outreach (Addendum)
[facility] [facility]) Care Management  09/29/2015  Michele [name] 1946-04-23 [id number]   Telephone Screen  Referral Date: 09/27/15 Referral Source: [facility] Referral Reason: " disease and symptom management"   Incoming call from patient. Screening completed.   Social: Patient resides in her home along with spouse. She is independent with ADLs/IADLs. Denies any recent falls. States she does not use any DME.  Conditions: Patient has PMH: HTN (since 1973), DM, CKD stage 2, HLD, GERD, arthritis and anxiety. She also reports new issue with thyroid levels being elevated.They are unsure of what the issue is related too. Patient states that she has been having severe BP management issues. She reports that everyday all day her BP fluctuates. She reports BP readings from [id number] systolic. Patient is symptomatic at times. Blood sugars are controlled at present. She reprots cbg was 106 this am and last A1C level was around 6.5 per patient report.  Medications: Patient taking greater than 15 meds. She voices some difficulty being able to afford meds at time. She reports that she had [employer] insurance last year and just switched to [employer] this year and was not expecting her meds to cost so much more after the switch.  Appointments: She saw PCP on 09/11/15 and reports she has an appt next week. She also states that she is scheduled for further thyroid testing and scans on 10/11/15.  Consent: Patient gave verbal consent for [facility] services.   Plan: RN CM will notify [facility] administrative assistant of case status. RN CM will send [place] referral for polypharmacy med review and possible med assistance. RN CM will send [facility] community referral for further in home eval and assessment. RN CM provided patient with [facility] contact info and 24hr Nurse [facility].  Enzo Montgomery, RN,BSN,CCM [place] Management Telephonic Care Management Coordinator Direct Phone: [phone] Toll Free:  [phone] Fax: [phone]

## 2015-10-03 ENCOUNTER — Ambulatory Visit: Payer: Self-pay

## 2015-10-04 ENCOUNTER — Other Ambulatory Visit: Payer: Self-pay | Admitting: Internal Medicine

## 2015-10-04 ENCOUNTER — Ambulatory Visit (INDEPENDENT_AMBULATORY_CARE_PROVIDER_SITE_OTHER): Payer: PPO | Admitting: Internal Medicine

## 2015-10-04 ENCOUNTER — Encounter: Payer: Self-pay | Admitting: Internal Medicine

## 2015-10-04 VITALS — BP 158/78 | HR 60 | Temp 97.5°F | Resp 16 | Ht 63.25 in | Wt 145.6 lb

## 2015-10-04 DIAGNOSIS — Z79899 Other long term (current) drug therapy: Secondary | ICD-10-CM | POA: Diagnosis not present

## 2015-10-04 DIAGNOSIS — I10 Essential (primary) hypertension: Secondary | ICD-10-CM | POA: Diagnosis not present

## 2015-10-04 DIAGNOSIS — E0590 Thyrotoxicosis, unspecified without thyrotoxic crisis or storm: Secondary | ICD-10-CM

## 2015-10-04 LAB — BASIC METABOLIC PANEL WITH GFR
BUN: 11 mg/dL (ref 7–25)
CALCIUM: 9.9 mg/dL (ref 8.6–10.4)
CO2: 25 mmol/L (ref 20–31)
CREATININE: 0.74 mg/dL (ref 0.50–0.99)
Chloride: 102 mmol/L (ref 98–110)
GFR, Est Non African American: 83 mL/min (ref >=60)
Glucose, Bld: 97 mg/dL (ref 65–99)
POTASSIUM: 4.4 mmol/L (ref 3.5–5.3)
Sodium: 138 mmol/L (ref 135–146)

## 2015-10-04 LAB — CBC WITH DIFFERENTIAL/PLATELET
BASOS ABS: 0 {cells}/uL (ref 0–200)
Basophils Relative: 0 %
EOS ABS: 124 {cells}/uL (ref 15–500)
EOS PCT: 2 %
HCT: 38 % (ref 35.0–45.0)
Hemoglobin: 12.7 g/dL (ref 11.7–15.5)
LYMPHS PCT: 29 %
Lymphs Abs: 1798 cells/uL (ref 850–3900)
MCH: 28 pg (ref 27.0–33.0)
MCHC: 33.4 g/dL (ref 32.0–36.0)
MCV: 83.9 fL (ref 80.0–100.0)
MONOS PCT: 10 %
MPV: 8.9 fL (ref 7.5–12.5)
Monocytes Absolute: 620 cells/uL (ref 200–950)
Neutro Abs: 3658 cells/uL (ref 1500–7800)
Neutrophils Relative %: 59 %
PLATELETS: 170 10*3/uL (ref 140–400)
RBC: 4.53 MIL/uL (ref 3.80–5.10)
RDW: 16.2 % — AB (ref 11.0–15.0)
WBC: 6.2 10*3/uL (ref 3.8–10.8)

## 2015-10-04 LAB — HEPATIC FUNCTION PANEL
ALBUMIN: 4.3 g/dL (ref 3.6–5.1)
ALT: 30 U/L — ABNORMAL HIGH (ref 6–29)
AST: 31 U/L (ref 10–35)
Alkaline Phosphatase: 70 U/L (ref 33–130)
BILIRUBIN TOTAL: 1.1 mg/dL (ref 0.2–1.2)
Bilirubin, Direct: 0.2 mg/dL (ref <=0.2)
Indirect Bilirubin: 0.9 mg/dL (ref 0.2–1.2)
Total Protein: 6.6 g/dL (ref 6.1–8.1)

## 2015-10-04 LAB — TSH: TSH: 0.08 m[IU]/L — AB

## 2015-10-04 NOTE — Progress Notes (Signed)
Subjective:    Patient ID: Michele [name], female    DOB: 1946/05/18, 70 y.o.   MRN: [id number]  HPI   This is the 2sd post hospitalization (3/21-24/2017) for accelerated HTN and TIA with 2 equivical mid brain areas of ischemia without focal neuro sn' or sx's. Patient's BPs' were allowed to drift downward to normal for fear of ischemic steal. Other current concern is a supressed TSH since Feb 10th ranging from 0.43 to 0.06on 09/11/2015. Thyroid uptake and scan has been deferred until after May 10 due to contrasted CT's that she had in the hospital. Patient reports her BP's have continued to fluctuate at home. She denies any CP,  palpitations, tremor or diarrhea.  Medication Sig  . ACCU-CHEK SMARTVIEW test strip USE TO CHECK BLOOD GLUCOSE EVERY DAY  . ALPRAZolam (XANAX) 0.5 MG tablet   . aspirin 325 MG EC tablet Take 325 mg by mouth daily.  Marland Kitchen atenolol (TENORMIN) 100 MG tablet Take 1 tablet (100 mg total) by mouth daily.  . Biotin 1000 MCG tablet Take 1,000 mcg by mouth daily. Reported on 08/22/2015  . Blood Glucose Monitoring Suppl (ACCU-CHEK NANO SMARTVIEW) W/DEVICE KIT CHECK BLOOD GLUCOSE THREE TIMES DAILY BEFORE MEALS  . Cholecalciferol (VITAMIN D PO) Take 5,000 Units by mouth daily.   . citalopram (CELEXA) 20 MG tablet TAKE 1 TABLET EVERY DAY  . cloNIDine (CATAPRES) 0.2 MG tablet Take 1 tablet (0.2 mg total) by mouth 2 (two) times daily.  . enalapril (VASOTEC) 20 MG tablet TAKE 1 TABLET TWICE DAILY (Patient taking differently: TAKE 20 MG BY MOUTH TWICE DAILY)  . Flaxseed, Linseed, (FLAXSEED OIL PO) Take 1,000 mg by mouth daily. Reported on 08/22/2015  . Iron TABS Take 1 tablet by mouth 2 (two) times daily.   . Omega-3 Fatty Acids (FISH OIL) 1000 MG CAPS Take 1,000 mg by mouth. Reported on 08/22/2015  . Red Yeast Rice 600 MG CAPS Take 600 mg by mouth 2 (two) times daily. Reported on 08/22/2015  . rosuvastatin (CRESTOR) 20 MG tablet Take 1 tablet (20 mg total) by mouth daily at 6 PM.  . verapamil  (CALAN) 120 MG tablet TAKE 1 TABLET TWICE DAILY (Patient taking differently: TAKE 120 MG BY MOUTH  TWICE DAILY)  . furosemide (LASIX) 40 MG tablet TAKE 1 TABLET EVERY DAY (Patient not taking: Reported on 10/04/2015)  . metFORMIN (GLUCOPHAGE-XR) 500 MG 24 hr tablet TAKE 1 TABLET THREE TIMES DAILY (Patient taking differently: TAKE 500 MG BY MOUTH THREE TIMES DAILY)   No facility-administered medications prior to visit.   Allergies  Allergen Reactions  . Lipitor [Atorvastatin]     Elevates LFT's   Past Medical History  Diagnosis Date  . Hypertension   . Carpal tunnel syndrome   . Tubulovillous adenoma of colon   . GERD (gastroesophageal reflux disease)   . Hyperlipidemia   . 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 Fatty liver   . Esophageal motility disorder   . Duodenal diverticulum   . Anxiety   . Arthritis   . Type II or unspecified type diabetes mellitus without mention of complication, not stated as uncontrolled    Review of Systems  10 point systems review negative except as above.    Objective:   Physical Exam  BP 158/78 mmHg  Pulse 60  Temp(Src) 97.5 F (36.4 C)  Resp 16  Ht 5' 3.25" (1.607 m)  Wt 145 lb 9.6 oz (66.044 kg)  BMI 25.57 kg/m2 HEENT - Eac's patent. TM's Nl. EOM's full.  PERRLA. NasoOroPharynx clear. Neck - supple. Nl Thyroid. Carotids 2+ & No bruits, nodes, JVD Chest - Clear equal BS w/o Rales, rhonchi, wheezes. Cor - Nl HS. RRR w/o sig MGR. PP 1(+). No edema. MS- FROM w/o deformities. Muscle power, tone and bulk Nl. Gait Nl. Neuro - No obvious Cr N abnormalities. Sensory, motor and Cerebellar functions appear Nl w/o focal abnormalities.    Assessment & Plan:   1. Essential hypertension   2. Hyperthyroidism  - TSH nand ordered 24 hr RAI uptake & scan.   3. Medication management  - CBC with Differential/Platelet - BASIC METABOLIC PANEL WITH GFR - Hepatic function panel

## 2015-10-04 NOTE — Progress Notes (Deleted)
Patient ID: Michele [name], female   DOB: 1945-08-13, 71 y.o.   MRN: [id number]

## 2015-10-04 NOTE — Patient Instructions (Signed)
Hyperthyroidism Hyperthyroidism is when the thyroid is too active (overactive). Your thyroid is a large gland that is located in your neck. The thyroid helps to control how your body uses food (metabolism). When your thyroid is overactive, it produces too much of a hormone called thyroxine.  CAUSES Causes of hyperthyroidism may include:  Graves disease. This is when your immune system attacks the thyroid gland. This is the most common cause.  Inflammation of the thyroid gland.  Tumor in the thyroid gland or somewhere else.  Excessive use of thyroid medicines, including:  Prescription thyroid supplement.  Herbal supplements that mimic thyroid hormones.  Solid or fluid-filled lumps within your thyroid gland (thyroid nodules).  Excessive ingestion of iodine. RISK FACTORS  Being female.  Having a family history of thyroid conditions. SIGNS AND SYMPTOMS Signs and symptoms of hyperthyroidism may include:  Nervousness.  Inability to tolerate heat.  Unexplained weight loss.  Diarrhea.  Change in the texture of hair or skin.  Heart skipping beats or making extra beats.  Rapid heart rate.  Loss of menstruation.  Shaky hands.  Fatigue.  Restlessness.  Increased appetite.  Sleep problems.  Enlarged thyroid gland or nodules. DIAGNOSIS  Diagnosis of hyperthyroidism may include:  Medical history and physical exam.  Blood tests.  Ultrasound tests. TREATMENT Treatment may include:  Medicines to control your thyroid.  Surgery to remove your thyroid.  Radiation therapy. HOME CARE INSTRUCTIONS   Take medicines only as directed by your health care provider.  Do not use any tobacco products, including cigarettes, chewing tobacco, or electronic cigarettes. If you need help quitting, ask your health care provider.  Do not exercise or do physical activity until your health care provider approves.  Keep all follow-up appointments as directed by your health care  provider. This is important. SEEK MEDICAL CARE IF:  Your symptoms do not get better with treatment.  You have fever.  You are taking thyroid replacement medicine and you:  Have depression.  Feel mentally and physically slow.  Have weight gain. SEEK IMMEDIATE MEDICAL CARE IF:   You have decreased alertness or a change in your awareness.  You have abdominal pain.  You feel dizzy.  You have a rapid heartbeat.  You have an irregular heartbeat.   This information is not intended to replace advice given to you by your health care provider. Make sure you discuss any questions you have with your health care provider.   Document Released: 05/20/2005 Document Revised: 06/10/2014 Document Reviewed: 10/05/2013 Elsevier Interactive Patient Education 2016 Elsevier Inc. +++++++++++++++++++++++++++++++++++++++++++++++++++++++++  Radioiodine (I-131) Therapy for Hyperthyroidism Radioiodine (I-131) therapy for hyperthyroidism is a procedure used to treat an overactive thyroid (hyperthyroidism). The patient swallows I-131, which is a radioactive form of iodine. The I-131 destroys thyroid cells. The thyroid is a gland in the neck. It makes thyroid hormones, which control how cells throughout the body use energy. Hyperthyroidism is usually caused by Grave's disease or by growths within the thyroid (nodules). Symptoms may include:  Nervousness.  Irritability.  Problems with sleep.  Tiredness.  Fast heart rate.  Shaky hands.  Sweating.  Heat sensitivity.  Unintended weight loss.  Brittle hair.  Enlarged thyroid gland.  Menstrual changes.  Frequent stools. LET YOUR CAREGIVER KNOW ABOUT:   All allergies.  All medications that you are taking, including over-the-counter and prescription drugs, dietary supplements, vitamins, or herbal preparations.  Any previous complications from this or other procedures.  Smoking history.  Possibility of pregnancy.  History of bleeding  problems.  Any other health problems. RISKS AND COMPLICATIONS  Risks of the procedure include:  Slight pain in the area of the thyroid gland. BEFORE THE PROCEDURE  If you are a woman, you may be asked to have pregnancy testing before the procedure.  If you have been taking thyroid medicines, you will usually be asked to stop them three days before your procedure.  You will usually be asked to stop eating and drinking at midnight the day of your procedure.  You will need to plan for someone to drive you home after the procedure. PROCEDURE  You will be asked to swallow the radioactive iodine in either pill or liquid form. It can take 1-3 months for the treatment to work. The treatment is most effective at about 3-6 months after the dose of iodine has been given. In most people, a single dose of radioactive iodine resolves their hyperthyroidism, but a few people will require a second dose.  AFTER THE PROCEDURE  Because you will be giving off tiny amounts of radiation for several days, there are some special precautions you will be asked to follow for about 2-4 days after the procedure:  Avoid being around babies or pregnant women.  Do not use public bathrooms.  Flush twice after using the toilet.  Take a bath or shower every day.  Practice good hand washing.  Drink fluids normally.  Use disposable utensils, or clean your utensils separately from those of others.  Sleep alone.  Do not engage in intimate contact.  Wash your sheets, towels, and clothes each day, by themselves.  Do not make food for other people. Other precautions include:  Stopping breastfeeding.  Do not attempt to become pregnant for at least a year after you have had the procedure.  When traveling, bring a letter of explanation from your caregiver for three months. Radioactivity may trip detectors in airports or other places. Because the point of the procedure is to destroy your thyroid gland, you will  need to take thyroid hormone by mouth for the rest of your life. HOME CARE INSTRUCTIONS   Ask your caregiver when you should resume or start thyroid medications.  Take all medications exactly as directed.  Follow any prescribed diet.  Follow instructions regarding both rest and physical activity. SEEK IMMEDIATE MEDICAL CARE IF:  You have a dry mouth.  You have a sore throat.  You have neck pain.  You have a tight sensation in the throat.  You have nausea and vomiting.  You are fatigued.  You have flushing.  You have bowel changes (either diarrhea or constipation).   This information is not intended to replace advice given to you by your health care provider. Make sure you discuss any questions you have with your health care provider.   Document Released: 10/06/2008 Document Revised: 08/12/2011 Document Reviewed: 09/14/2014 Elsevier Interactive Patient Education [employer].

## 2015-10-06 ENCOUNTER — Other Ambulatory Visit: Payer: Self-pay | Admitting: Internal Medicine

## 2015-10-06 DIAGNOSIS — E0590 Thyrotoxicosis, unspecified without thyrotoxic crisis or storm: Secondary | ICD-10-CM

## 2015-10-09 ENCOUNTER — Other Ambulatory Visit: Payer: Self-pay | Admitting: *Deleted

## 2015-10-09 NOTE — Patient Outreach (Signed)
[facility]) Care Management [place] Telephone Outreach 10/09/2015  Michele [name] [birth year] [id number]  Unsuccessful telephone outreach to Michele [name], 70 y/o female referred to [place] from [facility] telephonic CM for disease management of HTN.  HIPAA compliant VM msg left for [name], asking her to return my call; detailed contact information left on VM for patient.  Plan: I will re-attempt another telephone outreach early next week or before if I do not hear back from the patient first.  Oneta Rack, RN, BSN, CCRN [facility] Care Management  [phone]

## 2015-10-10 ENCOUNTER — Other Ambulatory Visit: Payer: Self-pay | Admitting: *Deleted

## 2015-10-10 NOTE — Patient Outreach (Addendum)
[facility]) Care Management [place] Telephone Outreach 10/10/2015  Michele [name] 17-Dec-1945 [id number]  Successful telephone outreach with Michele [name], 70 y/o female referred to [place] from [facility] telephonic CM for disease management of HTN.HIPAA verified.  Michele [name] reports that she believes she is doing "better" this week, stating that she can tell her BP's have "become under better control."  Michele [name] reports that she saw her PCP last week, and her BP medications were adjusted; she stated that her BP measurements at home "are holding steady much better than they had been previously.  Michele [name] confirms that she checks her BP's regularly at home, "2 or 3 times a day."  Michele [name] reports that this morning, her BP was 152/86 and last night it was 129/72.  Michele [name] states that now that her BP's are more consistent, and now that she is feeling better, she tries not to check her BP's "too often," noting that it tends to make her anxious if her readings are higher than she "thinks they should be."  Today, we contracted that Michele [name] would record her BP readings from her home monitor for our review together when we meet at a [place] home visit.  Michele [name] also shared that she believes one of her main issues is "getting my thyroid under control," and confirms that she has an appointment for thyroid evalution at [place]. "next week."  We discussed [place] CM program/ services, and were able to schedule a home visit for later this month.  I made sure that Michele [name] had my direct phone number should she wish to contact me before our scheduled home visit.  Michele [name] denied further concerns, needs, questions or problems today.  Plan: Michele [name] will continue monitoring and recording her BP's using her home BP monitor. Michele [name] will continue taking her medications as they are prescribed and attend  all scheduled provider appointments. Michele [name] will notify her providers of any new concerns, questions, problems, or needs that arise as necessary. [place] in-home visit scheduled for later this month.  Oneta Rack, RN, BSN, [employer] [facility] Care Management  [phone]

## 2015-10-12 ENCOUNTER — Other Ambulatory Visit: Payer: Self-pay | Admitting: Pharmacist

## 2015-10-12 NOTE — Patient Outreach (Signed)
[facility]) Care Management  10/12/2015  Michele [name] 11/17/1945 [id number]  Patient was referred to [place] for concerns regarding medication cost from [place], [place].    Pharmacist placed call to patient and patient verified name and date of birth, pharmacist explained purpose of call.  Patient reports that she is not having any difficulty obtaining in her medications at this time.  She reports that some of her prescription co-pays are more since she changed [place] for the year.  Discussed that some [place] offer $0 co-pay for certain prescriptions from mail order and her current plan doesn't appear to offer that.    Patient denied any questions about her prescriptions at this time and wasn't interested in reviewing her medication list at this time.    Patient was provided with [facility] Pharmacist phone number should she be interested in discussing medications with a [facility] Pharmacist at a later date.   Plan:  Will close out [place] case and let Richarda Osmond, [place] Coordinator aware.    Karrie Meres, PharmD, [place] [phone]

## 2015-10-17 ENCOUNTER — Ambulatory Visit (INDEPENDENT_AMBULATORY_CARE_PROVIDER_SITE_OTHER): Payer: PPO | Admitting: Internal Medicine

## 2015-10-17 ENCOUNTER — Encounter: Payer: Self-pay | Admitting: Internal Medicine

## 2015-10-17 VITALS — BP 122/60 | HR 52 | Temp 98.0°F | Resp 16 | Ht 63.25 in | Wt 147.0 lb

## 2015-10-17 DIAGNOSIS — Z86010 Personal history of colonic polyps: Secondary | ICD-10-CM | POA: Diagnosis not present

## 2015-10-17 DIAGNOSIS — Z0001 Encounter for general adult medical examination with abnormal findings: Secondary | ICD-10-CM | POA: Diagnosis not present

## 2015-10-17 DIAGNOSIS — I10 Essential (primary) hypertension: Secondary | ICD-10-CM

## 2015-10-17 DIAGNOSIS — M1000 Idiopathic gout, unspecified site: Secondary | ICD-10-CM | POA: Diagnosis not present

## 2015-10-17 DIAGNOSIS — I161 Hypertensive emergency: Secondary | ICD-10-CM | POA: Diagnosis not present

## 2015-10-17 DIAGNOSIS — E1121 Type 2 diabetes mellitus with diabetic nephropathy: Secondary | ICD-10-CM | POA: Diagnosis not present

## 2015-10-17 DIAGNOSIS — E0590 Thyrotoxicosis, unspecified without thyrotoxic crisis or storm: Secondary | ICD-10-CM

## 2015-10-17 DIAGNOSIS — R6889 Other general symptoms and signs: Secondary | ICD-10-CM

## 2015-10-17 DIAGNOSIS — K219 Gastro-esophageal reflux disease without esophagitis: Secondary | ICD-10-CM

## 2015-10-17 DIAGNOSIS — Z0000 Encounter for general adult medical examination without abnormal findings: Secondary | ICD-10-CM

## 2015-10-17 DIAGNOSIS — E0800 Diabetes mellitus due to underlying condition with hyperosmolarity without nonketotic hyperglycemic-hyperosmolar coma (NKHHC): Secondary | ICD-10-CM

## 2015-10-17 DIAGNOSIS — E559 Vitamin D deficiency, unspecified: Secondary | ICD-10-CM | POA: Diagnosis not present

## 2015-10-17 DIAGNOSIS — Z79899 Other long term (current) drug therapy: Secondary | ICD-10-CM | POA: Diagnosis not present

## 2015-10-17 DIAGNOSIS — D649 Anemia, unspecified: Secondary | ICD-10-CM | POA: Diagnosis not present

## 2015-10-17 DIAGNOSIS — E782 Mixed hyperlipidemia: Secondary | ICD-10-CM

## 2015-10-17 DIAGNOSIS — Z1159 Encounter for screening for other viral diseases: Secondary | ICD-10-CM

## 2015-10-17 LAB — BASIC METABOLIC PANEL WITH GFR
BUN: 13 mg/dL (ref 7–25)
CALCIUM: 9.7 mg/dL (ref 8.6–10.4)
CO2: 26 mmol/L (ref 20–31)
CREATININE: 0.75 mg/dL (ref 0.50–0.99)
Chloride: 102 mmol/L (ref 98–110)
GFR, Est African American: >89 mL/min (ref >=60)
GFR, Est Non African American: 82 mL/min (ref >=60)
GLUCOSE: 121 mg/dL — AB (ref 65–99)
Potassium: 4.5 mmol/L (ref 3.5–5.3)
SODIUM: 138 mmol/L (ref 135–146)

## 2015-10-17 LAB — LIPID PANEL
CHOLESTEROL: 114 mg/dL — AB (ref 125–200)
HDL: 40 mg/dL — ABNORMAL LOW (ref >=46)
LDL Cholesterol: 40 mg/dL (ref <130)
TRIGLYCERIDES: 171 mg/dL — AB (ref <150)
Total CHOL/HDL Ratio: 2.9 Ratio (ref <=5.0)
VLDL: 34 mg/dL — ABNORMAL HIGH (ref <30)

## 2015-10-17 LAB — HEPATIC FUNCTION PANEL
ALT: 26 U/L (ref 6–29)
AST: 25 U/L (ref 10–35)
Albumin: 4.1 g/dL (ref 3.6–5.1)
Alkaline Phosphatase: 66 U/L (ref 33–130)
Bilirubin, Direct: 0.2 mg/dL (ref <=0.2)
Indirect Bilirubin: 0.7 mg/dL (ref 0.2–1.2)
TOTAL PROTEIN: 6.2 g/dL (ref 6.1–8.1)
Total Bilirubin: 0.9 mg/dL (ref 0.2–1.2)

## 2015-10-17 LAB — CBC WITH DIFFERENTIAL/PLATELET
BASOS ABS: 0 {cells}/uL (ref 0–200)
Basophils Relative: 0 %
EOS PCT: 2 %
Eosinophils Absolute: 146 cells/uL (ref 15–500)
HCT: 38 % (ref 35.0–45.0)
HEMOGLOBIN: 12.5 g/dL (ref 11.7–15.5)
LYMPHS ABS: 1971 {cells}/uL (ref 850–3900)
LYMPHS PCT: 27 %
MCH: 28 pg (ref 27.0–33.0)
MCHC: 32.9 g/dL (ref 32.0–36.0)
MCV: 85.2 fL (ref 80.0–100.0)
MPV: 8.5 fL (ref 7.5–12.5)
Monocytes Absolute: 657 cells/uL (ref 200–950)
Monocytes Relative: 9 %
NEUTROS PCT: 62 %
Neutro Abs: 4526 cells/uL (ref 1500–7800)
Platelets: 174 10*3/uL (ref 140–400)
RBC: 4.46 MIL/uL (ref 3.80–5.10)
RDW: 16.2 % — ABNORMAL HIGH (ref 11.0–15.0)
WBC: 7.3 10*3/uL (ref 3.8–10.8)

## 2015-10-17 LAB — HEMOGLOBIN A1C
Hgb A1c MFr Bld: 5.9 % — ABNORMAL HIGH (ref <5.7)
Mean Plasma Glucose: 123 mg/dL

## 2015-10-17 MED ORDER — ROSUVASTATIN CALCIUM 20 MG PO TABS: 20.0000 mg | ORAL_TABLET | Freq: Every day | ORAL | Status: DC

## 2015-10-17 NOTE — Progress Notes (Signed)
MEDICARE ANNUAL WELLNESS VISIT AND FOLLOW UP  Assessment:    1. Essential hypertension -continues to be very labile which may be secondary to hyperthyroidism -cont current medications as is until uptake scan -may need referral to endocrine -monitor at home -call if hypotensive or BPs over 150/90 -go to er if weakness, severe headaches, vision changes, CP, sob  2. Hypertensive emergency -cont current meds -hospitalized  3. Gastroesophageal reflux disease, esophagitis presence not specified -cont meds -avoid trigger foods  4. 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 Diabetes mellitus due to underlying condition with hyperosmolarity without coma, without long-term current use of insulin (HCC) -cont diet and exercise - Hemoglobin A1c  6. Hyperthyroidism -may need iodine ablation vs. Endocrine referral for PTU to help control BP - TSH  7. Mixed hyperlipidemia -cont meds -refill given - Lipid panel  8. COLONIC POLYPS, ADENOMATOUS, HX OF -monitor with colonoscopy  9. Vitamin D deficiency -cont Vit D supplement  10. Medication management  - CBC with Differential/Platelet - BASIC METABOLIC PANEL WITH GFR - Hepatic function panel  11. Anemia, unspecified anemia type -cont iron multivitamin -monitor cbc  12. Idiopathic gout, unspecified chronicity, unspecified site -avoid trigger foods and alcohol -cont meds  13. Need for hepatitis C screening test  - Hepatitis C antibody   Medicare wellness due next year  Over 30 minutes of exam, counseling, chart review, and critical decision making was performed  Plan:   During the course of the visit the patient was educated and counseled about appropriate screening and preventive services including:    Pneumococcal vaccine   Influenza vaccine  Td vaccine  Prevnar 13  Screening electrocardiogram  Screening mammography  Bone densitometry screening  Colorectal cancer  screening  Diabetes screening  Glaucoma screening  Nutrition counseling   Advanced directives: given info/requested copies  Conditions/risks identified: Diabetes is at goal, ACE/ARB therapy: Yes. Urinary Incontinence is not an issue: discussed non pharmacology and pharmacology options.  Fall risk: moderate- discussed PT, home fall assessment, medications.    Subjective:   Michele [name] is a 70 y.o. female who presents for Medicare Annual Wellness Visit and 3 month follow up on hypertension, prediabetes, hyperlipidemia, vitamin D def.  Date of last medicare wellness visit is unknown.   Her blood pressure has been controlled at home, today their BP is BP: 122/60 mmHg She does not workout. She denies chest pain, shortness of breath, dizziness. She notes that her blood pressure is still bouncing around.  She reports that her blood pressure will drop to 80/50.  Sometimes it will spike to 160/100.  She reports that she has had to call Dr. Melford Aase twice for blood pressure issues.    She is on cholesterol medication and denies myalgias. Her cholesterol is not at goal. The cholesterol last visit was:   Lab Results  Component Value Date   CHOL 114* 10/17/2015   HDL 40* 10/17/2015   LDLCALC 40 10/17/2015   TRIG 171* 10/17/2015   CHOLHDL 2.9 10/17/2015   She has been working on diet and exercise for prediabetes, and denies foot ulcerations, hyperglycemia, hypoglycemia , increased appetite, nausea, paresthesia of the feet, polydipsia, polyuria, visual disturbances, vomiting and weight loss. Last A1C in the office was:  Lab Results  Component Value Date   HGBA1C 5.9* 10/17/2015   Last GFR Lab Results  Component Value Date   GFRNONAA 82 10/17/2015   Lab Results  Component Value Date   GFRAA >89  10/17/2015   Patient is on Vitamin D supplement. Lab Results  Component Value Date   VD25OH 68 07/14/2015     She reports that she is going to have her thyroid uptake scan on Thursday.   She still feels shakey and also feels like she is jittery sometimes.   Medication Review Current Outpatient Prescriptions on File Prior to Visit  Medication Sig Dispense Refill  . ACCU-CHEK SMARTVIEW test strip USE TO CHECK BLOOD GLUCOSE EVERY DAY 100 each 3  . ALPRAZolam (XANAX) 0.5 MG tablet     . aspirin 325 MG EC tablet Take 325 mg by mouth daily.    Marland Kitchen atenolol (TENORMIN) 100 MG tablet Take 1 tablet (100 mg total) by mouth daily. 90 tablet 3  . Biotin 1000 MCG tablet Take 1,000 mcg by mouth daily. Reported on 08/22/2015    . Blood Glucose Monitoring Suppl (ACCU-CHEK NANO SMARTVIEW) W/DEVICE KIT CHECK BLOOD GLUCOSE THREE TIMES DAILY BEFORE MEALS 1 kit 0  . Cholecalciferol (VITAMIN D PO) Take 5,000 Units by mouth daily.     . citalopram (CELEXA) 20 MG tablet TAKE 1 TABLET EVERY DAY 90 tablet 3  . cloNIDine (CATAPRES) 0.2 MG tablet Take 1 tablet (0.2 mg total) by mouth 2 (two) times daily. 60 tablet 11  . enalapril (VASOTEC) 20 MG tablet TAKE 1 TABLET TWICE DAILY (Patient taking differently: TAKE 20 MG BY MOUTH TWICE DAILY) 180 tablet 99  . Flaxseed, Linseed, (FLAXSEED OIL PO) Take 1,000 mg by mouth daily. Reported on 08/22/2015    . furosemide (LASIX) 40 MG tablet TAKE 1 TABLET EVERY DAY 90 tablet 99  . Iron TABS Take 1 tablet by mouth 2 (two) times daily.     . Omega-3 Fatty Acids (FISH OIL) 1000 MG CAPS Take 1,000 mg by mouth. Reported on 08/22/2015    . Red Yeast Rice 600 MG CAPS Take 600 mg by mouth 2 (two) times daily. Reported on 08/22/2015    . verapamil (CALAN) 120 MG tablet TAKE 1 TABLET TWICE DAILY (Patient taking differently: TAKE 120 MG BY MOUTH  TWICE DAILY) 180 tablet 1  . metFORMIN (GLUCOPHAGE-XR) 500 MG 24 hr tablet TAKE 1 TABLET THREE TIMES DAILY (Patient taking differently: TAKE 500 MG BY MOUTH THREE TIMES DAILY) 270 tablet 3   No current facility-administered medications on file prior to visit.    Current Problems (verified) Patient Active Problem List   Diagnosis Date  Noted  . Hyperthyroidism 09/11/2015  . Diabetes mellitus ([place]) 08/23/2015  . Headache   . Hypertensive emergency   . Gout 09/22/2014  . Vitamin D deficiency 06/28/2013  . Medication management 06/28/2013  . Anemia 06/28/2013  . 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 Mixed hyperlipidemia 03/21/2008  . Essential hypertension 03/21/2008  . GERD 03/21/2008  . FATTY LIVER DISEASE 03/21/2008  . COLONIC POLYPS, ADENOMATOUS, HX OF 03/21/2008    Screening Tests Immunization History  Administered Date(s) Administered  . Influenza Split 03/25/2013  . Influenza, High Dose Seasonal PF 03/15/2014, 03/07/2015  . Pneumococcal Conjugate-13 01/26/2014  . Pneumococcal Polysaccharide-23 03/14/2011  . Td 03/25/2013  . Zoster 06/03/2005    Preventative care: Last colonoscopy: 2014 Last mammogram: 2016   [id number]  Prior vaccinations: TD or Tdap: 2014  Influenza: 2016  Pneumococcal: 2012 Prevnar13: 2014 Shingles/Zostavax: 2007  Names of Other Physician/Practitioners you currently use: 1. [facility]- here for primary care 2. Dr. Ellie Lunch, eye doctor, last visit 2017 3. Dr. Illene Bolus, dentist, last  visit 2016 Patient Care Team: [name], MD as PCP - General (Internal Medicine) Knox Royalty, RN as [place] [facility]  Past Surgical History  Procedure Laterality Date  . Appendectomy    . Tonsillectomy    . Carpal tunnel release    . Cyst removal hand     Family History  Problem Relation Age of Onset  . Heart disease Mother   . Heart disease Father   . Heart disease Brother     x2  . Diabetes Mother   . Diabetes Father   . Colon polyps Mother    Social History  Substance Use Topics  . Smoking status: Never Smoker   . Smokeless tobacco: Never Used  . Alcohol Use: No   Allergies  Allergen Reactions  . Lipitor [Atorvastatin]     Elevates LFT's    MEDICARE WELLNESS OBJECTIVES: Tobacco use: She does  not smoke.  Patient is not a former smoker. If yes, counseling given Alcohol Current alcohol use: none Diet: in general, an "unhealthy" diet Physical activity: Current Exercise Habits: Home exercise routine, Time (Minutes): 30, Frequency (Times/Week): 5, Weekly Exercise (Minutes/Week): 150, Intensity: Mild Cardiac risk factors: Cardiac Risk Factors include: advanced age (>7mn, >>18women);family history of premature cardiovascular disease;hypertension Depression/mood screen:   [facility] 10/17/2015  Decreased Interest 0  Down, Depressed, Hopeless 0  PHQ - 2 Score 0    ADLs:  In your present state of health, do you have any difficulty performing the following activities: 10/17/2015 10/04/2015  Hearing? N N  Vision? N N  Difficulty concentrating or making decisions? N N  Walking or climbing stairs? N N  Dressing or bathing? N N  Doing errands, shopping? N N  Preparing Food and eating ? N -  Using the Toilet? N -  In the past six months, have you accidently leaked urine? N -  Do you have problems with loss of bowel control? N -  Managing your Medications? N -  Managing your Finances? N -  Housekeeping or managing your Housekeeping? N -     Cognitive Testing  Alert? Yes  Normal Appearance?Yes  Oriented to person? Yes  Place? Yes   Time? Yes  Recall of three objects?  Yes  Can perform simple calculations? Yes  Displays appropriate judgment?Yes  Can read the correct time from a watch face?Yes  EOL planning: Does patient have an advance directive?: Yes Type of Advance Directive: Living will, Healthcare Power of Attorney Copy of advanced directive(s) in chart?: Yes   Objective:   Today's Vitals   10/17/15 0837  BP: 122/60  Pulse: 52  Temp: 98 F (36.7 C)  TempSrc: Temporal  Resp: 16  Height: 5' 3.25" (1.607 m)  Weight: 147 lb (66.679 kg)   Body mass index is 25.82 kg/(m^2).  General appearance: alert, no distress, WD/WN,  female HEENT: normocephalic,  sclerae anicteric, TMs pearly, nares patent, no discharge or erythema, pharynx normal Oral cavity: MMM, no lesions Neck: supple, no lymphadenopathy, no thyromegaly, no masses Heart: RRR, normal S1, S2, no murmurs Lungs: CTA bilaterally, no wheezes, rhonchi, or rales Abdomen: +bs, soft, non tender, non distended, no masses, no hepatomegaly, no splenomegaly Musculoskeletal: nontender, no swelling, no obvious deformity Extremities: no edema, no cyanosis, no clubbing Pulses: 2+ symmetric, upper and lower extremities, normal cap refill Neurological: alert, oriented x 3, CN2-12 intact, strength normal upper extremities and lower extremities, sensation normal throughout, DTRs 2+ throughout, no cerebellar signs, gait normal Psychiatric:  normal affect, behavior normal, pleasant  Breast: defer Gyn: defer Rectal: defer  Medicare Attestation I have personally reviewed: The patient's medical and social history Their use of alcohol, tobacco or illicit drugs Their current medications and supplements The patient's functional ability including ADLs,fall risks, home safety risks, cognitive, and hearing and visual impairment Diet and physical activities Evidence for depression or mood disorders  The patient's weight, height, BMI, and visual acuity have been recorded in the chart.  I have made referrals, counseling, and provided education to the patient based on review of the above and I have provided the patient with a written personalized care plan for preventive services.     Starlyn Skeans, PA-C   10/18/2015

## 2015-10-18 LAB — TSH: TSH: 0.32 m[IU]/L — AB

## 2015-10-18 LAB — HEPATITIS C ANTIBODY: HCV Ab: NEGATIVE

## 2015-10-19 ENCOUNTER — Encounter (HOSPITAL_COMMUNITY)
Admission: RE | Admit: 2015-10-19 | Discharge: 2015-10-19 | Disposition: A | Discharge status: Home / Self Care | Payer: PPO | Source: Ambulatory Visit | Attending: Internal Medicine | Admitting: Internal Medicine

## 2015-10-19 DIAGNOSIS — E0590 Thyrotoxicosis, unspecified without thyrotoxic crisis or storm: Secondary | ICD-10-CM | POA: Diagnosis not present

## 2015-10-19 MED ORDER — SODIUM IODIDE I 131 CAPSULE
6.4000 | Freq: Once | INTRAVENOUS | Status: AC | PRN
  Administered 2015-10-19: 6.4 via ORAL

## 2015-10-20 ENCOUNTER — Other Ambulatory Visit: Payer: Self-pay | Admitting: Internal Medicine

## 2015-10-20 ENCOUNTER — Encounter (HOSPITAL_COMMUNITY)
Admission: RE | Admit: 2015-10-20 | Discharge: 2015-10-20 | Disposition: A | Discharge status: Home / Self Care | Payer: PPO | Source: Ambulatory Visit | Attending: Internal Medicine | Admitting: Internal Medicine

## 2015-10-20 DIAGNOSIS — E069 Thyroiditis, unspecified: Secondary | ICD-10-CM

## 2015-10-20 DIAGNOSIS — E0590 Thyrotoxicosis, unspecified without thyrotoxic crisis or storm: Secondary | ICD-10-CM | POA: Diagnosis not present

## 2015-10-20 MED ORDER — SODIUM PERTECHNETATE TC 99M INJECTION
10.1000 | Freq: Once | INTRAVENOUS | Status: AC | PRN
  Administered 2015-10-20: 10.1 via INTRAVENOUS

## 2015-10-20 MED ORDER — PREDNISONE 10 MG (21) PO TBPK
ORAL_TABLET | ORAL | Status: DC
Start: 2015-10-20 — End: 2015-12-22

## 2015-10-24 ENCOUNTER — Encounter: Payer: Self-pay | Admitting: *Deleted

## 2015-10-24 ENCOUNTER — Other Ambulatory Visit: Payer: Self-pay | Admitting: *Deleted

## 2015-10-24 NOTE — Patient Outreach (Signed)
[facility]) Care Management  [place] Initial Home Visit 10/24/2015  Michele [name] 14-May-1946 [id number]  DEVORY MCKINZIE is an 70 y.o. female referred to [place] from [facility] telephonic CM for disease management of HTN.  [facility] written consent obtained today.  Michele [name] reports today that she is "having a shaky, weak day," stating that her BP has "already started acting up."  Michele [name] reports that she when she woke up this morning, her BP was 128/60 and she felt good; within one hour after taking her BP medication, her BP had dropped to 90/52 with a HR of 48, causing her to feel "weak and shaky."  Michele [name] reports that these BP/HR fluctuations happen "frequently," stating "at least 2 or 3 times a week."  Michele [name] reports that she has been actively following up with her PCP to address these issues, stating, "they have changed my medications around, and are trying to figure out the best medications for me to get the BP under control."   Michele [name] also reports a newly diagnosed but ongoing issue with her thyroid, which is actively being followed by her PCP as well.  Michele [name] states that "they think my thyroid might be causing some of my BP problems," and that her PCP has put her on prednisone 30 mg po QD "as a starting point," to begin follow up evaluation of her thyroid function.  Michele [name] stated that she is discouraged, as her PCP told her "it could take a long time to get the thyroid problem fixed."  Michele [name] monitors and records her BP's several times a day around the way she feels, stating that she can tell when her BP is either too high or too low.  Today, we reviewed her written record of her BP's and her overall BP range was 92-208/50-96; her HR value ranged from 48-90.  Michele [name] states that she usually "feels really weak and washed out, and swimmy-headed" when her BP's are low, and usually "gets a really bad headache" when her  BP is too high.  We thoroughly reviewed acttion plans for Michele [name] around both high- and low- blood pressure symptoms  today.  Michele [name] has a very good understanding of her overall plan of care and daily medication regimen, both of which we went over extensively today during our visit.  Michele [name] has excellent support systems in place through her family members and friends at church, and denies community resource needs at this times.   Subjective: "I'm so tired of not feeling good; I'm weak and dizzy every day, and I get tired way too easy, and feel like I have slowed down way too much.  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 Negative for fever and weight loss.  HENT: Negative.   Eyes: Negative.   Respiratory: Negative.  Negative for cough, sputum production, shortness of breath and wheezing.   Cardiovascular: Negative.  Negative for chest pain and leg swelling.       Patient reports intermittent sensation of palpitations; none reported today  Gastrointestinal: Negative.   Genitourinary: Negative.   Musculoskeletal: Negative.  Negative for myalgias and falls.  Skin: Negative.   Neurological: Positive for weakness.  Psychiatric/Behavioral: Negative.  Negative for depression. The patient is not nervous/anxious.     Physical Exam  Constitutional: She is oriented to person, place, and time. She appears well-developed and well-nourished.  Cardiovascular: Regular rhythm, normal heart sounds and intact distal pulses.   Patient has low HR today; see VS's  Respiratory: Effort normal and breath sounds normal. No respiratory distress. She has no wheezes. She has no rales.  GI: Soft. Bowel sounds are normal.  Musculoskeletal: She exhibits no edema.  Neurological: She is alert and oriented to  person, place, and time.  Skin: Skin is warm and dry.  Psychiatric: She has a normal mood and affect. Her behavior is normal. Judgment and thought content normal.    Encounter Medications:   Outpatient Encounter Prescriptions as of 10/24/2015  Medication Sig Note  . ACCU-CHEK SMARTVIEW test strip USE TO CHECK BLOOD GLUCOSE EVERY DAY   . ALPRAZolam (XANAX) 0.5 MG tablet  09/11/2015: Received from: External Pharmacy  . aspirin 325 MG EC tablet Take 325 mg by mouth daily.   Marland Kitchen atenolol (TENORMIN) 100 MG tablet Take 1 tablet (100 mg total) by mouth daily. 10/04/2015: Takes 1/2 tab 2 times daily  . Biotin 1000 MCG tablet Take 1,000 mcg by mouth daily. Reported on 08/22/2015   . Blood Glucose Monitoring Suppl (ACCU-CHEK NANO SMARTVIEW) W/DEVICE KIT CHECK BLOOD GLUCOSE THREE TIMES DAILY BEFORE MEALS   . Cholecalciferol (VITAMIN D PO) Take 5,000 Units by mouth daily.    . citalopram (CELEXA) 20 MG tablet TAKE 1 TABLET EVERY DAY 10/04/2015: Taking 1/2 tab daily  . cloNIDine (CATAPRES) 0.2 MG tablet Take 1 tablet (0.2 mg total) by mouth 2 (two) times daily.   . enalapril (VASOTEC) 20 MG tablet TAKE 1 TABLET TWICE DAILY (Patient taking differently: TAKE 20 MG BY MOUTH TWICE DAILY) 10/04/2015: Taking 1/2 tab twice a day  . Flaxseed, Linseed, (FLAXSEED OIL PO) Take 1,000 mg by mouth daily. Reported on 08/22/2015   . furosemide (LASIX) 40 MG tablet TAKE 1 TABLET EVERY DAY   . Iron TABS Take 1 tablet by mouth 2 (two) times daily.    . metFORMIN (GLUCOPHAGE-XR) 500 MG 24 hr tablet TAKE 1 TABLET THREE TIMES DAILY (Patient taking differently: TAKE 500 MG BY MOUTH THREE TIMES DAILY)   . Omega-3 Fatty Acids (FISH OIL) 1000 MG CAPS Take 1,000 mg by mouth. Reported on 08/22/2015   . predniSONE (STERAPRED UNI-PAK 21 TAB) 10 MG (21) TBPK tablet Take 1 tablet 3 / day or as directed   . Red Yeast Rice 600 MG CAPS Take 600 mg by mouth 2 (two) times daily. Reported on 08/22/2015   . rosuvastatin (CRESTOR) 20 MG tablet Take 1 tablet  (20 mg total) by mouth daily at 6 PM.   . verapamil (CALAN) 120 MG tablet TAKE 1 TABLET TWICE DAILY (Patient taking differently: TAKE 120 MG BY MOUTH  TWICE DAILY) 10/04/2015: Taking 1/2 tablet twice daily   No facility-administered encounter medications on file as of 10/24/2015.    Functional Status:   In your present state of health, do you have any difficulty performing the following activities: 10/17/2015 10/04/2015  Hearing? N N  Vision? N N  Difficulty concentrating or making decisions? N N  Walking or climbing stairs? N N  Dressing or bathing? N N  Doing errands, shopping? N N  Preparing Food and eating ? N -  Using the Toilet? N -  In the past six months, have you accidently leaked urine? N -  Do you  have problems with loss of bowel control? N -  Managing your Medications? N -  Managing your Finances? N -  Housekeeping or managing your Housekeeping? N -    Fall/Depression Screening:    PHQ 2/9 Scores 10/17/2015 09/29/2015 07/14/2015 05/03/2014 01/26/2014  PHQ - 2 Score 0 0 0 0 0    Assessment:  Michele [name] has significant daily fluctuations of her BP and HR and is intermittently symptomatic from these fluctuations.  Michele [name] is frustrated that she does not have better daily control control over her blood pressure, which prevents her from living her normal lifestyle.  Michele [name] has good support systems through her family members and friends at church.  Michele [name] takes all of her medications as they are prescribed and regularly attends provider appointments.  Michele [name]. Hew is able to verbalize an action plan for her ongoing BP and HR fluctuations, and is committed to do everything she can to return to her normal state of health.  Plan:  Mrs. Igou will continue monitoring and recording her BP and HR values each time she takes them. Mrs. Kalb will continue taking her medications as they are prescribed and will attend all provider appointments. Mrs. Schwieger will begin  recording her subjective symptoms that are associated with any BP/ HR reading, whether high or low. Mrs. Icenhour will begin recording her blood sugar levels every day. Mrs. Tortorelli will continue stopping activity and resting when she feels weak from her BP flucatuations. Mrs. Beggs will call her PCP to make a follow up appointment after her last visit. Mrs. Castor will contact her providers or [place] RN CM for any concerns, issues, problems, or questions that arise. [place] outreach planned in 2 weeks by telephone, in 4 weeks for home visit.  I appreciate the opportunity to participate in Mrs. Whitwell's care,  Oneta Rack, RN, BSN, [place] Coordinator [facility] Care Management  [phone]

## 2015-10-31 ENCOUNTER — Other Ambulatory Visit: Payer: Self-pay | Admitting: *Deleted

## 2015-10-31 MED ORDER — ROSUVASTATIN CALCIUM 20 MG PO TABS: 20.0000 mg | ORAL_TABLET | Freq: Every day | ORAL | Status: DC

## 2015-11-07 ENCOUNTER — Encounter: Payer: Self-pay | Admitting: *Deleted

## 2015-11-08 ENCOUNTER — Other Ambulatory Visit: Payer: Self-pay | Admitting: *Deleted

## 2015-11-08 NOTE — Patient Outreach (Addendum)
[facility]) Care Management [place] Telephone Outreach 11/08/2015  Michele [name] [birth year] [id number]   Michele [name] is a 70 y.o. female referred to [place] from [facility] telephonic CM for disease management of HTN.  Today, Michele [name] reports that she is feeling "half good, half bad."  Michele [name] states that she "has good days and bad days," describing that she continues to "get real tired sometimes."  Michele [name] states that she believes her BP's are "not as high and low as they have been," and reports that they have been more consistent.  Michele [name] states that her BP ranges have been between 90-190/ 50-92.  Patient states that her BP "sometimes" still decreases quickly after she has taken her BP medication.  Michele [name] also states that she is still on prednisone, which causes her to feel "shaky and unsteady."  Michele [name] reports that she will continue the prednisone until she returns to her PCP "next week," when it will be decided if she needs to continue taking it.  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  She reports that she has not had any other symptoms of too high- or too low- BP's, other than "just being tired," or "shaky," again, which she believes may be caused by the prednisone regimen she is currently on.  Michele [name] states that she has not heard any further follow up regarding her "infected thyroid," stating she will "know more" after her PCP visit next week.  Michele [name] reports that her heart rates have consistently stayed "between 54-60" on the BP medications she is currently taking.  Michele [name] verbalizes plans to discuss her medications with her PCP during their scheduled visit next week, and I encouraged her to write down her questions about her medications and current health status/ plan of care to take with her to her PCP visit next week, which she  agreed to do.  Michele [name] states that her blood sugar readings have "been okay," and reports a blood glucose value this morning of "98."  Michele [name] reports that her general range of blood sugars have been from "85 to 185," depending on what time of the day she checks it.  Plan:  Michele [name] will continue monitoring and recording her BP and HR values each time she takes them.  Michele [name] will continue taking her medications as they are prescribed and will attend all provider appointments.  Michele [name] will begin recording her subjective symptoms that are associated with any BP/ HR reading, whether high or low.  Michele [name] will continue monitoring and recording her blood sugar levels every day.  Michele [name] will continue stopping activity and resting when she feels weak from her BP flucatuations.  Michele [name] will attend her PCP visit next week and will write down her questions about her medications and plan for follow up on her thyroid infection to discuss during the visit.  Michele [name] will contact her providers or [place] RN CM for any concerns, issues, problems, or questions that arise.  [place] outreach in-home visit scheduled later this month.  Oneta Rack, RN, BSN, [employer] [facility] Care Management  [phone]

## 2015-11-16 ENCOUNTER — Ambulatory Visit (INDEPENDENT_AMBULATORY_CARE_PROVIDER_SITE_OTHER): Payer: PPO | Admitting: Internal Medicine

## 2015-11-16 ENCOUNTER — Encounter: Payer: Self-pay | Admitting: Internal Medicine

## 2015-11-16 VITALS — BP 122/62 | HR 52 | Temp 97.3°F | Resp 16 | Ht 63.25 in | Wt 148.6 lb

## 2015-11-16 DIAGNOSIS — E069 Thyroiditis, unspecified: Secondary | ICD-10-CM | POA: Diagnosis not present

## 2015-11-16 DIAGNOSIS — I10 Essential (primary) hypertension: Secondary | ICD-10-CM | POA: Diagnosis not present

## 2015-11-16 LAB — TSH: TSH: 0.52 mIU/L

## 2015-11-16 MED ORDER — MINOXIDIL 2.5 MG PO TABS: ORAL_TABLET | ORAL | Status: DC

## 2015-11-16 NOTE — Patient Instructions (Signed)
Continue to monitor BP's  2 x/day  If ankles swell , may use the lasix / fluid pill   If BP's start dropping after adding the new BP (Minoxidil), then   Can start cutting back by 1/2 with the atenolol , Enalapril & Verapamil

## 2015-11-16 NOTE — Progress Notes (Deleted)
Patient ID: Michele [name], female   DOB: 1945-08-13, 71 y.o.   MRN: [id number]

## 2015-11-16 NOTE — Progress Notes (Signed)
Subjective:    Patient ID: Michele [name], female    DOB: Dec 08, 1945, 70 y.o.   MRN: [id number]  HPI  This delightful 70 yo spry [name] recently plagued with very labile HTN exascerbated by thyroiditis recently confirmed by Nuclear Med scan was treated by RAI 131 on 10/20/2015 and returns for 1st f/u also reporting BP's still occasionally fluctuating and elevated up to 170 and also reports easy fatigability.  Denies HA's, dizziness, CP, palpitations ,orthopnea/PND, or edema (off of her Lasix). She does report mild exertional dyspnea which is felt most likely due to deconditioning.    Medication Sig  . ALPRAZolam  0.5 MG tablet   . aspirin 325 MG EC tablet Take 325 mg by mouth daily.  Marland Kitchen atenolol 100 MG tablet Take 1 tab daily.  . Biotin 1000 MCG tablet Take 1,000 mcg by mouth daily. Reported on 08/22/2015  . VITAMIN D Take 5,000 Units by mouth daily.   . citalopram (CELEXA) 20 MG tablet TAKE 1 TABLET EVERY DAY  . cloNIDine  0.2 MG tablet Take 1 tab 2   times daily.  . enalapril 20 MG tablet TAKE 1 TABLET TWICE DAILY  . FLAXSEED OIL Take 1,000 mg by mouth daily. Reported on 08/22/2015  . furosemide (LASIX) 40 MG tablet TAKE 1 TABLET EVERY DAY infreq prn  . Iron TABS Take 1 tablet by mouth 2 (two) times daily.   . Omega-3 FISH OIL 1000 MG  Take 1,000 mg by mouth. Reported on 08/22/2015  . predniSONE  10 MG Take 1 tablet 3 / day or as directed  . Red Yeast Rice 600 MG CAPS Take 600 mg by mouth 2 (two) times daily. Reported on 08/22/2015  . rosuvastatin  20 MG tablet Take 1 tablet (20 mg total) by mouth daily at 6 PM.  . verapamil  120 MG tablet TAKE 1 TABLET TWICE DAILY  . metFORMIN-XR 500 MG 24 hr tablet TAKE 1 TABLET THREE TIMES DAILY (Patient taking differently: TAKE 500 MG BY MOUTH THREE TIMES DAILY)   Allergies  Allergen Reactions  . Lipitor [Atorvastatin]     Elevates LFT's   Past Medical History  Diagnosis Date  . Hypertension   . Carpal tunnel syndrome   . Tubulovillous adenoma of  colon   . GERD (gastroesophageal reflux disease)   . Hyperlipidemia   . 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 Fatty liver   . Esophageal motility disorder   . Duodenal diverticulum   . Anxiety   . Arthritis   . Type II or unspecified type diabetes mellitus without mention of complication, not stated as uncontrolled    Past Surgical History  Procedure Laterality Date  . Appendectomy    . Tonsillectomy    . Carpal tunnel release    . Cyst removal hand     Review of Systems 10 point systems review negative except as above.    Objective:   Physical Exam  BP 122/62 mmHg  Pulse 52  Temp(Src) 97.3 F (36.3 C)  Resp 16  Ht 5' 3.25" (1.607 m)  Wt 148 lb 9.6 oz (67.405 kg)  BMI 26.10 kg/m2  In no distress. No cyanosis, icterus, clubbing  HEENT - Eac's patent. TM's Nl. EOM's full. PERRLA. NasoOroPharynx clear. Neck - supple. Nl Thyroid. Carotids 2+ & No bruits, nodes, JVD Chest - Clear equal BS w/o Rales, rhonchi, wheezes. Cor - Nl HS. RRR w/o sig MGR. PP 1(+). No edema. Abd - No palpable organomegaly, masses or tenderness. BS nl. MS-  FROM w/o deformities. Muscle power, tone and bulk Nl. Gait Nl. Neuro - No obvious Cr N abnormalities. Sensory, motor and Cerebellar functions appear Nl w/o focal abnormalities. Psyche - Mental status normal & appropriate.  No delusions, ideations or obvious mood abnormalities.    Assessment & Plan:   1. Essential hypertension  - Add low dose minoxidil to try to level out BP's and hopefully begin tapering BP meds.   - minoxidil (LONITEN) 2.5 MG tablet; Take 1 tablet daily for BP  Dispense: 90 tablet; Refill: 1  2. Thyroiditis  - TSH  Advised patient to anticipate at least monthly OV's until BP stabilizes and Thyroid status resolved. Discussed meds & SE's.  Continue close monitoring of CBG's on prednisone

## 2015-11-23 ENCOUNTER — Other Ambulatory Visit: Payer: Self-pay | Admitting: *Deleted

## 2015-11-23 NOTE — Patient Outreach (Signed)
Michele [name] [name]) Care Management  [place] Routine Home Visit 11/23/2015  Michele [name] 1945-12-04 [id number]    Michele [name] is an 70 y.o. female referred to Michele [name] from Michele [name] telephonic CM for disease management of HTN.  Today, Michele [name] continues to report that she is feeling "a little swimmy-headed," after having taken her morning BP medications.  Michele [name] states that this has been ongoing since last [place] in-home visit in May.  Michele [name] reports that she had "a good visit" with Michele [name] last week on November 16, 2015.  Michele [name] states that Michele [name] put her on a new BP medication, Minoxidil, which she has just started this week.  Michele [name] states that Michele [name] "okayed" her to begin cutting back some of her other BP medications by half, but states that she has not begun doing so yet.  Michele [name] states that she is not sure "how" to begin cutting back, and feels a little nervous about knowing when to take half doses of her medications and/ or whole doses, around her BP readings, because "they are all over the place."  Michele [name] continues to report "high blood pressure before I take my morning medicine, and then, low blood pressures after I take them."  Again, Michele [name] reports that after her morning doses of her BP meds, that she consistently and frequently feels "wiped out and swimmy headed."  Michele [name] had her AVS follow up print out form Michele [name], and together, we went through each of her prescribed BP medications.  Together, we developed a plan for Michele [name] to begin cutting back (by half dose, as directed by Michele [name]) on her morning doses of Atenolol, Enalapril, and Verapamil, without making any changes to any other medications.    The plan we developed together is:  -- Michele [name] will check her BP every morning prior to taking her morning medications -- Michele [name] will take half doses of the BP  medications Atenolol, Enalapril, and Verapamil in the morning. -- Michele [name] will check her BP throughout the day IF she experiences symptoms of high- or low- BP -- If Michele [name] does NOT experience symptoms of high- or low- BP, she will check her BP again in the evening, prior to taking her evening BP medications, and she will make a decision on whether or not to take a half or whole dose of Atenolol, Enalapril, and Verapamil  Michele [name] will take all other medications as they are prescribed.  We thoroughly discussed all of Michele [name]'s medications today.  Michele [name] is still on prednisone, at a reduced dose from last [place] in-home visit, which she continues to report causes her to feel "shaky and unsteady." Michele [name] reports that she will continue the prednisone until she returns to her PCP in July 2017.  We went over several EMMI educational materials today about HTN/ BP, and I encouraged Michele [name] to talk to Michele [name] about what her "goal BP" is, to help her manage her daily decision about dosing the medications Michele [name] has approved for her to reduce.  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  Michele [name] states that her "inflamed thyroid" has not changed, and states that Michele [name] has told her that  they will continue to monitor for progress with monthly office visits for thyroid evaluation and BP medication adjustments.  Michele [name] reports an accurate schedule of upcoming office visits with Michele [name], and states plans to attend these scheduled visits.  Michele [name] continues to report that her blood sugar readings have "been okay," and reports a blood glucose value this morning of "113." Mrs. [name] also reports that she has been weighing herself daily, but "does not always write it down."   Patient reports that her weights "stay about the same," and that her weight today was 147 lbs.  Subjective: "I'm just ready to have days where I feel normal all day long; I don't like all the up and downs I feel with my blood pressure medication and the prednisone."  Objective:    BP 100/52 mmHg  Pulse 56  Resp 16  Ht 1.6 m ('5\' 3"' )  Wt 147 lb (66.679 kg)  BMI 26.05 kg/m2  SpO2 98%   Review of Systems  Constitutional: Positive for malaise/fatigue. Negative for fever and diaphoresis.  Respiratory: Negative.  Negative for cough, shortness of breath and wheezing.   Cardiovascular: Negative.  Negative for chest pain and leg swelling.  Gastrointestinal: Negative.   Genitourinary: Negative.   Musculoskeletal: Negative.  Negative for myalgias, back pain, joint pain and falls.  Skin:       Patient has vitiligo, which she reports has been present from childbirth  Neurological: Positive for dizziness. Negative for weakness and headaches.       Patient reports occasional/ intermittent "swimmy-headedness."  States this usually occurs after she takes her morning BP medications  Psychiatric/Behavioral: Negative for depression. The patient is not nervous/anxious.     Physical Exam  Constitutional: She is oriented to person, place, and time. She appears well-developed and well-nourished.  Cardiovascular: Regular rhythm, normal heart sounds and intact distal pulses.  Bradycardia present.   Pulses:      Radial pulses are 2+ on the right side, and 2+ on the left side.       Dorsalis pedis pulses are 2+ on the right side, and 2+ on the left side.  Respiratory: Effort normal and breath sounds normal. No respiratory distress.  GI: Soft. Bowel sounds are normal.  Musculoskeletal: Normal range of motion. She exhibits no edema.  Neurological: She is alert and oriented to person, place, and time.  Skin: Skin is warm and dry.  Psychiatric: She has a normal mood and affect. Her behavior is normal.  Judgment and thought content normal.    Encounter Medications:   Outpatient Encounter Prescriptions as of 11/23/2015  Medication Sig Note  . ACCU-CHEK SMARTVIEW test strip USE TO CHECK BLOOD GLUCOSE EVERY DAY   . ALPRAZolam (XANAX) 0.5 MG tablet  09/11/2015: Received from: External Pharmacy  . aspirin 325 MG EC tablet Take 325 mg by mouth daily.   Marland Kitchen atenolol (TENORMIN) 100 MG tablet Take 1 tablet (100 mg total) by mouth daily. 10/04/2015: Takes 1/2 tab 2 times daily  . Biotin 1000 MCG tablet Take 1,000 mcg by mouth daily. Reported on 08/22/2015   . Blood Glucose Monitoring Suppl (ACCU-CHEK NANO SMARTVIEW) W/DEVICE KIT CHECK BLOOD GLUCOSE THREE TIMES DAILY BEFORE MEALS   . Cholecalciferol (VITAMIN D PO) Take 5,000 Units by mouth daily.    . citalopram (CELEXA) 20 MG tablet TAKE 1 TABLET EVERY DAY 10/04/2015: Taking 1/2 tab daily  . cloNIDine (CATAPRES) 0.2 MG tablet Take 1 tablet (0.2 mg total) by mouth 2 (two) times daily.   Marland Kitchen  enalapril (VASOTEC) 20 MG tablet TAKE 1 TABLET TWICE DAILY (Patient taking differently: TAKE 20 MG BY MOUTH TWICE DAILY) 10/04/2015: Taking 1/2 tab twice a day  . Flaxseed, Linseed, (FLAXSEED OIL PO) Take 1,000 mg by mouth daily. Reported on 08/22/2015   . furosemide (LASIX) 40 MG tablet TAKE 1 TABLET EVERY DAY   . Iron TABS Take 1 tablet by mouth 2 (two) times daily.    . metFORMIN (GLUCOPHAGE-XR) 500 MG 24 hr tablet TAKE 1 TABLET THREE TIMES DAILY (Patient taking differently: TAKE 500 MG BY MOUTH THREE TIMES DAILY) 10/24/2015: Patient is taking this medication; re-filled through new insurance plan  . minoxidil (LONITEN) 2.5 MG tablet Take 1 tablet daily for BP   . Omega-3 Fatty Acids (FISH OIL) 1000 MG CAPS Take 1,000 mg by mouth. Reported on 08/22/2015   . predniSONE (STERAPRED UNI-PAK 21 TAB) 10 MG (21) TBPK tablet Take 1 tablet 3 / day or as directed   . Red Yeast Rice 600 MG CAPS Take 600 mg by mouth 2 (two) times daily. Reported on 08/22/2015   . rosuvastatin (CRESTOR) 20 MG  tablet Take 1 tablet (20 mg total) by mouth daily at 6 PM.   . verapamil (CALAN) 120 MG tablet TAKE 1 TABLET TWICE DAILY (Patient taking differently: TAKE 120 MG BY MOUTH  TWICE DAILY) 10/04/2015: Taking 1/2 tablet twice daily   No facility-administered encounter medications on file as of 11/23/2015.    Functional Status:   In your present state of health, do you have any difficulty performing the following activities: 11/16/2015 10/24/2015  Hearing? N N  Vision? N N  Difficulty concentrating or making decisions? N N  Walking or climbing stairs? N N  Dressing or bathing? N N  Doing errands, shopping? N N  Preparing Food and eating ? - N  Using the Toilet? - N  In the past six months, have you accidently leaked urine? - N  Do you have problems with loss of bowel control? - N  Managing your Medications? - N  Managing your Finances? - N  Housekeeping or managing your Housekeeping? - N    Fall/Depression Screening:    PHQ 2/9 Scores 11/16/2015 10/24/2015 10/17/2015 09/29/2015 07/14/2015 05/03/2014 01/26/2014  PHQ - 2 Score 0 0 0 0 0 0 0    Assessment:  Mrs. Coaxum continues to experience doubt, frustration, and uncertainty around her symptoms with her blood pressure medications and prednisone regimen.  Mrs. Varnadore is nervous about making changes to her medications, although she has been been encouraged by her PCP to try to cut back on some of her BP medicatons as they are adjusted for maximum benefit.    Plan:   Mrs. Lesperance will begin adjusting her morning BP medications to half, as described in narrative above, and will decide whether or not to adjust her evening BP medication dose based on how she feels and what her evening BP readings are.  Mrs. Mcqueen will continue monitoring and recording her BP and HR values each time she takes them.  Mrs. Galentine will continue taking her medications and will attend all provider appointments.  Mrs. Kirsh will begin recording her subjective  symptoms that are associated with any BP/ HR reading, whether high or low.  Mrs. Cespedes will continue monitoring and recording her blood sugar levels every day.  Mrs. Abila will continue stopping activity and resting when she feels weak from her BP flucatuations.  Mrs. Catala will attend her PCP visit next month and  will write down her questions about her medications and ask about her "goal BP."  Mrs. Citro will contact her providers or [place] RN CM for any concerns, issues, problems, or questions that arise.  [place] telephone outreach scheduled for next week to follow up on plan to begin reducing her doses of BP medications, around directions/ instructions provided to pt. by Michele [name].  Oneta Rack, RN, BSN, [employer] [name] Care Management  [phone]

## 2015-12-01 ENCOUNTER — Other Ambulatory Visit: Payer: Self-pay | Admitting: *Deleted

## 2015-12-01 NOTE — Patient Outreach (Signed)
[place] [facility]) Care Management [place] Telephone Outreach 12/01/2015  JASNEET MCKIERNAN [birth year] [id number]  Successful telephone outreach to Johny Drilling, 70 y.o. female referred to [place] from [facility] telephonic CM for disease management of HTN.  Today, Mrs. Kerth reports that she has continued to cut back on her prednisone as instructed by her PCP at their last office visit (OV).  Mrs. Kobel at first did not seem to remember our home visit from last week, where we discussed cutting back her BP medications as instructed/ approved by Dr. Melford Aase during last OV, and then said that she HAD been taking a half dose of her medications in the mornings, the way we discussed/ planned during our [place] home visit last week.  I reminded Mrs. Boen of the specific plan we developed together last week to reduce her morning BP medications, in an effort to improve the symptoms she had been describing around extreme BP fluctuations after taking her morning doses of medications.  The plan we developed together last week:  -- [name] will check her BP every morning prior to taking her morning medications -- [name] will take half doses of the BP medications Atenolol, Enalapril, and Verapamil in the morning. -- [name] will check her BP throughout the day IF she experiences symptoms of high- or low- BP -- If [name] does NOT experience symptoms of high- or low- BP, she will check her BP again in the evening, prior to taking her evening BP medications, and she will make a decision on whether or not to take a half or whole dose of Atenolol, Enalapril, and Verapamil  Mrs. Capley stated that she had been following this plan, and stated that she "feels 100% better, and am not having to take as many naps as I was during the middle of the day."  Mrs. Klahr said that her BP's tended to stay high at night, and stating that they "can go as high as  180-190/80-90;" however, she also said that they are "usually in the 130-140/70-80 range."     Mrs. Mierzwa stated that she was continuing to take full doses of her BP medications at night, which I agreed with.  I encouraged Mrs. Boyden to continue taking her BP medications according to the plan we developed last week, around Dr. Idell Pickles instructions at last OV, since she is feeling better.  I also encouraged her to take the written recordings of her BP's with her to her next appointment with her PCP to discuss with her providers.  I also again advised Mrs. Sermon to ask for a target/ goal BP from her PCP provider, and explained that a target/ goal BP would assist her in knowing when to be concerned about her BP readings, and whether or not to cut back on the doses, at home, as okayed/ instructed by Dr. Melford Aase.  Patient agreed to do so.  Plan:   Mrs. Metheny will begin adjusting her morning BP medications to half, as described in narrative above, and will decide whether or not to adjust her evening BP medication dose based on how she feels and what her evening BP readings are.  Mrs. Azevedo will continue monitoring and recording her BP and HR values each time she takes them.  Mrs. Leaman will continue taking her medications and will attend all provider appointments.  Mrs. Davenport will begin recording her subjective symptoms that are associated with any BP/ HR reading, whether high or low.  Mrs. Adem will continue monitoring and recording her blood sugar levels every day.  Mrs. Kuenzi will continue stopping activity and resting when she feels weak from her BP flucatuations.  Mrs. Ringenberg will attend her PCP visit next month and will write down her questions about her medications and ask about her "goal BP."  Mrs. Noorani will contact her providers or [place] RN CM for any concerns, issues, problems, or questions that arise.  [place] in home visit outreach  scheduled for next month.   Oneta Rack, RN, BSN, [employer] [facility] Care Management  [phone]

## 2015-12-22 ENCOUNTER — Ambulatory Visit (INDEPENDENT_AMBULATORY_CARE_PROVIDER_SITE_OTHER): Payer: PPO | Admitting: Internal Medicine

## 2015-12-22 ENCOUNTER — Encounter: Payer: Self-pay | Admitting: Internal Medicine

## 2015-12-22 VITALS — BP 124/56 | HR 58 | Temp 98.2°F | Resp 16 | Ht 63.25 in | Wt 150.0 lb

## 2015-12-22 DIAGNOSIS — E069 Thyroiditis, unspecified: Secondary | ICD-10-CM

## 2015-12-22 LAB — TSH: TSH: 0.41 m[IU]/L

## 2015-12-22 NOTE — Progress Notes (Signed)
Assessment and Plan:   1. Thyroiditis -off prednisone currently -if still normal will stay off prednisone -Blood pressure continues to be elevated especially at nighttime -change minoxidil to supper time to see if we can get better nighttime coverage.  If this does not work we can consider potentially using a clonidine patch - TSH      HPI 70 y.o.female presents for 1 month follow up of thyroiditis which is currently being treated with prednisone 10 mg twice daily.  She reports that she ran out of the prednisone.  She has been off of it for the last week and a half.  She reports that her blood pressures have been good throughout the daytime.  She reports that during the day it is generally in the 130 . Patient reports that they have been doing well.  female is taking their medication.  They are not having difficulty with their medications.  They report no adverse reactions.  She is not having dizziness lightheadedness, CP, or palpitations.    Past Medical History  Diagnosis Date  . Hypertension   . Carpal tunnel syndrome   . Tubulovillous adenoma of colon   . GERD (gastroesophageal reflux disease)   . Hyperlipidemia   . 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 Fatty liver   . Esophageal motility disorder   . Duodenal diverticulum   . Anxiety   . Arthritis   . Type II or unspecified type diabetes mellitus without mention of complication, not stated as uncontrolled      Allergies  Allergen Reactions  . Lipitor [Atorvastatin]     Elevates LFT's      Current Outpatient Prescriptions on File Prior to Visit  Medication Sig Dispense Refill  . ACCU-CHEK SMARTVIEW test strip USE TO CHECK BLOOD GLUCOSE EVERY DAY 100 each 3  . ALPRAZolam (XANAX) 0.5 MG tablet     . aspirin 325 MG EC tablet Take 325 mg by mouth daily.    Marland Kitchen atenolol (TENORMIN) 100 MG tablet Take 1 tablet (100 mg total) by mouth daily. 90 tablet 3  . Biotin 1000 MCG tablet Take 1,000 mcg by mouth daily. Reported on 08/22/2015     . Blood Glucose Monitoring Suppl (ACCU-CHEK NANO SMARTVIEW) W/DEVICE KIT CHECK BLOOD GLUCOSE THREE TIMES DAILY BEFORE MEALS 1 kit 0  . Cholecalciferol (VITAMIN D PO) Take 5,000 Units by mouth daily.     . citalopram (CELEXA) 20 MG tablet TAKE 1 TABLET EVERY DAY 90 tablet 3  . cloNIDine (CATAPRES) 0.2 MG tablet Take 1 tablet (0.2 mg total) by mouth 2 (two) times daily. 60 tablet 11  . enalapril (VASOTEC) 20 MG tablet TAKE 1 TABLET TWICE DAILY (Patient taking differently: TAKE 20 MG BY MOUTH TWICE DAILY) 180 tablet 99  . Flaxseed, Linseed, (FLAXSEED OIL PO) Take 1,000 mg by mouth daily. Reported on 08/22/2015    . furosemide (LASIX) 40 MG tablet TAKE 1 TABLET EVERY DAY (Patient not taking: Reported on 11/23/2015) 90 tablet 99  . Iron TABS Take 1 tablet by mouth 2 (two) times daily.     . metFORMIN (GLUCOPHAGE-XR) 500 MG 24 hr tablet TAKE 1 TABLET THREE TIMES DAILY (Patient taking differently: TAKE 500 MG BY MOUTH THREE TIMES DAILY) 270 tablet 3  . minoxidil (LONITEN) 2.5 MG tablet Take 1 tablet daily for BP 90 tablet 1  . Omega-3 Fatty Acids (FISH OIL) 1000 MG CAPS Take 1,000 mg by mouth. Reported on 08/22/2015    . predniSONE (STERAPRED UNI-PAK 21 TAB) 10 MG (21)  TBPK tablet Take 1 tablet 3 / day or as directed 100 tablet 0  . Red Yeast Rice 600 MG CAPS Take 600 mg by mouth 2 (two) times daily. Reported on 08/22/2015    . rosuvastatin (CRESTOR) 20 MG tablet Take 1 tablet (20 mg total) by mouth daily at 6 PM. 90 tablet 1  . verapamil (CALAN) 120 MG tablet TAKE 1 TABLET TWICE DAILY (Patient taking differently: TAKE 120 MG BY MOUTH  TWICE DAILY) 180 tablet 1   No current facility-administered medications on file prior to visit.    ROS: all negative except above.   Physical Exam: There were no vitals filed for this visit. Ht 5' 3.25" (1.607 m) General Appearance: Well developed well nourished, non-toxic appearing in no apparent distress. Eyes: PERRLA, EOMs, conjunctiva w/ no swelling or erythema or  discharge Sinuses: No Frontal/maxillary tenderness ENT/Mouth: Ear canals clear without swelling or erythema.  TM's normal bilaterally with no retractions, bulging, or loss of landmarks.   Neck: Supple, thyroid normal, no notable JVD  Respiratory: Respiratory effort normal, Clear breath sounds anteriorly and posteriorly bilaterally without rales, rhonchi, wheezing or stridor. No retractions or accessory muscle usage. Cardio: RRR with no MRGs.   Abdomen: Soft, + BS.  Non tender, no guarding, rebound, hernias, masses.  Musculoskeletal: Full ROM, 5/5 strength, normal gait.  Skin: Warm, dry without rashes  Neuro: Awake and oriented X 3, Cranial nerves intact. Normal muscle tone, no cerebellar symptoms. Sensation intact.  Psych: normal affect, Insight and Judgment appropriate.     Starlyn Skeans, PA-C 10:24 AM [facility]

## 2015-12-28 ENCOUNTER — Encounter: Payer: Self-pay | Admitting: *Deleted

## 2015-12-28 ENCOUNTER — Other Ambulatory Visit: Payer: Self-pay | Admitting: *Deleted

## 2015-12-28 NOTE — Patient Outreach (Signed)
[facility]) Care Management  [place] CM Routine Home Visit for Michele [name] Discharge 12/28/2015  Michele [name] 11-09-1945 [id number]  Michele [name] is an 70 y.o. female referred to Michele [name] from Michele [name] for disease management of HTN.  Today, Michele [name] reports that she has completed her prednisone and states "I feel so much better."  Michele [name] also reports that she has been managing her BP medications as we discussed and planned during last Michele [name] in-home visit as instructed/ approved by Dr. Melford Aase, also reporting that she believes her BP is "under much better control."  Michele [name] reported that she attended an OV with her PCP/ NP last week, and took her record of BP readings in with her; Michele [name] stated that she forgot to ask about her target BP during the visit, but stated that she believes her BP is under better control and more consistent with the changes she has made in her BP medication, based on the improvement in how she feels every day.  Together we reviewed her BP records today, and her readings have consistently been 112-188/59-85.   We performed a medication reconciliation review today, and Michele [name] verbalized an accurate understanding of the purpose, dosing and scheduling of her medications.     Michele [name] reports that she has continued monitoring and recording her daily weights, reporting a weight today of 147 lbs.  Michele [name] has also continued monitoring and recording her blood sugars daily, reporting a blood sugar this morning of 118; upon review of Michele [name]'s daily recorded blood sugar levels, they have ranged between 86-198 since last [facility] in [facility] visit.     Michele [name] reported an accurate report of upcoming provider appointments, along with plans to attend those appointments, as she verbalizes that her thyroid is "still being monitored."   Today we discussed the need for continued Michele [name]  Community CM involvement, given that Michele [name] is feeling better and has met her [facility] CM goals.  Michele [name] agrees that she feels in better control of her health, and together we agreed that she is ready for discharge form Michele [name] program.  Michele [name] denied the need to have ongoing [facility] CM follow up telephonically.  I made sure that Michele [name] had the phone numbers for the main [facility] CM line, the 24-hour nurse line, and my direct phone line, and she agreed to cal if she wished to resume participation with Michele [name] services.   Subjective: "I am feeling so much better now that I am off prednisone and have more control of my BP.  I am finally able to get out and work again."  Objective:    BP 138/62   Pulse 67   Resp 18   Wt 147 lb (66.7 kg)   SpO2 98%   BMI 25.83 kg/m    Review of Systems  Constitutional: Negative.  Negative for fever, malaise/fatigue and weight loss.  HENT:       Patient reports occasional "fluid behind ears," which she states she was told was seasonal allergies.  Michele [name] stated that she was told to obtain OTC allergy medications when this sensation occurs.  Respiratory: Negative.  Negative for cough, sputum production, shortness of breath and wheezing.   Cardiovascular: Negative.  Negative for chest pain and leg swelling.  Gastrointestinal: Negative.  Negative for abdominal pain, nausea and vomiting.  Genitourinary: Negative.  Negative for dysuria.  Musculoskeletal: Positive  for back pain. Negative for falls, joint pain and myalgias.       Patient reports chronic, intermittent back pain; denies pain today  Skin: Negative.   Neurological: Negative.  Negative for dizziness and weakness.  Psychiatric/Behavioral: Negative.  Negative for depression. The patient is not nervous/anxious.     Physical Exam  Constitutional: She is oriented to person, place, and time. She appears well-developed and well-nourished.  Cardiovascular: Normal rate,  regular rhythm, normal heart sounds and intact distal pulses.   Respiratory: Effort normal and breath sounds normal. No respiratory distress. She has no wheezes. She has no rales.  GI: Soft. Bowel sounds are normal.  Musculoskeletal: She exhibits no edema.  Neurological: She is alert and oriented to person, place, and time.  Skin: Skin is warm and dry.  Psychiatric: She has a normal mood and affect. Her behavior is normal. Judgment and thought content normal.    Encounter Medications:   Outpatient Encounter Prescriptions as of 12/28/2015  Medication Sig Note  . ACCU-CHEK SMARTVIEW test strip USE TO CHECK BLOOD GLUCOSE EVERY DAY   . ALPRAZolam (XANAX) 0.5 MG tablet  09/11/2015: Received from: External Pharmacy  . aspirin 325 MG EC tablet Take 325 mg by mouth daily.   Marland Kitchen atenolol (TENORMIN) 100 MG tablet Take 1 tablet (100 mg total) by mouth daily. 11/23/2015: Taking 1/2 tab BID   . Biotin 1000 MCG tablet Take 1,000 mcg by mouth daily. Reported on 08/22/2015   . Blood Glucose Monitoring Suppl (ACCU-CHEK NANO SMARTVIEW) W/DEVICE KIT CHECK BLOOD GLUCOSE THREE TIMES DAILY BEFORE MEALS   . Cholecalciferol (VITAMIN D PO) Take 5,000 Units by mouth daily.    . citalopram (CELEXA) 20 MG tablet TAKE 1 TABLET EVERY DAY 12/28/2015: Pt. Reports taking 10 mg QHS   . cloNIDine (CATAPRES) 0.2 MG tablet Take 1 tablet (0.2 mg total) by mouth 2 (two) times daily.   . enalapril (VASOTEC) 20 MG tablet TAKE 1 TABLET TWICE DAILY (Patient taking differently: TAKE 20 MG BY MOUTH TWICE DAILY) 10/04/2015: Taking 1/2 tab twice a day  . Flaxseed, Linseed, (FLAXSEED OIL PO) Take 1,000 mg by mouth daily. Reported on 08/22/2015   . Iron TABS Take 1 tablet by mouth 2 (two) times daily.    . minoxidil (LONITEN) 2.5 MG tablet Take 1 tablet daily for BP   . Omega-3 Fatty Acids (FISH OIL) 1000 MG CAPS Take 1,000 mg by mouth. Reported on 08/22/2015   . Red Yeast Rice 600 MG CAPS Take 600 mg by mouth 2 (two) times daily. Reported on  08/22/2015   . rosuvastatin (CRESTOR) 20 MG tablet Take 1 tablet (20 mg total) by mouth daily at 6 PM.   . verapamil (CALAN) 120 MG tablet TAKE 1 TABLET TWICE DAILY (Patient taking differently: TAKE 120 MG BY MOUTH  TWICE DAILY) 10/04/2015: Taking 1/2 tablet twice daily  . furosemide (LASIX) 40 MG tablet TAKE 1 TABLET EVERY DAY (Patient not taking: Reported on 12/28/2015) 11/23/2015: Was told by Dr. Melford Aase not to take unless she experiences ankle swelling; patient reports "has not taken in several months."  . metFORMIN (GLUCOPHAGE-XR) 500 MG 24 hr tablet TAKE 1 TABLET THREE TIMES DAILY (Patient taking differently: TAKE 500 MG BY MOUTH THREE TIMES DAILY) 12/28/2015: Patient taking  And knows to refill through pharmacy/ PCP    No facility-administered encounter medications on file as of 12/28/2015.     Functional Status:   In your present state of health, do you have any difficulty performing the  following activities: 11/16/2015 10/24/2015  Hearing? N N  Vision? N N  Difficulty concentrating or making decisions? N N  Walking or climbing stairs? N N  Dressing or bathing? N N  Doing errands, shopping? N N  Preparing Food and eating ? - N  Using the Toilet? - N  In the past six months, have you accidently leaked urine? - N  Do you have problems with loss of bowel control? - N  Managing your Medications? - N  Managing your Finances? - N  Housekeeping or managing your Housekeeping? - N  Some recent data might be hidden    Fall/Depression Screening:    PHQ 2/9 Scores 11/16/2015 10/24/2015 10/17/2015 09/29/2015 07/14/2015 05/03/2014 01/26/2014  PHQ - 2 Score 0 0 0 0 0 0 0    Assessment:  Michele [name] is feeling much better with the changes that have been made to her medications and has met her [facility] CM goals.  Mrs. Colao does not wish to have ongoing [facility] CM follow up/involvement telephonically.  Plan:   Will discharge Mrs. Redmon from [place] program, as she has successfully met  her goals, and will make her PCP aware.  It has been a pleasure working with Mrs. Varghese,   Oneta Rack, RN, BSN, [employer] [facility] Care Management  [phone]

## 2016-01-22 ENCOUNTER — Other Ambulatory Visit: Payer: Self-pay | Admitting: *Deleted

## 2016-01-22 ENCOUNTER — Other Ambulatory Visit: Payer: PPO

## 2016-01-22 DIAGNOSIS — E039 Hypothyroidism, unspecified: Secondary | ICD-10-CM

## 2016-01-22 LAB — TSH: TSH: 0.63 m[IU]/L

## 2016-01-22 MED ORDER — ENALAPRIL MALEATE 20 MG PO TABS: 20.0000 mg | ORAL_TABLET | Freq: Two times a day (BID) | ORAL | 4 refills | Status: DC

## 2016-01-23 ENCOUNTER — Other Ambulatory Visit: Payer: Self-pay | Admitting: Internal Medicine

## 2016-01-23 DIAGNOSIS — Z1231 Encounter for screening mammogram for malignant neoplasm of breast: Secondary | ICD-10-CM

## 2016-01-25 ENCOUNTER — Other Ambulatory Visit: Payer: Self-pay | Admitting: *Deleted

## 2016-01-25 NOTE — Patient Outreach (Signed)
[facility]) Care Management [place] telephone outreach/ from patient's call to [facility] 24-hour nurse line 01/25/2016  BEVELY HACKBART [birth year] [id number]  Michele [name] is an 70 y.o. female previously active [place] for disease management of HTN.  Mrs. Tardif successfully met her [place] CM goals and was discharged from [place] on December 28, 2015.  Received notification that patient had contacted [facility] 24-hour nurse line with questions about whether or not her insurance company covered glucose monitors.  I returned patient's call and left her a HIPAA compliant voice mail message, advising her to contact her insurance company directly to obtain answers about her insurance benefits coverage.  In the voice mail message I left, I  requested that patient return my call if I can be of further assistance, and left details of my direct phone line.  Oneta Rack, RN, BSN, [employer] [facility] Care Management  [phone]

## 2016-01-26 ENCOUNTER — Ambulatory Visit: Payer: Self-pay | Admitting: Internal Medicine

## 2016-01-29 ENCOUNTER — Ambulatory Visit (INDEPENDENT_AMBULATORY_CARE_PROVIDER_SITE_OTHER): Payer: PPO | Admitting: Internal Medicine

## 2016-01-29 ENCOUNTER — Encounter: Payer: Self-pay | Admitting: Internal Medicine

## 2016-01-29 VITALS — BP 162/66 | HR 76 | Temp 97.8°F | Resp 16 | Ht 63.25 in

## 2016-01-29 DIAGNOSIS — R0989 Other specified symptoms and signs involving the circulatory and respiratory systems: Secondary | ICD-10-CM

## 2016-01-29 DIAGNOSIS — R930 Abnormal findings on diagnostic imaging of skull and head, not elsewhere classified: Secondary | ICD-10-CM | POA: Diagnosis not present

## 2016-01-29 DIAGNOSIS — I10 Essential (primary) hypertension: Secondary | ICD-10-CM | POA: Diagnosis not present

## 2016-01-29 DIAGNOSIS — R42 Dizziness and giddiness: Secondary | ICD-10-CM | POA: Diagnosis not present

## 2016-01-29 DIAGNOSIS — R300 Dysuria: Secondary | ICD-10-CM | POA: Diagnosis not present

## 2016-01-29 LAB — CBC WITH DIFFERENTIAL/PLATELET
BASOS ABS: 60 {cells}/uL (ref 0–200)
Basophils Relative: 1 %
EOS PCT: 3 %
Eosinophils Absolute: 180 cells/uL (ref 15–500)
HCT: 37.5 % (ref 35.0–45.0)
HEMOGLOBIN: 12.5 g/dL (ref 11.7–15.5)
LYMPHS ABS: 1980 {cells}/uL (ref 850–3900)
LYMPHS PCT: 33 %
MCH: 28.5 pg (ref 27.0–33.0)
MCHC: 33.3 g/dL (ref 32.0–36.0)
MCV: 85.4 fL (ref 80.0–100.0)
MONOS PCT: 11 %
MPV: 8.9 fL (ref 7.5–12.5)
Monocytes Absolute: 660 cells/uL (ref 200–950)
NEUTROS PCT: 52 %
Neutro Abs: 3120 cells/uL (ref 1500–7800)
Platelets: 188 10*3/uL (ref 140–400)
RBC: 4.39 MIL/uL (ref 3.80–5.10)
RDW: 15.1 % — AB (ref 11.0–15.0)
WBC: 6 10*3/uL (ref 3.8–10.8)

## 2016-01-29 LAB — HEPATIC FUNCTION PANEL
ALT: 18 U/L (ref 6–29)
AST: 20 U/L (ref 10–35)
Albumin: 4.1 g/dL (ref 3.6–5.1)
Alkaline Phosphatase: 47 U/L (ref 33–130)
BILIRUBIN DIRECT: 0.2 mg/dL (ref <=0.2)
Indirect Bilirubin: 0.8 mg/dL (ref 0.2–1.2)
TOTAL PROTEIN: 6.2 g/dL (ref 6.1–8.1)
Total Bilirubin: 1 mg/dL (ref 0.2–1.2)

## 2016-01-29 LAB — BASIC METABOLIC PANEL WITH GFR
BUN: 13 mg/dL (ref 7–25)
CALCIUM: 9.4 mg/dL (ref 8.6–10.4)
CO2: 27 mmol/L (ref 20–31)
CREATININE: 0.7 mg/dL (ref 0.60–0.93)
Chloride: 104 mmol/L (ref 98–110)
GFR, Est Non African American: 88 mL/min (ref >=60)
Glucose, Bld: 122 mg/dL — ABNORMAL HIGH (ref 65–99)
Potassium: 3.6 mmol/L (ref 3.5–5.3)
SODIUM: 142 mmol/L (ref 135–146)

## 2016-01-29 NOTE — Progress Notes (Signed)
Subjective:    Patient ID: Michele [name], female    DOB: Jan 22, 1946, 70 y.o.   MRN: [id number]  HPI  Patient notes that over the last 2 weeks she has been having a lot of fatigue and weakness.  She reports that she feels like her body is limp.  She reports that she has not had any recent medication changes.  She reports that her blood pressure at home has been high in the mornings but in the evenings it is back to 136/76.  She reports that she is having some difficulty with walking straight.  She reports that she walks like she is drunk.  She reports that the walking issue is the same since she was diagnosed with a stroke.  She reports that her thyroid was back in normal range on 01/22/16.  No fevers, chills, nausea or vomiting.  She reports that she is sleeping alright with xanax on board.  She reports that she is sleeping several hours during the middle of the day.  She does feel well rested in the morning but loses energy through the day.  No headaches, no vision changes.  No chest pains  Or palpitations. She is urinating well.  No pain, no frequency or urgency.  She was not sure whether she has been dehydrated or not.  She does have dry mouth. Blood sugars are around 100-120 in the mornings.    Review of Systems  Constitutional: Positive for fatigue. Negative for chills and fever.  HENT: Positive for congestion, postnasal drip and rhinorrhea. Negative for ear pain, sneezing, sore throat and trouble swallowing.   Respiratory: Negative for chest tightness, shortness of breath and wheezing.   Cardiovascular: Negative for chest pain and palpitations.  Gastrointestinal: Negative for abdominal pain, blood in stool, constipation, diarrhea, nausea and vomiting.  Genitourinary: Positive for frequency. Negative for dysuria, hematuria and urgency.  Neurological: Positive for dizziness. Negative for facial asymmetry, speech difficulty, weakness, light-headedness and headaches.  Psychiatric/Behavioral:  Negative for confusion, hallucinations and self-injury.       Objective:   Physical Exam  Constitutional: She is oriented to person, place, and time. She appears well-developed and well-nourished. No distress.  HENT:  Head: Normocephalic.  Mouth/Throat: Oropharynx is clear and moist. No oropharyngeal exudate.  Eyes: Conjunctivae are normal. No scleral icterus.  Neck: Normal range of motion. Neck supple. No JVD present. No thyromegaly present.  Cardiovascular: Normal rate, regular rhythm, normal heart sounds and intact distal pulses.  Exam reveals no gallop and no friction rub.   No murmur heard. Pulmonary/Chest: Effort normal and breath sounds normal. No respiratory distress. She has no wheezes. She has no rales. She exhibits no tenderness.  Abdominal: Soft. Bowel sounds are normal. She exhibits no distension and no mass. There is no tenderness. There is no rebound and no guarding.  Musculoskeletal: Normal range of motion.  Lymphadenopathy:    She has no cervical adenopathy.  Neurological: She is alert and oriented to person, place, and time. No cranial nerve deficit or sensory deficit. Coordination normal.  Slightly unsteady gait  Skin: Skin is warm and dry. She is not diaphoretic.  Psychiatric: She has a normal mood and affect. Her behavior is normal. Judgment and thought content normal.  Nursing note and vitals reviewed.   Vitals:   01/29/16 1415  BP: (!) 162/66  Pulse: 76  Resp: 16  Temp: 97.8 F (36.6 C)         Assessment & Plan:    1. Dizziness  and giddiness -patient continues to have difficulty with labile HTN and also issues with balance.  Imaging showed possible demylination of the pons area and cerebellar area.  Will refer to neurology.  Will also see if we can get some help from cards with labile HTN as now that thyroid is back in normal range it is still incredibly difficult to control HTN without causing hypotensive spells.    She has been evaluated by Dr.  Angelena Form in the past.   - CBC with Differential/Platelet - BASIC METABOLIC PANEL WITH GFR - Hepatic function panel - Urinalysis, Routine w reflex microscopic (not at [facility]) - Culture, Urine

## 2016-01-30 LAB — URINALYSIS, ROUTINE W REFLEX MICROSCOPIC
Bilirubin Urine: NEGATIVE
Glucose, UA: NEGATIVE
HGB URINE DIPSTICK: NEGATIVE
KETONES UR: NEGATIVE
LEUKOCYTES UA: NEGATIVE
NITRITE: NEGATIVE
PROTEIN: NEGATIVE
SPECIFIC GRAVITY, URINE: 1.009 (ref 1.001–1.035)
pH: 6.5 (ref 5.0–8.0)

## 2016-01-30 LAB — URINE CULTURE

## 2016-02-01 ENCOUNTER — Ambulatory Visit
Admission: RE | Admit: 2016-02-01 | Discharge: 2016-02-01 | Disposition: A | Discharge status: Home / Self Care | Payer: PPO | Source: Ambulatory Visit | Attending: Internal Medicine | Admitting: Internal Medicine

## 2016-02-01 DIAGNOSIS — Z1231 Encounter for screening mammogram for malignant neoplasm of breast: Secondary | ICD-10-CM | POA: Diagnosis not present

## 2016-02-16 ENCOUNTER — Encounter: Payer: Self-pay | Admitting: Neurology

## 2016-02-16 ENCOUNTER — Ambulatory Visit (INDEPENDENT_AMBULATORY_CARE_PROVIDER_SITE_OTHER): Payer: PPO | Admitting: Neurology

## 2016-02-16 VITALS — BP 151/78 | HR 56 | Ht 63.25 in | Wt 153.2 lb

## 2016-02-16 DIAGNOSIS — M609 Myositis, unspecified: Secondary | ICD-10-CM

## 2016-02-16 DIAGNOSIS — M791 Myalgia: Secondary | ICD-10-CM

## 2016-02-16 DIAGNOSIS — R42 Dizziness and giddiness: Secondary | ICD-10-CM | POA: Diagnosis not present

## 2016-02-16 DIAGNOSIS — E538 Deficiency of other specified B group vitamins: Secondary | ICD-10-CM | POA: Diagnosis not present

## 2016-02-16 DIAGNOSIS — IMO0001 Reserved for inherently not codable concepts without codable children: Secondary | ICD-10-CM | POA: Insufficient documentation

## 2016-02-16 NOTE — Progress Notes (Signed)
Reason for visit: Dizziness  Referring physician: Dr. [name] is a 70 y.o. female  History of present illness:  Michele [name] is a 70 year old right-handed white female with a history of labile hypertension. The patient was admitted to the hospital in March 2017 with significant hypertension and a generalized headache 3 days prior to the admission associated with visual changes and consisting of flashes of light. The patient was noted to have what appeared to be chronic small vessel ischemic changes that included the right pons and middle cerebellar peduncles bilaterally. MRA of the head was unremarkable, and a 2-D echocardiogram was unremarkable. The patient is on aspirin therapy. She was discovered around that time to have significant hyperthyroidism associated with thyroiditis. The patient was treated with prednisone and beta blockers, her TSH has gradually come up to the low normal range. The patient was placed on Crestor during that admission. The patient has had some dizziness since March 2017, the dizziness has gradually gotten some better associated mainly with a sensation of imbalance while up walking. The patient reports no true vertigo or nausea or vomiting. The patient is not having headaches at this time. She has a sensation of generalized fatigue and muscle weakness, she also has soreness and achiness of the muscles of the arms and legs since she was placed on Crestor. The patient denies any falls, she denies issues controlling the bowels or the bladder. The patient still has elevations in blood pressure in the morning, often in the 591 systolic range. The blood pressures are better controlled in the evening. She is quite active, she is involved in a yard maintenance business, and she cuts grass all day long. She is sent to this office for an evaluation. She reports no numbness of the extremities.  Past Medical History:  Diagnosis Date  . Anxiety   . Arthritis   .  Carpal tunnel syndrome   . Duodenal diverticulum   . Esophageal motility disorder   . Fatty liver   . GERD (gastroesophageal reflux disease)   . Hyperlipidemia   . Hypertension   . 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 Tubulovillous adenoma of colon   . Type II or unspecified type diabetes mellitus without mention of complication, not stated as uncontrolled     Past Surgical History:  Procedure Laterality Date  . APPENDECTOMY    . CARPAL TUNNEL RELEASE Right 1992  . CYST REMOVAL HAND    . TONSILLECTOMY  1952    Family History  Problem Relation Age of Onset  . Heart disease Mother   . Diabetes Mother   . Colon polyps Mother   . Heart disease Father   . Diabetes Father   . Heart attack Father   . Heart disease Brother   . Heart attack Brother   . Cerebral aneurysm Sister   . Heart disease Brother     Stent    Social history:  reports that she has never smoked. She has never used smokeless tobacco. She reports that she does not drink alcohol or use drugs.  Medications:  Prior to Admission medications   Medication Sig Start Date End Date Taking? Authorizing Provider  ACCU-CHEK SMARTVIEW test strip USE TO CHECK BLOOD GLUCOSE EVERY DAY 04/25/15  Yes [name], MD  ALPRAZolam Duanne Moron) 0.5 MG tablet  08/29/15  Yes Historical Provider, MD  aspirin 325 MG EC tablet Take 325 mg by mouth daily.   Yes Historical Provider, MD  atenolol (TENORMIN) 100 MG tablet  Take 1 tablet (100 mg total) by mouth daily. 07/14/15  Yes [name], MD  Biotin 1000 MCG tablet Take 1,000 mcg by mouth daily. Reported on 08/22/2015   Yes Historical Provider, MD  Blood Glucose Monitoring Suppl (ACCU-CHEK NANO SMARTVIEW) W/DEVICE KIT CHECK BLOOD GLUCOSE THREE TIMES DAILY BEFORE MEALS 10/28/13  Yes Vicie Mutters, PA-C  Cholecalciferol (VITAMIN D PO) Take 5,000 Units by mouth daily.    Yes Historical Provider, MD  citalopram (CELEXA) 20 MG tablet TAKE 1 TABLET EVERY DAY 05/03/15  Yes [name], MD    cloNIDine (CATAPRES) 0.2 MG tablet Take 1 tablet (0.2 mg total) by mouth 2 (two) times daily. Patient taking differently: Take 0.2 mg by mouth 2 (two) times daily. Take on extra tab for systolic BP > 034 7/42/59  Yes Reyne Dumas, MD  enalapril (VASOTEC) 20 MG tablet Take 1 tablet (20 mg total) by mouth 2 (two) times daily. 01/22/16  Yes [name], MD  Flaxseed, Linseed, (FLAXSEED OIL PO) Take 1,000 mg by mouth daily. Reported on 08/22/2015   Yes Historical Provider, MD  furosemide (LASIX) 40 MG tablet TAKE 1 TABLET EVERY DAY 03/17/14  Yes [name], MD  Iron TABS Take 1 tablet by mouth 2 (two) times daily.    Yes Historical Provider, MD  minoxidil (LONITEN) 2.5 MG tablet Take 1 tablet daily for BP 11/16/15 05/17/16 Yes [name], MD  Omega-3 Fatty Acids (FISH OIL) 1000 MG CAPS Take 1,000 mg by mouth. Reported on 08/22/2015   Yes Historical Provider, MD  Red Yeast Rice 600 MG CAPS Take 600 mg by mouth 2 (two) times daily. Reported on 08/22/2015   Yes Historical Provider, MD  rosuvastatin (CRESTOR) 20 MG tablet Take 1 tablet (20 mg total) by mouth daily at 6 PM. 10/31/15  Yes [name], MD  verapamil (CALAN) 120 MG tablet TAKE 1 TABLET TWICE DAILY Patient taking differently: TAKE 120 MG BY MOUTH  TWICE DAILY 03/17/15  Yes [name], MD  metFORMIN (GLUCOPHAGE-XR) 500 MG 24 hr tablet TAKE 1 TABLET THREE TIMES DAILY Patient taking differently: TAKE 500 MG BY MOUTH THREE TIMES DAILY 09/12/14 12/22/15  [name], MD      Allergies  Allergen Reactions  . Lipitor [Atorvastatin]     Elevates LFT's    ROS:  Out of a complete 14 system review of symptoms, the patient complains only of the following symptoms, and all other reviewed systems are negative.  Loss of vision Cough Joint pain, achy muscles Skin sensitivity Weakness, difficulty swallowing  Blood pressure (!) 151/78, pulse (!) 56, height 5' 3.25" (1.607 m), weight 153 lb 4 oz (69.5 kg).  Physical  Exam  General: The patient is alert and cooperative at the time of the examination.  Eyes: Pupils are equal, round, and reactive to light. Discs are flat bilaterally.  Neck: The neck is supple, no carotid bruits are noted.  Respiratory: The respiratory examination is clear.  Cardiovascular: The cardiovascular examination reveals a regular rate and rhythm, no obvious murmurs or rubs are noted.  Skin: Extremities are without significant edema.  Neurologic Exam  Mental status: The patient is alert and oriented x 3 at the time of the examination. The patient has apparent normal recent and remote memory, with an apparently normal attention span and concentration ability.  Cranial nerves: Facial symmetry is present. There is good sensation of the face to pinprick and soft touch bilaterally. The strength of the facial muscles and the muscles to head turning and shoulder  shrug are normal bilaterally. Speech is slightly dysarthric, not aphasic. Extraocular movements are full. Visual fields are full. The tongue is midline, and the patient has symmetric elevation of the soft palate. No obvious hearing deficits are noted.  Motor: The motor testing reveals 5 over 5 strength of all 4 extremities. Good symmetric motor tone is noted throughout.  Sensory: Sensory testing is intact to pinprick, soft touch, vibration sensation, and position sense on all 4 extremities. No evidence of extinction is noted.  Coordination: Cerebellar testing reveals good finger-nose-finger and heel-to-shin bilaterally.  Gait and station: Gait is normal. Tandem gait is minimally unsteady. Romberg is negative. No drift is seen.  Reflexes: Deep tendon reflexes are symmetric and normal bilaterally. Toes are downgoing bilaterally.   MRI brain 08/23/15:  IMPRESSION: 1. No acute infarct. 2. Nonspecific but favor chronic small vessel disease related signal abnormality in the right pons, right caudate, bilateral  cerebellar peduncles and cerebral white matter. Demyelinating disease is the main differential consideration. 3.  Negative intracranial MRA.  * MRI scan images were reviewed online. I agree with the written report.    Assessment/Plan:  1. Hypertension  2. Small vessel disease, brainstem involvement  3. Dizziness  4. Diffuse myalgias, sensation of weakness  5. Thyroiditis  The patient has a relatively complex medical history with issues with significant hypertension, hyperthyroidism, and evidence of small vessel disease by MRI of the brain. The patient has recently been placed on Crestor. Some of the dizziness and generalized fatigue may be related to the thyroid issue, the patient may be having some side effects with myalgias on the Crestor. She has improved gradually with the fatigue and dizziness, but this still remains a problem. The cerebrovascular disease does involve the middle cerebellar peduncles and right pons, this could produce chronic issues with dizziness. We will check blood work today looking for other metabolic issues that may contribute to her symptoms. The patient will continue to get treatment for her thyroid and hopefully her symptoms may gradually continue to improve over time. If the myalgias are significant, a trial off of Crestor or switching to another agent may be indicated. She will follow-up in 4 months.  Jill Alexanders MD 02/16/2016 8:44 AM  [facility] [address] [place], [place] [address]  Phone [phone] Fax [phone]

## 2016-02-19 LAB — CK: Total CK: 83 U/L (ref 24–173)

## 2016-02-19 LAB — VITAMIN B12: Vitamin B-12: 721 pg/mL (ref 211–946)

## 2016-02-19 LAB — ACETYLCHOLINE RECEPTOR, BINDING: AChR Binding Ab, Serum: <0.03 nmol/L (ref 0.00–0.24)

## 2016-03-06 ENCOUNTER — Encounter: Payer: Self-pay | Admitting: Cardiology

## 2016-03-06 ENCOUNTER — Ambulatory Visit (INDEPENDENT_AMBULATORY_CARE_PROVIDER_SITE_OTHER): Payer: PPO | Admitting: Cardiology

## 2016-03-06 VITALS — BP 146/84 | HR 65 | Ht 63.25 in | Wt 155.4 lb

## 2016-03-06 DIAGNOSIS — I10 Essential (primary) hypertension: Secondary | ICD-10-CM | POA: Diagnosis not present

## 2016-03-06 DIAGNOSIS — Z8249 Family history of ischemic heart disease and other diseases of the circulatory system: Secondary | ICD-10-CM

## 2016-03-06 NOTE — Progress Notes (Signed)
Cardiology Office Note    Date:  03/06/2016   ID:  ONIE KASPAREK, DOB 14-Oct-1945, MRN [id number]  PCP:  Alesia Richards, MD  Cardiologist:  Fransico Him, MD   No chief complaint on file.   History of Present Illness:  Michele [name] is a 70 y.o. female with a history of GERD, HTN and hyperlipidemia who presents today for evaluation for difficult to control BP.  She has a history of recent thyroiditis which is treated by her PCP.  She has had labile HTN that was exacerbated by the thyroiditis.  She was treated with prednisone for the thyroiditis and felt better but now her BP control has gotten worse.  She has some problems as well with staggaring gait and is seeing neuro.  She says that during the night her BP will get up in the 885'O systolic around 27XA and stay like that until the am.  She denies any chest pain, SOB, DOE, LE edema, palpitations or syncope. She has never smoked but has a very strong family history of CAD with her brother and dad dying of MIs in 38's and 51's.  She also has another brother with CAD.      Past Medical History:  Diagnosis Date  . Anxiety   . Arthritis   . Carpal tunnel syndrome   . Duodenal diverticulum   . Esophageal motility disorder   . Fatty liver   . GERD (gastroesophageal reflux disease)   . Hyperlipidemia   . Hypertension   . 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 Tubulovillous adenoma of colon   . Type II or unspecified type diabetes mellitus without mention of complication, not stated as uncontrolled     Past Surgical History:  Procedure Laterality Date  . APPENDECTOMY    . CARPAL TUNNEL RELEASE Right 1992  . CYST REMOVAL HAND    . TONSILLECTOMY  1952    Current Medications: Outpatient Medications Prior to Visit  Medication Sig Dispense Refill  . ACCU-CHEK SMARTVIEW test strip USE TO CHECK BLOOD GLUCOSE EVERY DAY 100 each 3  . ALPRAZolam (XANAX) 0.5 MG tablet Take 0.5 mg by mouth as directed.     Marland Kitchen aspirin 325 MG EC tablet  Take 325 mg by mouth daily.    Marland Kitchen atenolol (TENORMIN) 100 MG tablet Take 1 tablet (100 mg total) by mouth daily. (Patient taking differently: Take 100 mg by mouth 2 (two) times daily. 1 tablet at night and 1/2 tablet in the mornings) 90 tablet 3  . Biotin 1000 MCG tablet Take 1,000 mcg by mouth daily. Reported on 08/22/2015    . Blood Glucose Monitoring Suppl (ACCU-CHEK NANO SMARTVIEW) W/DEVICE KIT CHECK BLOOD GLUCOSE THREE TIMES DAILY BEFORE MEALS 1 kit 0  . Cholecalciferol (VITAMIN D PO) Take 5,000 Units by mouth daily.     . citalopram (CELEXA) 20 MG tablet TAKE 1 TABLET EVERY DAY 90 tablet 3  . cloNIDine (CATAPRES) 0.2 MG tablet Take 1 tablet (0.2 mg total) by mouth 2 (two) times daily. (Patient taking differently: Take 0.2 mg by mouth 2 (two) times daily. Take on extra tab for systolic BP > 128) 60 tablet 11  . enalapril (VASOTEC) 20 MG tablet Take 1 tablet (20 mg total) by mouth 2 (two) times daily. (Patient taking differently: Take 20 mg by mouth as directed. ) 180 tablet 4  . Flaxseed, Linseed, (FLAXSEED OIL PO) Take 1,000 mg by mouth daily. Reported on 08/22/2015    . furosemide (LASIX) 40 MG tablet TAKE  1 TABLET EVERY DAY 90 tablet 99  . Iron TABS Take 1 tablet by mouth 2 (two) times daily.     . metFORMIN (GLUCOPHAGE-XR) 500 MG 24 hr tablet TAKE 1 TABLET THREE TIMES DAILY (Patient taking differently: TAKE 500 MG BY MOUTH THREE TIMES DAILY) 270 tablet 3  . minoxidil (LONITEN) 2.5 MG tablet Take 1 tablet daily for BP 90 tablet 1  . Omega-3 Fatty Acids (FISH OIL) 1000 MG CAPS Take 1,000 mg by mouth. Reported on 08/22/2015    . Red Yeast Rice 600 MG CAPS Take 600 mg by mouth 2 (two) times daily. Reported on 08/22/2015    . rosuvastatin (CRESTOR) 20 MG tablet Take 1 tablet (20 mg total) by mouth daily at 6 PM. 90 tablet 1  . verapamil (CALAN) 120 MG tablet TAKE 1 TABLET TWICE DAILY (Patient taking differently: TAKE 120 MG BY MOUTH  TWICE DAILY) 180 tablet 1   No facility-administered medications  prior to visit.      Allergies:   Lipitor [atorvastatin]   Social History   Social History  . Marital status: Married    Spouse name: Michele [name]  . Number of children: 1  . Years of education: 57   Occupational History  . Retired [employer] Topics  . Smoking status: Never Smoker  . Smokeless tobacco: Never Used  . Alcohol use No  . Drug use: No  . Sexual activity: No     Comment: Married   Other Topics Concern  . None   Social History Narrative   Lives with husband.  Independent of ADLs. Retired but stays active with [employer] part time.   Right-handed   Caffeine: about 1 cup of coffee per day, tea and Cokes a few times per week     Family History:  The patient's family history includes Cerebral aneurysm in her sister; Colon polyps in her mother; Diabetes in her father and mother; Heart attack in her brother and father; Heart disease in her brother, brother, father, and mother.   ROS:   Please see the history of present illness.    ROS All other systems reviewed and are negative.  PAD Screen 03/06/2016  Previous PAD dx? No  Previous surgical procedure? No  Pain with walking? No  Feet/toe relief with dangling? No  Painful, non-healing ulcers? No  Extremities discolored? No       PHYSICAL EXAM:   VS:  BP (!) 146/84   Pulse 65   Ht 5' 3.25" (1.607 m)   Wt 155 lb 6.4 oz (70.5 kg)   SpO2 97%   BMI 27.31 kg/m    GEN: Well nourished, well developed, in no acute distress  HEENT: normal  Neck: no JVD, carotid bruits, or masses Cardiac: RRR; no murmurs, rubs, or gallops,no edema.  Intact distal pulses bilaterally.  Respiratory:  clear to auscultation bilaterally, normal work of breathing GI: soft, nontender, nondistended, + BS MS: no deformity or atrophy  Skin: warm and dry, no rash Neuro:  Alert and Oriented x 3, Strength and sensation are intact Psych: euthymic mood, full affect  Wt Readings from Last 3 Encounters:  03/06/16  155 lb 6.4 oz (70.5 kg)  02/16/16 153 lb 4 oz (69.5 kg)  12/28/15 147 lb (66.7 kg)      Studies/Labs Reviewed:   EKG:  EKG is not ordered today.    Recent Labs: 07/14/2015: Magnesium 2.2 01/22/2016: TSH 0.63 01/29/2016: ALT 18; BUN 13; Creat 0.70;  Hemoglobin 12.5; Platelets 188; Potassium 3.6; Sodium 142   Lipid Panel    Component Value Date/Time   CHOL 114 (L) 10/17/2015 0938   TRIG 171 (H) 10/17/2015 0938   HDL 40 (L) 10/17/2015 0938   CHOLHDL 2.9 10/17/2015 0938   VLDL 34 (H) 10/17/2015 0938   LDLCALC 40 10/17/2015 2703    Additional studies/ records that were reviewed today include:  none    ASSESSMENT:    1. Family history of early CAD   2. Essential hypertension      PLAN:  In order of problems listed above:  1. HTN - fairly well controlled on exam today.  Her goal is < 150/34mHg.  I am going to get a 24 hour BP monitor to assess her Bp throughout the day. 2. Family history of CAD at an early age.  Her brother died at 510of an MI, father died at 438of an MI and her mom has CHF.  Her sister has several aneurysms.  She is asymptomatic but  will get a nuclear stress test to rule out ischemia for risk stratification.    Medication Adjustments/Labs and Tests Ordered: Current medicines are reviewed at length with the patient today.  Concerns regarding medicines are outlined above.  Medication changes, Labs and Tests ordered today are listed in the Patient Instructions below.  Patient Instructions  Medication Instructions:  Your physician recommends that you continue on your current medications as directed. Please refer to the Current Medication list given to you today.   Labwork: None  Testing/Procedures: Dr. TRadford Paxrecommends you have a 24 hour blood pressure monitor.  Dr. TRadford Paxrecommends you have a nuclear stress test.  Follow-Up: Your physician recommends that you schedule a follow-up appointment AS NEEDED with Dr. TRadford Paxpending study results.  Any  Other Special Instructions Will Be Listed Below (If Applicable).     If you need a refill on your cardiac medications before your next appointment, please call your pharmacy.      Signed, TFransico Him MD  03/06/2016 2:01 PM    [facility] [address] [place]  [address]Phone: [phone] Fax: [phone]

## 2016-03-06 NOTE — Patient Instructions (Signed)
Medication Instructions:  Your physician recommends that you continue on your current medications as directed. Please refer to the Current Medication list given to you today.   Labwork: None  Testing/Procedures: Dr. Radford Pax recommends you have a 24 hour blood pressure monitor.  Dr. Radford Pax recommends you have a nuclear stress test.  Follow-Up: Your physician recommends that you schedule a follow-up appointment AS NEEDED with Dr. Radford Pax pending study results.  Any Other Special Instructions Will Be Listed Below (If Applicable).     If you need a refill on your cardiac medications before your next appointment, please call your pharmacy.

## 2016-03-07 ENCOUNTER — Telehealth (HOSPITAL_COMMUNITY): Payer: Self-pay | Admitting: *Deleted

## 2016-03-07 NOTE — Telephone Encounter (Signed)
Left message on voicemail per DPR in reference to upcoming appointment scheduled on 03/12/16 with detailed instructions given per Myocardial Perfusion Study Information Sheet for the test. LM to arrive 15 minutes early, and that it is imperative to arrive on time for appointment to keep from having the test rescheduled. If you need to cancel or reschedule your appointment, please call the office within 24 hours of your appointment. Failure to do so may result in a cancellation of your appointment, and a $50 no show fee. Phone number given for call back for any questions. Fariha Goto Jacqueline    

## 2016-03-11 ENCOUNTER — Other Ambulatory Visit: Payer: Self-pay | Admitting: *Deleted

## 2016-03-11 ENCOUNTER — Telehealth (HOSPITAL_COMMUNITY): Payer: Self-pay

## 2016-03-11 MED ORDER — METFORMIN HCL ER 500 MG PO TB24: 500.0000 mg | ORAL_TABLET | Freq: Three times a day (TID) | ORAL | 3 refills | Status: DC

## 2016-03-12 ENCOUNTER — Ambulatory Visit (HOSPITAL_COMMUNITY): Payer: PPO | Attending: Cardiovascular Disease

## 2016-03-12 DIAGNOSIS — Z8249 Family history of ischemic heart disease and other diseases of the circulatory system: Secondary | ICD-10-CM | POA: Insufficient documentation

## 2016-03-12 DIAGNOSIS — I10 Essential (primary) hypertension: Secondary | ICD-10-CM | POA: Diagnosis not present

## 2016-03-12 LAB — MYOCARDIAL PERFUSION IMAGING
CHL CUP RESTING HR STRESS: 59 {beats}/min
LVDIAVOL: 90 mL (ref 46–106)
LVSYSVOL: 26 mL
NUC STRESS TID: 1.01
Peak HR: 82 {beats}/min
RATE: 0.28
SDS: 2
SRS: 2
SSS: 4

## 2016-03-12 MED ORDER — TECHNETIUM TC 99M TETROFOSMIN IV KIT
11.0000 | PACK | Freq: Once | INTRAVENOUS | Status: AC | PRN
Start: 2016-03-12 — End: 2016-03-12
  Administered 2016-03-12: 11 via INTRAVENOUS
  Filled 2016-03-12: qty 11

## 2016-03-12 MED ORDER — REGADENOSON 0.4 MG/5ML IV SOLN
0.4000 mg | Freq: Once | INTRAVENOUS | Status: AC
  Administered 2016-03-12: 0.4 mg via INTRAVENOUS

## 2016-03-12 MED ORDER — TECHNETIUM TC 99M TETROFOSMIN IV KIT
31.3000 | PACK | Freq: Once | INTRAVENOUS | Status: AC | PRN
  Administered 2016-03-12: 31.3 via INTRAVENOUS
  Filled 2016-03-12: qty 32

## 2016-03-26 ENCOUNTER — Encounter: Payer: Self-pay | Admitting: Internal Medicine

## 2016-03-26 NOTE — Progress Notes (Signed)
Michele [name] & Michele [name] [name], M.D.        Michele [name]. Michele [name], P.A.-C       Starlyn Skeans, P.A.-C  [facility]                [address] [place], [place] [id number] Telephone [phone] Telefax [phone] ______________________________________________________________________     This very nice 70 y.o. [name] presents for 6 month follow up with Hypertension, Hyperlipidemia, T2_NIDDM and Vitamin D Deficiency.      Patient is treated for HTN (1978) & BP has not recently been controlled. Today's BP is 176/86 in right arm/ 162/80 in left arm.  Patient has been experiencing wide swings in BP and reports early afternoon BP may drop to 70-80/50's and the back up again in evenings and early mornings to 200+ sys range. Patient was recently seen By Dr Fransico Him and had a nuclear Stress Test that was normal & Negative.  Patient has had no complaints of any cardiac type chest pain, palpitations, dyspnea/orthopnea/PND, dizziness, claudication, or dependent edema.     Hyperlipidemia is controlled with diet & meds. Patient denies myalgias or other med SE's. 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  Last A1c was not at goal: Lab Results  Component Value Date   HGBA1C 5.9 (H) 10/17/2015      Further, the patient also has history of Vitamin D Deficiency in 2008 of "19" and supplements vitamin D without any suspected side-effects. Last vitamin D was at goal: Lab Results  Component Value Date   VD25OH 58 07/14/2015   Current Outpatient Prescriptions on File Prior to Visit  Medication Sig  . ALPRAZolam  (XANAX) 0.5 MG tablet Take 0.5 mg by mouth as directed.   Marland Kitchen aspirin 325 MG EC tablet Take 325 mg by mouth daily.  Marland Kitchen atenolol  100 MG tablet Takes 1/2 tab in AM & 1 tab in PM  . Biotin 1000 MCG tablet Take 1,000 mcg by mouth daily. Reported on 08/22/2015  . Cholecalciferol (VITAMIN D PO) Take 5,000 Units by mouth daily.   . citalopram (CELEXA) 20 MG tablet TAKE 1 TABLET EVERY DAY  . cloNIDine (CATAPRES) 0.2 MG tablet Take 1 tab 2 x daily & extra tab for systolic BP > 0000000)  . enalapril  20 MG tablet Takes 1/2 tab in AM & 1 tab in PM  . FLAXSEED OIL  Take 1,000 mg by mouth daily. Reported on 08/22/2015  . furosemide (LASIX) 40 MG tablet TAKE 1 TABLET EVERY DAY  . Iron TABS Take 1 tablet by mouth 2 (two) times daily.   . metFORMIN-XR 500 MG 24 hr tablet Take 1 tab 3 times daily.  . minoxidil (LONITEN) 2.5 MG tablet Take 1 tablet daily for BP  . Omega-3 FISH OIL 1000 MG CAPS Take 1,000 mg by mouth.   . Red Yeast Rice 600 MG CAPS Take 600 mg  times daily.   . rosuvastatin  20 MG tablet Take 1 tab daily at 6 PM.  .  verapamil  120 MG tablet Takes 1/2 tab in AM & 1 tab in PM   Allergies  Allergen Reactions  . Lipitor [Atorvastatin]     Elevates LFT's   PMHx:   Past Medical History:  Diagnosis Date  . Anxiety   . Arthritis   . Carpal tunnel syndrome   . Duodenal diverticulum   . Esophageal motility disorder   . Fatty liver   . GERD (gastroesophageal reflux disease)   . Hyperlipidemia   . Hypertension   . 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 Tubulovillous adenoma of colon   . Type II or unspecified type diabetes mellitus without mention of complication, not stated as uncontrolled    Immunization History  Administered Date(s) Administered  . Influenza Split 03/25/2013  . Influenza, High Dose Seasonal PF 03/15/2014, 03/07/2015, 03/27/2016  . Pneumococcal Conjugate-13 01/26/2014  . Pneumococcal Polysaccharide-23 03/14/2011  . Td 03/25/2013  . Zoster 06/03/2005   Past Surgical History:   Procedure Laterality Date  . APPENDECTOMY    . CARPAL TUNNEL RELEASE Right 1992  . CYST REMOVAL HAND    . TONSILLECTOMY  1952   FHx:    Reviewed / unchanged  SHx:    Reviewed / unchanged  Systems Review:  Constitutional: Denies fever, chills, wt changes, headaches, insomnia, fatigue, night sweats, change in appetite. Eyes: Denies redness, blurred vision, diplopia, discharge, itchy, watery eyes.  ENT: Denies discharge, congestion, post nasal drip, epistaxis, sore throat, earache, hearing loss, dental pain, tinnitus, vertigo, sinus pain, snoring.  CV: Denies chest pain, palpitations, irregular heartbeat, syncope, dyspnea, diaphoresis, orthopnea, PND, claudication or edema. Respiratory: denies cough, dyspnea, DOE, pleurisy, hoarseness, laryngitis, wheezing.  Gastrointestinal: Denies dysphagia, odynophagia, heartburn, reflux, water brash, abdominal pain or cramps, nausea, vomiting, bloating, diarrhea, constipation, hematemesis, melena, hematochezia  or hemorrhoids. Genitourinary: Denies dysuria, frequency, urgency, nocturia, hesitancy, discharge, hematuria or flank pain. Musculoskeletal: Denies arthralgias, myalgias, stiffness, jt. swelling, pain, limping or strain/sprain.  Skin: Denies pruritus, rash, hives, warts, acne, eczema or change in skin lesion(s). Neuro: No weakness, tremor, incoordination, spasms, paresthesia or pain. Psychiatric: Denies confusion, memory loss or sensory loss. Endo: Denies change in weight, skin or hair change.  Heme/Lymph: No excessive bleeding, bruising or enlarged lymph nodes.  Physical Exam BP (!) 176/86 Comment: 176/86 in right arm/ 162/80 in left arm  Pulse 60   Temp 97 F (36.1 C)   Resp 16   Ht 5' 3.25" (1.607 m)   Wt 155 lb 12.8 oz (70.7 kg)   BMI 27.38 kg/m   Appears well nourished and in no distress.  Eyes: PERRLA, EOMs, conjunctiva no swelling or erythema. Sinuses: No frontal/maxillary tenderness ENT/Mouth: EAC's clear, TM's nl w/o  erythema, bulging. Nares clear w/o erythema, swelling, exudates. Oropharynx clear without erythema or exudates. Oral hygiene is good. Tongue normal, non obstructing. Hearing intact.  Neck: Supple. Thyroid nl. Car 2+/2+ without bruits, nodes or JVD. Chest: Respirations nl with BS clear & equal w/o rales, rhonchi, wheezing or stridor.  Cor: Heart sounds normal w/ regular rate and rhythm without sig. murmurs, gallops, clicks, or rubs. Peripheral pulses normal and equal  without edema.  Abdomen: Soft & bowel sounds normal. Non-tender w/o guarding, rebound, hernias, masses, or organomegaly.  Lymphatics: Unremarkable.  Musculoskeletal: Full ROM all peripheral extremities, joint stability, 5/5 strength, and normal gait.  Skin: Warm, dry without exposed rashes, lesions or ecchymosis apparent.  Neuro: Cranial nerves intact, reflexes equal bilaterally. Sensory-motor testing grossly intact. Tendon reflexes grossly intact.  Pysch: Alert & oriented x 3.  Insight and judgement nl & appropriate. No ideations.  Assessment and Plan:   1. Essential hypertension  - Recommend increase Minoxidil to 2 tabs = 5 mg /day and ROV 1 week and will titrate up to 7.5 and 10 mg /day as needed . - Continue medication, monitor blood pressure at home. Continue DASH diet.  - Reminder to go to the ER if any CP, SOB, nausea, dizziness, severe HA, changes vision/speech, left arm numbness and tingling and jaw pain. - TSH  2. Mixed hyperlipidemia  - Continue diet/meds, exercise,& lifestyle modifications.  - Continue monitor periodic cholesterol/liver & renal functions  - Lipid panel - TSH  3. 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  - Monitor appropriate labs. - Hemoglobin A1c - Insulin, random  4. Vitamin D deficiency  - Continue supplementation. - VITAMIN D 25 Hydroxy   5. Medication management  - CBC with Differential/Platelet - BASIC METABOLIC PANEL WITH  GFR - Hepatic function panel - Magnesium  6. Need for prophylactic vaccination and inoculation against influenza  - Flu vaccine HIGH DOSE PF (Fluzone High dose)       Recommended regular exercise, BP monitoring, weight control, and discussed med and SE's. Recommended labs to assess and monitor clinical status. Further disposition pending results of labs. Over 30 minutes of exam, counseling, chart review was performed

## 2016-03-26 NOTE — Patient Instructions (Signed)

## 2016-03-27 ENCOUNTER — Other Ambulatory Visit: Payer: Self-pay | Admitting: *Deleted

## 2016-03-27 ENCOUNTER — Ambulatory Visit (INDEPENDENT_AMBULATORY_CARE_PROVIDER_SITE_OTHER): Payer: PPO | Admitting: Internal Medicine

## 2016-03-27 ENCOUNTER — Encounter: Payer: Self-pay | Admitting: Internal Medicine

## 2016-03-27 VITALS — BP 176/86 | HR 60 | Temp 97.0°F | Resp 16 | Ht 63.25 in | Wt 155.8 lb

## 2016-03-27 DIAGNOSIS — E782 Mixed hyperlipidemia: Secondary | ICD-10-CM | POA: Diagnosis not present

## 2016-03-27 DIAGNOSIS — E559 Vitamin D deficiency, unspecified: Secondary | ICD-10-CM | POA: Diagnosis not present

## 2016-03-27 DIAGNOSIS — Z79899 Other long term (current) drug therapy: Secondary | ICD-10-CM

## 2016-03-27 DIAGNOSIS — Z23 Encounter for immunization: Secondary | ICD-10-CM | POA: Diagnosis not present

## 2016-03-27 DIAGNOSIS — I10 Essential (primary) hypertension: Secondary | ICD-10-CM

## 2016-03-27 DIAGNOSIS — E1121 Type 2 diabetes mellitus with diabetic nephropathy: Secondary | ICD-10-CM | POA: Diagnosis not present

## 2016-03-27 LAB — CBC WITH DIFFERENTIAL/PLATELET
Basophils Absolute: 0 cells/uL (ref 0–200)
Basophils Relative: 0 %
EOS PCT: 3 %
Eosinophils Absolute: 159 cells/uL (ref 15–500)
HCT: 39.1 % (ref 35.0–45.0)
HEMOGLOBIN: 13 g/dL (ref 11.7–15.5)
LYMPHS ABS: 2014 {cells}/uL (ref 850–3900)
LYMPHS PCT: 38 %
MCH: 28.2 pg (ref 27.0–33.0)
MCHC: 33.2 g/dL (ref 32.0–36.0)
MCV: 84.8 fL (ref 80.0–100.0)
MPV: 8.6 fL (ref 7.5–12.5)
Monocytes Absolute: 530 cells/uL (ref 200–950)
Monocytes Relative: 10 %
NEUTROS PCT: 49 %
Neutro Abs: 2597 cells/uL (ref 1500–7800)
PLATELETS: 165 10*3/uL (ref 140–400)
RBC: 4.61 MIL/uL (ref 3.80–5.10)
RDW: 13.9 % (ref 11.0–15.0)
WBC: 5.3 10*3/uL (ref 3.8–10.8)

## 2016-03-27 LAB — BASIC METABOLIC PANEL WITH GFR
BUN: 12 mg/dL (ref 7–25)
CALCIUM: 9.3 mg/dL (ref 8.6–10.4)
CO2: 26 mmol/L (ref 20–31)
Chloride: 103 mmol/L (ref 98–110)
Creat: 0.79 mg/dL (ref 0.60–0.93)
GFR, EST AFRICAN AMERICAN: 88 mL/min (ref >=60)
GFR, EST NON AFRICAN AMERICAN: 76 mL/min (ref >=60)
Glucose, Bld: 176 mg/dL — ABNORMAL HIGH (ref 65–99)
Potassium: 3.8 mmol/L (ref 3.5–5.3)
Sodium: 139 mmol/L (ref 135–146)

## 2016-03-27 LAB — LIPID PANEL
CHOL/HDL RATIO: 2.7 ratio (ref <=5.0)
CHOLESTEROL: 112 mg/dL — AB (ref 125–200)
HDL: 42 mg/dL — ABNORMAL LOW (ref >=46)
LDL Cholesterol: 35 mg/dL (ref <130)
TRIGLYCERIDES: 174 mg/dL — AB (ref <150)
VLDL: 35 mg/dL — AB (ref <30)

## 2016-03-27 LAB — HEPATIC FUNCTION PANEL
ALT: 25 U/L (ref 6–29)
AST: 22 U/L (ref 10–35)
Albumin: 3.7 g/dL (ref 3.6–5.1)
Alkaline Phosphatase: 52 U/L (ref 33–130)
BILIRUBIN DIRECT: 0.2 mg/dL (ref <=0.2)
BILIRUBIN INDIRECT: 0.6 mg/dL (ref 0.2–1.2)
BILIRUBIN TOTAL: 0.8 mg/dL (ref 0.2–1.2)
Total Protein: 6 g/dL — ABNORMAL LOW (ref 6.1–8.1)

## 2016-03-27 LAB — MAGNESIUM: Magnesium: 1.6 mg/dL (ref 1.5–2.5)

## 2016-03-27 LAB — TSH: TSH: 0.81 mIU/L

## 2016-03-27 MED ORDER — ONETOUCH ULTRASOFT LANCETS MISC: 1 refills | Status: DC

## 2016-03-27 MED ORDER — ONETOUCH ULTRA SYSTEM W/DEVICE KIT: PACK | 0 refills | Status: DC

## 2016-03-27 MED ORDER — GLUCOSE BLOOD VI STRP: ORAL_STRIP | 1 refills | Status: DC

## 2016-03-28 ENCOUNTER — Ambulatory Visit (INDEPENDENT_AMBULATORY_CARE_PROVIDER_SITE_OTHER): Payer: PPO

## 2016-03-28 ENCOUNTER — Encounter: Payer: Self-pay | Admitting: *Deleted

## 2016-03-28 DIAGNOSIS — I10 Essential (primary) hypertension: Secondary | ICD-10-CM

## 2016-03-28 DIAGNOSIS — Z8249 Family history of ischemic heart disease and other diseases of the circulatory system: Secondary | ICD-10-CM

## 2016-03-28 LAB — VITAMIN D 25 HYDROXY (VIT D DEFICIENCY, FRACTURES): VIT D 25 HYDROXY: 57 ng/mL (ref 30–100)

## 2016-03-28 LAB — INSULIN, RANDOM: Insulin: 42.9 u[IU]/mL — ABNORMAL HIGH (ref 2.0–19.6)

## 2016-03-28 LAB — HEMOGLOBIN A1C
Hgb A1c MFr Bld: 6.1 % — ABNORMAL HIGH (ref <5.7)
MEAN PLASMA GLUCOSE: 128 mg/dL

## 2016-03-28 NOTE — Progress Notes (Signed)
Patient ID: Michele [name], female   DOB: Aug 20, 1945, 70 y.o.   MRN: [id number] 24 hour ambulatory blood pressure monitor applied to right arm.

## 2016-04-01 ENCOUNTER — Encounter (HOSPITAL_COMMUNITY): Payer: Self-pay

## 2016-04-01 ENCOUNTER — Emergency Department (HOSPITAL_COMMUNITY): Payer: PPO

## 2016-04-01 ENCOUNTER — Emergency Department (HOSPITAL_COMMUNITY)
Admission: EM | Admit: 2016-04-01 | Discharge: 2016-04-01 | Disposition: A | Discharge status: Home / Self Care | Payer: PPO | Attending: Emergency Medicine | Admitting: Emergency Medicine

## 2016-04-01 DIAGNOSIS — Z7984 Long term (current) use of oral hypoglycemic drugs: Secondary | ICD-10-CM | POA: Insufficient documentation

## 2016-04-01 DIAGNOSIS — R55 Syncope and collapse: Secondary | ICD-10-CM | POA: Diagnosis not present

## 2016-04-01 DIAGNOSIS — I10 Essential (primary) hypertension: Secondary | ICD-10-CM | POA: Diagnosis not present

## 2016-04-01 DIAGNOSIS — E119 Type 2 diabetes mellitus without complications: Secondary | ICD-10-CM | POA: Diagnosis not present

## 2016-04-01 DIAGNOSIS — Z7982 Long term (current) use of aspirin: Secondary | ICD-10-CM | POA: Diagnosis not present

## 2016-04-01 DIAGNOSIS — R030 Elevated blood-pressure reading, without diagnosis of hypertension: Secondary | ICD-10-CM | POA: Diagnosis not present

## 2016-04-01 DIAGNOSIS — R531 Weakness: Secondary | ICD-10-CM | POA: Diagnosis not present

## 2016-04-01 DIAGNOSIS — R404 Transient alteration of awareness: Secondary | ICD-10-CM | POA: Diagnosis not present

## 2016-04-01 LAB — BASIC METABOLIC PANEL
Anion gap: 7 (ref 5–15)
BUN: 13 mg/dL (ref 6–20)
CHLORIDE: 109 mmol/L (ref 101–111)
CO2: 26 mmol/L (ref 22–32)
CREATININE: 0.8 mg/dL (ref 0.44–1.00)
Calcium: 9.5 mg/dL (ref 8.9–10.3)
Glucose, Bld: 103 mg/dL — ABNORMAL HIGH (ref 65–99)
POTASSIUM: 4.1 mmol/L (ref 3.5–5.1)
SODIUM: 142 mmol/L (ref 135–145)

## 2016-04-01 LAB — CBC WITH DIFFERENTIAL/PLATELET
BASOS ABS: 0 10*3/uL (ref 0.0–0.1)
Basophils Relative: 0 %
EOS ABS: 0.1 10*3/uL (ref 0.0–0.7)
EOS PCT: 1 %
HCT: 38.5 % (ref 36.0–46.0)
HEMOGLOBIN: 12.8 g/dL (ref 12.0–15.0)
LYMPHS ABS: 2 10*3/uL (ref 0.7–4.0)
LYMPHS PCT: 25 %
MCH: 28 pg (ref 26.0–34.0)
MCHC: 33.2 g/dL (ref 30.0–36.0)
MCV: 84.2 fL (ref 78.0–100.0)
Monocytes Absolute: 0.7 10*3/uL (ref 0.1–1.0)
Monocytes Relative: 9 %
NEUTROS PCT: 65 %
Neutro Abs: 5.1 10*3/uL (ref 1.7–7.7)
PLATELETS: 144 10*3/uL — AB (ref 150–400)
RBC: 4.57 MIL/uL (ref 3.87–5.11)
RDW: 13.2 % (ref 11.5–15.5)
WBC: 7.9 10*3/uL (ref 4.0–10.5)

## 2016-04-01 LAB — I-STAT TROPONIN, ED: TROPONIN I, POC: 0 ng/mL (ref 0.00–0.08)

## 2016-04-01 MED ORDER — CLONIDINE HCL 0.2 MG PO TABS
0.2000 mg | ORAL_TABLET | Freq: Once | ORAL | Status: AC
  Administered 2016-04-01: 0.2 mg via ORAL
  Filled 2016-04-01: qty 1

## 2016-04-01 MED ORDER — ACETAMINOPHEN 500 MG PO TABS
500.0000 mg | ORAL_TABLET | Freq: Once | ORAL | Status: AC
  Administered 2016-04-01: 500 mg via ORAL
  Filled 2016-04-01: qty 1

## 2016-04-01 NOTE — ED Notes (Signed)
Pt stated she felt a little dizzy sitting up and a little dizzy standing but she feels this same way normally, day to day.

## 2016-04-01 NOTE — ED Triage Notes (Signed)
Pt. Was at her sisters funeral when she had a near syncopal event. She has been having trouble with her blood pressure recently, with events of hypertension and then hypotension in the evenings

## 2016-04-01 NOTE — ED Provider Notes (Signed)
[facility] DEPT Provider Note   CSN: [id number] Arrival date & time: 04/01/16  1541     History   Chief Complaint Chief Complaint  Patient presents with  . Near Syncope    HPI Michele [name] is a 70 y.o. female.  Patient presents today after feeling generally weak and lightheaded. She denies any syncopal episodes or vertigo. Symptoms have been intermittent and ongoing for the last several weeks. She denies any neurological deficits at present. Denies headache chest pain, or dyspnea. No recent traumatic injuries. She states her symptoms worsened when she was at her sister's funeral today and she did not have time to eat lunch. Reports being under a lot of stress as she has been dealing with "blood pressure issues" and has multiple family members in the hospital.   The history is provided by the patient, the spouse and medical records. No language interpreter was used.  Near Syncope  This is a recurrent problem. The current episode started 3 to 5 hours ago. The problem occurs constantly. The problem has been gradually improving. Pertinent negatives include no chest pain, no abdominal pain, no headaches and no shortness of breath. The symptoms are aggravated by stress. The symptoms are relieved by lying down. She has tried nothing for the symptoms. The treatment provided no relief.    Past Medical History:  Diagnosis Date  . Anxiety   . Arthritis   . Carpal tunnel syndrome   . Duodenal diverticulum   . Esophageal motility disorder   . Fatty liver   . GERD (gastroesophageal reflux disease)   . Hyperlipidemia   . Hypertension   . 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 Tubulovillous adenoma of colon   . Type II or unspecified type diabetes mellitus without mention of complication, not stated as uncontrolled     Patient Active Problem List   Diagnosis Date Noted  . Dizziness and giddiness 02/16/2016  . Myalgia and myositis 02/16/2016  . Hyperthyroidism 09/11/2015  . Diabetes  mellitus ([place]) 08/23/2015  . Headache   . Hypertensive emergency   . Gout 09/22/2014  . Vitamin D deficiency 06/28/2013  . Medication management 06/28/2013  . Anemia 06/28/2013  . 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 Mixed hyperlipidemia 03/21/2008  . Essential hypertension 03/21/2008  . GERD 03/21/2008  . FATTY LIVER DISEASE 03/21/2008  . COLONIC POLYPS, ADENOMATOUS, HX OF 03/21/2008    Past Surgical History:  Procedure Laterality Date  . APPENDECTOMY    . CARPAL TUNNEL RELEASE Right 1992  . CYST REMOVAL HAND    . TONSILLECTOMY  1952    OB History    No data available       Home Medications    Prior to Admission medications   Medication Sig Start Date End Date Taking? Authorizing Provider  ALPRAZolam Duanne Moron) 0.5 MG tablet Take 0.25-0.5 mg by mouth daily as needed for anxiety.  08/29/15  Yes Historical Provider, MD  aspirin 325 MG EC tablet Take 325 mg by mouth daily.   Yes Historical Provider, MD  atenolol (TENORMIN) 100 MG tablet Take 1 tablet (100 mg total) by mouth daily. Patient taking differently: Take 50-100 mg by mouth See admin instructions. 50 mg in the morning and 100 mg at bedtime 07/14/15  Yes [name], MD  Biotin 1000 MCG tablet Take 1,000 mcg by mouth daily. Reported on 08/22/2015   Yes Historical Provider, MD  Blood Glucose Monitoring Suppl (ONE TOUCH ULTRA SYSTEM KIT) w/Device KIT Check blood sugar 1  time daily-DX-E11.21 03/27/16  Yes [name], MD  Cholecalciferol (VITAMIN D PO) Take 10,000 Units by mouth daily.    Yes Historical Provider, MD  citalopram (CELEXA) 20 MG tablet TAKE 1 TABLET EVERY DAY Patient taking differently: Take 10 mg by mouth every other night 05/03/15  Yes [name], MD  cloNIDine (CATAPRES) 0.2 MG tablet Take 1 tablet (0.2 mg total) by mouth 2 (two) times daily. Patient taking differently: Take 0.2 mg by mouth 2 (two) times daily. May take one additional tablet (0.2 mg) for systolic BP > 161 0/96/04  Yes Reyne Dumas, MD  enalapril (VASOTEC) 20 MG tablet Take 1 tablet (20 mg total) by mouth 2 (two) times daily. Patient taking differently: Take 10-20 mg by mouth See admin instructions. 10 mg in the morning and 20 mg at bedtime 01/22/16  Yes [name], MD  Flaxseed, Linseed, (FLAXSEED OIL PO) Take 1,000 mg by mouth daily. Reported on 08/22/2015   Yes Historical Provider, MD  furosemide (LASIX) 40 MG tablet TAKE 1 TABLET EVERY DAY Patient taking differently: Take 40 mg by mouth once a day only if swelling is present in the legs 03/17/14  Yes [name], MD  glucose blood test strip Check blood sugar 1 time daily-DX-E11.21 03/27/16  Yes [name], MD  Iron TABS Take 325 mg by mouth 2 (two) times daily.    Yes Historical Provider, MD  Lancets [facility] ULTRASOFT) lancets Check blood sugar 1 time daily-DX-E11.21 03/27/16  Yes [name], MD  metFORMIN (GLUCOPHAGE-XR) 500 MG 24 hr tablet Take 1 tablet (500 mg total) by mouth 3 (three) times daily. Patient taking differently: Take 500 mg by mouth 4 (four) times daily.  03/11/16 09/03/17 Yes [name], MD  minoxidil (LONITEN) 2.5 MG tablet Take 1 tablet daily for BP Patient taking differently: Take 1.25 mg by mouth 2 (two) times daily.  11/16/15 05/17/16 Yes [name], MD  Omega-3 Fatty Acids (FISH OIL) 1000 MG CAPS Take 1,000 mg by mouth daily. Reported on 08/22/2015   Yes Historical Provider, MD  Red Yeast Rice 600 MG CAPS Take 600 mg by mouth daily with breakfast. Reported on 08/22/2015   Yes Historical Provider, MD  rosuvastatin (CRESTOR) 20 MG tablet Take 1 tablet (20 mg total) by mouth daily at 6 PM. Patient taking differently: Take 10 mg by mouth 2 (two) times daily.  10/31/15  Yes [name], MD  verapamil (CALAN) 120 MG tablet TAKE 1 TABLET TWICE DAILY Patient taking differently: Take 60 mg in the morning and 120 mg at bedtime 03/17/15  Yes [name], MD    Family History Family History  Problem Relation Age of  Onset  . Heart disease Mother   . Diabetes Mother   . Colon polyps Mother   . Heart disease Father   . Diabetes Father   . Heart attack Father   . Heart disease Brother   . Heart attack Brother   . Cerebral aneurysm Sister   . Heart disease Brother     Stent    Social History Social History  Substance Use Topics  . Smoking status: Never Smoker  . Smokeless tobacco: Never Used  . Alcohol use No     Allergies   Lipitor [atorvastatin]   Review of Systems Review of Systems  Constitutional: Positive for fatigue. Negative for chills and fever.  HENT: Positive for congestion.   Respiratory: Negative for cough and shortness of breath.   Cardiovascular: Positive for near-syncope. Negative for chest pain.  Gastrointestinal: Negative for abdominal pain.  Genitourinary: Negative.   Musculoskeletal: Negative.   Skin: Negative.   Neurological: Positive for light-headedness. Negative for dizziness, seizures, syncope, speech difficulty, weakness, numbness and headaches.  Hematological: Does not bruise/bleed easily.  Psychiatric/Behavioral: Negative.      Physical Exam Updated Vital Signs BP 186/74 (BP Location: Right Arm)   Pulse 65   Temp 98.5 F (36.9 C) (Oral)   Resp 18   Ht 5' 3" (1.6 m)   Wt 70.3 kg   SpO2 99%   BMI 27.46 kg/m   Physical Exam  Constitutional: She is oriented to person, place, and time. She appears well-developed and well-nourished. No distress.  HENT:  Head: Normocephalic and atraumatic.  Eyes: Conjunctivae and EOM are normal. Pupils are equal, round, and reactive to light.  Neck: Normal range of motion. Neck supple.  Cardiovascular: Normal rate, regular rhythm, normal heart sounds and intact distal pulses.  Exam reveals no gallop and no friction rub.   No murmur heard. Pulmonary/Chest: Effort normal and breath sounds normal. No respiratory distress. She has no wheezes. She has no rales.  Abdominal: Soft. Bowel sounds are normal. She exhibits no  distension. There is no tenderness. There is no rebound and no guarding.  Neurological: She is alert and oriented to person, place, and time.  Skin: Skin is warm and dry. No rash noted. She is not diaphoretic. No pallor.  Psychiatric: She has a normal mood and affect. Her behavior is normal. Judgment and thought content normal.     ED Treatments / Results  Labs (all labs ordered are listed, but only abnormal results are displayed) Labs Reviewed  BASIC METABOLIC PANEL - Abnormal; Notable for the following:       Result Value   Glucose, Bld 103 (*)    All other components within normal limits  CBC WITH DIFFERENTIAL/PLATELET - Abnormal; Notable for the following:    Platelets 144 (*)    All other components within normal limits  I-STAT TROPOININ, ED    EKG  EKG Interpretation  Date/Time:  Monday April 01 2016 15:49:45 EDT Ventricular Rate:  65 PR Interval:    QRS Duration: 100 QT Interval:  463 QTC Calculation: 482 R Axis:   -14 Text Interpretation:  Sinus rhythm No significant change since last tracing Confirmed by CARDAMA MD, PEDRO ([id number]) on 04/01/2016 3:56:42 PM       Radiology Dg Chest 2 View  Result Date: 04/01/2016 CLINICAL DATA:  Blood pressure was high, lightheaded weak headache EXAM: CHEST  2 VIEW COMPARISON:  12/08/2009 FINDINGS: No acute infiltrate, consolidation, or pleural effusion. Mild cardiomegaly with unfolding of the aorta. No pneumothorax. IMPRESSION: Mild cardiomegaly without overt failure.  No acute infiltrate. Electronically Signed   By: Kim  Fujinaga M.D.   On: 04/01/2016 18:57    Procedures Procedures (including critical care time)  Medications Ordered in ED Medications  cloNIDine (CATAPRES) tablet 0.2 mg (0.2 mg Oral Given 04/01/16 1941)  acetaminophen (TYLENOL) tablet 500 mg (500 mg Oral Given 04/01/16 1941)     Initial Impression / Assessment and Plan / ED Course  I have reviewed the triage vital signs and the nursing  notes.  Pertinent labs & imaging results that were available during my care of the patient were reviewed by me and considered in my medical decision making (see chart for details).  Clinical Course    Patient presents with generalized weakness and lightheadedness today. Reports she hash ad these symptoms for several weeks to months   and has previously had a workup for them inpatient. On review of records she was hospitalized recently for CVA and hypertensive urgency and did receive MRI and TTE. She states she was at her sister's funeral and also has multiple sick family members in the hospital and has stressed, also had eaten poorly today. No actual syncopal episode. She never had chest pain or dyspnea. EKG unremarkable without signs of arrhythmia, no acute ischemic changes. Spot troponin negative. Do not feel another is necessary as she had no true syncope, and had no symptoms of CP or SOB. Symptoms improved after rest and eating. Do not feel her symptoms are due to ACS or PE. She is breathing comfortably on room air and CXR unremarkable, no signs of pneumonia or interstitial edema. Has stable cardiomegaly. CBC unremarkable, no previous symptoms of infection and no fever here today. No signs of anemia or leukocytosis. Creatinine and electrolytes unremarkable. She has a normal neurological examination, do not suspect CVA as cause of symptoms. Denies any vertigo. She reported symptomatic improvement on re-assessment and states she is back to baseline. She was given return precautions for worsening symptoms and expressed understanding. Do not feel she needs admission as she did not have a syncopal episode and her workup has been unremarkable. She expressed understanding of return precautions and is in good condition for discharge home.  Final Clinical Impressions(s) / ED Diagnoses   Final diagnoses:  Weakness  Near syncope    New Prescriptions Discharge Medication List as of 04/01/2016  7:16 PM         , MD 04/02/16 1628    Pedro Eduardo Cardama, MD 04/04/16 0123  

## 2016-04-03 ENCOUNTER — Telehealth: Payer: Self-pay | Admitting: Cardiology

## 2016-04-03 NOTE — Telephone Encounter (Signed)
Follow Up:   Pt would like to know if her holter monitor results are ready.

## 2016-04-03 NOTE — Telephone Encounter (Signed)
Per DPR form, left patient message with results. "BP overall controlled. No change in medications." Instructed patient to call back if she has further questions or concerns.

## 2016-04-08 ENCOUNTER — Other Ambulatory Visit: Payer: Self-pay | Admitting: Cardiovascular Disease

## 2016-04-08 NOTE — Progress Notes (Signed)
[facility]                       Laverta Harnisch, M.D.        Amanda R. Collier, P.A.-C       Courtney Forcucci, P.A.-C   [facility]                [address]                [place] [address] Telephone [phone] Telefax [phone]    

## 2016-04-09 ENCOUNTER — Encounter: Payer: Self-pay | Admitting: Internal Medicine

## 2016-04-09 ENCOUNTER — Ambulatory Visit (INDEPENDENT_AMBULATORY_CARE_PROVIDER_SITE_OTHER): Payer: PPO | Admitting: Internal Medicine

## 2016-04-09 ENCOUNTER — Other Ambulatory Visit: Payer: Self-pay | Admitting: *Deleted

## 2016-04-09 VITALS — BP 166/76 | HR 76 | Temp 97.0°F | Resp 16 | Ht 63.5 in | Wt 152.8 lb

## 2016-04-09 DIAGNOSIS — R0989 Other specified symptoms and signs involving the circulatory and respiratory systems: Secondary | ICD-10-CM

## 2016-04-09 DIAGNOSIS — I10 Essential (primary) hypertension: Secondary | ICD-10-CM | POA: Diagnosis not present

## 2016-04-09 DIAGNOSIS — F411 Generalized anxiety disorder: Secondary | ICD-10-CM | POA: Diagnosis not present

## 2016-04-09 MED ORDER — CLONAZEPAM 1 MG PO TABS: ORAL_TABLET | ORAL | 0 refills | Status: DC

## 2016-04-09 MED ORDER — CITALOPRAM HYDROBROMIDE 20 MG PO TABS: ORAL_TABLET | ORAL | 3 refills | Status: DC

## 2016-04-09 MED ORDER — GLUCOSE BLOOD VI STRP: ORAL_STRIP | 1 refills | Status: DC

## 2016-04-09 MED ORDER — MINOXIDIL 2.5 MG PO TABS: ORAL_TABLET | ORAL | 1 refills | Status: DC

## 2016-04-09 NOTE — Progress Notes (Signed)
[facility]   [name], M.D.    [name]. [name], P.A.-C      Starlyn Skeans, P.A.-C  [facility]                [address] [place] [id number] Telephone [phone] Telefax [phone] Subjective:    Patient ID: Michele [name], female    DOB: [birth year], 70 y.o.   MRN: [id number]  HPI  Patient is a 70 yo [name] ewith recent very labile HTN with wide swings and is seen in 10 day f/u of adding low dose Minoxidil 2.5 mg x 1/2 tab 2 x/day.     She continues to report am BP's range 218 676 3359/74-94 , mid day BP's range Q000111Q systolic and evening BP's 1n the Q000111Q systolic range. She still taking Atenolol 100 mg, Enalapril 20 mg, and Verapamil 120 mg  at 1/2  tab in the Am and a whole tab in the evening on all 3 meds and her Clonidine 0.2 mg is taking 2 x/day and also prn BP > 180. W/U has been negative for her severe labile BP and she c/o vague weakness w/o CP, palpitation, dyspnea or dependent edema. Does have Rx lasix, but only takes prn edema.    Medication Sig  . aspirin 325 MG EC tablet Take 325 mg  daily.  Marland Kitchen atenolol  100 MG tablet Take 50 mg in the morning and 100 mg at bedtime  . Biotin 1000 MCG tablet Take 1,000 mcg  daily  . VITAMIN D Take 10,000 Units  daily.   . cloNIDine 0.2 MG tablet Take 1 tab  2  times daily. May take one additional tab for sys BP > 160)  . enalapril 20 MG tablet Take  10 mg in the morning and 20 mg at bedtime  . FLAXSEED OIL Take 1,000 mg  daily  . furosemide  40 MG tablet Take 40 mg  once a day only if swelling  . Iron TABS Take 325 mg  2 (two) times daily.   . metFORMIN-XR 500 MG Take 500 mg  4  times daily  . Omega-3  FISH OIL 1000 MG  Take 1,000 mg  daily  . Red Yeast Rice 600 MG CAPS Take 600 mg  daily with breakfast  . rosuvastatin  20 MG tablet Take 10 mg  2  times daily  . verapamil  120 MG tablet Take 60 mg in the morning and 120 mg at bedtime  .  ALPRAZolam  0.5 MG tablet Take 0.25-0.5 mg  daily as needed for anxiety.   . citalopram  20 MG tablet Take 10 mg  every other night  . minoxidil  2.5 MG tablet Take 1.25 mg 2  times daily   Allergies  Allergen Reactions  . Lipitor [Atorvastatin]     Elevates LFT's   Review of Systems  10 point systems review negative except as above.    Objective:   Physical Exam  BP (!) 166/76   Pulse 76   Temp 97 F (36.1 C)   Resp 16   Ht 5' 3.5" (1.613 m)   Wt 152 lb 12.8 oz (69.3 kg)   BMI 26.64 kg/m   In no distress.  HEENT - Eac's patent. TM's Nl. EOM's full. PERRLA. NasoOroPharynx clear. Neck - supple. Nl Thyroid. Carotids 2+ & No bruits, nodes, JVD Chest -  Clear equal BS w/o Rales, rhonchi, wheezes. Cor - Nl HS. RRR w/o sig MGR. PP 1(+). No edema. MS- FROM w/o deformities. Muscle power, tone and bulk Nl. Gait Nl. Neuro - No obvious Cr N abnormalities. Sensory, motor and Cerebellar functions appear Nl w/o focal abnormalities. Psyche - Anxious    Assessment & Plan:   1. Essential hypertension  - Increase minoxidil (LONITEN) 2.5 MG tablet; Take 1 tablet 3 x /day at breakfast, supper and bedtime  Disp: 90 tablet; Refill: 1  2. Anxiety state  - D/C Alprazolam - clonazePAM (KLONOPIN) 1 MG tablet; Take 1/2 - 1 tablet 3 x/day or as directed for Anxiety  Disp: 90 tablet;   (recc start 1/2 tab 3 x/da w/ meals & 1 whole tab at Bedtime  - recc increase Celexa 20 mg to 2 tablets daily  - ROV - 2 weeks to re-evaluate

## 2016-04-19 ENCOUNTER — Other Ambulatory Visit: Payer: Self-pay | Admitting: *Deleted

## 2016-04-19 MED ORDER — METFORMIN HCL ER 500 MG PO TB24: 500.0000 mg | ORAL_TABLET | Freq: Four times a day (QID) | ORAL | 1 refills | Status: DC

## 2016-04-23 ENCOUNTER — Ambulatory Visit (INDEPENDENT_AMBULATORY_CARE_PROVIDER_SITE_OTHER): Payer: PPO | Admitting: Internal Medicine

## 2016-04-23 ENCOUNTER — Encounter: Payer: Self-pay | Admitting: Internal Medicine

## 2016-04-23 VITALS — BP 140/76 | HR 68 | Temp 97.3°F | Resp 16 | Ht 63.5 in | Wt 154.0 lb

## 2016-04-23 DIAGNOSIS — I10 Essential (primary) hypertension: Secondary | ICD-10-CM | POA: Diagnosis not present

## 2016-04-23 DIAGNOSIS — R0989 Other specified symptoms and signs involving the circulatory and respiratory systems: Secondary | ICD-10-CM

## 2016-04-23 DIAGNOSIS — F064 Anxiety disorder due to known physiological condition: Secondary | ICD-10-CM | POA: Diagnosis not present

## 2016-04-23 MED ORDER — MINOXIDIL 10 MG PO TABS: ORAL_TABLET | ORAL | 1 refills | Status: DC

## 2016-04-23 NOTE — Progress Notes (Signed)
[facility]   [name], M.D.    [name]. [name], P.A.-C      Starlyn Skeans, P.A.-C  [facility]                [address] [place], [place] [id number] Telephone [phone] Telefax [phone] Subjective:    Patient ID: Michele [name], female    DOB: 1945/06/10, 70 y.o.   MRN: [id number]  HPI  Patient is a 70 yo [name] with recent very labile and occasional accelerated HTN who is on a 6 drug regimen with still occasional break-thru spikes in BP. Previous w/u incl for  RAa stenosis has been negative. Review of patient random & occas bid BP's does still seem to demonstrate a BP rise in the early day. Patient is surprisingly asx with the BP spikes, but nonetheless is frustrated and depressed over the continuing lability of her BP's. Today's BP's are normal.   Medication Sig  . aspirin 325 MG EC tablet Take 325 mg by mouth daily.  . Atenolol 100 MG tablet Taking  50 mg  morning and 100 mg bedtime)  . Biotin 1000 MCG tablet Take 1,000 mcgdaily.   Marland Kitchen VITAMIN D  Take 10,000 Units by mouth daily.   . citalopram  20 MG tablet Take 2 tablets daily for anxiety and Mood  . clonazePAM1 MG tablet Take 1/2 - 1 tablet 3 x/day or as directed for Anxiety  . cloNIDine 0.2 MG tablet Take 0.2 mg  2 x/da . May take one add tablet for sys BP > 160  . enalapril  20 MG tablet Taking  10 mg  morning and 20 mg  bedtime)  . FLAXSEED OIL Take 1,000 mg by mouth daily. Reported on 08/22/2015  . furosemide  40 MG tablet Take 40 mg by mouth once a day   . Iron TABS Take 325 mg by mouth 2 (two) times daily.   . metFORMIN-XR 500 MG  Take 1 tablet 4 x/ daily.  . minoxidil  2.5 MG tablet Take 1 tablet 3 x /day at breakfast, supper and bedtime  . Omega-3 FISH OIL 1000 MG  Take 1,000 mg by mouth daily. Reported on 08/22/2015  . RYRE 600 MG CAPS Take 600 mg by mouth daily with breakfast. Reported on 08/22/2015  . rosuvastatin  20 MG  tablet Take 10 mg by mouth 2 (two) times daily  . verapamil  120 MG tablet Take 60 mg morning and 120 mg bedtime   Allergies  Allergen Reactions  . Lipitor [Atorvastatin]     Elevates LFT's    Past Medical History:  Diagnosis Date  . Anxiety   . Arthritis   . Carpal tunnel syndrome   . Duodenal diverticulum   . Esophageal motility disorder   . Fatty liver   . GERD (gastroesophageal reflux disease)   . Hyperlipidemia   . Hypertension   . 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 Tubulovillous adenoma of colon   . Type II or unspecified type diabetes mellitus without mention of complication, not stated as uncontrolled    Review of Systems  10 point systems review negative except as above.    Objective:   Physical Exam  BP 140/76   P 68   T 97.3 F    R 16   Ht 5' 3.5"    Wt 154 lb  BMI 26.85  Reck 134/79, P 67 - wrist and 130/85, P 67 - arm cuff  HEENT - WNL Neck - supple. Nl Thyroid. Carotids 2+ & No bruits, nodes, JVD Chest - Clear. Cor - Nl HS. RRR w/o sig MGR. PP 1(+). No edema. MS- FROM w/o deformities. Muscle power, tone and bulk Nl. Gait Nl. Neuro - Nl w/o focal abnormalities.    Assessment & Plan:   1. Labile hypertension  - change Minoxidil to 10 mg at supper to cover early day rise in BP  2. Anxiety disorder due to known physiological condition  - advised to restart her Citalopram that she misunderstood and stopped last week  - Discussed meds/SE's and ROV 2 weeks with bid list of BP's

## 2016-05-07 ENCOUNTER — Ambulatory Visit (INDEPENDENT_AMBULATORY_CARE_PROVIDER_SITE_OTHER): Payer: PPO | Admitting: Internal Medicine

## 2016-05-07 ENCOUNTER — Encounter: Payer: Self-pay | Admitting: Internal Medicine

## 2016-05-07 VITALS — BP 124/82 | HR 64 | Temp 97.2°F | Resp 16 | Ht 63.5 in | Wt 160.8 lb

## 2016-05-07 DIAGNOSIS — I10 Essential (primary) hypertension: Secondary | ICD-10-CM

## 2016-05-07 DIAGNOSIS — R0989 Other specified symptoms and signs involving the circulatory and respiratory systems: Secondary | ICD-10-CM

## 2016-05-07 NOTE — Progress Notes (Signed)
[facility]   [name], M.D.    [name]. [name], P.A.-C      Starlyn Skeans, P.A.-C  [facility]                [address] [place], [place] [address] Telephone [phone] Telefax [phone] Subjective:    Patient ID: Michele [name], female    DOB: 20-Sep-1945, 70 y.o.   MRN: [id number]  HPI  Patient is a delightful 70 yo [name] with recent very labile accelerated HTN , now currently on a 6 drug regimen for control and back for 10 day f/u after increasing her Minoxidil from 7.5 to 10 mg qpm. Bid BP's over the last 10 days show morning BP's have significantly improved and are well with in the normal range and in fact sh's had a few marginally low BP's in the range of 62-703 systolic with c/o lassitude. Denies postural sx's otherwise and HT & systems review is negative.   Medication Sig  . aspirin 325 MG EC tablet Take 325 mg by mouth daily.  Marland Kitchen atenolol (TENORMIN) 100 MG tablet Take 1 tablet (100 mg total) by mouth daily. (Patient taking differently: Take 50-100 mg by mouth See admin instructions. 50 mg in the morning and 100 mg at bedtime)  . Biotin 1000 MCG tablet Take 1,000 mcg by mouth daily. Reported on 08/22/2015  . Blood Glucose Monitoring Suppl (ONE TOUCH ULTRA SYSTEM KIT) w/Device KIT Check blood sugar 1 time daily-DX-E11.21  . Cholecalciferol (VITAMIN D PO) Take 10,000 Units by mouth daily.   . citalopram (CELEXA) 20 MG tablet Take 2 tablets daily for anxiety and Mood  . clonazePAM (KLONOPIN) 1 MG tablet Take 1/2 - 1 tablet 3 x/day or as directed for Anxiety  . cloNIDine (CATAPRES) 0.2 MG tablet Take 1 tablet (0.2 mg total) by mouth 2 (two) times daily. (Patient taking differently: Take 0.2 mg by mouth 2 (two) times daily. May take one additional tablet (0.2 mg) for systolic BP > 500)  . enalapril (VASOTEC) 20 MG tablet Take 1 tablet (20 mg total) by mouth 2 (two) times daily. (Patient taking  differently: Take 10-20 mg by mouth See admin instructions. 10 mg in the morning and 20 mg at bedtime)  . Flaxseed, Linseed, (FLAXSEED OIL PO) Take 1,000 mg by mouth daily. Reported on 08/22/2015  . furosemide (LASIX) 40 MG tablet TAKE 1 TABLET EVERY DAY (Patient taking differently: Take 40 mg by mouth once a day only if swelling is present in the legs)  . glucose blood test strip Check blood sugar 1 time daily-DX-E11.21. Please dispense One Touch Ultra test strips.  . Iron TABS Take 325 mg by mouth 2 (two) times daily.   . Lancets (ONETOUCH ULTRASOFT) lancets Check blood sugar 1 time daily-DX-E11.21  . metFORMIN (GLUCOPHAGE-XR) 500 MG 24 hr tablet Take 1 tablet (500 mg total) by mouth 4 (four) times daily.  . minoxidil (LONITEN) 10 MG tablet Take 1 tablet at suppertime  . Omega-3 Fatty Acids (FISH OIL) 1000 MG CAPS Take 1,000 mg by mouth daily. Reported on 08/22/2015  . Red Yeast Rice 600 MG CAPS Take 600 mg by mouth daily with breakfast. Reported on 08/22/2015  . rosuvastatin (CRESTOR) 20 MG tablet Take 1 tablet (20 mg total) by mouth daily at 6 PM. (Patient taking differently: Take 10 mg by mouth 2 (two) times daily. )  .  verapamil (CALAN) 120 MG tablet TAKE 1 TABLET TWICE DAILY (Patient taking differently: Take 60 mg in the morning and 120 mg at bedtime)   Allergies  Allergen Reactions  . Lipitor [Atorvastatin]     Elevates LFT's   Past Medical History:  Diagnosis Date  . Anxiety   . Arthritis   . Carpal tunnel syndrome   . Duodenal diverticulum   . Esophageal motility disorder   . Fatty liver   . GERD (gastroesophageal reflux disease)   . Hyperlipidemia   . Hypertension   . 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 Tubulovillous adenoma of colon   . Type II or unspecified type diabetes mellitus without mention of complication, not stated as uncontrolled    Review of Systems  10 point systems review negative except as above.    Objective:   Physical Exam   BP 124/82   Pulse 64   Temp  97.2 F (36.2 C)   Resp 16   Ht 5' 3.5" (1.613 m)   Wt 160 lb 12.8 oz (72.9 kg)   BMI 28.04 kg/m   HEENT - WNL Neck - supple. Nl Thyroid. Carotids 2+ & No bruits, nodes, JVD Chest - Clear. Cor - Nl HS. RRR w/o sig MGR. PP 1(+). No edema. MS- FROM.  Gait Nl. Neuro - Nl w/o focal abnormalities. Skin - exposed is clear.    Assessment & Plan:   1. Hypertension, accelerated  - advised to split Minoxidil 10 mg in 1/2 and take 1/2 tablet bid and continue BID BP monitoring and return in 1 moth or sooner if BP's unstable.   - discussed meds/SE's.  - ROV - prn - Over 20 minutes of exam, counseling, chart review and critical decision making was performed

## 2016-06-11 ENCOUNTER — Ambulatory Visit (INDEPENDENT_AMBULATORY_CARE_PROVIDER_SITE_OTHER): Payer: PPO | Admitting: Internal Medicine

## 2016-06-11 ENCOUNTER — Encounter: Payer: Self-pay | Admitting: Internal Medicine

## 2016-06-11 ENCOUNTER — Other Ambulatory Visit: Payer: Self-pay | Admitting: *Deleted

## 2016-06-11 VITALS — BP 114/66 | HR 60 | Temp 97.6°F | Resp 16 | Ht 63.5 in | Wt 160.6 lb

## 2016-06-11 DIAGNOSIS — R0989 Other specified symptoms and signs involving the circulatory and respiratory systems: Secondary | ICD-10-CM

## 2016-06-11 DIAGNOSIS — E1121 Type 2 diabetes mellitus with diabetic nephropathy: Secondary | ICD-10-CM

## 2016-06-11 DIAGNOSIS — I10 Essential (primary) hypertension: Secondary | ICD-10-CM

## 2016-06-11 MED ORDER — METFORMIN HCL ER 500 MG PO TB24: 500.0000 mg | ORAL_TABLET | Freq: Four times a day (QID) | ORAL | 1 refills | Status: DC

## 2016-06-11 NOTE — Progress Notes (Signed)
[facility]   [name], M.D.    [name]. [name], P.A.-C      Starlyn Skeans, P.A.-C  [facility]                [address] [place] [id number] Telephone [phone] Telefax [phone]  Subjective:    Patient ID: Michele [name], female    DOB: [birth year], 71 y.o.   MRN: [id number]  HPI  Patient returns for 1 month f/u of very labile HTN - on a 6 drug regimen - with review of BP's showing much better control with 90% of recorded BP's ranging betw 125-145/60-90. She does report occasional mild postural light headedness with rapid standing and denies any syncope/LOC.   WRT her DM, infreq CBG's have ranged betw 130-160 mg%. She denies any diabetic polys, paresthesias or visual blurring.   Medication Sig  . aspirin 325 MG EC tablet Take 325 mg by mouth daily.  Marland Kitchen atenolol (T 100 MG tablet Take  50 mg in the morning and 100 mg at bedtime  . Biotin 1000 MCG tablet Take 1,000 mcg by mouth daily. Reported on 08/22/2015  . VITAMIN D Take 10,000 Units by mouth daily.   . citalopram  20 MG tablet Take 2 tablets daily for anxiety and Mood  . clonazePAM  1 MG tablet Take 1/2 - 1 tablet 3 x/day or as directed for Anxiety  . cloNIDine  0.2 MG tablet Take 0.2 mg  2  x daily. May take one add'l tab for sys BP > 160  . enalapril  20 MG tablet Take 10 mg in the morning and 20 mg at bedtime)  .                                                                                             FLAXSEED OIL  Take 1,000 mg by  daily  . furosemide (LASIX) 40 MG tablet TAKE 1 TABLET EVERY DAY (Patient taking differently: Take 40 mg by mouth once a day only if swelling is present in the legs)  . glucose blood test strip Check blood sugar 1 time daily-DX-E11.21. Please dispense One Touch Ultra test strips.  . Iron TABS Take 325 mg by mouth 2 (two) times daily.   . Lancets (ONETOUCH ULTRASOFT) lancets Check blood sugar 1  time daily-DX-E11.21  . minoxidil (LONITEN) 10 MG tablet Take 1 tablet at suppertime  . Omega-3 Fatty Acids (FISH OIL) 1000 MG CAPS Take 1,000 mg by mouth daily. Reported on 08/22/2015  . Red Yeast Rice 600 MG CAPS Take 600 mg by mouth daily with breakfast. Reported on 08/22/2015  . rosuvastatin (CRESTOR) 20 MG tablet Take 1 tablet (20 mg total) by mouth daily at 6 PM. (Patient taking differently: Take 10 mg by mouth 2 (two) times daily. )  . verapamil (CALAN) 120 MG tablet TAKE 1 TABLET TWICE DAILY (Patient taking differently: Take 60 mg in the morning and 120 mg at bedtime)  . metFORMIN (GLUCOPHAGE-XR) 500 MG  24 hr tablet Take 1 tablet (500 mg total) by mouth 4 (four) times daily.   No facility-administered medications prior to visit.    Allergies  Allergen Reactions  . Lipitor [Atorvastatin]     Elevates LFT's   Past Medical History:  Diagnosis Date  . Anxiety   . Arthritis   . Carpal tunnel syndrome   . Duodenal diverticulum   . Esophageal motility disorder   . Fatty liver   . GERD (gastroesophageal reflux disease)   . Hyperlipidemia   . Hypertension   . 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 Tubulovillous adenoma of colon   . Type II or unspecified type diabetes mellitus without mention of complication, not stated as uncontrolled    Review of Systems  10 point systems review negative except as above.    Objective:   Physical Exam  BP 114/66   Pulse 60   Temp 97.6 F (36.4 C)   Resp 16   Ht 5' 3.5" (1.613 m)   Wt 160 lb 9.6 oz (72.8 kg)   BMI 28.00 kg/m   Skin clear w/Vitiligo.  HEENT - Eac's patent. TM's Nl. EOM's full. PERRLA. NasoOroPharynx clear. Neck - supple. Nl Thyroid. Carotids 2+ & No bruits, nodes, JVD Chest - Clear equal BS w/o Rales, rhonchi, wheezes. Cor - Nl HS. RRR w/o sig MGR. PP 1(+). No edema. Abd - No palpable organomegaly, masses or tenderness. BS nl. MS- FROM w/o deformities. Muscle power, tone and bulk Nl. Gait Nl. Neuro - No obvious Cr N  abnormalities. Sensory, motor and Cerebellar functions appear Nl w/o focal abnormalities.    Assessment & Plan:   1. Labile hypertension  - continue meds same - discussed meds and SE's.   2. T2_NIDDM   Over 20 minutes of exam, counseling, chart review and high complex critical decision making was performed

## 2016-06-18 ENCOUNTER — Telehealth: Payer: Self-pay

## 2016-06-18 NOTE — Telephone Encounter (Signed)
Called pt. Appt r/x to 07/03/16 due to inclement weather.

## 2016-06-19 ENCOUNTER — Ambulatory Visit: Payer: PPO | Admitting: Neurology

## 2016-07-03 ENCOUNTER — Encounter: Payer: Self-pay | Admitting: Neurology

## 2016-07-03 ENCOUNTER — Ambulatory Visit (INDEPENDENT_AMBULATORY_CARE_PROVIDER_SITE_OTHER): Payer: PPO | Admitting: Neurology

## 2016-07-03 VITALS — BP 127/72 | HR 57 | Ht 63.5 in | Wt 159.0 lb

## 2016-07-03 DIAGNOSIS — H1851 Endothelial corneal dystrophy: Secondary | ICD-10-CM | POA: Diagnosis not present

## 2016-07-03 DIAGNOSIS — E113291 Type 2 diabetes mellitus with mild nonproliferative diabetic retinopathy without macular edema, right eye: Secondary | ICD-10-CM | POA: Diagnosis not present

## 2016-07-03 DIAGNOSIS — H2513 Age-related nuclear cataract, bilateral: Secondary | ICD-10-CM | POA: Diagnosis not present

## 2016-07-03 DIAGNOSIS — H5213 Myopia, bilateral: Secondary | ICD-10-CM | POA: Diagnosis not present

## 2016-07-03 DIAGNOSIS — R42 Dizziness and giddiness: Secondary | ICD-10-CM

## 2016-07-03 NOTE — Progress Notes (Signed)
Reason for visit: Dizziness  Michele [name] is an 71 y.o. female  History of present illness:  Michele [name] is a 71 year old right-handed white female with a history of hyperthyroidism that was first noted in March 2017. The patient has a history of significant hypertension, she is on 4 separate medications to help control this. She has had MRI evaluation of the brain that shows evidence of white matter changes in the middle cerebellar peduncles bilaterally and in the right pontine regions. The patient has had a generalized sense of fatigue as well since March 2017, this has been persistent. She sleeps well at night, in the morning she feels like she has good energy, but the fatigue begins in the afternoon. The patient has increased dizziness along with fatigue. Her blood pressures have been difficult to control, she believes that her blood pressures are better at this time. She denies any issues with swallowing or choking. She has had no problems with drooling. She has mild gait instability, but no falls. She returns for an evaluation.  Past Medical History:  Diagnosis Date  . Anxiety   . Arthritis   . Carpal tunnel syndrome   . Duodenal diverticulum   . Esophageal motility disorder   . Fatty liver   . GERD (gastroesophageal reflux disease)   . Hyperlipidemia   . Hypertension   . 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 Tubulovillous adenoma of colon   . Type II or unspecified type diabetes mellitus without mention of complication, not stated as uncontrolled     Past Surgical History:  Procedure Laterality Date  . APPENDECTOMY    . CARPAL TUNNEL RELEASE Right 1992  . CYST REMOVAL HAND    . TONSILLECTOMY  1952    Family History  Problem Relation Age of Onset  . Heart disease Mother   . Diabetes Mother   . Colon polyps Mother   . Heart disease Father   . Diabetes Father   . Heart attack Father   . Heart disease Brother   . Heart attack Brother   . Cerebral aneurysm Sister   .  Heart disease Brother     Stent    Social history:  reports that she has never smoked. She has never used smokeless tobacco. She reports that she does not drink alcohol or use drugs.    Allergies  Allergen Reactions  . Lipitor [Atorvastatin]     Elevates LFT's    Medications:  Prior to Admission medications   Medication Sig Start Date End Date Taking? Authorizing Provider  aspirin 325 MG EC tablet Take 325 mg by mouth daily.   Yes Historical Provider, MD  atenolol (TENORMIN) 100 MG tablet Take 1 tablet (100 mg total) by mouth daily. Patient taking differently: Take 50-100 mg by mouth See admin instructions. 50 mg in the morning and 100 mg at bedtime 07/14/15  Yes [name], MD  Biotin 1000 MCG tablet Take 1,000 mcg by mouth daily. Reported on 08/22/2015   Yes Historical Provider, MD  Blood Glucose Monitoring Suppl (ONE TOUCH ULTRA SYSTEM KIT) w/Device KIT Check blood sugar 1 time daily-DX-E11.21 03/27/16  Yes [name], MD  Cholecalciferol (VITAMIN D PO) Take 10,000 Units by mouth daily.    Yes Historical Provider, MD  citalopram (CELEXA) 20 MG tablet Take 2 tablets daily for anxiety and Mood 04/09/16  Yes [name], MD  cloNIDine (CATAPRES) 0.2 MG tablet Take 1 tablet (0.2 mg total) by mouth 2 (two) times daily. Patient taking differently:  Take 0.2 mg by mouth 2 (two) times daily. May take one additional tablet (0.2 mg) for systolic BP > 803 07/15/22  Yes Reyne Dumas, MD  enalapril (VASOTEC) 20 MG tablet Take 1 tablet (20 mg total) by mouth 2 (two) times daily. Patient taking differently: Take 10-20 mg by mouth See admin instructions. 10 mg in the morning and 20 mg at bedtime 01/22/16  Yes [name], MD  Flaxseed, Linseed, (FLAXSEED OIL PO) Take 1,000 mg by mouth daily. Reported on 08/22/2015   Yes Historical Provider, MD  furosemide (LASIX) 40 MG tablet TAKE 1 TABLET EVERY DAY Patient taking differently: Take 40 mg by mouth once a day only if swelling is present in  the legs 03/17/14  Yes [name], MD  glucose blood test strip Check blood sugar 1 time daily-DX-E11.21. Please dispense One Touch Ultra test strips. 04/09/16  Yes [name], MD  Iron TABS Take 325 mg by mouth 2 (two) times daily.    Yes Historical Provider, MD  Lancets [facility] ULTRASOFT) lancets Check blood sugar 1 time daily-DX-E11.21 03/27/16  Yes [name], MD  metFORMIN (GLUCOPHAGE-XR) 500 MG 24 hr tablet Take 1 tablet (500 mg total) by mouth 4 (four) times daily. 06/11/16 12/04/17 Yes [name], MD  minoxidil (LONITEN) 10 MG tablet Take 1 tablet at suppertime 04/23/16 10/21/16 Yes [name], MD  Omega-3 Fatty Acids (FISH OIL) 1000 MG CAPS Take 1,000 mg by mouth daily. Reported on 08/22/2015   Yes Historical Provider, MD  Red Yeast Rice 600 MG CAPS Take 600 mg by mouth daily with breakfast. Reported on 08/22/2015   Yes Historical Provider, MD  rosuvastatin (CRESTOR) 20 MG tablet Take 1 tablet (20 mg total) by mouth daily at 6 PM. Patient taking differently: Take 10 mg by mouth 2 (two) times daily.  10/31/15  Yes [name], MD  verapamil (CALAN) 120 MG tablet TAKE 1 TABLET TWICE DAILY Patient taking differently: Take 60 mg in the morning and 120 mg at bedtime 03/17/15  Yes [name], MD    ROS:  Out of a complete 14 system review of symptoms, the patient complains only of the following symptoms, and all other reviewed systems are negative.  Fatigue Constipation Weakness, passing out Walking difficulty Daytime sleepiness  Blood pressure 127/72, pulse (!) 57, height 5' 3.5" (1.613 m), weight 159 lb (72.1 kg).  Physical Exam  General: The patient is alert and cooperative at the time of the examination.  Skin: No significant peripheral edema is noted.   Neurologic Exam  Mental status: The patient is alert and oriented x 3 at the time of the examination. The patient has apparent normal recent and remote memory, with an apparently normal attention  span and concentration ability.   Cranial nerves: Facial symmetry is present. Speech is slightly dysarthric, not aphasic. Extraocular movements are full. Visual fields are full.  Motor: The patient has good strength in all 4 extremities.  Sensory examination: Soft touch sensation is symmetric on the face, arms, and legs.  Coordination: The patient has good finger-nose-finger and heel-to-shin bilaterally.  Gait and station: The patient has a minimally wide-based gait, the patient has a significant reduction arm swing bilaterally with walking. Romberg is negative. No drift is seen.  Reflexes: Deep tendon reflexes are symmetric.   Assessment/Plan:  1. History of hyperthyroidism  2. Hypertension  3. Small vessel disease by MRI brain  4. Chronic dizziness  5. Gait disturbance  Clinical examination today shows that the patient has a  mild dysarthria, she also has some features of parkinsonism with mild masking of the face, decreased arm swing with walking. I suspect that the symptoms associated with dizziness, slowness of movement and difficulty walking are related to the ischemic changes seen in the brainstem and middle cerebellar peduncles bilaterally. We will follow the gait disturbance conservatively over time to look for any evolution. I do not believe the patient has true Parkinson's disease. The fatigue issues may be difficult to treat, as well as the dizziness. The diffuse neuromuscular discomfort she was experiencing has improved with reduction of the Crestor dosing. I will see her back in 6 months.  Jill Alexanders MD 07/03/2016 1:12 PM  [name] [address] [place], [place] [address]  Phone [phone] Fax [phone]

## 2016-07-12 ENCOUNTER — Encounter: Payer: Self-pay | Admitting: Internal Medicine

## 2016-07-20 ENCOUNTER — Other Ambulatory Visit: Payer: Self-pay | Admitting: Internal Medicine

## 2016-08-07 ENCOUNTER — Encounter: Payer: Self-pay | Admitting: Internal Medicine

## 2016-08-07 ENCOUNTER — Ambulatory Visit (INDEPENDENT_AMBULATORY_CARE_PROVIDER_SITE_OTHER): Payer: PPO | Admitting: Internal Medicine

## 2016-08-07 VITALS — BP 86/48 | HR 60 | Temp 97.4°F | Resp 16 | Ht 63.0 in | Wt 157.8 lb

## 2016-08-07 DIAGNOSIS — E782 Mixed hyperlipidemia: Secondary | ICD-10-CM | POA: Diagnosis not present

## 2016-08-07 DIAGNOSIS — R6889 Other general symptoms and signs: Secondary | ICD-10-CM | POA: Diagnosis not present

## 2016-08-07 DIAGNOSIS — M1000 Idiopathic gout, unspecified site: Secondary | ICD-10-CM

## 2016-08-07 DIAGNOSIS — I10 Essential (primary) hypertension: Secondary | ICD-10-CM

## 2016-08-07 DIAGNOSIS — Z136 Encounter for screening for cardiovascular disorders: Secondary | ICD-10-CM

## 2016-08-07 DIAGNOSIS — R0989 Other specified symptoms and signs involving the circulatory and respiratory systems: Secondary | ICD-10-CM

## 2016-08-07 DIAGNOSIS — E559 Vitamin D deficiency, unspecified: Secondary | ICD-10-CM

## 2016-08-07 DIAGNOSIS — Z1212 Encounter for screening for malignant neoplasm of rectum: Secondary | ICD-10-CM

## 2016-08-07 DIAGNOSIS — Z79899 Other long term (current) drug therapy: Secondary | ICD-10-CM

## 2016-08-07 DIAGNOSIS — E0590 Thyrotoxicosis, unspecified without thyrotoxic crisis or storm: Secondary | ICD-10-CM | POA: Diagnosis not present

## 2016-08-07 DIAGNOSIS — Z0001 Encounter for general adult medical examination with abnormal findings: Secondary | ICD-10-CM | POA: Diagnosis not present

## 2016-08-07 DIAGNOSIS — E1121 Type 2 diabetes mellitus with diabetic nephropathy: Secondary | ICD-10-CM

## 2016-08-07 DIAGNOSIS — K219 Gastro-esophageal reflux disease without esophagitis: Secondary | ICD-10-CM

## 2016-08-07 NOTE — Progress Notes (Signed)
[facility] Michele [name], M.D.    [name]. [name], P.A.-C      Starlyn Skeans, P.A.-C  [facility] [place] [facility]                [address] [place] [address] Telephone [phone] Telefax [phone]  Annual Screening/Preventative Visit & Comprehensive Evaluation &  Examination     This very nice 71 y.o. [name] presents for a Screening/Preventative Visit & comprehensive evaluation and management of multiple medical co-morbidities.  Patient has been followed for HTN, T2_NIDDM, Hyperlipidemia and Vitamin D Deficiency. 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       HTN predates since 18. Patient's BP has been labile at home. Patient had a negative nuclear stress test in 2017 by Dr Fransico Him. Patient denies any cardiac symptoms as chest pain, palpitations, shortness of breath, dizziness or ankle swelling.  Over the last several months , patients BP's have been very labile and ranged from accelerated to posturally hypotensive and she seems to have some comprehension gap on how & when to titrate her meds despite repeated instructions to the point. When BP's are low , she does admit sensations of fatigue/weakness. Today's BP is sitting-86/48 and standing-76/46 and then repeated at 120/68, P55 sitting and 99/57, P55 standing. Renal artery U/S was negative.      Patient's hyperlipidemia is controlled with diet and medications. Patient denies myalgias or other medication SE's. Last lipids were at goal albeit elevated Trig's: Lab Results  Component Value Date   CHOL 112 (L) 03/27/2016   HDL 42 (L) 03/27/2016   LDLCALC 35 03/27/2016   TRIG 174 (H) 03/27/2016   CHOLHDL 2.7 03/27/2016      Patient has T2_NIDDM circa 2006 and she has CKD2 which has been stable.     and patient denies reactive hypoglycemic symptoms, visual blurring, diabetic polys, or paresthesias. Last A1c was  not at goal: Lab Results  Component Value Date   HGBA1C 6.1 (H) 03/27/2016      Finally, patient has history of Vitamin D Deficiency ("19" in 2008) and last Vitamin D was at goal: Lab Results  Component Value Date   VD25OH 57 03/27/2016   Current Outpatient Prescriptions on File Prior to Visit  Medication Sig  . aspirin 325 MG EC Take  daily.  Marland Kitchen atenolol  100 MG TAKE ONE TAB DAILY  . Biotin 1000 MCG Take  daily  . VITAMIN D  10,000 Units Take   daily.   . citalopram  20 MG Take 2 tab daily for anxiety and Mood  . cloNIDine  0.2 MG  Take  2 x daily.   . enalapril 20 MG  Take  10 mg in the morning and 20 mg at bedtime  . FLAXSEED OIL 1,000 mg  Take  daily.   . furosemide  40 MG  TAKE 1 TABLET EVERY DAY  . Iron TABS  325 mg  Take 2 x daily.   . metFORMIN-XR 500 MG  Take 1 tablet 4 x daily.  . minoxidil  10 MG tablet Take 1 tablet at suppertime  . Omega-3 FISH OIL) 1000 MG  Take  daily  . Red Yeast Rice 600 MG Take  daily with breakfast  . rosuvastatin 20 MG Takes 10 mg  2 times daily.   . verapamil (CALAN) 120 MG  Take 60 mg in the morning and 120 mg at bedtime)   Allergies  Allergen Reactions  . Lipitor [Atorvastatin]     Elevates LFT's   Past Medical History:  Diagnosis Date  . Anxiety   . Arthritis   . Carpal tunnel syndrome   . Duodenal diverticulum   . Esophageal motility disorder   . Fatty liver   . GERD (gastroesophageal reflux disease)   . Hyperlipidemia   . Hypertension   . 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 Tubulovillous adenoma of colon   . Type II or unspecified type diabetes mellitus without mention of complication, not stated as uncontrolled    Health Maintenance  Topic Date Due  . FOOT EXAM  07/13/2016  . HEMOGLOBIN A1C  09/25/2016  . OPHTHALMOLOGY EXAM  07/03/2017  . COLONOSCOPY  09/22/2017  . MAMMOGRAM  01/31/2018  . TETANUS/TDAP  03/26/2023  . INFLUENZA VACCINE  Completed  . DEXA SCAN  Completed  . Hepatitis C Screening  Completed  . PNA vac Low  Risk Adult  Completed   Immunization History  Administered Date(s) Administered  . Influenza Split 03/25/2013  . Influenza, High Dose Seasonal PF 03/15/2014, 03/07/2015, 03/27/2016  . Pneumococcal Conjugate-13 01/26/2014  . Pneumococcal Polysaccharide-23 03/14/2011  . Td 03/25/2013  . Zoster 06/03/2005   Past Surgical History:  Procedure Laterality Date  . APPENDECTOMY    . CARPAL TUNNEL RELEASE Right 1992  . CYST REMOVAL HAND    . TONSILLECTOMY  1952   Family History  Problem Relation Age of Onset  . Heart disease Mother   . Diabetes Mother   . Colon polyps Mother   . Heart disease Father   . Diabetes Father   . Heart attack Father   . Heart disease Brother   . Heart attack Brother   . Cerebral aneurysm Sister   . Heart disease Brother     Stent   Social History  Substance Use Topics  . Smoking status: Never Smoker  . Smokeless tobacco: Never Used  . Alcohol use No    ROS Constitutional: Denies fever, chills, weight loss/gain, headaches, insomnia,  night sweats, and change in appetite. Does c/o fatigue. Eyes: Denies redness, blurred vision, diplopia, discharge, itchy, watery eyes.  ENT: Denies discharge, congestion, post nasal drip, epistaxis, sore throat, earache, hearing loss, dental pain, Tinnitus, Vertigo, Sinus pain, snoring.  Cardio: Denies chest pain, palpitations, irregular heartbeat, syncope, dyspnea, diaphoresis, orthopnea, PND, claudication, edema Respiratory: denies cough, dyspnea, DOE, pleurisy, hoarseness, laryngitis, wheezing.  Gastrointestinal: Denies dysphagia, heartburn, reflux, water brash, pain, cramps, nausea, vomiting, bloating, diarrhea, constipation, hematemesis, melena, hematochezia, jaundice, hemorrhoids Genitourinary: Denies dysuria, frequency, urgency, nocturia, hesitancy, discharge, hematuria, flank pain Breast: Breast lumps, nipple discharge, bleeding.  Musculoskeletal: Denies arthralgia, myalgia, stiffness, Jt. Swelling, pain, limp, and  strain/sprain. Denies falls. Skin: Denies puritis, rash, hives, warts, acne, eczema, changing in skin lesion Neuro: No weakness, tremor, incoordination, spasms, paresthesia, pain Psychiatric: Denies confusion, memory loss, sensory loss. Denies Depression. Endocrine: Denies change in weight, skin, hair change, nocturia, and paresthesia, diabetic polys, visual blurring, hyper / hypo glycemic episodes.  Heme/Lymph: No excessive bleeding, bruising, enlarged lymph nodes.  Physical Exam  BP sit-86/48/stand-76/46  P 60   T 97.4 F   R 16   Ht 5\' 3"     Wt 157 lb 12.8 oz    BMI 27.95  General Appearance: Over nourished and in no apparent distress.  Eyes: PERRLA, EOMs, conjunctiva no swelling or erythema, normal fundi and vessels. Sinuses: No frontal/maxillary  tenderness ENT/Mouth: EACs patent / TMs  nl. Nares clear without erythema, swelling, mucoid exudates. Oral hygiene is good. No erythema, swelling, or exudate. Tongue normal, non-obstructing. Tonsils not swollen or erythematous. Hearing normal.  Neck: Supple, thyroid normal. No bruits, nodes or JVD. Respiratory: Respiratory effort normal.  BS equal and clear bilateral without rales, rhonci, wheezing or stridor. Cardio: Heart sounds are normal with regular rate and rhythm and no murmurs, rubs or gallops. Peripheral pulses are normal and equal bilaterally without edema. No aortic or femoral bruits. Chest: symmetric with normal excursions and percussion. Breasts: Symmetric, without lumps, nipple discharge, retractions, or fibrocystic changes.  Abdomen: Flat, soft with bowel sounds active. Nontender, no guarding, rebound, hernias, masses, or organomegaly.  Lymphatics: Non tender without lymphadenopathy.  Genitourinary:  Musculoskeletal: Full ROM all peripheral extremities, joint stability, 5/5 strength, and normal gait. Skin: Warm and dry without rashes, lesions, cyanosis, clubbing or  ecchymosis.  Neuro: Cranial nerves intact, reflexes equal  bilaterally. Normal muscle tone, no cerebellar symptoms. Sensation intact to touch, vibratory and monofilament to the toes bilaterally. Pysch: Alert and oriented X 3, normal affect, Insight and Judgment appropriate.   Assessment and Plan  1. Annual Preventative Screening Examination  2. Labile hypertension  - Microalbumin / creatinine urine ratio - EKG 12-Lead - Urinalysis, Routine w reflex microscopic - CBC with Differential/Platelet - BASIC METABOLIC PANEL WITH GFR - Magnesium - TSH  3. Mixed hyperlipidemia  - EKG 12-Lead - Hepatic function panel - Lipid panel - TSH  4. 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 Vitamin D deficiency  - VITAMIN D 25 Hydroxy   6. Hyperthyroidism  - TSH  7. Idiopathic gout  - Uric acid  8. Screening for rectal cancer  - POC Hemoccult Bld/Stl   9. Gastroesophageal reflux disease,   10. Screening for ischemic heart disease  - EKG 12-Lead  11. Medication management  - Urinalysis, Routine w reflex microscopic - CBC with Differential/Platelet - BASIC METABOLIC PANEL WITH GFR - Hepatic function panel - Magnesium - Lipid panel - TSH - Hemoglobin A1c - Insulin, random - VITAMIN D 25 Hydroxy       Continue prudent diet as discussed, weight control, BP monitoring, regular exercise, and medications. Discussed med's effects and SE's. Screening labs and tests as requested with regular follow-up as recommended. Over 40 minutes of exam, counseling, chart review and high complex critical decision making was performed.

## 2016-08-07 NOTE — Patient Instructions (Signed)

## 2016-08-08 LAB — BASIC METABOLIC PANEL WITH GFR
BUN: 13 mg/dL (ref 7–25)
CALCIUM: 9.1 mg/dL (ref 8.6–10.4)
CO2: 29 mmol/L (ref 20–31)
Chloride: 103 mmol/L (ref 98–110)
Creat: 0.89 mg/dL (ref 0.60–0.93)
GFR, EST AFRICAN AMERICAN: 76 mL/min (ref >=60)
GFR, EST NON AFRICAN AMERICAN: 66 mL/min (ref >=60)
Glucose, Bld: 126 mg/dL — ABNORMAL HIGH (ref 65–99)
POTASSIUM: 4 mmol/L (ref 3.5–5.3)
SODIUM: 140 mmol/L (ref 135–146)

## 2016-08-08 LAB — LIPID PANEL
CHOLESTEROL: 103 mg/dL (ref <200)
HDL: 42 mg/dL — AB (ref >50)
LDL CALC: 28 mg/dL (ref <100)
TRIGLYCERIDES: 166 mg/dL — AB (ref <150)
Total CHOL/HDL Ratio: 2.5 Ratio (ref <5.0)
VLDL: 33 mg/dL — AB (ref <30)

## 2016-08-08 LAB — CBC WITH DIFFERENTIAL/PLATELET
BASOS PCT: 1 %
Basophils Absolute: 52 cells/uL (ref 0–200)
EOS PCT: 2 %
Eosinophils Absolute: 104 cells/uL (ref 15–500)
HCT: 39.1 % (ref 35.0–45.0)
Hemoglobin: 12.8 g/dL (ref 11.7–15.5)
LYMPHS PCT: 33 %
Lymphs Abs: 1716 cells/uL (ref 850–3900)
MCH: 27.2 pg (ref 27.0–33.0)
MCHC: 32.7 g/dL (ref 32.0–36.0)
MCV: 83.2 fL (ref 80.0–100.0)
MONO ABS: 572 {cells}/uL (ref 200–950)
MONOS PCT: 11 %
MPV: 8.8 fL (ref 7.5–12.5)
Neutro Abs: 2756 cells/uL (ref 1500–7800)
Neutrophils Relative %: 53 %
PLATELETS: 173 10*3/uL (ref 140–400)
RBC: 4.7 MIL/uL (ref 3.80–5.10)
RDW: 15 % (ref 11.0–15.0)
WBC: 5.2 10*3/uL (ref 3.8–10.8)

## 2016-08-08 LAB — HEMOGLOBIN A1C
Hgb A1c MFr Bld: 5.9 % — ABNORMAL HIGH (ref <5.7)
MEAN PLASMA GLUCOSE: 123 mg/dL

## 2016-08-08 LAB — URINALYSIS, MICROSCOPIC ONLY
CRYSTALS: NONE SEEN [HPF]
Casts: NONE SEEN [LPF]
RBC / HPF: NONE SEEN RBC/HPF (ref <=2)
Yeast: NONE SEEN [HPF]

## 2016-08-08 LAB — HEPATIC FUNCTION PANEL
ALK PHOS: 50 U/L (ref 33–130)
ALT: 25 U/L (ref 6–29)
AST: 25 U/L (ref 10–35)
Albumin: 4.1 g/dL (ref 3.6–5.1)
BILIRUBIN INDIRECT: 0.6 mg/dL (ref 0.2–1.2)
BILIRUBIN TOTAL: 0.8 mg/dL (ref 0.2–1.2)
Bilirubin, Direct: 0.2 mg/dL (ref <=0.2)
Total Protein: 6.3 g/dL (ref 6.1–8.1)

## 2016-08-08 LAB — TSH: TSH: 0.92 mIU/L

## 2016-08-08 LAB — URINALYSIS, ROUTINE W REFLEX MICROSCOPIC
Bilirubin Urine: NEGATIVE
Glucose, UA: NEGATIVE
HGB URINE DIPSTICK: NEGATIVE
KETONES UR: NEGATIVE
NITRITE: NEGATIVE
PROTEIN: NEGATIVE
SPECIFIC GRAVITY, URINE: 1.015 (ref 1.001–1.035)
pH: 5.5 (ref 5.0–8.0)

## 2016-08-08 LAB — MICROALBUMIN / CREATININE URINE RATIO
Creatinine, Urine: 149 mg/dL (ref 20–320)
Microalb Creat Ratio: 17 mcg/mg creat (ref <30)
Microalb, Ur: 2.5 mg/dL

## 2016-08-08 LAB — MAGNESIUM: Magnesium: 1.4 mg/dL — ABNORMAL LOW (ref 1.5–2.5)

## 2016-08-08 LAB — URIC ACID: URIC ACID, SERUM: 5.4 mg/dL (ref 2.5–7.0)

## 2016-08-08 LAB — INSULIN, RANDOM: INSULIN: 15 u[IU]/mL (ref 2.0–19.6)

## 2016-08-08 LAB — VITAMIN D 25 HYDROXY (VIT D DEFICIENCY, FRACTURES): Vit D, 25-Hydroxy: 69 ng/mL (ref 30–100)

## 2016-08-22 ENCOUNTER — Other Ambulatory Visit: Payer: Self-pay | Admitting: Internal Medicine

## 2016-09-09 ENCOUNTER — Encounter: Payer: Self-pay | Admitting: *Deleted

## 2016-09-09 DIAGNOSIS — L603 Nail dystrophy: Secondary | ICD-10-CM | POA: Diagnosis not present

## 2016-09-09 DIAGNOSIS — L80 Vitiligo: Secondary | ICD-10-CM | POA: Diagnosis not present

## 2016-09-09 DIAGNOSIS — L814 Other melanin hyperpigmentation: Secondary | ICD-10-CM | POA: Diagnosis not present

## 2016-09-09 DIAGNOSIS — L853 Xerosis cutis: Secondary | ICD-10-CM | POA: Diagnosis not present

## 2016-09-09 DIAGNOSIS — I788 Other diseases of capillaries: Secondary | ICD-10-CM | POA: Diagnosis not present

## 2016-09-09 DIAGNOSIS — L905 Scar conditions and fibrosis of skin: Secondary | ICD-10-CM | POA: Diagnosis not present

## 2016-09-09 DIAGNOSIS — L821 Other seborrheic keratosis: Secondary | ICD-10-CM | POA: Diagnosis not present

## 2016-09-09 DIAGNOSIS — D225 Melanocytic nevi of trunk: Secondary | ICD-10-CM | POA: Diagnosis not present

## 2016-09-18 ENCOUNTER — Other Ambulatory Visit: Payer: Self-pay | Admitting: *Deleted

## 2016-09-18 MED ORDER — ATENOLOL 100 MG PO TABS
ORAL_TABLET | ORAL | 1 refills | Status: DC
Start: 2016-09-18 — End: 2016-09-20

## 2016-09-20 ENCOUNTER — Other Ambulatory Visit: Payer: Self-pay | Admitting: *Deleted

## 2016-09-20 MED ORDER — ATENOLOL 100 MG PO TABS: ORAL_TABLET | ORAL | 1 refills | Status: DC

## 2016-09-24 ENCOUNTER — Other Ambulatory Visit: Payer: Self-pay | Admitting: *Deleted

## 2016-09-24 DIAGNOSIS — Z1212 Encounter for screening for malignant neoplasm of rectum: Secondary | ICD-10-CM

## 2016-09-24 LAB — POC HEMOCCULT BLD/STL (HOME/3-CARD/SCREEN)
Card #3 Fecal Occult Blood, POC: NEGATIVE
FECAL OCCULT BLD: NEGATIVE
Fecal Occult Blood, POC: NEGATIVE

## 2016-10-18 ENCOUNTER — Other Ambulatory Visit: Payer: Self-pay | Admitting: Internal Medicine

## 2016-10-18 DIAGNOSIS — R0989 Other specified symptoms and signs involving the circulatory and respiratory systems: Secondary | ICD-10-CM

## 2016-11-07 ENCOUNTER — Ambulatory Visit: Payer: Self-pay | Admitting: Internal Medicine

## 2016-11-20 ENCOUNTER — Other Ambulatory Visit: Payer: Self-pay | Admitting: Internal Medicine

## 2016-11-20 NOTE — Telephone Encounter (Signed)
Please call Clonazepam 

## 2016-12-03 ENCOUNTER — Ambulatory Visit (INDEPENDENT_AMBULATORY_CARE_PROVIDER_SITE_OTHER): Payer: PPO | Admitting: Internal Medicine

## 2016-12-03 VITALS — BP 102/64 | HR 64 | Temp 97.4°F | Resp 16 | Ht 63.0 in | Wt 153.0 lb

## 2016-12-03 DIAGNOSIS — E1121 Type 2 diabetes mellitus with diabetic nephropathy: Secondary | ICD-10-CM | POA: Diagnosis not present

## 2016-12-03 DIAGNOSIS — E0590 Thyrotoxicosis, unspecified without thyrotoxic crisis or storm: Secondary | ICD-10-CM | POA: Diagnosis not present

## 2016-12-03 DIAGNOSIS — Z79899 Other long term (current) drug therapy: Secondary | ICD-10-CM

## 2016-12-03 DIAGNOSIS — K219 Gastro-esophageal reflux disease without esophagitis: Secondary | ICD-10-CM | POA: Diagnosis not present

## 2016-12-03 DIAGNOSIS — M1000 Idiopathic gout, unspecified site: Secondary | ICD-10-CM

## 2016-12-03 DIAGNOSIS — R0989 Other specified symptoms and signs involving the circulatory and respiratory systems: Secondary | ICD-10-CM | POA: Diagnosis not present

## 2016-12-03 DIAGNOSIS — M791 Myalgia, unspecified site: Secondary | ICD-10-CM

## 2016-12-03 DIAGNOSIS — E559 Vitamin D deficiency, unspecified: Secondary | ICD-10-CM | POA: Diagnosis not present

## 2016-12-03 DIAGNOSIS — E782 Mixed hyperlipidemia: Secondary | ICD-10-CM | POA: Diagnosis not present

## 2016-12-03 LAB — CBC WITH DIFFERENTIAL/PLATELET
Basophils Absolute: 0 cells/uL (ref 0–200)
Basophils Relative: 0 %
Eosinophils Absolute: 177 cells/uL (ref 15–500)
Eosinophils Relative: 3 %
HCT: 40.3 % (ref 35.0–45.0)
Hemoglobin: 13.3 g/dL (ref 11.7–15.5)
LYMPHS PCT: 31 %
Lymphs Abs: 1829 cells/uL (ref 850–3900)
MCH: 27.5 pg (ref 27.0–33.0)
MCHC: 33 g/dL (ref 32.0–36.0)
MCV: 83.4 fL (ref 80.0–100.0)
MONOS PCT: 11 %
MPV: 8.7 fL (ref 7.5–12.5)
Monocytes Absolute: 649 cells/uL (ref 200–950)
Neutro Abs: 3245 cells/uL (ref 1500–7800)
Neutrophils Relative %: 55 %
PLATELETS: 155 10*3/uL (ref 140–400)
RBC: 4.83 MIL/uL (ref 3.80–5.10)
RDW: 16 % — AB (ref 11.0–15.0)
WBC: 5.9 10*3/uL (ref 3.8–10.8)

## 2016-12-03 LAB — LIPID PANEL
Cholesterol: 105 mg/dL (ref <200)
HDL: 39 mg/dL — AB (ref >50)
LDL CALC: 37 mg/dL (ref <100)
TRIGLYCERIDES: 147 mg/dL (ref <150)
Total CHOL/HDL Ratio: 2.7 Ratio (ref <5.0)
VLDL: 29 mg/dL (ref <30)

## 2016-12-03 LAB — BASIC METABOLIC PANEL WITH GFR
BUN: 14 mg/dL (ref 7–25)
CO2: 26 mmol/L (ref 20–31)
Calcium: 9.5 mg/dL (ref 8.6–10.4)
Chloride: 102 mmol/L (ref 98–110)
Creat: 0.95 mg/dL — ABNORMAL HIGH (ref 0.60–0.93)
GFR, EST NON AFRICAN AMERICAN: 61 mL/min (ref >=60)
GFR, Est African American: 70 mL/min (ref >=60)
Glucose, Bld: 128 mg/dL — ABNORMAL HIGH (ref 65–99)
POTASSIUM: 4 mmol/L (ref 3.5–5.3)
SODIUM: 139 mmol/L (ref 135–146)

## 2016-12-03 LAB — HEPATIC FUNCTION PANEL
ALT: 22 U/L (ref 6–29)
AST: 20 U/L (ref 10–35)
Albumin: 4.2 g/dL (ref 3.6–5.1)
Alkaline Phosphatase: 56 U/L (ref 33–130)
Bilirubin, Direct: 0.2 mg/dL (ref <=0.2)
Indirect Bilirubin: 0.9 mg/dL (ref 0.2–1.2)
Total Bilirubin: 1.1 mg/dL (ref 0.2–1.2)
Total Protein: 6.5 g/dL (ref 6.1–8.1)

## 2016-12-03 LAB — TSH: TSH: 0.52 mIU/L

## 2016-12-03 NOTE — Patient Instructions (Signed)

## 2016-12-03 NOTE — Progress Notes (Signed)
This very nice 71 y.o. [name] presents for 6 month follow up with Hypertension, Hyperlipidemia, Pre-Diabetes and Vitamin D Deficiency. She also has hx/o Gout controlled on Allopurinol.     Patient is treated for HTN (1978) & BP has been controlled at home. Patient's BP's have been very labile over the last year and she has had a negative nuclear stress test and Renal Aa U/S.      Today she reports that after she takes her Atenolol 100 mg tablet every morning that frequently by lunch time her BP is below 100 and she becomes lighted.  Today's BP is at goal - 102/64. Patient has had no complaints of any cardiac type chest pain, palpitations, dyspnea/orthopnea/PND, dizziness, claudication or dependent edema.     Hyperlipidemia is controlled with diet & meds. Patient denies myalgias or other med SE's. 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  Last A1c was nearer goal: Lab Results  Component Value Date   HGBA1C 5.8 (H) 12/03/2016      Further, the patient also has history of Vitamin D Deficiency ("219" in 2008) and supplements vitamin D without any suspected side-effects. Last vitamin D was at goal: Lab Results  Component Value Date   VD25OH 59 12/03/2016   Current Outpatient Prescriptions on File Prior to Visit  Medication Sig  . aspirin 325 MG EC tablet Take 325 mg by mouth daily.  Marland Kitchen atenolol (TENORMIN) 100 MG tablet Take 1 tablet in the morning and 1 tablet at bedtime.  . Biotin 1000 MCG tablet Take 1,000 mcg by mouth daily. Reported on 08/22/2015  . Blood Glucose Monitoring Suppl (ONE TOUCH ULTRA SYSTEM KIT) w/Device KIT Check blood sugar 1 time daily-DX-E11.21    . Cholecalciferol (VITAMIN D PO) Take 10,000 Units by mouth daily.   . citalopram (CELEXA) 20 MG tablet Take 2 tablets daily for anxiety and Mood  . clonazePAM (KLONOPIN) 1 MG tablet TAKE ONE-HALF TO ONE TABLET BY MOUTH THREE TIMES DAILY OR  AS  DIRECTED FOR ANXIETY  . cloNIDine (CATAPRES) 0.2 MG tablet Take 1 tablet (0.2 mg total) by mouth 2 (two) times daily. (Patient taking differently: Take 0.2 mg by mouth 2 (two) times daily. May take one additional tablet (0.2 mg) for systolic BP > 536)  . enalapril (VASOTEC) 20 MG tablet Take 1 tablet (20 mg total) by mouth 2 (two) times daily. (Patient taking differently: Take 10-20 mg by mouth See admin instructions. 10 mg in the morning and 20 mg at bedtime)  . Flaxseed, Linseed, (FLAXSEED OIL PO) Take 1,000 mg by mouth daily. Reported on 08/22/2015  . furosemide (LASIX) 40 MG tablet TAKE 1 TABLET EVERY DAY (Patient taking differently: Take 40 mg by mouth once a day only if swelling is present in the legs)  . glucose blood test strip Check blood sugar 1 time daily-DX-E11.21. Please dispense One Touch Ultra test strips.  . Iron TABS Take 325 mg by mouth 2 (two) times daily.   . Lancets (ONETOUCH ULTRASOFT) lancets Check blood sugar 1 time daily-DX-E11.21  . metFORMIN (GLUCOPHAGE-XR) 500 MG 24 hr tablet Take 1 tablet (500  mg total) by mouth 4 (four) times daily.  . minoxidil (LONITEN) 10 MG tablet TAKE ONE TABLET BY MOUTH AT SUPPERTIME  . Omega-3 Fatty Acids (FISH OIL) 1000 MG CAPS Take 1,000 mg by mouth daily. Reported on 08/22/2015  . Red Yeast Rice 600 MG CAPS Take 600 mg by mouth daily with breakfast. Reported on 08/22/2015  . rosuvastatin (CRESTOR) 20 MG tablet TAKE ONE TABLET BY MOUTH DAILY AT 6 PM.  . verapamil (CALAN) 120 MG tablet TAKE 1 TABLET TWICE DAILY (Patient taking differently: Take 60 mg in the morning and 120 mg at bedtime)   No current facility-administered medications on file prior to visit.    Allergies  Allergen Reactions  . Lipitor  [Atorvastatin]     Elevates LFT's   PMHx:   Past Medical History:  Diagnosis Date  . Anxiety   . Arthritis   . Carpal tunnel syndrome   . Duodenal diverticulum   . Esophageal motility disorder   . Fatty liver   . GERD (gastroesophageal reflux disease)   . Hyperlipidemia   . Hypertension   . 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 Tubulovillous adenoma of colon   . Type II or unspecified type diabetes mellitus without mention of complication, not stated as uncontrolled    Immunization History  Administered Date(s) Administered  . Influenza Split 03/25/2013  . Influenza, High Dose Seasonal PF 03/15/2014, 03/07/2015, 03/27/2016  . Pneumococcal Conjugate-13 01/26/2014  . Pneumococcal Polysaccharide-23 03/14/2011  . Td 03/25/2013  . Zoster 06/03/2005   Past Surgical History:  Procedure Laterality Date  . APPENDECTOMY    . CARPAL TUNNEL RELEASE Right 1992  . CYST REMOVAL HAND    . TONSILLECTOMY  1952   FHx:    Reviewed / unchanged  SHx:    Reviewed / unchanged  Systems Review:  Constitutional: Denies fever, chills, wt changes, headaches, insomnia, fatigue, night sweats, change in appetite. Eyes: Denies redness, blurred vision, diplopia, discharge, itchy, watery eyes.  ENT: Denies discharge, congestion, post nasal drip, epistaxis, sore throat, earache, hearing loss, dental pain, tinnitus, vertigo, sinus pain, snoring.  CV: Denies chest pain, palpitations, irregular heartbeat, syncope, dyspnea, diaphoresis, orthopnea, PND, claudication or edema. Respiratory: denies cough, dyspnea, DOE, pleurisy, hoarseness, laryngitis, wheezing.  Gastrointestinal: Denies dysphagia, odynophagia, heartburn, reflux, water brash, abdominal pain or cramps, nausea, vomiting, bloating, diarrhea, constipation, hematemesis, melena, hematochezia  or hemorrhoids. Genitourinary: Denies dysuria, frequency, urgency, nocturia, hesitancy, discharge, hematuria or flank pain. Musculoskeletal: Denies arthralgias,  stiffness, jt. swelling, pain, limping or strain/sprain. Does c/o myalgias/cramps. Skin: Denies pruritus, rash, hives, warts, acne, eczema or change in skin lesion(s). Neuro: No weakness, tremor, incoordination, spasms, paresthesia or pain. Psychiatric: Denies confusion, memory loss or sensory loss. Endo: Denies change in weight, skin or hair change.  Heme/Lymph: No excessive bleeding, bruising or enlarged lymph nodes.  Physical Exam  BP 102/64   Pulse 64   Temp 97.4 F (36.3 C)   Resp 16   Ht '5\' 3"'  (1.6 m)   Wt 153 lb (69.4 kg)   BMI 27.10 kg/m   Appears well nourished, well groomed  and in no distress.  Eyes: PERRLA, EOMs, conjunctiva no swelling or erythema. Sinuses: No frontal/maxillary tenderness ENT/Mouth: EAC's clear, TM's nl w/o erythema, bulging. Nares clear w/o erythema, swelling, exudates. Oropharynx clear without erythema or exudates. Oral hygiene is good. Tongue normal, non obstructing. Hearing intact.  Neck: Supple. Thyroid nl. Car 2+/2+ without bruits, nodes or JVD. Chest: Respirations nl with BS clear & equal w/o rales, rhonchi, wheezing  or stridor.  Cor: Heart sounds normal w/ regular rate and rhythm without sig. murmurs, gallops, clicks or rubs. Peripheral pulses normal and equal  without edema.  Abdomen: Soft & bowel sounds normal. Non-tender w/o guarding, rebound, hernias, masses or organomegaly.  Lymphatics: Unremarkable.  Musculoskeletal: Full ROM all peripheral extremities, joint stability, 5/5 strength and normal gait.  Skin: Warm, dry without exposed rashes, lesions or ecchymosis apparent.  Neuro: Cranial nerves intact, reflexes equal bilaterally. Sensory-motor testing grossly intact. Tendon reflexes grossly intact.  Pysch: Alert & oriented x 3.  Insight and judgement nl & appropriate. No ideations.  Assessment and Plan:  1. Labile hypertension  - She is advised to cut her AM Atenolol 100 mg dose in 1/2 to 50 mg daily  - Continue medication, monitor  blood pressure at home.  - Continue DASH diet. Reminder to go to the ER if any CP,  SOB, nausea, dizziness, severe HA, changes vision/speech.  - CBC with Differential/Platelet - BASIC METABOLIC PANEL WITH GFR - Magnesium - TSH  2. Hyperlipidemia, mixed  - Continue diet/meds, exercise,& lifestyle modifications.  - Continue monitor periodic cholesterol/liver & renal functions   - Hepatic function panel - Lipid panel - TSH  3. 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 Vitamin D deficiency  - Continue diet, exercise, lifestyle modifications.  - Monitor appropriate labs. - Continue supplementation - VITAMIN D 25 Hydroxy   5. Hyperthyroidism  - TSH  6. Idiopathic gout  - Uric acid  7. Gastroesophageal reflux disease   8. Medication management  - CBC with Differential/Platelet - BASIC METABOLIC PANEL WITH GFR - Hepatic function panel - Magnesium - Lipid panel - TSH - Hemoglobin A1c - Insulin, random - VITAMIN D 25 Hydroxy - Uric acid  9. Muscle ache  - CK - Aldolase .      Discussed  regular exercise, BP monitoring, weight control to achieve/maintain BMI less than 25 and discussed med and SE's. Recommended labs to assess and monitor clinical status with further disposition pending results of labs. Over 30 minutes of exam, counseling, chart review was performed.

## 2016-12-04 ENCOUNTER — Encounter: Payer: Self-pay | Admitting: Internal Medicine

## 2016-12-04 LAB — MAGNESIUM: Magnesium: 1.6 mg/dL (ref 1.5–2.5)

## 2016-12-04 LAB — CK: Total CK: 95 U/L (ref 29–143)

## 2016-12-04 LAB — URIC ACID: Uric Acid, Serum: 6.3 mg/dL (ref 2.5–7.0)

## 2016-12-04 LAB — VITAMIN D 25 HYDROXY (VIT D DEFICIENCY, FRACTURES): Vit D, 25-Hydroxy: 70 ng/mL (ref 30–100)

## 2016-12-04 LAB — HEMOGLOBIN A1C
Hgb A1c MFr Bld: 5.8 % — ABNORMAL HIGH (ref <5.7)
Mean Plasma Glucose: 120 mg/dL

## 2016-12-05 LAB — ALDOLASE: ALDOLASE: 3.6 U/L (ref <=8.1)

## 2016-12-05 LAB — INSULIN, RANDOM: INSULIN: 15 u[IU]/mL (ref 2.0–19.6)

## 2017-01-02 ENCOUNTER — Encounter: Payer: Self-pay | Admitting: Neurology

## 2017-01-02 ENCOUNTER — Ambulatory Visit (INDEPENDENT_AMBULATORY_CARE_PROVIDER_SITE_OTHER): Payer: PPO | Admitting: Neurology

## 2017-01-02 VITALS — BP 115/64 | HR 66 | Ht 63.0 in | Wt 155.5 lb

## 2017-01-02 DIAGNOSIS — R42 Dizziness and giddiness: Secondary | ICD-10-CM | POA: Diagnosis not present

## 2017-01-02 DIAGNOSIS — I679 Cerebrovascular disease, unspecified: Secondary | ICD-10-CM | POA: Diagnosis not present

## 2017-01-02 HISTORY — DX: Cerebrovascular disease, unspecified: I67.9

## 2017-01-02 NOTE — Progress Notes (Signed)
Reason for visit: Dizziness  Michele [name] is an 71 y.o. female  History of present illness:  Michele [name] is a 71 year old right-handed white female with a history of cerebrovascular disease and severe hypertension. The patient also has diabetes. The patient has evidence of cerebellar and brainstem ischemic changes, she is on 5 or 6 different medications to help control her blood pressure. She complains of chronic fatigue and dizziness. This has improved some over time, but the patient oftentimes has to take a nap if she is particularly active during the day. She has had some improvement overall in her dizziness. She does have some mild gait instability, she has fallen on 2 occasions since last seen, the last fall was 2 weeks ago. Both of these falls occurred on uneven ground or wet ground, the patient did injure her right knee following the last fall. The patient overall believes that she has done better, she feels better in general. Her blood pressures have been well controlled.   Past Medical History:  Diagnosis Date  . Anxiety   . Arthritis   . Carpal tunnel syndrome   . Duodenal diverticulum   . Esophageal motility disorder   . Fatty liver   . GERD (gastroesophageal reflux disease)   . Hyperlipidemia   . Hypertension   . 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 Tubulovillous adenoma of colon   . Type II or unspecified type diabetes mellitus without mention of complication, not stated as uncontrolled     Past Surgical History:  Procedure Laterality Date  . APPENDECTOMY    . CARPAL TUNNEL RELEASE Right 1992  . CYST REMOVAL HAND    . TONSILLECTOMY  1952    Family History  Problem Relation Age of Onset  . Heart disease Mother   . Diabetes Mother   . Colon polyps Mother   . Heart disease Father   . Diabetes Father   . Heart attack Father   . Heart disease Brother   . Heart attack Brother   . Cerebral aneurysm Sister   . Heart disease Brother        Stent    Social  history:  reports that she has never smoked. She has never used smokeless tobacco. She reports that she does not drink alcohol or use drugs.    Allergies  Allergen Reactions  . Lipitor [Atorvastatin]     Elevates LFT's    Medications:  Prior to Admission medications   Medication Sig Start Date End Date Taking? Authorizing Provider  aspirin 325 MG EC tablet Take 325 mg by mouth daily.    [provider]  atenolol (TENORMIN) 100 MG tablet Take 1 tablet in the morning and 1 tablet at bedtime. 09/20/16   [name], MD  Biotin 1000 MCG tablet Take 1,000 mcg by mouth daily. Reported on 08/22/2015    [provider]  Blood Glucose Monitoring Suppl (ONE TOUCH ULTRA SYSTEM KIT) w/Device KIT Check blood sugar 1 time daily-DX-E11.21 03/27/16   [name], MD  Cholecalciferol (VITAMIN D PO) Take 10,000 Units by mouth daily.     [provider]  citalopram (CELEXA) 20 MG tablet Take 2 tablets daily for anxiety and Mood 04/09/16   [name], MD  clonazePAM (KLONOPIN) 1 MG tablet TAKE ONE-HALF TO ONE TABLET BY MOUTH THREE TIMES DAILY OR  AS  DIRECTED FOR ANXIETY 11/20/16   [name], MD  cloNIDine (CATAPRES) 0.2 MG tablet Take 1 tablet (0.2 mg total) by mouth 2 (two)  times daily. Patient taking differently: Take 0.2 mg by mouth 2 (two) times daily. May take one additional tablet (0.2 mg) for systolic BP > 779 3/90/30   Reyne Dumas, MD  enalapril (VASOTEC) 20 MG tablet Take 1 tablet (20 mg total) by mouth 2 (two) times daily. Patient taking differently: Take 10-20 mg by mouth See admin instructions. 10 mg in the morning and 20 mg at bedtime 01/22/16   [name], MD  Flaxseed, Linseed, (FLAXSEED OIL PO) Take 1,000 mg by mouth daily. Reported on 08/22/2015    [provider]  furosemide (LASIX) 40 MG tablet TAKE 1 TABLET EVERY DAY Patient taking differently: Take 40 mg by mouth once a day only if swelling is present in the legs 03/17/14    [name], MD  glucose blood test strip Check blood sugar 1 time daily-DX-E11.21. Please dispense One Touch Ultra test strips. 04/09/16   [name], MD  Iron TABS Take 325 mg by mouth 2 (two) times daily.     [provider]  Lancets Glory Rosebush ULTRASOFT) lancets Check blood sugar 1 time daily-DX-E11.21 03/27/16   [name], MD  metFORMIN (GLUCOPHAGE-XR) 500 MG 24 hr tablet Take 1 tablet (500 mg total) by mouth 4 (four) times daily. 06/11/16 12/04/17  [name], MD  minoxidil (LONITEN) 10 MG tablet TAKE ONE TABLET BY MOUTH AT SUPPERTIME 10/18/16   [name], MD  Omega-3 Fatty Acids (FISH OIL) 1000 MG CAPS Take 1,000 mg by mouth daily. Reported on 08/22/2015    [provider]  Red Yeast Rice 600 MG CAPS Take 600 mg by mouth daily with breakfast. Reported on 08/22/2015    [provider]  rosuvastatin (CRESTOR) 20 MG tablet TAKE ONE TABLET BY MOUTH DAILY AT 6 PM. 08/22/16   [name], MD  verapamil (CALAN) 120 MG tablet TAKE 1 TABLET TWICE DAILY Patient taking differently: Take 60 mg in the morning and 120 mg at bedtime 03/17/15   [name], MD    ROS:  Out of a complete 14 system review of symptoms, the patient complains only of the following symptoms, and all other reviewed systems are negative.  Fatigue Runny nose Loss of vision, cough Snoring Joint pain, walking difficulty  Blood pressure 115/64, pulse 66, height '5\' 3"'$  (1.6 m), weight 155 lb 8 oz (70.5 kg).   Blood pressure, right arm, sitting is 128/64. Blood pressure, right arm, standing is 134/70.  Physical Exam  General: The patient is alert and cooperative at the time of the examination.  Skin: No significant peripheral edema is noted.   Neurologic Exam  Mental status: The patient is alert and oriented x 3 at the time of the examination. The patient has apparent normal recent and remote memory, with an apparently normal attention span and concentration  ability.   Cranial nerves: Facial symmetry is present. Speech is minimally dysarthric, not aphasic. Extraocular movements are full. Visual fields are full. Minimal masking of the face is seen.  Motor: The patient has good strength in all 4 extremities.  Sensory examination: Soft touch sensation is symmetric on the face, arms, and legs.  Coordination: The patient has good finger-nose-finger and heel-to-shin bilaterally.  Gait and station: The patient has a normal gait. Tandem gait is slightly unsteady. Romberg is negative. No drift is seen.  Reflexes: Deep tendon reflexes are symmetric.   Assessment/Plan:  1. Cerebrovascular disease  2. Reports of chronic fatigue and dizziness  The patient is on multiple medications for blood pressure that  may cause dizziness and fatigue issues, cerebrovascular disease may also produce the symptoms. Overall, the patient is stable with her clinical examination, her blood pressures are well controlled today. I will have the patient return to this office on an as-needed basis.  Jill Alexanders MD 01/02/2017 10:01 AM  [facility] [address] [place], [place] [address]  Phone [phone] Fax [phone]

## 2017-01-08 NOTE — Progress Notes (Signed)
MEDICARE ANNUAL WELLNESS VISIT AND FOLLOW UP  Assessment:   Essential hypertension - continue medications, DASH diet, exercise and monitor at home. Call if greater than 130/80.  -     CBC with Differential/Platelet -     BASIC METABOLIC PANEL WITH GFR -     Hepatic function panel -     TSH  Cerebrovascular disease Control blood pressure, cholesterol, glucose, increase exercise.   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 -     Hemoglobin A1c  Hyperthyroidism check TSH level, -     TSH  Mixed hyperlipidemia -continue medications, check lipids, decrease fatty foods, increase activity.  -     Lipid panel  Medication management -     Magnesium  Anemia, unspecified type - monitor, continue iron supp with Vitamin C and increase green leafy veggies -     CBC with Differential/Platelet  Vitamin D deficiency Continue supplement  COLONIC POLYPS, ADENOMATOUS, HX OF Follow up  Idiopathic gout, unspecified chronicity, unspecified site Gout- recheck Uric acid as needed, Diet discussed, continue medications.  Advanced care counseling/discussion Discussed advanced care planning with patient  Encounter for Medicare annual wellness exam  Right knee pain, unspecified chronicity -     Ambulatory referral to Orthopedics - s/p fall - pain at medial joint line - avoid NSAIDS, will refer to ortho for evaluation.    Over 30 minutes of exam, counseling, chart review, and critical decision making was performed  Plan:   During the course of the visit the patient was  educated and counseled about appropriate screening and preventive services including:    Pneumococcal vaccine   Influenza vaccine  Td vaccine  Prevnar 13  Screening electrocardiogram  Screening mammography  Bone densitometry screening  Colorectal cancer screening  Diabetes screening  Glaucoma screening  Nutrition counseling   Advanced directives: given info/requested copies   Subjective:   Michele [name] is a 71 y.o. female who presents for Medicare Annual Wellness Visit and 3 month follow up on hypertension, prediabetes, hyperlipidemia, vitamin D def.   Has had multiple falls, but fell two weeks ago, was slipping on wet grass and fell on right side, having right knee pain.  Her blood pressure has been controlled at home, today their BP is BP: 120/68 She does not workout. She denies chest pain, shortness of breath, dizziness. Will occ get low BP's still more so in the AM. Medial knee pain.  She has seen Dr. Jannifer Franklin for previous CVA, she is released and will be seen as needed.  She is on cholesterol medication and denies myalgias. Her cholesterol is not at goal. The cholesterol last visit was:   Lab Results  Component Value Date   CHOL 105 12/03/2016   HDL 39 (L) 12/03/2016   LDLCALC 37 12/03/2016   TRIG 147 12/03/2016   CHOLHDL 2.7 12/03/2016   She has been working on diet and exercise for prediabetes, and denies foot ulcerations, hyperglycemia, hypoglycemia , increased appetite, nausea, paresthesia of the feet, polydipsia, polyuria, visual disturbances, vomiting and weight loss. Last A1C in the office was:  Lab Results  Component Value Date   HGBA1C 5.8 (H) 12/03/2016  Last GFR Lab Results  Component Value Date   GFRNONAA 61 12/03/2016   Patient is on Vitamin D supplement. Lab Results  Component Value Date   VD25OH 34 12/03/2016     She is on thyroid medication. Her medication was not changed last visit.   Lab Results  Component Value Date   TSH 0.52  12/03/2016  .  BMI is Body mass index is 26.43 kg/m., she is working on diet and exercise. Wt Readings from Last 3 Encounters:  01/09/17 149 lb 3.2 oz (67.7 kg)  01/02/17 155 lb 8 oz (70.5 kg)  12/03/16 153 lb (69.4 kg)     Medication Review Current Outpatient Prescriptions on File Prior to Visit  Medication Sig Dispense Refill  . aspirin 325 MG EC tablet Take 325 mg by mouth daily.    Marland Kitchen atenolol (TENORMIN) 100 MG tablet Take 1 tablet in the morning and 1 tablet at bedtime. (Patient taking differently: Take 50 mg by mouth 2 (two) times daily. Take 1 tablet in the morning and 1 tablet at bedtime.) 180 tablet 1  . Biotin 1000 MCG tablet Take 1,000 mcg by mouth daily. Reported on 08/22/2015    . Blood Glucose Monitoring Suppl (ONE TOUCH ULTRA SYSTEM KIT) w/Device KIT Check blood sugar 1 time daily-DX-E11.21 1 each 0  . Cholecalciferol (VITAMIN D PO) Take 10,000 Units by mouth daily.     . citalopram (CELEXA) 20 MG tablet Take 2 tablets daily for anxiety and Mood 90 tablet 3  . clonazePAM (KLONOPIN) 1 MG tablet TAKE ONE-HALF TO ONE TABLET BY MOUTH THREE TIMES DAILY OR  AS  DIRECTED FOR ANXIETY (Patient taking differently: 1 tablet po at bed) 90 tablet 0  . cloNIDine (CATAPRES) 0.2 MG tablet Take 1 tablet (0.2 mg total) by mouth 2 (two) times daily. (Patient taking differently: Take 0.2 mg by mouth daily. May take one additional tablet (0.2 mg) for systolic BP > 767) 60 tablet 11  . enalapril (VASOTEC) 20 MG tablet Take 1 tablet (20 mg total) by mouth 2 (two) times daily. (Patient taking differently: Take 10-20 mg by mouth See admin instructions. 10 mg in the morning and 20 mg at bedtime) 180 tablet 4  . Flaxseed, Linseed, (FLAXSEED OIL PO) Take 1,000 mg by mouth daily. Reported on 08/22/2015    . furosemide (LASIX) 40 MG tablet TAKE 1 TABLET EVERY DAY (Patient taking differently: Take 40 mg by mouth once a day only if swelling is present in the legs as needed) 90 tablet 99  . glucose blood test  strip Check blood sugar 1 time daily-DX-E11.21. Please dispense One Touch Ultra test strips. 100 each 1  . Iron TABS Take 325 mg by mouth 2 (two) times daily.     . Lancets (ONETOUCH ULTRASOFT) lancets Check blood sugar 1 time daily-DX-E11.21 100 each 1  . Magnesium 500 MG TABS Take 1 tablet by mouth 3 (three) times daily.    . metFORMIN (GLUCOPHAGE-XR) 500 MG 24 hr tablet Take 1 tablet (500 mg total) by mouth 4 (four) times daily. 360 tablet 1  . minoxidil (LONITEN) 10 MG tablet TAKE ONE TABLET BY MOUTH AT SUPPERTIME 90 tablet 1  . Omega-3 Fatty Acids (FISH OIL) 1000 MG CAPS Take 1,000 mg by mouth daily. Reported on 08/22/2015    . Red Yeast Rice 600 MG CAPS Take 600 mg by mouth daily with breakfast. Reported on 08/22/2015    . rosuvastatin (CRESTOR) 20 MG tablet TAKE ONE TABLET BY MOUTH DAILY  AT 6 PM. (Patient taking differently: TAKE half TABLET BY MOUTH DAILY AT 6 PM.) 90 tablet 1  . verapamil (CALAN) 120 MG tablet TAKE 1 TABLET TWICE DAILY (Patient taking differently: Takes 1 tablet daily) 180 tablet 1   No current facility-administered medications on file prior to visit.     Current Problems (verified) Patient Active Problem List   Diagnosis Date Noted  . Cerebrovascular disease 01/02/2017  . Dizziness and giddiness 02/16/2016  . Myalgia and myositis 02/16/2016  . Hyperthyroidism 09/11/2015  . Diabetes mellitus ([place]) 08/23/2015  . Headache   . Gout 09/22/2014  . Vitamin D deficiency 06/28/2013  . Medication management 06/28/2013  . Anemia 06/28/2013  . 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 Mixed hyperlipidemia 03/21/2008  . Essential hypertension 03/21/2008  . GERD 03/21/2008  . Fatty liver 03/21/2008  . COLONIC POLYPS, ADENOMATOUS, HX OF 03/21/2008    Screening Tests Immunization History  Administered Date(s) Administered  . Influenza Split 03/25/2013  . Influenza, High Dose Seasonal PF 03/15/2014, 03/07/2015, 03/27/2016  . Pneumococcal Conjugate-13 01/26/2014  .  Pneumococcal Polysaccharide-23 03/14/2011  . Td 03/25/2013  . Zoster 06/03/2005    Preventative care: Last colonoscopy: 2014 Last mammogram: 02/01/2016 [id number]  Prior vaccinations: TD or Tdap: 2014  Influenza: 2017  Pneumococcal: 2012 Prevnar13: 2014 Shingles/Zostavax: 2007  Names of Other Physician/Practitioners you currently use: 1. [facility]- here for primary care 2. Dr. Ellie Lunch, eye doctor, last visit 07/2016 3. Dr. Illene Bolus, dentist, last visit 2018 q 6 months Patient Care Team: [name], MD as PCP - General (Internal Medicine)  Allergies Allergies  Allergen Reactions  . Lipitor [Atorvastatin]     Elevates LFT's    SURGICAL HISTORY She  has a past surgical history that includes Appendectomy; Tonsillectomy (1952); Carpal tunnel release (Right, 1992); and Cyst removal hand. FAMILY HISTORY Her family history includes Cerebral aneurysm in her sister; Colon polyps in her mother; Diabetes in her father and mother; Heart attack in her brother and father; Heart disease in her brother, brother, father, and mother. SOCIAL HISTORY She  reports that she has never smoked. She has never used smokeless tobacco. She reports that she does not drink alcohol or use drugs.  MEDICARE WELLNESS OBJECTIVES: Physical activity: Current Exercise Habits: The patient does not participate in regular exercise at present (does yard work) Cardiac risk factors: Cardiac Risk Factors include: advanced age (>51mn, >>54women);family history of premature cardiovascular disease;hypertension;dyslipidemia Depression/mood screen:   [facility] 01[facility]2017  Decreased Interest 0  Down, Depressed, Hopeless 0  PHQ - 2 Score 0    ADLs:  In your present state of health, do you have any difficulty performing the following activities: 01/09/2017 12/04/2016  Hearing? N N  Vision? N N  Difficulty concentrating or making decisions? N N  Walking or  climbing stairs? N N  Dressing or bathing? N N  Doing errands, shopping? N N  Some recent data might be hidden     Cognitive Testing  Alert? Yes  Normal Appearance?Yes  Oriented to person? Yes  Place? Yes   Time? Yes  Recall of three objects?  Yes  Can perform simple calculations? Yes  Displays appropriate judgment?Yes  Can read the correct time from a watch face?Yes  EOL planning: Does Patient Have a Medical Advance Directive?: Yes Type of Advance Directive: Healthcare Power of Attorney, Living will Copy of [place] Chart?: No - copy requested   Objective:  Today's Vitals   01/09/17 1500  BP: 120/68  Pulse: 72  Resp: 14  SpO2: 95%  Weight: 149 lb 3.2 oz (67.7 kg)  Height: '5\' 3"'  (1.6 m)  PainSc: 8   PainLoc: Hip   Body mass index is 26.43 kg/m.  General appearance: alert, no distress, WD/WN,  female HEENT: normocephalic, sclerae anicteric, TMs pearly, nares patent, no discharge or erythema, pharynx normal Oral cavity: MMM, no lesions Neck: supple, no lymphadenopathy, no thyromegaly, no masses Heart: RRR, normal S1, S2, no murmurs Lungs: CTA bilaterally, no wheezes, rhonchi, or rales Abdomen: +bs, soft, non tender, non distended, no masses, no hepatomegaly, no splenomegaly Musculoskeletal: nontender, no swelling, no obvious deformity Extremities: no edema, no cyanosis, no clubbing, FROM right hip, + pain to palpation, + pain to palpation right medial knee joint line, + crepitus, no laxity, good distal vascular.  Pulses: 2+ symmetric, upper and lower extremities, normal cap refill Neurological: alert, oriented x 3, CN2-12 intact, strength normal upper extremities and lower extremities, sensation normal throughout, DTRs 2+ throughout, no cerebellar signs, gait normal Psychiatric: normal affect, behavior normal, pleasant  Breast: defer Gyn: defer Rectal: defer  Medicare Attestation I have personally reviewed: The patient's medical and social  history Their use of alcohol, tobacco or illicit drugs Their current medications and supplements The patient's functional ability including ADLs,fall risks, home safety risks, cognitive, and hearing and visual impairment Diet and physical activities Evidence for depression or mood disorders  The patient's weight, height, BMI, and visual acuity have been recorded in the chart.  I have made referrals, counseling, and provided education to the patient based on review of the above and I have provided the patient with a written personalized care plan for preventive services.     Vicie Mutters, PA-C   01/09/2017

## 2017-01-09 ENCOUNTER — Ambulatory Visit (INDEPENDENT_AMBULATORY_CARE_PROVIDER_SITE_OTHER): Payer: PPO | Admitting: Physician Assistant

## 2017-01-09 ENCOUNTER — Encounter: Payer: Self-pay | Admitting: Physician Assistant

## 2017-01-09 VITALS — BP 120/68 | HR 72 | Resp 14 | Ht 63.0 in | Wt 149.2 lb

## 2017-01-09 DIAGNOSIS — Z0001 Encounter for general adult medical examination with abnormal findings: Secondary | ICD-10-CM

## 2017-01-09 DIAGNOSIS — E782 Mixed hyperlipidemia: Secondary | ICD-10-CM | POA: Diagnosis not present

## 2017-01-09 DIAGNOSIS — Z860100 Personal history of colon polyps, unspecified: Secondary | ICD-10-CM

## 2017-01-09 DIAGNOSIS — M1000 Idiopathic gout, unspecified site: Secondary | ICD-10-CM

## 2017-01-09 DIAGNOSIS — K760 Fatty (change of) liver, not elsewhere classified: Secondary | ICD-10-CM

## 2017-01-09 DIAGNOSIS — Z7189 Other specified counseling: Secondary | ICD-10-CM

## 2017-01-09 DIAGNOSIS — I10 Essential (primary) hypertension: Secondary | ICD-10-CM

## 2017-01-09 DIAGNOSIS — D649 Anemia, unspecified: Secondary | ICD-10-CM | POA: Diagnosis not present

## 2017-01-09 DIAGNOSIS — I679 Cerebrovascular disease, unspecified: Secondary | ICD-10-CM | POA: Diagnosis not present

## 2017-01-09 DIAGNOSIS — Z79899 Other long term (current) drug therapy: Secondary | ICD-10-CM

## 2017-01-09 DIAGNOSIS — R6889 Other general symptoms and signs: Secondary | ICD-10-CM | POA: Diagnosis not present

## 2017-01-09 DIAGNOSIS — E0590 Thyrotoxicosis, unspecified without thyrotoxic crisis or storm: Secondary | ICD-10-CM

## 2017-01-09 DIAGNOSIS — E1121 Type 2 diabetes mellitus with diabetic nephropathy: Secondary | ICD-10-CM | POA: Diagnosis not present

## 2017-01-09 DIAGNOSIS — M25561 Pain in right knee: Secondary | ICD-10-CM

## 2017-01-09 DIAGNOSIS — E559 Vitamin D deficiency, unspecified: Secondary | ICD-10-CM | POA: Diagnosis not present

## 2017-01-09 DIAGNOSIS — K219 Gastro-esophageal reflux disease without esophagitis: Secondary | ICD-10-CM

## 2017-01-09 DIAGNOSIS — Z0000 Encounter for general adult medical examination without abnormal findings: Secondary | ICD-10-CM

## 2017-01-09 LAB — CBC WITH DIFFERENTIAL/PLATELET
BASOS ABS: 0 {cells}/uL (ref 0–200)
Basophils Relative: 0 %
EOS ABS: 204 {cells}/uL (ref 15–500)
EOS PCT: 3 %
HCT: 42.7 % (ref 35.0–45.0)
Hemoglobin: 14.3 g/dL (ref 11.7–15.5)
Lymphocytes Relative: 31 %
Lymphs Abs: 2108 cells/uL (ref 850–3900)
MCH: 28 pg (ref 27.0–33.0)
MCHC: 33.5 g/dL (ref 32.0–36.0)
MCV: 83.7 fL (ref 80.0–100.0)
MPV: 8.7 fL (ref 7.5–12.5)
Monocytes Absolute: 884 cells/uL (ref 200–950)
Monocytes Relative: 13 %
NEUTROS ABS: 3604 {cells}/uL (ref 1500–7800)
Neutrophils Relative %: 53 %
Platelets: 218 10*3/uL (ref 140–400)
RBC: 5.1 MIL/uL (ref 3.80–5.10)
RDW: 15.2 % — ABNORMAL HIGH (ref 11.0–15.0)
WBC: 6.8 10*3/uL (ref 3.8–10.8)

## 2017-01-09 NOTE — Patient Instructions (Addendum)
You can take tylenol (500mg ) or tylenol arthritis (650mg ) with the meloxicam/antiinflammatories. The max you can take of tylenol a day is 3000mg  daily, this is a max of 6 pills a day of the regular tyelnol (500mg ) or a max of 4 a day of the tylenol arthritis (650mg ) as long as no other medications you are taking contain tylenol.    Your ears and sinuses are connected by the eustachian tube. When your sinuses are inflamed, this can close off the tube and cause fluid to collect in your middle ear. This can then cause dizziness, popping, clicking, ringing, and echoing in your ears. This is often NOT an infection and does NOT require antibiotics, it is caused by inflammation so the treatments help the inflammation. This can take a long time to get better so please be patient.  Here are things you can do to help with this: - Try the Flonase or Nasonex. Remember to spray each nostril twice towards the outer part of your eye.  Do not sniff but instead pinch your nose and tilt your head back to help the medicine get into your sinuses.  The best time to do this is at bedtime.Stop if you get blurred vision or nose bleeds.  -While drinking fluids, pinch and hold nose close and swallow, to help open eustachian tubes to drain fluid behind ear drums. -Please pick one of the over the counter allergy medications below and take it once daily for allergies.  It will also help with fluid behind ear drums. Claritin or loratadine cheapest but likely the weakest  Zyrtec or certizine at night because it can make you sleepy The strongest is allegra or fexafinadine  Cheapest at [facility], [employer], [employer] -can use decongestant over the counter, please do not use if you have high blood pressure or certain heart conditions.   if worsening HA, changes vision/speech, imbalance, weakness go to the ER  HOW TO TREAT VIRAL COUGH AND COLD SYMPTOMS:  -Symptoms usually last at least 1 week with the worst symptoms being around day 4.  -  colds usually start with a sore throat and end with a cough, and the cough can take 2 weeks to get better.  -No antibiotics are needed for colds, flu, sore throats, cough, bronchitis UNLESS symptoms are longer than 7 days OR if you are getting better then get drastically worse.  -There are a lot of combination medications (Dayquil, Nyquil, Vicks 44, tyelnol cold and sinus, ETC). Please look at the ingredients on the back so that you are treating the correct symptoms and not doubling up on medications/ingredients.    Medicines you can use  Nasal congestion  - pseudoephedrine (Sudafed)- behind the counter, do not use if you have high blood pressure, medicine that have -D in them.  - phenylephrine (Sudafed PE) -Dextormethorphan + chlorpheniramine (Coridcidin HBP)- okay if you have high blood pressure -Oxymetazoline (Afrin) nasal spray- LIMIT to 3 days -Saline nasal spray -Neti pot (used distilled or bottled water)  Ear pain/congestion  -pseudoephedrine (sudafed) - Nasonex/flonase nasal spray  Fever  -Acetaminophen (Tyelnol) -Ibuprofen (Advil, motrin, aleve)  Sore Throat  -Acetaminophen (Tyelnol) -Ibuprofen (Advil, motrin, aleve) -Drink a lot of water -Gargle with salt water - Rest your voice (don't talk) -Throat sprays -Cough drops  Body Aches  -Acetaminophen (Tyelnol) -Ibuprofen (Advil, motrin, aleve)  Headache  -Acetaminophen (Tyelnol) -Ibuprofen (Advil, motrin, aleve) - Exedrin, Exedrin Migraine  Allergy symptoms (cough, sneeze, runny nose, itchy eyes) -Claritin or loratadine cheapest but likely the weakest  -  Zyrtec or certizine at night because it can make you sleepy -The strongest is allegra or fexafinadine  Cheapest at [employer], [employer], [employer]  Cough  -Dextromethorphan (Delsym)- medicine that has DM in it -Guafenesin (Mucinex/Robitussin) - cough drops - drink lots of water  Chest Congestion  -Guafenesin (Mucinex/Robitussin)  Red Itchy Eyes  -  Naphcon-A  Upset Stomach  - Bland diet (nothing spicy, greasy, fried, and high acid foods like tomatoes, oranges, berries) -OKAY- cereal, bread, soup, crackers, rice -Eat smaller more frequent meals -reduce caffeine, no alcohol -Loperamide (Imodium-AD) if diarrhea -Prevacid for heart burn  General health when sick  -Hydration -wash your hands frequently -keep surfaces clean -change pillow cases and sheets often -Get fresh air but do not exercise strenuously -Vitamin D, double up on it - Vitamin C -Zinc     Fatigue Fatigue is feeling tired all of the time, a lack of energy, or a lack of motivation. Occasional or mild fatigue is often a normal response to activity or life in general. However, long-lasting (chronic) or extreme fatigue may indicate an underlying medical condition. Follow these instructions at home: Watch your fatigue for any changes. The following actions may help to lessen any discomfort you are feeling:  Talk to your health care provider about how much sleep you need each night. Try to get the required amount every night.  Take medicines only as directed by your health care provider.  Eat a healthy and nutritious diet. Ask your health care provider if you need help changing your diet.  Drink enough fluid to keep your urine clear or pale yellow.  Practice ways of relaxing, such as yoga, meditation, massage therapy, or acupuncture.  Exercise regularly.  Change situations that cause you stress. Try to keep your work and personal routine reasonable.  Do not abuse illegal drugs.  Limit alcohol intake to no more than 1 drink per day for nonpregnant women and 2 drinks per day for men. One drink equals 12 ounces of beer, 5 ounces of wine, or 1 ounces of hard liquor.  Take a multivitamin, if directed by your health care provider.  Contact a health care provider if:  Your fatigue does not get better.  You have a fever.  You have unintentional weight loss or  gain.  You have headaches.  You have difficulty: ? Falling asleep. ? Sleeping throughout the night.  You feel angry, guilty, anxious, or sad.  You are unable to have a bowel movement (constipation).  You skin is dry.  Your legs or another part of your body is swollen. Get help right away if:  You feel confused.  Your vision is blurry.  You feel faint or pass out.  You have a severe headache.  You have severe abdominal, pelvic, or back pain.  You have chest pain, shortness of breath, or an irregular or fast heartbeat.  You are unable to urinate or you urinate less than normal.  You develop abnormal bleeding, such as bleeding from the rectum, vagina, nose, lungs, or nipples.  You vomit blood.  You have thoughts about harming yourself or committing suicide.  You are worried that you might harm someone else. This information is not intended to replace advice given to you by your health care provider. Make sure you discuss any questions you have with your health care provider. Document Released: 03/17/2007 Document Revised: 10/26/2015 Document Reviewed: 09/21/2013 Elsevier Interactive Patient Education  [employer].

## 2017-01-10 ENCOUNTER — Other Ambulatory Visit: Payer: Self-pay | Admitting: Physician Assistant

## 2017-01-10 DIAGNOSIS — M25551 Pain in right hip: Secondary | ICD-10-CM

## 2017-01-10 DIAGNOSIS — M25511 Pain in right shoulder: Secondary | ICD-10-CM

## 2017-01-10 LAB — LIPID PANEL
CHOLESTEROL: 136 mg/dL (ref <200)
HDL: 40 mg/dL — AB (ref >50)
LDL Cholesterol: 50 mg/dL (ref <100)
TRIGLYCERIDES: 230 mg/dL — AB (ref <150)
Total CHOL/HDL Ratio: 3.4 Ratio (ref <5.0)
VLDL: 46 mg/dL — ABNORMAL HIGH (ref <30)

## 2017-01-10 LAB — TSH: TSH: 0.82 mIU/L

## 2017-01-10 LAB — BASIC METABOLIC PANEL WITH GFR
BUN: 24 mg/dL (ref 7–25)
CHLORIDE: 97 mmol/L — AB (ref 98–110)
CO2: 26 mmol/L (ref 20–32)
CREATININE: 1.13 mg/dL — AB (ref 0.60–0.93)
Calcium: 11.3 mg/dL — ABNORMAL HIGH (ref 8.6–10.4)
GFR, Est African American: 57 mL/min — ABNORMAL LOW (ref >=60)
GFR, Est Non African American: 49 mL/min — ABNORMAL LOW (ref >=60)
Glucose, Bld: 97 mg/dL (ref 65–99)
Potassium: 4.5 mmol/L (ref 3.5–5.3)
SODIUM: 138 mmol/L (ref 135–146)

## 2017-01-10 LAB — HEPATIC FUNCTION PANEL
ALK PHOS: 66 U/L (ref 33–130)
ALT: 36 U/L — AB (ref 6–29)
AST: 34 U/L (ref 10–35)
Albumin: 4.6 g/dL (ref 3.6–5.1)
BILIRUBIN DIRECT: 0.3 mg/dL — AB (ref <=0.2)
Indirect Bilirubin: 1.1 mg/dL (ref 0.2–1.2)
Total Bilirubin: 1.4 mg/dL — ABNORMAL HIGH (ref 0.2–1.2)
Total Protein: 7.1 g/dL (ref 6.1–8.1)

## 2017-01-10 LAB — MAGNESIUM: Magnesium: 2 mg/dL (ref 1.5–2.5)

## 2017-01-10 LAB — HEMOGLOBIN A1C
Hgb A1c MFr Bld: 5.9 % — ABNORMAL HIGH (ref <5.7)
Mean Plasma Glucose: 123 mg/dL

## 2017-01-27 ENCOUNTER — Ambulatory Visit (INDEPENDENT_AMBULATORY_CARE_PROVIDER_SITE_OTHER): Payer: PPO

## 2017-01-27 ENCOUNTER — Ambulatory Visit (INDEPENDENT_AMBULATORY_CARE_PROVIDER_SITE_OTHER): Payer: PPO | Admitting: Physician Assistant

## 2017-01-27 ENCOUNTER — Encounter (INDEPENDENT_AMBULATORY_CARE_PROVIDER_SITE_OTHER): Payer: Self-pay | Admitting: Physician Assistant

## 2017-01-27 VITALS — Ht 63.0 in | Wt 158.0 lb

## 2017-01-27 DIAGNOSIS — M545 Low back pain: Secondary | ICD-10-CM | POA: Diagnosis not present

## 2017-01-27 DIAGNOSIS — M542 Cervicalgia: Secondary | ICD-10-CM

## 2017-01-27 MED ORDER — METHYLPREDNISOLONE 4 MG PO TABS: ORAL_TABLET | ORAL | 0 refills | Status: DC

## 2017-01-27 NOTE — Progress Notes (Signed)
Office Visit Note   Patient: Michele [name]           Date of Birth: 27-Apr-1946           MRN: [id number] Visit Date: 01/27/2017              Requested by: [name], MD [address] [place], [place] [address] PCP: [name], MD   Assessment & Plan: Visit Diagnoses:  1. Neck pain   2. Acute right-sided low back pain, with sciatica presence unspecified     Plan: We will place her on Medrol Dosepak no NSAIDs while on a Medrol Dosepak. She can take some Tylenol. She is to be mindful of her glucose levels. We'll also send her physical therapy for neck and back. She'll follow with [place] in a month check progress lack of. Questions encouraged and answered.  Follow-Up Instructions: Return in about 4 weeks (around 02/24/2017).   Orders:  Orders Placed This Encounter  Procedures  . XR Cervical Spine 2 or 3 views  . XR Lumbar Spine 2-3 Views  . XR Pelvis 1-2 Views   Meds ordered this encounter  Medications  . methylPREDNISolone (MEDROL) 4 MG tablet    Sig: Take as directed    Dispense:  21 tablet    Refill:  0      Procedures: No procedures performed   Clinical Data: No additional findings.   Subjective: Chief Complaint  Patient presents with  . Right Shoulder - Pain  . Neck - Pain  . Lower Back - Pain  . Right Knee - Pain    HPI [name]  Is a 71 year old female with right sided back pain and right neck and arm pain. She states that she fell backwards 3-4 weeks ago while using 4 gallon back pack sprayer. She states that she is having right shoulder neck pain no numbness or tingling down the arm. She has had some previous neck pain but the pain is much more intense since the fall. She states she does have some balance issues and has had some mini strokes. She denies any loss of consciousness or dizziness at the time of fall. She is diabetic and reports good control with hemoglobin A1c of 6.4. Low back pain for at least 10 years. She  states she's been told that she has arthritis in her back. She's had no treatment other than tramadol for this in the past. She has numbness lateral aspect of her thigh down to her right knee. She denies any waking bowel or bladder changes. However she does suffer from some urinary incontinence.  Review of Systems No chest pain, shortness breath,  Fevers, chills, or loss of consciousness. Periodic dizziness.  Objective: Vital Signs: Ht 5\' 3"  (1.6 m)   Wt 158 lb (71.7 kg)   BMI 27.99 kg/m   Physical Exam  Constitutional: She is oriented to person, place, and time. She appears well-developed and well-nourished. No distress.  Pulmonary/Chest: Effort normal.  Neurological: She is alert and oriented to person, place, and time.  Skin: She is not diaphoretic.  Psychiatric: She has a normal mood and affect. Her behavior is normal.    Ortho Exam Bilateral shoulders she has 5 out of 5 strengths throughout. Negative impingement testing. Tenderness medial border of the right scapula. She has good flexion and extension cervical spine. Positive Spurling's. Deep tendon reflexes are 2+ at the biceps 1+ at the triceps and brachial radialis bilaterally. Sensation intact bilateral hands.  Full motor bilateral hands. Radial pulses are 2+ bilaterally. Lumbar spine she has good flexion and extension without pain. Positive straight leg raise on the right negative on the left. 5 strength throughout lower extremities against resistance. Sensation grossly intact feet and dorsal pedal pulses present bilaterally. Range of motion bilateral hips without pain. Nontender over the trochanteric region of both hips Specialty Comments:  No specialty comments available.  Imaging: Xr Cervical Spine 2 Or 3 Views  Result Date: 01/27/2017 Cervical spine 2 views: Loss of normal lordotic curvature. Disc space well-maintained. No spondylolisthesis or listhesis. No acute fractures or bony abnormalities  Xr Lumbar Spine 2-3  Views  Result Date: 01/27/2017 Lumbar spine 2 views Disc space well maintained. Normal lordotic curvature. No acute fractures bony abnormalities. No spondylolisthesis  Xr Pelvis 1-2 Views  Result Date: 01/27/2017 AP pelvis: Bilateral hips well located. Hip joints are well maintained. No acute fractures. Pelvis no acute fracture    PMFS History: Patient Active Problem List   Diagnosis Date Noted  . Cerebrovascular disease 01/02/2017  . Dizziness and giddiness 02/16/2016  . Myalgia and myositis 02/16/2016  . Hyperthyroidism 09/11/2015  . Diabetes mellitus ([place]) 08/23/2015  . Headache   . Gout 09/22/2014  . Vitamin D deficiency 06/28/2013  . Medication management 06/28/2013  . Anemia 06/28/2013  . 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 Mixed hyperlipidemia 03/21/2008  . Essential hypertension 03/21/2008  . GERD 03/21/2008  . Fatty liver 03/21/2008  . COLONIC POLYPS, ADENOMATOUS, HX OF 03/21/2008   Past Medical History:  Diagnosis Date  . Anxiety   . Arthritis   . Carpal tunnel syndrome   . Cerebrovascular disease 01/02/2017  . Duodenal diverticulum   . Esophageal motility disorder   . Fatty liver   . GERD (gastroesophageal reflux disease)   . Hyperlipidemia   . Hypertension   . 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 Tubulovillous adenoma of colon   . Type II or unspecified type diabetes mellitus without mention of complication, not stated as uncontrolled     Family History  Problem Relation Age of Onset  . Heart disease Mother   . Diabetes Mother   . Colon polyps Mother   . Heart disease Father   . Diabetes Father   . Heart attack Father   . Heart disease Brother   . Heart attack Brother   . Cerebral aneurysm Sister   . Heart disease Brother        Stent    Past Surgical History:  Procedure Laterality Date  . APPENDECTOMY    . CARPAL TUNNEL RELEASE Right 1992  . CYST REMOVAL HAND    . TONSILLECTOMY  1952   Social History   Occupational History  . Retired  [place] Topics  . Smoking status: Never Smoker  . Smokeless tobacco: Never Used  . Alcohol use No  . Drug use: No  . Sexual activity: No     Comment: Married

## 2017-02-11 ENCOUNTER — Other Ambulatory Visit: Payer: Self-pay | Admitting: Internal Medicine

## 2017-02-11 NOTE — Telephone Encounter (Signed)
Please call Clonazepam

## 2017-02-17 ENCOUNTER — Ambulatory Visit: Payer: Self-pay | Admitting: Internal Medicine

## 2017-02-18 ENCOUNTER — Other Ambulatory Visit: Payer: Self-pay

## 2017-02-18 MED ORDER — CLONIDINE HCL 0.2 MG PO TABS: 0.2000 mg | ORAL_TABLET | Freq: Two times a day (BID) | ORAL | 11 refills | Status: DC

## 2017-02-24 ENCOUNTER — Ambulatory Visit (INDEPENDENT_AMBULATORY_CARE_PROVIDER_SITE_OTHER): Payer: Self-pay | Admitting: Physician Assistant

## 2017-02-24 ENCOUNTER — Encounter (INDEPENDENT_AMBULATORY_CARE_PROVIDER_SITE_OTHER): Payer: Self-pay

## 2017-03-05 ENCOUNTER — Other Ambulatory Visit: Payer: Self-pay | Admitting: Internal Medicine

## 2017-03-05 DIAGNOSIS — Z1231 Encounter for screening mammogram for malignant neoplasm of breast: Secondary | ICD-10-CM

## 2017-03-19 ENCOUNTER — Ambulatory Visit: Payer: Self-pay | Admitting: Internal Medicine

## 2017-03-24 ENCOUNTER — Ambulatory Visit
Admission: RE | Admit: 2017-03-24 | Discharge: 2017-03-24 | Disposition: A | Discharge status: Home / Self Care | Payer: PPO | Source: Ambulatory Visit | Attending: Internal Medicine | Admitting: Internal Medicine

## 2017-03-24 DIAGNOSIS — Z1231 Encounter for screening mammogram for malignant neoplasm of breast: Secondary | ICD-10-CM | POA: Diagnosis not present

## 2017-03-27 ENCOUNTER — Other Ambulatory Visit: Payer: Self-pay | Admitting: *Deleted

## 2017-03-27 MED ORDER — GLUCOSE BLOOD VI STRP: ORAL_STRIP | 1 refills | Status: DC

## 2017-04-11 ENCOUNTER — Other Ambulatory Visit: Payer: Self-pay | Admitting: Internal Medicine

## 2017-04-11 DIAGNOSIS — R0989 Other specified symptoms and signs involving the circulatory and respiratory systems: Secondary | ICD-10-CM

## 2017-04-14 ENCOUNTER — Other Ambulatory Visit: Payer: Self-pay | Admitting: Internal Medicine

## 2017-04-21 ENCOUNTER — Encounter: Payer: Self-pay | Admitting: Internal Medicine

## 2017-04-21 ENCOUNTER — Other Ambulatory Visit: Payer: Self-pay | Admitting: *Deleted

## 2017-04-21 ENCOUNTER — Ambulatory Visit: Payer: PPO | Admitting: Internal Medicine

## 2017-04-21 VITALS — BP 78/44 | HR 44 | Temp 97.4°F | Resp 16 | Ht 64.0 in | Wt 154.6 lb

## 2017-04-21 DIAGNOSIS — Z23 Encounter for immunization: Secondary | ICD-10-CM | POA: Diagnosis not present

## 2017-04-21 DIAGNOSIS — I10 Essential (primary) hypertension: Secondary | ICD-10-CM

## 2017-04-21 DIAGNOSIS — E0590 Thyrotoxicosis, unspecified without thyrotoxic crisis or storm: Secondary | ICD-10-CM

## 2017-04-21 DIAGNOSIS — E1121 Type 2 diabetes mellitus with diabetic nephropathy: Secondary | ICD-10-CM

## 2017-04-21 DIAGNOSIS — Z79899 Other long term (current) drug therapy: Secondary | ICD-10-CM

## 2017-04-21 DIAGNOSIS — E559 Vitamin D deficiency, unspecified: Secondary | ICD-10-CM | POA: Diagnosis not present

## 2017-04-21 DIAGNOSIS — E782 Mixed hyperlipidemia: Secondary | ICD-10-CM | POA: Diagnosis not present

## 2017-04-21 MED ORDER — ENALAPRIL MALEATE 20 MG PO TABS: 20.0000 mg | ORAL_TABLET | Freq: Two times a day (BID) | ORAL | 4 refills | Status: DC

## 2017-04-21 MED ORDER — VERAPAMIL HCL 120 MG PO TABS: 120.0000 mg | ORAL_TABLET | Freq: Two times a day (BID) | ORAL | 1 refills | Status: DC

## 2017-04-21 MED ORDER — FUROSEMIDE 40 MG PO TABS: 40.0000 mg | ORAL_TABLET | Freq: Every day | ORAL | 99 refills | Status: DC

## 2017-04-21 NOTE — Progress Notes (Signed)
This very nice 71 y.o. [name] presents for 3 month follow up with Hypertension, Hyperlipidemia, Pre-Diabetes and Vitamin D Deficiency.      Patient's BPH historically has been very labile occasionally up to the 180-200/90-100 range and she has required up to 5 meds + diuretic to control her BP. And on other occasions , she has been low to the point of postural dizziness necessitating tapering her meds. Renal Artery Duplex in 2014 found no Renal Aa stenosis.  Today's BP is low at 78/48 sitting and standing. Patient has had no complaints of any cardiac type chest pain, palpitations, dyspnea / orthopnea / PND,  claudication or dependent edema, but doe report postural dizziness & fatigue.     Hyperlipidemia is controlled with diet & meds. Patient denies myalgias or other med SE's. Last Lipids were at goal albe=it elevated Trig's: Lab Results  Component Value Date   CHOL 136 01/09/2017   HDL 40 (L) 01/09/2017   LDLCALC 50 01/09/2017   TRIG 230 (H) 01/09/2017   CHOLHDL 3.4 01/09/2017      Also, the patient has history of T2_NIDDM circa 2006 w/CKD 2 and has had no symptoms of reactive hypoglycemia, diabetic polys, paresthesias or visual blurring.  Last A1c was near goal: Lab Results  Component Value Date   HGBA1C 5.9 (H) 01/09/2017      Further, the patient also has history of Vitamin D Deficiency and supplements vitamin D without any suspected side-effects. Last vitamin D was at goal: Lab Results  Component Value Date   VD25OH 35 12/03/2016   Current Outpatient Medications on File Prior to Visit  Medication Sig  . aspirin 325 MG EC tablet Take 325 mg by mouth daily.  Marland Kitchen atenolol 100 MG tablet TAKE 1 TAB 2 x / day  . Biotin 1000 MCG tablet Take 1,000 mcg by mouth daily. Reported on 08/22/2015  . VITAMIN D Take 10,000 Units by mouth daily.   . citalopram  20 MG tablet TAKE TWO TAB ONCE DAILY  . clonazePAM  1 MG tablet TAKE 1/2-1  TAB 2-3 x / DAILY  . cloNIDine  0.2 MG tablet Take 1 tab 2   times daily.  Marland Kitchen FLAXSEED OIL Take 1,000 mg aily.   . Iron TABS Take  2  times daily.   . Magnesium 500 MG TABS Take 1 tab 3 times daily.  . metFORMIN-XR 500 MG  TAKE ONE TAB THREE TIMES DAILY  . minoxidil  10 MG tablet TAKE ONE TAB AT SUPPERTIME  . Omega-3  FISH OIL 1000 MG  Take  daily  . Red Yeast Rice 600 MG CAPS Take daily with breakfast  . rosuvastatin  20 MG tablet TAKE 1 TAB ONCE DAILY    Allergies  Allergen Reactions  . Lipitor [Atorvastatin]     Elevates LFT's   PMHx:   Past Medical History:  Diagnosis Date  . Anxiety   . Arthritis   . Carpal tunnel syndrome   . Cerebrovascular disease 01/02/2017  . Duodenal diverticulum   . Esophageal motility disorder   . Fatty liver   . GERD (gastroesophageal reflux disease)   . Hyperlipidemia   . Hypertension   . 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 Tubulovillous adenoma of colon   . Type II or unspecified type diabetes mellitus without mention of complication, not stated as uncontrolled    Immunization History  Administered Date(s) Administered  . Influenza Split 03/25/2013  . Influenza, High Dose Seasonal PF 03/15/2014, 03/07/2015,  03/27/2016, 04/21/2017  . Pneumococcal Conjugate-13 01/26/2014  . Pneumococcal Polysaccharide-23 03/14/2011  . Td 03/25/2013  . Zoster 06/03/2005   Past Surgical History:  Procedure Laterality Date  . APPENDECTOMY    . BREAST BIOPSY Right   . CARPAL TUNNEL RELEASE Right 1992  . CYST REMOVAL HAND    . TONSILLECTOMY  1952   FHx:    Reviewed / unchanged  SHx:    Reviewed / unchanged  Systems Review:  Constitutional: Denies fever, chills, wt changes, headaches, insomnia, fatigue, night sweats, change in appetite. Eyes: Denies redness, blurred vision, diplopia, discharge, itchy, watery eyes.  ENT: Denies discharge, congestion, post nasal drip, epistaxis, sore throat, earache, hearing loss, dental pain, tinnitus, vertigo, sinus pain, snoring.  CV: Denies chest pain, palpitations, irregular  heartbeat, syncope, dyspnea, diaphoresis, orthopnea, PND, claudication or edema. Respiratory: denies cough, dyspnea, DOE, pleurisy, hoarseness, laryngitis, wheezing.  Gastrointestinal: Denies dysphagia, odynophagia, heartburn, reflux, water brash, abdominal pain or cramps, nausea, vomiting, bloating, diarrhea, constipation, hematemesis, melena, hematochezia  or hemorrhoids. Genitourinary: Denies dysuria, frequency, urgency, nocturia, hesitancy, discharge, hematuria or flank pain. Musculoskeletal: Denies arthralgias, myalgias, stiffness, jt. swelling, pain, limping or strain/sprain.  Skin: Denies pruritus, rash, hives, warts, acne, eczema or change in skin lesion(s). Neuro: No weakness, tremor, incoordination, spasms, paresthesia or pain. Psychiatric: Denies confusion, memory loss or sensory loss. Endo: Denies change in weight, skin or hair change.  Heme/Lymph: No excessive bleeding, bruising or enlarged lymph nodes.  Physical Exam  BP (!) 78/44   Pulse (!) 44   Temp (!) 97.4 F (36.3 C)   Resp 16   Ht 5\' 4"  (1.626 m)   Wt 154 lb 9.6 oz (70.1 kg)   BMI 26.54 kg/m   Postural Sitting BP 89/51    P 41     and      Standing BP 79/46      P 54   Appears well nourished, well groomed  and in no distress.  Eyes: PERRLA, EOMs, conjunctiva no swelling or erythema. Sinuses: No frontal/maxillary tenderness ENT/Mouth: EAC's clear, TM's nl w/o erythema, bulging. Nares clear w/o erythema, swelling, exudates. Oropharynx clear without erythema or exudates. Oral hygiene is good. Tongue normal, non obstructing. Hearing intact.  Neck: Supple. Thyroid nl. Car 2+/2+ without bruits, nodes or JVD. Chest: Respirations nl with BS clear & equal w/o rales, rhonchi, wheezing or stridor.  Cor: Heart sounds normal w/ regular rate and rhythm without sig. murmurs, gallops, clicks or rubs. Peripheral pulses normal and equal  without edema.  Abdomen: Soft & bowel sounds normal. Non-tender w/o guarding, rebound,  hernias, masses or organomegaly.  Lymphatics: Unremarkable.  Musculoskeletal: Full ROM all peripheral extremities, joint stability, 5/5 strength and normal gait.  Skin: Warm, dry without exposed rashes, lesions or ecchymosis apparent.  Neuro: Cranial nerves intact, reflexes equal bilaterally. Sensory-motor testing grossly intact. Tendon reflexes grossly intact.  Pysch: Alert & oriented x 3.  Insight and judgement nl & appropriate. No ideations.  Assessment and Plan:  1. Essential hypertension  - Advised stop Lasix unless ankles swell. Advised withhold all BP meds including Atenolol, Clonidine, Enalapril, Minoxidil and Verapamil. Monitor BP 2 x/ day and if BP's consistently elevated over 160/90, then may begin to restart BP med at 1/2 usual dose adding 2sd, 3rd med, etc every 2-3 days if BP's consistently elevated and to call day or night if any question what to do. Also encouraged to increase po fluids.  - Continue DASH diet. Reminder to go to the ER if any  CP,  SOB, nausea, dizziness, severe HA, changes vision/speech.  - CBC with Differential/Platelet - BASIC METABOLIC PANEL WITH GFR - Magnesium - TSH  2. Hyperlipidemia, mixed  - Continue diet/meds, exercise,& lifestyle modifications.  - Continue monitor periodic cholesterol/liver & renal functions   - Hepatic function panel - Lipid panel - TSH  3. 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 Vitamin D deficiency  - Continue diet, exercise, lifestyle modifications.  - Monitor appropriate labs. - Continue supplementation. - VITAMIN D 25 Hydroxy  5. Hyperthyroidism  - TSH  6. Medication management  - CBC with Differential/Platelet - BASIC METABOLIC PANEL WITH GFR - Hepatic function panel - Magnesium - Lipid panel - TSH - Hemoglobin A1c - Insulin, random - VITAMIN D 25 Hydroxy   7. Need for immunization against influenza  - Flu vaccine HIGH DOSE PF (Fluzone High  dose)       Discussed  regular exercise, BP monitoring, weight control to achieve/maintain BMI less than 25 and discussed med and SE's. Recommended labs to assess and monitor clinical status with further disposition pending results of labs. Over 30 minutes of exam, counseling, chart review was performed.

## 2017-04-21 NOTE — Patient Instructions (Signed)

## 2017-04-22 LAB — HEPATIC FUNCTION PANEL
AG Ratio: 1.9 (calc) (ref 1.0–2.5)
ALT: 24 U/L (ref 6–29)
AST: 23 U/L (ref 10–35)
Albumin: 4.3 g/dL (ref 3.6–5.1)
Alkaline phosphatase (APISO): 55 U/L (ref 33–130)
Bilirubin, Direct: 0.3 mg/dL — ABNORMAL HIGH (ref 0.0–0.2)
Globulin: 2.3 g/dL (ref 1.9–3.7)
Indirect Bilirubin: 0.9 mg/dL (ref 0.2–1.2)
Total Bilirubin: 1.2 mg/dL (ref 0.2–1.2)
Total Protein: 6.6 g/dL (ref 6.1–8.1)

## 2017-04-22 LAB — LIPID PANEL
CHOL/HDL RATIO: 2.4 (calc) (ref <5.0)
CHOLESTEROL: 109 mg/dL (ref <200)
HDL: 45 mg/dL — ABNORMAL LOW (ref >50)
LDL CHOLESTEROL (CALC): 38 mg/dL
Non-HDL Cholesterol (Calc): 64 mg/dL (calc) (ref <130)
TRIGLYCERIDES: 180 mg/dL — AB (ref <150)

## 2017-04-22 LAB — BASIC METABOLIC PANEL WITH GFR
BUN / CREAT RATIO: 14 (calc) (ref 6–22)
BUN: 18 mg/dL (ref 7–25)
CHLORIDE: 100 mmol/L (ref 98–110)
CO2: 29 mmol/L (ref 20–32)
Calcium: 9.7 mg/dL (ref 8.6–10.4)
Creat: 1.3 mg/dL — ABNORMAL HIGH (ref 0.60–0.93)
GFR, Est African American: 48 mL/min/{1.73_m2} — ABNORMAL LOW (ref >=60)
GFR, Est Non African American: 41 mL/min/{1.73_m2} — ABNORMAL LOW (ref >=60)
GLUCOSE: 159 mg/dL — AB (ref 65–99)
POTASSIUM: 4.5 mmol/L (ref 3.5–5.3)
Sodium: 140 mmol/L (ref 135–146)

## 2017-04-22 LAB — CBC WITH DIFFERENTIAL/PLATELET
Basophils Absolute: 47 cells/uL (ref 0–200)
Basophils Relative: 0.7 %
Eosinophils Absolute: 208 cells/uL (ref 15–500)
Eosinophils Relative: 3.1 %
HCT: 39 % (ref 35.0–45.0)
Hemoglobin: 13.1 g/dL (ref 11.7–15.5)
Lymphs Abs: 1662 cells/uL (ref 850–3900)
MCH: 28.4 pg (ref 27.0–33.0)
MCHC: 33.6 g/dL (ref 32.0–36.0)
MCV: 84.6 fL (ref 80.0–100.0)
MONOS PCT: 9.9 %
MPV: 9.3 fL (ref 7.5–12.5)
NEUTROS PCT: 61.5 %
Neutro Abs: 4121 cells/uL (ref 1500–7800)
PLATELETS: 225 10*3/uL (ref 140–400)
RBC: 4.61 10*6/uL (ref 3.80–5.10)
RDW: 13.9 % (ref 11.0–15.0)
TOTAL LYMPHOCYTE: 24.8 %
WBC mixed population: 663 cells/uL (ref 200–950)
WBC: 6.7 10*3/uL (ref 3.8–10.8)

## 2017-04-22 LAB — HEMOGLOBIN A1C
Hgb A1c MFr Bld: 5.9 %{Hb} — ABNORMAL HIGH
Mean Plasma Glucose: 123 (calc)
eAG (mmol/L): 6.8 (calc)

## 2017-04-22 LAB — MAGNESIUM: MAGNESIUM: 1.8 mg/dL (ref 1.5–2.5)

## 2017-04-22 LAB — VITAMIN D 25 HYDROXY (VIT D DEFICIENCY, FRACTURES): Vit D, 25-Hydroxy: 61 ng/mL (ref 30–100)

## 2017-04-22 LAB — TSH: TSH: 0.83 mIU/L (ref 0.40–4.50)

## 2017-04-22 LAB — INSULIN, RANDOM: Insulin: 43.2 u[IU]/mL — ABNORMAL HIGH (ref 2.0–19.6)

## 2017-07-08 LAB — HM DIABETES EYE EXAM

## 2017-07-16 ENCOUNTER — Encounter: Payer: Self-pay | Admitting: *Deleted

## 2017-07-24 DIAGNOSIS — Z0000 Encounter for general adult medical examination without abnormal findings: Secondary | ICD-10-CM | POA: Insufficient documentation

## 2017-07-24 DIAGNOSIS — H25812 Combined forms of age-related cataract, left eye: Secondary | ICD-10-CM | POA: Diagnosis not present

## 2017-07-24 DIAGNOSIS — E663 Overweight: Secondary | ICD-10-CM | POA: Insufficient documentation

## 2017-07-24 DIAGNOSIS — H2512 Age-related nuclear cataract, left eye: Secondary | ICD-10-CM | POA: Diagnosis not present

## 2017-07-24 DIAGNOSIS — E119 Type 2 diabetes mellitus without complications: Secondary | ICD-10-CM | POA: Insufficient documentation

## 2017-07-24 NOTE — Progress Notes (Signed)
MEDICARE ANNUAL WELLNESS VISIT AND FOLLOW UP  Assessment:   Diagnoses and all orders for this visit:  Encounter for Medicare annual wellness exam  Cerebrovascular disease Control blood pressure, cholesterol, glucose, increase exercise.   Essential hypertension Concerns for hypotension ongoing today; discussed at length and will further reduce agents - atenolol to 25 mg BID, clonidine taper to stop, continue holding lasix, enalapril 20 mg BID, minoxidil 5 mg, verapamil 60 mg bid Monitor blood pressure at home; call if consistently over 130/80, will follow up closely next week Continue DASH diet.   Reminder to go to the ER if any CP, SOB, nausea, dizziness, severe HA, changes vision/speech, left arm numbness and tingling and jaw pain. -     Magnesium  Fatty liver Weight loss advised; continue to monitor -     Hepatic function panel  Gastroesophageal reflux disease, esophagitis presence not specified Well managed on current medications Discussed diet, avoiding triggers and other lifestyle changes -     Magnesium  Hyperthyroidism      Monitor TSH levels - recently within normal range  -     TSH  Type 2 diabetes mellitus with diabetic nephropathy, without long-term current use of insulin ([place]) Discussed general issues about diabetes pathophysiology and management., Educational material distributed., Suggested low cholesterol diet., Encouraged aerobic exercise., Discussed foot care., Reminded to get yearly retinal exam- most recent exam report available and extracted. -     Hemoglobin A1c  CKD stage 3 due to type 2 diabetes mellitus (HCC) Increase fluids, avoid NSAIDS, monitor sugars, will monitor -     BASIC METABOLIC PANEL WITH GFR  Anemia, unspecified type -     CBC with Differential/Platelet  COLONIC POLYPS, ADENOMATOUS, HX OF Colonoscopies q5 years, due 09/2017   Idiopathic gout, unspecified chronicity, unspecified site Controlled without recent flare, Diet discussed,  Check uric acid as needed  Medication management -     CBC with Differential/Platelet -     BASIC METABOLIC PANEL WITH GFR -     Hepatic function panel  Mixed hyperlipidemia Continue medications: crestor 20 mg daily Continue low cholesterol diet and exercise.  -     Lipid panel -     TSH  Vitamin D deficiency Near goal at recent check; continue to recommend supplementation for goal of 70-100 Defer vitamin D level  Overweight (BMI 25.0-29.9) Long discussion about weight loss, diet, and exercise Recommended diet heavy in fruits and veggies and low in animal meats, cheeses, and dairy products, appropriate calorie intake Discussed appropriate weight for height Follow up at next visit  Over 40 minutes of exam, counseling, chart review and critical decision making was performed Future Appointments  Date Time Provider [place]  11/06/2017 11:00 AM [name], MD [facility] None     Plan:   During the course of the visit the patient was educated and counseled about appropriate screening and preventive services including:    Pneumococcal vaccine   Prevnar 13  Influenza vaccine  Td vaccine  Screening electrocardiogram  Bone densitometry screening  Colorectal cancer screening  Diabetes screening  Glaucoma screening  Nutrition counseling   Advanced directives: requested   Subjective:  Michele [name] is a 72 y.o. female who presents for Medicare Annual Wellness Visit and 3 month follow up.   BMI is Body mass index is 27.6 kg/m., she has not been working on diet and exercise. Wt Readings from Last 3 Encounters:  07/25/17 160 lb 12.8 oz (72.9 kg)  04/21/17 154 lb  9.6 oz (70.1 kg)  01/27/17 158 lb (71.7 kg)    Her blood pressure has been controlled at home (130-140/60-70, HR around 60 in the AM, tends to get lower during the day, then might increase at night), today their BP is BP: (!) 102/58 She does workout (works on a farm) She denies chest pain,  shortness of breath, but does endorse intermittent/sudden onset fatigue/ weakness, reports energy levels have been very low. This is ongoing for several months. She report she has checked BPs in these circumstances and has been on the low side, has checked BGL and has been normal. At the last visit she was extremely hypotensive; she has polypharmacy for BP management due to recent history of hypertensive crisis.   Orthostatics obtained today: Supine 110/70, P 51, sitting 108/64, P 51, Standing 94/60, P52  She is on cholesterol medication and denies myalgias. Her total and LDL cholesterol is at goal, trigs remained somewhat elevated. The cholesterol last visit was:   Lab Results  Component Value Date   CHOL 109 04/21/2017   HDL 45 (L) 04/21/2017   LDLCALC 50 01/09/2017   TRIG 180 (H) 04/21/2017   CHOLHDL 2.4 04/21/2017    She has not been working on diet and exercise for T2 diabetes, well controlled with metformin with recent A1C in prediabetic range, and denies increased appetite, nausea, paresthesia of the feet, polydipsia, polyuria, visual disturbances and vomiting. Last A1C in the office was:  Lab Results  Component Value Date   HGBA1C 5.9 (H) 04/21/2017   Last GFR: Lab Results  Component Value Date   GFRNONAA 41 (L) 04/21/2017   Patient is on Vitamin D supplement and was near goal of 70 at last check:    Lab Results  Component Value Date   VD25OH 61 04/21/2017     She has a documented history of hyperthyroidism. She is not currently on medications for thyroid, last TSH was within normal range:    Lab Results  Component Value Date   TSH 0.83 04/21/2017   Patient is not on allopurinol for gout and does not report a recent flare.  Lab Results  Component Value Date   LABURIC 6.3 12/03/2016     Medication Review: Current Outpatient Medications on File Prior to Visit  Medication Sig Dispense Refill  . APPLE CIDER VINEGAR PO Take by mouth.    Marland Kitchen aspirin 325 MG EC tablet Take  325 mg by mouth daily.    Marland Kitchen atenolol (TENORMIN) 100 MG tablet TAKE 1 TABLET BY MOUTH IN THE MORNING AND 1 AT BEDTIME (Patient taking differently: 50 mg. TAKE 1 TABLET BY MOUTH IN THE MORNING AND 1 AT BEDTIME) 180 tablet 1  . Biotin 1000 MCG tablet Take 1,000 mcg by mouth daily. Reported on 08/22/2015    . Blood Glucose Monitoring Suppl (ONE TOUCH ULTRA SYSTEM KIT) w/Device KIT Check blood sugar 1 time daily-DX-E11.21 1 each 0  . Cholecalciferol (VITAMIN D PO) Take 10,000 Units by mouth daily.     Marland Kitchen CINNAMON PO Take by mouth.    . citalopram (CELEXA) 20 MG tablet TAKE TWO TABLETS BY MOUTH ONCE DAILY FOR ANXIETY AND MOOD 90 tablet 3  . clonazePAM (KLONOPIN) 1 MG tablet TAKE 1/2 TO 1 (ONE-HALF TO ONE) TABLET BY MOUTH THREE TIMES DAILY OR  AS  DIRECTED  FOR  ANXIETY (Patient taking differently: Take one tablet by mouth in the morning and one tablet in the evening) 90 tablet 2  . cloNIDine (CATAPRES) 0.2 MG  tablet Take 1 tablet (0.2 mg total) by mouth 2 (two) times daily. 60 tablet 11  . enalapril (VASOTEC) 20 MG tablet Take 1 tablet (20 mg total) 2 (two) times daily by mouth. 180 tablet 4  . Flaxseed, Linseed, (FLAXSEED OIL PO) Take 1,000 mg by mouth daily. Reported on 08/22/2015    . glucose blood test strip Check blood sugar 2 times daily-DX-E11.21. Please dispense One Touch Ultra test strips. 200 each 1  . Iron TABS Take 325 mg by mouth 2 (two) times daily.     . Lancets (ONETOUCH ULTRASOFT) lancets Check blood sugar 1 time daily-DX-E11.21 100 each 1  . Magnesium 500 MG TABS Take 1 tablet by mouth 3 (three) times daily.    . metFORMIN (GLUCOPHAGE-XR) 500 MG 24 hr tablet TAKE ONE TABLET BY MOUTH THREE TIMES DAILY 270 tablet 3  . minoxidil (LONITEN) 10 MG tablet TAKE ONE TABLET BY MOUTH AT SUPPERTIME (Patient taking differently: TAKE 1/2TABLET BY MOUTH AT SUPPERTIME) 90 tablet 1  . Omega-3 Fatty Acids (FISH OIL) 1000 MG CAPS Take 1,000 mg by mouth daily. Reported on 08/22/2015    . Red Yeast Rice 600 MG  CAPS Take 600 mg by mouth daily with breakfast. Reported on 08/22/2015    . rosuvastatin (CRESTOR) 20 MG tablet TAKE 1 TABLET BY MOUTH ONCE DAILY AT  6PM (Patient taking differently: Take 1/2 tablet three times a week) 90 tablet 1  . verapamil (CALAN) 120 MG tablet Take 1 tablet (120 mg total) 2 (two) times daily by mouth. 180 tablet 1  . furosemide (LASIX) 40 MG tablet Take 1 tablet (40 mg total) daily by mouth. (Patient not taking: Reported on 07/25/2017) 90 tablet 99   No current facility-administered medications on file prior to visit.     Allergies  Allergen Reactions  . Lipitor [Atorvastatin]     Elevates LFT's    Current Problems (verified) Patient Active Problem List   Diagnosis Date Noted  . Encounter for Medicare annual wellness exam 07/24/2017  . Type 2 diabetes mellitus ([place]) 07/24/2017  . Overweight (BMI 25.0-29.9) 07/24/2017  . Cerebrovascular disease 01/02/2017  . Myalgia and myositis 02/16/2016  . Hyperthyroidism 09/11/2015  . Headache   . Gout 09/22/2014  . Vitamin D deficiency 06/28/2013  . Medication management 06/28/2013  . Anemia 06/28/2013  . CKD stage 3 due to type 2 diabetes mellitus ([place])   . Mixed hyperlipidemia 03/21/2008  . Essential hypertension 03/21/2008  . GERD 03/21/2008  . Fatty liver 03/21/2008  . COLONIC POLYPS, ADENOMATOUS, HX OF 03/21/2008    Screening Tests Immunization History  Administered Date(s) Administered  . Influenza Split 03/25/2013  . Influenza, High Dose Seasonal PF 03/15/2014, 03/07/2015, 03/27/2016, 04/21/2017  . Pneumococcal Conjugate-13 01/26/2014  . Pneumococcal Polysaccharide-23 03/14/2011  . Td 03/25/2013  . Zoster 06/03/2005   Preventative care: Last colonoscopy: 2014 DUE 09/2017 Last mammogram:03/2017 DEXA:2015 , does want, ordered  Prior vaccinations: TD or Tdap: 2014  Influenza: 2018  Pneumococcal: 2012 Prevnar13: 2014 Shingles/Zostavax: 2007  Names of Other Physician/Practitioners you currently  use: 1. [facility]- here for primary care 2. Dr. Ellie Lunch, eye doctor, last visit 07/2017 3. Dr. Illene Bolus, dentist, last visit 2018 q 6 months  Patient Care Team: [name], MD as PCP - General (Internal Medicine)  SURGICAL HISTORY She  has a past surgical history that includes Appendectomy; Tonsillectomy (1952); Carpal tunnel release (Right, 1992); Cyst removal hand; and Breast biopsy (Right). FAMILY HISTORY Her family history includes Cerebral  aneurysm in her sister; Colon polyps in her mother; Diabetes in her father and mother; Heart attack in her brother and father; Heart disease in her brother, brother, father, and mother. SOCIAL HISTORY She  reports that  has never smoked. she has never used smokeless tobacco. She reports that she does not drink alcohol or use drugs.   MEDICARE WELLNESS OBJECTIVES: Physical activity: Current Exercise Habits: The patient does not participate in regular exercise at present Cardiac risk factors: Cardiac Risk Factors include: family history of premature cardiovascular disease;advanced age (>37mn, >>13women);diabetes mellitus;dyslipidemia;hypertension;smoking/ tobacco exposure Depression/mood screen:   [facility] 07/25/2017  Decreased Interest 1  Down, Depressed, Hopeless 1  PHQ - 2 Score 2  Altered sleeping 0  Tired, decreased energy 3  Change in appetite 0  Feeling bad or failure about yourself  1  Trouble concentrating 1  Moving slowly or fidgety/restless 1  Suicidal thoughts 0  PHQ-9 Score 8  Difficult doing work/chores Somewhat difficult    ADLs:  In your present state of health, do you have any difficulty performing the following activities: 07/25/2017 04/21/2017  Hearing? N N  Vision? N N  Difficulty concentrating or making decisions? N N  Walking or climbing stairs? N N  Dressing or bathing? N N  Doing errands, shopping? N N  Some recent data might be hidden      Cognitive Testing  Alert? Yes  Normal Appearance?Yes  Oriented to person? Yes  Place? Yes   Time? Yes  Recall of three objects?  Yes  Can perform simple calculations? Yes  Displays appropriate judgment?Yes  Can read the correct time from a watch face?Yes  EOL planning: Does Patient Have a Medical Advance Directive?: Yes Type of Advance Directive: Healthcare Power of Attorney, Living will Does patient want to make changes to medical advance directive?: No - Patient declined Copy of HFalls Viewin Chart?: Yes  Review of Systems  Constitutional: Positive for malaise/fatigue. Negative for weight loss.  HENT: Positive for ear pain (Intermittent, R ear, improved on allergy medication). Negative for hearing loss and tinnitus.   Eyes: Negative for blurred vision and double vision.  Respiratory: Negative for cough, shortness of breath and wheezing.   Cardiovascular: Negative for chest pain, palpitations, orthopnea, claudication and leg swelling.  Gastrointestinal: Negative for abdominal pain, blood in stool, constipation, diarrhea, heartburn, melena, nausea and vomiting.  Genitourinary: Negative.   Musculoskeletal: Negative for falls, joint pain and myalgias.  Skin: Negative for rash.  Neurological: Positive for weakness. Negative for dizziness, tingling, sensory change, speech change, focal weakness, loss of consciousness and headaches.  Endo/Heme/Allergies: Negative for polydipsia.  Psychiatric/Behavioral: Positive for depression. Negative for memory loss and substance abuse. The patient is not nervous/anxious and does not have insomnia.   All other systems reviewed and are negative.   Objective:     Today's Vitals   07/25/17 0940  BP: (!) 102/58  Pulse: (!) 46  Temp: 98.2 F (36.8 C)  SpO2: 97%  Weight: 160 lb 12.8 oz (72.9 kg)  Height: _0  (1.626 m)   Body mass index is 27.6 kg/m.  General appearance: alert, no distress, WD/WN, female HEENT:  normocephalic, sclerae anicteric, TMs pearly, nares patent, no discharge or erythema, pharynx normal Oral cavity: MMM, no lesions Neck: supple, no lymphadenopathy, no thyromegaly, no masses Heart: RRR, normal S1, S2, no murmurs Lungs: CTA bilaterally, no wheezes, rhonchi, or rales Abdomen: +bs, soft, non tender, non distended, no masses, no hepatomegaly, no splenomegaly  Musculoskeletal: nontender, no swelling, no obvious deformity Extremities: no edema, no cyanosis, no clubbing Pulses: 2+ symmetric, upper and lower extremities, normal cap refill Neurological: alert, oriented x 3, CN2-12 intact, strength normal upper extremities and lower extremities, sensation normal throughout, DTRs 2+ throughout, no cerebellar signs, gait normal Psychiatric: depressed affect, behavior normal, pleasant   Medicare Attestation I have personally reviewed: The patient's medical and social history Their use of alcohol, tobacco or illicit drugs Their current medications and supplements The patient's functional ability including ADLs,fall risks, home safety risks, cognitive, and hearing and visual impairment Diet and physical activities Evidence for depression or mood disorders  The patient's weight, height, BMI, and visual acuity have been recorded in the chart.  I have made referrals, counseling, and provided education to the patient based on review of the above and I have provided the patient with a written personalized care plan for preventive services.     Michele Ribas, NP   07/25/2017

## 2017-07-25 ENCOUNTER — Telehealth: Payer: Self-pay | Admitting: Internal Medicine

## 2017-07-25 ENCOUNTER — Encounter: Payer: Self-pay | Admitting: Adult Health

## 2017-07-25 ENCOUNTER — Ambulatory Visit (INDEPENDENT_AMBULATORY_CARE_PROVIDER_SITE_OTHER): Payer: PPO | Admitting: Adult Health

## 2017-07-25 VITALS — BP 102/58 | HR 46 | Temp 98.2°F | Ht 64.0 in | Wt 160.8 lb

## 2017-07-25 DIAGNOSIS — Z86010 Personal history of colonic polyps: Secondary | ICD-10-CM | POA: Diagnosis not present

## 2017-07-25 DIAGNOSIS — E782 Mixed hyperlipidemia: Secondary | ICD-10-CM | POA: Diagnosis not present

## 2017-07-25 DIAGNOSIS — Z0000 Encounter for general adult medical examination without abnormal findings: Secondary | ICD-10-CM

## 2017-07-25 DIAGNOSIS — E559 Vitamin D deficiency, unspecified: Secondary | ICD-10-CM | POA: Diagnosis not present

## 2017-07-25 DIAGNOSIS — I679 Cerebrovascular disease, unspecified: Secondary | ICD-10-CM | POA: Diagnosis not present

## 2017-07-25 DIAGNOSIS — R6889 Other general symptoms and signs: Secondary | ICD-10-CM

## 2017-07-25 DIAGNOSIS — E1122 Type 2 diabetes mellitus with diabetic chronic kidney disease: Secondary | ICD-10-CM

## 2017-07-25 DIAGNOSIS — E2839 Other primary ovarian failure: Secondary | ICD-10-CM

## 2017-07-25 DIAGNOSIS — E1121 Type 2 diabetes mellitus with diabetic nephropathy: Secondary | ICD-10-CM

## 2017-07-25 DIAGNOSIS — K219 Gastro-esophageal reflux disease without esophagitis: Secondary | ICD-10-CM | POA: Diagnosis not present

## 2017-07-25 DIAGNOSIS — D649 Anemia, unspecified: Secondary | ICD-10-CM | POA: Diagnosis not present

## 2017-07-25 DIAGNOSIS — K760 Fatty (change of) liver, not elsewhere classified: Secondary | ICD-10-CM

## 2017-07-25 DIAGNOSIS — Z79899 Other long term (current) drug therapy: Secondary | ICD-10-CM

## 2017-07-25 DIAGNOSIS — E663 Overweight: Secondary | ICD-10-CM

## 2017-07-25 DIAGNOSIS — R0989 Other specified symptoms and signs involving the circulatory and respiratory systems: Secondary | ICD-10-CM

## 2017-07-25 DIAGNOSIS — Z0001 Encounter for general adult medical examination with abnormal findings: Secondary | ICD-10-CM

## 2017-07-25 DIAGNOSIS — I10 Essential (primary) hypertension: Secondary | ICD-10-CM | POA: Diagnosis not present

## 2017-07-25 DIAGNOSIS — N183 Chronic kidney disease, stage 3 (moderate): Secondary | ICD-10-CM

## 2017-07-25 DIAGNOSIS — M1000 Idiopathic gout, unspecified site: Secondary | ICD-10-CM

## 2017-07-25 DIAGNOSIS — E0590 Thyrotoxicosis, unspecified without thyrotoxic crisis or storm: Secondary | ICD-10-CM | POA: Diagnosis not present

## 2017-07-25 MED ORDER — MINOXIDIL 10 MG PO TABS: ORAL_TABLET | ORAL | 1 refills | Status: DC

## 2017-07-25 MED ORDER — ROSUVASTATIN CALCIUM 20 MG PO TABS
ORAL_TABLET | ORAL | 1 refills | Status: DC
Start: 2017-07-25 — End: 2018-11-10

## 2017-07-25 MED ORDER — CLONIDINE HCL 0.2 MG PO TABS: 0.2000 mg | ORAL_TABLET | Freq: Two times a day (BID) | ORAL | 11 refills | Status: DC | PRN

## 2017-07-25 MED ORDER — VERAPAMIL HCL 120 MG PO TABS
60.0000 mg | ORAL_TABLET | Freq: Two times a day (BID) | ORAL | 1 refills | Status: DC
Start: 2017-07-25 — End: 2017-08-01

## 2017-07-25 MED ORDER — ATENOLOL 50 MG PO TABS: 25.0000 mg | ORAL_TABLET | Freq: Two times a day (BID) | ORAL | 1 refills | Status: DC

## 2017-07-25 NOTE — Telephone Encounter (Signed)
See Dr. Blanch Media response.

## 2017-07-25 NOTE — Telephone Encounter (Signed)
sure

## 2017-07-25 NOTE — Patient Instructions (Signed)
Please monitor your blood pressure, as we get older our body can not respond to a low blood pressure as well as it did when we were younger, for this reason we want a bit higher of a blood pressure as you get older to avoid dizziness and fatigue which can lead to falls. [place] call if your blood pressure is consistently above 150/90.    Please monitor your blood pressure. If it is getting below 130/80 AND you are having fatigue with exertion, dizziness we may need to cut your blood pressure medication [place]own. Please call the office if this is happening. Hypotension As your heart beats, it forces blood through your body. This force is called blood pressure. If you have hypotension, you have low blood pressure. When your blood pressure is too low, you may not get enough blood to your brain. You may feel weak, feel lightheaded, have a fast heartbeat, or even pass out (faint). HOME CARE  Drink enough fluids to keep your pee (urine) clear or pale yellow.  Take all medicines as told by your doctor.  Get up slowly after sitting or lying down.  Wear support stockings as told by your doctor.  Maintain a healthy diet by including foods such as fruits, vegetables, nuts, whole grains, and lean meats. GET HELP IF:  You are throwing up (vomiting) or have watery poop (diarrhea).  You have a fever for more than 2-3 days.  You feel more thirsty than usual.  You feel weak and tired. GET HELP RIGHT AWAY IF:   You pass out (faint).  You have chest pain or a fast or irregular heartbeat.  You lose feeling in part of your body.  You cannot move your arms or legs.  You have trouble speaking.  You get sweaty or feel lightheaded. MAKE SURE YOU:   Understand these instructions.  Will watch your condition.  Will get help right away if you are not doing well or get worse. Document Released: 08/14/2009 Document Revised: 01/20/2013 Document Reviewed: 11/20/2012 [facility] Patient Information 2015  [place], [place]. This information is not intended to replace advice given to you by your health care provider. Make sure you discuss any questions you have with your health care provider.

## 2017-07-25 NOTE — Telephone Encounter (Signed)
Dr. Perry please see below and advise. 

## 2017-07-26 LAB — HEPATIC FUNCTION PANEL
AG RATIO: 1.7 (calc) (ref 1.0–2.5)
ALKALINE PHOSPHATASE (APISO): 78 U/L (ref 33–130)
ALT: 39 U/L — ABNORMAL HIGH (ref 6–29)
AST: 31 U/L (ref 10–35)
Albumin: 4.3 g/dL (ref 3.6–5.1)
BILIRUBIN DIRECT: 0.1 mg/dL (ref 0.0–0.2)
BILIRUBIN INDIRECT: 0.7 mg/dL (ref 0.2–1.2)
BILIRUBIN TOTAL: 0.8 mg/dL (ref 0.2–1.2)
GLOBULIN: 2.5 g/dL (ref 1.9–3.7)
Total Protein: 6.8 g/dL (ref 6.1–8.1)

## 2017-07-26 LAB — BASIC METABOLIC PANEL WITH GFR
BUN / CREAT RATIO: 14 (calc) (ref 6–22)
BUN: 16 mg/dL (ref 7–25)
CHLORIDE: 103 mmol/L (ref 98–110)
CO2: 25 mmol/L (ref 20–32)
Calcium: 9.8 mg/dL (ref 8.6–10.4)
Creat: 1.11 mg/dL — ABNORMAL HIGH (ref 0.60–0.93)
GFR, EST AFRICAN AMERICAN: 58 mL/min/{1.73_m2} — AB (ref >=60)
GFR, EST NON AFRICAN AMERICAN: 50 mL/min/{1.73_m2} — AB (ref >=60)
Glucose, Bld: 99 mg/dL (ref 65–99)
Potassium: 4.9 mmol/L (ref 3.5–5.3)
Sodium: 138 mmol/L (ref 135–146)

## 2017-07-26 LAB — MAGNESIUM: Magnesium: 1.9 mg/dL (ref 1.5–2.5)

## 2017-07-26 LAB — CBC WITH DIFFERENTIAL/PLATELET
BASOS PCT: 0.5 %
Basophils Absolute: 39 cells/uL (ref 0–200)
EOS ABS: 293 {cells}/uL (ref 15–500)
Eosinophils Relative: 3.8 %
HEMATOCRIT: 40.7 % (ref 35.0–45.0)
Hemoglobin: 13.8 g/dL (ref 11.7–15.5)
LYMPHS ABS: 2564 {cells}/uL (ref 850–3900)
MCH: 28.1 pg (ref 27.0–33.0)
MCHC: 33.9 g/dL (ref 32.0–36.0)
MCV: 82.9 fL (ref 80.0–100.0)
MPV: 9.7 fL (ref 7.5–12.5)
Monocytes Relative: 8.9 %
NEUTROS PCT: 53.5 %
Neutro Abs: 4120 cells/uL (ref 1500–7800)
PLATELETS: 213 10*3/uL (ref 140–400)
RBC: 4.91 10*6/uL (ref 3.80–5.10)
RDW: 14 % (ref 11.0–15.0)
TOTAL LYMPHOCYTE: 33.3 %
WBC: 7.7 10*3/uL (ref 3.8–10.8)
WBCMIX: 685 {cells}/uL (ref 200–950)

## 2017-07-26 LAB — LIPID PANEL
CHOL/HDL RATIO: 3.6 (calc) (ref <5.0)
Cholesterol: 162 mg/dL (ref <200)
HDL: 45 mg/dL — ABNORMAL LOW (ref >50)
LDL Cholesterol (Calc): 71 mg/dL (calc)
Non-HDL Cholesterol (Calc): 117 mg/dL (calc) (ref <130)
Triglycerides: 382 mg/dL — ABNORMAL HIGH (ref <150)

## 2017-07-26 LAB — HEMOGLOBIN A1C
EAG (MMOL/L): 7.9 (calc)
HEMOGLOBIN A1C: 6.6 %{Hb} — AB (ref <5.7)
MEAN PLASMA GLUCOSE: 143 (calc)

## 2017-07-26 LAB — TSH: TSH: 0.86 m[IU]/L (ref 0.40–4.50)

## 2017-07-31 NOTE — Progress Notes (Signed)
Assessment and Plan:  Clar[place]ell was seen today for follow-up.  Diagnoses and all orders for this visit:  Labile hypertension Resume minoxidil 10 mg daily, verapamil 120 mg BID, maintain other medications and continue to monitor -  Monitor blood pressure at home; call if consistently over 130/80 Continue DASH diet.   Reminder to go to the ER if any CP, SOB, nausea, dizziness, severe HA, changes vision/speech, left arm numbness and tingling and jaw pain.  Fatigue, unspecified type/myalgia Adjusting BP medication has not improved overwhelming fatigue/dizziness, today reports ongoing myalgias as well -     ANA -     Sedimentation rate -     Anti-Smith antibody -     Cyclic citrul peptide antibody, IgG -     B. burgdorfi antibodies   Further disposition pending results of labs. Discussed med's effects and SE's.   Over 15 minutes of exam, counseling, chart review, and critical decision making was performed.   Future Appointments  Date Time Provider [place]  11/06/2017 11:00 AM [name], MD [facility] None    ------------------------------------------------------------------------------------------------------------------   HPI BP 110/64   Pulse (!) 51   Temp (!) 97.3 F (36.3 C)   Ht '5\' 4"'  (1.626 m)   Wt 161 lb (73 kg)   SpO2 98%   BMI 27.64 kg/m    72 y.o.female presents for follow up after hypotension was noted at OV last week and medications were adjusted; she has recent hx of hypertensive crisis, cerebrovascular dz, followed by neurology, on review of chart has been c/o dizziness and weakness for over a year now with consistent low recorded BPs. She is followed by neurology who have commended possibly related to BP, but she reports energy levels/fatigue has not improved over past week with reduction of blood pressure medications. Today she endorses that she has also had intermittent myalgias; on review of labs, aldolase and CK have been checked and unremarkable.  She does have depression/anxiety and on celexa 40 mg daily and klonopin 1 mg TID PRN but reports this has not improved symptoms.   At the last visit:  Concerns for hypotension ongoing today; discussed at length and will further reduce agents - atenolol to 25 mg BID, clonidine taper to stop, continue holding lasix, enalapril 20 mg BID, minoxidil 5 mg, verapamil 60 mg bid  Home BP logs show values ranging from 130/74 to 193/104, pulses ranging from 53 to 71.   Past Medical History:  Diagnosis Date  . Anxiety   . Arthritis   . Carpal tunnel syndrome   . Cerebrovascular disease 01/02/2017  . Duodenal diverticulum   . Esophageal motility disorder   . Fatty liver   . GERD (gastroesophageal reflux disease)   . Hyperlipidemia   . Hypertension   . 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 Tubulovillous adenoma of colon   . Type II or unspecified type diabetes mellitus without mention of complication, not stated as uncontrolled      Allergies  Allergen Reactions  . Lipitor [Atorvastatin]     Elevates LFT's    Current Outpatient Medications on File Prior to Visit  Medication Sig  . APPLE CIDER VINEGAR PO Take by mouth.  Marland Kitchen aspirin 325 MG EC tablet Take 325 mg by mouth daily.  Marland Kitchen atenolol (TENORMIN) 50 MG tablet Take 0.5 tablets (25 mg total) by mouth 2 (two) times daily.  . Biotin 1000 MCG tablet Take 1,000 mcg by mouth daily. Reported on 08/22/2015  . Blood Glucose Monitoring Suppl (ONE TOUCH  ULTRA SYSTEM KIT) w/Device KIT Check blood sugar 1 time daily-DX-E11.21  . Cholecalciferol (VITAMIN D PO) Take 10,000 Units by mouth daily.   Marland Kitchen CINNAMON PO Take by mouth.  . clonazePAM (KLONOPIN) 1 MG tablet TAKE 1/2 TO 1 (ONE-HALF TO ONE) TABLET BY MOUTH THREE TIMES DAILY OR  AS  DIRECTED  FOR  ANXIETY (Patient taking differently: Take one tablet by mouth in the morning and one tablet in the evening)  . cloNIDine (CATAPRES) 0.2 MG tablet Take 1 tablet (0.2 mg total) by mouth 2 (two) times daily as needed. For  high blood pressure. (Patient taking differently: Take 0.2 mg by mouth 2 (two) times daily as needed. Take 1/2 of 1/2 twice daily)  . enalapril (VASOTEC) 20 MG tablet Take 1 tablet (20 mg total) 2 (two) times daily by mouth.  . Flaxseed, Linseed, (FLAXSEED OIL PO) Take 1,000 mg by mouth daily. Reported on 08/22/2015  . furosemide (LASIX) 40 MG tablet Take 1 tablet (40 mg total) daily by mouth.  Marland Kitchen glucose blood test strip Check blood sugar 2 times daily-DX-E11.21. Please dispense One Touch Ultra test strips.  . Iron TABS Take 325 mg by mouth 2 (two) times daily.   . Lancets (ONETOUCH ULTRASOFT) lancets Check blood sugar 1 time daily-DX-E11.21  . Magnesium 500 MG TABS Take 1 tablet by mouth 3 (three) times daily.  . metFORMIN (GLUCOPHAGE-XR) 500 MG 24 hr tablet TAKE ONE TABLET BY MOUTH THREE TIMES DAILY  . Omega-3 Fatty Acids (FISH OIL) 1000 MG CAPS Take 1,000 mg by mouth daily. Reported on 08/22/2015  . Red Yeast Rice 600 MG CAPS Take 600 mg by mouth daily with breakfast. Reported on 08/22/2015  . rosuvastatin (CRESTOR) 20 MG tablet Take 1/2 tablet three times a week   No current facility-administered medications on file prior to visit.     ROS: all negative except above.   Physical Exam:  BP 110/64   Pulse (!) 51   Temp (!) 97.3 F (36.3 C)   Ht '5\' 4"'  (1.626 m)   Wt 161 lb (73 kg)   SpO2 98%   BMI 27.64 kg/m   General Appearance: Well nourished, in no apparent distress. Eyes: PERRLA, EOMs, conjunctiva no swelling or erythema Sinuses: No Frontal/maxillary tenderness ENT/Mouth: Ext aud canals clear, TMs without erythema, bulging. No erythema, swelling, or exudate on post pharynx.  Tonsils not swollen or erythematous. Hearing normal.  Neck: Supple, thyroid normal.  Respiratory: Respiratory effort normal, BS equal bilaterally without rales, rhonchi, wheezing or stridor.  Cardio: RRR with no MRGs. Brisk peripheral pulses without edema.  Abdomen: Soft, + BS.  Non tender, no guarding,  rebound, hernias, masses. Lymphatics: Non tender without lymphadenopathy.  Musculoskeletal: Full ROM, 5/5 strength, normal gait.  Skin: Warm, dry without rashes, lesions, ecchymosis.  Neuro: Cranial nerves intact. Normal muscle tone, no cerebellar symptoms. Sensation intact.  Psych: Awake and oriented X 3, normal affect, Insight and Judgment appropriate.     Izora Ribas, NP 12:08 PM [facility]

## 2017-08-01 ENCOUNTER — Encounter: Payer: Self-pay | Admitting: Adult Health

## 2017-08-01 ENCOUNTER — Ambulatory Visit (INDEPENDENT_AMBULATORY_CARE_PROVIDER_SITE_OTHER): Payer: PPO | Admitting: Adult Health

## 2017-08-01 VITALS — BP 110/64 | HR 51 | Temp 97.3°F | Ht 64.0 in | Wt 161.0 lb

## 2017-08-01 DIAGNOSIS — R5383 Other fatigue: Secondary | ICD-10-CM | POA: Diagnosis not present

## 2017-08-01 DIAGNOSIS — M791 Myalgia, unspecified site: Secondary | ICD-10-CM

## 2017-08-01 DIAGNOSIS — R0989 Other specified symptoms and signs involving the circulatory and respiratory systems: Secondary | ICD-10-CM | POA: Diagnosis not present

## 2017-08-01 MED ORDER — VERAPAMIL HCL 120 MG PO TABS: 120.0000 mg | ORAL_TABLET | Freq: Two times a day (BID) | ORAL | 1 refills | Status: DC

## 2017-08-01 MED ORDER — CITALOPRAM HYDROBROMIDE 40 MG PO TABS: ORAL_TABLET | ORAL | 1 refills | Status: DC

## 2017-08-01 MED ORDER — MINOXIDIL 10 MG PO TABS: ORAL_TABLET | ORAL | 1 refills | Status: DC

## 2017-08-01 NOTE — Patient Instructions (Addendum)
Resume 1 full tab (10 mg) minoxidil in the evenings, increase verapamil back to 120 mg twice daily.    Monitor your blood pressure at home, please keep a record and bring that in with you to your next office visit.   Go to the ER if any CP, SOB, nausea, dizziness, severe HA, changes vision/speech  Due to a recent study, SPRINT, we have changed our goal for the systolic or top blood pressure number. Ideally we want your top number at 120.  In the [facility] Trial, 5000 people were randomized to a goal BP of 120 and 5000 people were randomized to a goal BP of less than 140. The patients with the goal BP at 120 had LESS DEMENTIA, LESS HEART ATTACKS, AND LESS STROKES, AS WELL AS OVERALL DECREASED MORTALITY OR DEATH RATE.   If you are willing, our goal BP is the top number of 120.  Your most recent BP: BP: 110/64   Take your medications faithfully as instructed. Maintain a healthy weight. Get at least 150 minutes of aerobic exercise per week. Minimize salt intake. Minimize alcohol intake  DASH Eating Plan DASH stands for "Dietary Approaches to Stop Hypertension." The DASH eating plan is a healthy eating plan that has been shown to reduce high blood pressure (hypertension). Additional health benefits may include reducing the risk of type 2 diabetes mellitus, heart disease, and stroke. The DASH eating plan may also help with weight loss. WHAT DO I NEED TO KNOW ABOUT THE DASH EATING PLAN? For the DASH eating plan, you will follow these general guidelines:  Choose foods with a percent daily value for sodium of less than 5% (as listed on the food label).  Use salt-free seasonings or herbs instead of table salt or sea salt.  Check with your health care provider or pharmacist before using salt substitutes.  Eat lower-sodium products, often labeled as "lower sodium" or "no salt added."  Eat fresh foods.  Eat more vegetables, fruits, and low-fat dairy products.  Choose whole grains. Look for the  word "whole" as the first word in the ingredient list.  Choose fish and skinless chicken or [place] more often than red meat. Limit fish, poultry, and meat to 6 oz (170 g) each day.  Limit sweets, desserts, sugars, and sugary drinks.  Choose heart-healthy fats.  Limit cheese to 1 oz (28 g) per day.  Eat more home-cooked food and less restaurant, buffet, and fast food.  Limit fried foods.  Cook foods using methods other than frying.  Limit canned vegetables. If you do use them, rinse them well to decrease the sodium.  When eating at a restaurant, ask that your food be prepared with less salt, or no salt if possible. WHAT FOODS CAN I EAT? Seek help from a dietitian for individual calorie needs. Grains Whole grain or whole wheat bread. Brown rice. Whole grain or whole wheat pasta. Quinoa, bulgur, and whole grain cereals. Low-sodium cereals. Corn or whole wheat flour tortillas. Whole grain cornbread. Whole grain crackers. Low-sodium crackers. Vegetables Fresh or frozen vegetables (raw, steamed, roasted, or grilled). Low-sodium or reduced-sodium tomato and vegetable juices. Low-sodium or reduced-sodium tomato sauce and paste. Low-sodium or reduced-sodium canned vegetables.  Fruits All fresh, canned (in natural juice), or frozen fruits. Meat and Other Protein Products Ground beef (85% or leaner), grass-fed beef, or beef trimmed of fat. Skinless chicken or [place]. Ground chicken or [place]. Pork trimmed of fat. All fish and seafood. Eggs. Dried beans, peas, or lentils. Unsalted nuts and  seeds. Unsalted canned beans. Dairy Low-fat dairy products, such as skim or 1% milk, 2% or reduced-fat cheeses, low-fat ricotta or cottage cheese, or plain low-fat yogurt. Low-sodium or reduced-sodium cheeses. Fats and Oils Tub margarines without trans fats. Light or reduced-fat mayonnaise and salad dressings (reduced sodium). Avocado. Safflower, olive, or canola oils. Natural peanut or almond  butter. Other Unsalted popcorn and pretzels. The items listed above may not be a complete list of recommended foods or beverages. Contact your dietitian for more options. WHAT FOODS ARE NOT RECOMMENDED? Grains White bread. White pasta. White rice. Refined cornbread. Bagels and croissants. Crackers that contain trans fat. Vegetables Creamed or fried vegetables. Vegetables in a cheese sauce. Regular canned vegetables. Regular canned tomato sauce and paste. Regular tomato and vegetable juices. Fruits Dried fruits. Canned fruit in light or heavy syrup. Fruit juice. Meat and Other Protein Products Fatty cuts of meat. Ribs, chicken wings, bacon, sausage, bologna, salami, chitterlings, fatback, hot dogs, bratwurst, and packaged luncheon meats. Salted nuts and seeds. Canned beans with salt. Dairy Whole or 2% milk, cream, half-and-half, and cream cheese. Whole-fat or sweetened yogurt. Full-fat cheeses or blue cheese. Nondairy creamers and whipped toppings. Processed cheese, cheese spreads, or cheese curds. Condiments Onion and garlic salt, seasoned salt, table salt, and sea salt. Canned and packaged gravies. Worcestershire sauce. Tartar sauce. Barbecue sauce. Teriyaki sauce. Soy sauce, including reduced sodium. Steak sauce. Fish sauce. Oyster sauce. Cocktail sauce. Horseradish. Ketchup and mustard. Meat flavorings and tenderizers. Bouillon cubes. Hot sauce. Tabasco sauce. Marinades. Taco seasonings. Relishes. Fats and Oils Butter, stick margarine, lard, shortening, ghee, and bacon fat. Coconut, palm kernel, or palm oils. Regular salad dressings. Other Pickles and olives. Salted popcorn and pretzels. The items listed above may not be a complete list of foods and beverages to avoid. Contact your dietitian for more information. WHERE CAN I FIND MORE INFORMATION? National Heart, Lung, and Blood Institute: travelstabloid.com Document Released: 05/09/2011 Document Revised:  10/04/2013 Document Reviewed: 03/24/2013 [facility] Patient Information 2015 [place], [place]. This information is not intended to replace advice given to you by your health care provider. Make sure you discuss any questions you have with your health care provider.     Fatigue Fatigue is feeling tired all of the time, a lack of energy, or a lack of motivation. Occasional or mild fatigue is often a normal response to activity or life in general. However, long-lasting (chronic) or extreme fatigue may indicate an underlying medical condition. Follow these instructions at home: Watch your fatigue for any changes. The following actions may help to lessen any discomfort you are feeling:  Talk to your health care provider about how much sleep you need each night. Try to get the required amount every night.  Take medicines only as directed by your health care provider.  Eat a healthy and nutritious diet. Ask your health care provider if you need help changing your diet.  Drink enough fluid to keep your urine clear or pale yellow.  Practice ways of relaxing, such as yoga, meditation, massage therapy, or acupuncture.  Exercise regularly.  Change situations that cause you stress. Try to keep your work and personal routine reasonable.  Do not abuse illegal drugs.  Limit alcohol intake to no more than 1 drink per day for nonpregnant women and 2 drinks per day for men. One drink equals 12 ounces of beer, 5 ounces of wine, or 1 ounces of hard liquor.  Take a multivitamin, if directed by your health care provider.  Contact a  health care provider if:  Your fatigue does not get better.  You have a fever.  You have unintentional weight loss or gain.  You have headaches.  You have difficulty: ? Falling asleep. ? Sleeping throughout the night.  You feel angry, guilty, anxious, or sad.  You are unable to have a bowel movement (constipation).  You skin is dry.  Your legs or another part of  your body is swollen. Get help right away if:  You feel confused.  Your vision is blurry.  You feel faint or pass out.  You have a severe headache.  You have severe abdominal, pelvic, or back pain.  You have chest pain, shortness of breath, or an irregular or fast heartbeat.  You are unable to urinate or you urinate less than normal.  You develop abnormal bleeding, such as bleeding from the rectum, vagina, nose, lungs, or nipples.  You vomit blood.  You have thoughts about harming yourself or committing suicide.  You are worried that you might harm someone else. This information is not intended to replace advice given to you by your health care provider. Make sure you discuss any questions you have with your health care provider. Document Released: 03/17/2007 Document Revised: 10/26/2015 Document Reviewed: 09/21/2013 Elsevier Interactive Patient Education  [employer].

## 2017-08-06 ENCOUNTER — Telehealth: Payer: Self-pay

## 2017-08-06 NOTE — Telephone Encounter (Signed)
Patient states that BP: 75/39 and P: 39. Patient states that she feels weak and dizzy. Advised patient to go to the ER.

## 2017-08-07 ENCOUNTER — Encounter: Payer: Self-pay | Admitting: Physician Assistant

## 2017-08-07 ENCOUNTER — Ambulatory Visit (INDEPENDENT_AMBULATORY_CARE_PROVIDER_SITE_OTHER): Payer: PPO | Admitting: Physician Assistant

## 2017-08-07 VITALS — BP 120/62 | HR 64 | Temp 98.1°F | Ht 64.0 in | Wt 165.0 lb

## 2017-08-07 DIAGNOSIS — M791 Myalgia, unspecified site: Secondary | ICD-10-CM | POA: Diagnosis not present

## 2017-08-07 DIAGNOSIS — R0989 Other specified symptoms and signs involving the circulatory and respiratory systems: Secondary | ICD-10-CM

## 2017-08-07 DIAGNOSIS — R5383 Other fatigue: Secondary | ICD-10-CM | POA: Diagnosis not present

## 2017-08-07 DIAGNOSIS — R2689 Other abnormalities of gait and mobility: Secondary | ICD-10-CM

## 2017-08-07 DIAGNOSIS — R001 Bradycardia, unspecified: Secondary | ICD-10-CM | POA: Diagnosis not present

## 2017-08-07 NOTE — Progress Notes (Signed)
Subjective:    Patient ID: Michele [name], female    DOB: Jun 12, 1945, 72 y.o.   MRN: [id number]  HPI 72 y.o. 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    She states that her HR has been beating "hard" and slow x several weeks, worse x last 2 days,so she did not take any of her BP meds this AM to try to have cataract surgery however when she got there her HR there was 37 and "erratic" so she could not have the procedure done. She has been on atenolol 25 mg BID, clonidine 0.1 BID (stopped 2/22), lisinopril 52m BID, minoxidil 180m and verapamil 12028mID.  She was recently started on her minoxidil and verapamil 03/01.  BP readings at home with HR have been 140/70, 115/53 HR 88-63, lowest BP 89/44 with HR 51, lowest HR per patient at home 42, after meds, after HR 39. Will get EKG.  She is following with neuro has followed with Dr. WilJannifer Franklinr dizziness. Has followed with Dr. HilDebara Picketthe had an echo 08/2015, stress tets and ambulatory BP 03/2016.  She complains of chronic fatigue, leg weakness, frequent naps, some imbalance with walking. She does snore, will wake her self up, will fall asleep quickly, will not wake up tired, and has several risk factors for sleep apnea.    She had recent negative labs for CPK, aldolase, PENDING CCP, lyme, sed rate, ANA, antismith,  Has had DM x 12 + years.  Lab Results  Component Value Date   HGBA1C 6.6 (H) 07/25/2017    Blood pressure 120/62, pulse 64, temperature 98.1 F (36.7 C), height _0  (1.626 m), weight 165 lb (74.8 kg), SpO2 96 %.  Medications Current Outpatient Medications on File Prior to Visit  Medication Sig  . APPLE CIDER VINEGAR PO Take by mouth.  . aMarland Kitchenpirin 325 MG EC tablet Take 325 mg by mouth daily.  . aMarland Kitchenenolol (TENORMIN) 50 MG tablet Take 0.5 tablets (25 mg total) by mouth 2 (two) times daily.  . Biotin 1000 MCG tablet Take 1,000 mcg by mouth daily. Reported on 08/22/2015   . Blood Glucose Monitoring Suppl (ONE TOUCH ULTRA SYSTEM KIT) w/Device KIT Check blood sugar 1 time daily-DX-E11.21  . Cholecalciferol (VITAMIN D PO) Take 10,000 Units by mouth daily.   . CMarland KitchenNNAMON PO Take by mouth.  . citalopram (CELEXA) 40 MG tablet Take 1 tab daily for anxiety and mood. (Patient taking differently: 20 mg. Take 92m46mb twice daily for anxiety and mood.)  . clonazePAM (KLONOPIN) 1 MG tablet TAKE 1/2 TO 1 (ONE-HALF TO ONE) TABLET BY MOUTH THREE TIMES DAILY OR  AS  DIRECTED  FOR  ANXIETY (Patient taking differently: Take one tablet by mouth in the morning and one tablet in the evening)  . cloNIDine (CATAPRES) 0.2 MG tablet Take 1 tablet (0.2 mg total) by mouth 2 (two) times daily as needed. For high blood pressure. (Patient taking differently: Take 0.2 mg by mouth 2 (two) times daily as needed. Take 1/2 of 1/2 twice daily)  . enalapril (VASOTEC) 20 MG tablet Take 1 tablet (20 mg total) 2 (two) times daily by mouth.  . Flaxseed, Linseed, (FLAXSEED OIL PO) Take 1,000 mg by mouth daily. Reported on 08/22/2015  . furosemide (LASIX) 40 MG tablet Take 1 tablet (40 mg total) daily by mouth.  . glMarland Kitchencose blood test strip Check blood sugar 2 times daily-DX-E11.21. Please dispense One Touch  Ultra test strips.  . Iron TABS Take 325 mg by mouth 2 (two) times daily.   . Lancets (ONETOUCH ULTRASOFT) lancets Check blood sugar 1 time daily-DX-E11.21  . Magnesium 500 MG TABS Take 1 tablet by mouth 3 (three) times daily.  . metFORMIN (GLUCOPHAGE-XR) 500 MG 24 hr tablet TAKE ONE TABLET BY MOUTH THREE TIMES DAILY  . minoxidil (LONITEN) 10 MG tablet TAKE 1 TABLET BY MOUTH AT SUPPERTIME  . Omega-3 Fatty Acids (FISH OIL) 1000 MG CAPS Take 1,000 mg by mouth daily. Reported on 08/22/2015  . Red Yeast Rice 600 MG CAPS Take 600 mg by mouth daily with breakfast. Reported on 08/22/2015  . rosuvastatin (CRESTOR) 20 MG tablet Take 1/2 tablet three times a week  . verapamil (CALAN) 120 MG tablet Take 1 tablet (120 mg  total) by mouth 2 (two) times daily.   No current facility-administered medications on file prior to visit.     Problem list She has Mixed hyperlipidemia; Essential hypertension; GERD; Fatty liver; COLONIC POLYPS, ADENOMATOUS, HX OF; CKD stage 3 due to type 2 diabetes mellitus ([place]); Vitamin D deficiency; Medication management; Anemia; Gout; Hyperthyroidism; Myalgia and myositis; Cerebrovascular disease; Encounter for Medicare annual wellness exam; Type 2 diabetes mellitus ([place]); Overweight (BMI 25.0-29.9); and Labile hypertension on their problem list.  Review of Systems See HPI    Objective:   Physical Exam  Constitutional: She is oriented to person, place, and time. She appears well-developed and well-nourished. No distress.  HENT:  Head: Normocephalic.  Mouth/Throat: Oropharynx is clear and moist. No oropharyngeal exudate.  Eyes: Conjunctivae are normal. No scleral icterus.  Neck: Normal range of motion. Neck supple. No JVD present. No thyromegaly present.  Cardiovascular: Normal rate, regular rhythm, normal heart sounds and intact distal pulses. Exam reveals no gallop and no friction rub.  No murmur heard. Pulmonary/Chest: Effort normal and breath sounds normal. No respiratory distress. She has no wheezes. She has no rales. She exhibits no tenderness.  Abdominal: Soft. Bowel sounds are normal. She exhibits no distension and no mass. There is no tenderness. There is no rebound and no guarding.  Musculoskeletal: Normal range of motion.  Lymphadenopathy:    She has no cervical adenopathy.  Neurological: She is alert and oriented to person, place, and time. No cranial nerve deficit or sensory deficit. Coordination normal.  Slightly unsteady gait  Skin: Skin is warm and dry. She is not diaphoretic.  Psychiatric: Her behavior is normal. Judgment and thought content normal. Her mood appears anxious. Her affect is not angry, not blunt, not labile and not inappropriate. She does not exhibit  a depressed mood.  Slight mask face  Nursing note and vitals reviewed.      Assessment & Plan:   Imbalance/fatigue- likely multifactorial  Normal neuro Please continue to monitor your blood pressure and heart rate at home Stop the verapamil, atenolol, and clonidine Continue the minoxidil and enalapril Due lasix AS needed if BP is over 150/80 ? If continue issues with BP/HR AFTER med adjustment ? If autonomic dysfunction is a component from DM versus CVA versus parkinson with mask face- will monitor  Will refer for sleep study- fatigue, labile HTN/HR, recent CVA Will refer for holter monitor off rate medications Follow up with Dr. Jannifer Franklin for imbalance  We will set you for balance and PT at home She is not driving, will refer for home health.   We will check labs labs VERY close follow up 1 week Go to the ER if any chest  pain, shortness of breath, nausea, dizziness, severe HA, changes vision/speech

## 2017-08-07 NOTE — Patient Instructions (Addendum)
Please continue to monitor your blood pressure and heart rate Stop the verapamil, atenolol, and clonidine Continue the minoxidil and enalapril   lasix AS needed for blood pressure over 150/80  Go to the ER if any chest pain, shortness of breath, nausea, dizziness, severe HA, changes vision/speech   Will refer to sleep study Will refer to holter monitor Follow up with Dr. Jannifer Franklin  We will check labs on you  We will set you up for balance and PT.  She is not driving, will refer for home health.   Follow 1 week

## 2017-08-09 LAB — ANTI-SMITH ANTIBODY: ENA SM AB SER-ACNC: NEGATIVE AI

## 2017-08-09 LAB — B. BURGDORFI ANTIBODIES

## 2017-08-09 LAB — ANA: Anti Nuclear Antibody(ANA): NEGATIVE

## 2017-08-09 LAB — SEDIMENTATION RATE: Sed Rate: 6 mm/h (ref 0–30)

## 2017-08-09 LAB — CYCLIC CITRUL PEPTIDE ANTIBODY, IGG: Cyclic Citrullin Peptide Ab: <16 UNITS

## 2017-08-12 ENCOUNTER — Ambulatory Visit (INDEPENDENT_AMBULATORY_CARE_PROVIDER_SITE_OTHER): Payer: PPO

## 2017-08-12 DIAGNOSIS — R001 Bradycardia, unspecified: Secondary | ICD-10-CM

## 2017-08-12 NOTE — Progress Notes (Deleted)
Assessment and Plan:    HPI 72 y.o.female presents for follow up. She has had very labile BP/HR since her CVA.   All AV nodal blocking meds were stopped last OV and she has been tracking her BP.  She will get sleep study.  She will follow up with Dr. Jannifer Franklin.  Autoimmune labs were negative.   Past Medical History:  Diagnosis Date  . Anxiety   . Arthritis   . Carpal tunnel syndrome   . Cerebrovascular disease 01/02/2017  . Duodenal diverticulum   . Esophageal motility disorder   . Fatty liver   . GERD (gastroesophageal reflux disease)   . Hyperlipidemia   . Hypertension   . 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 Tubulovillous adenoma of colon   . Type II or unspecified type diabetes mellitus without mention of complication, not stated as uncontrolled      Allergies  Allergen Reactions  . Lipitor [Atorvastatin]     Elevates LFT's    Current Outpatient Medications on File Prior to Visit  Medication Sig  . APPLE CIDER VINEGAR PO Take by mouth.  Marland Kitchen aspirin 325 MG EC tablet Take 325 mg by mouth daily.  . Biotin 1000 MCG tablet Take 1,000 mcg by mouth daily. Reported on 08/22/2015  . Blood Glucose Monitoring Suppl (ONE TOUCH ULTRA SYSTEM KIT) w/Device KIT Check blood sugar 1 time daily-DX-E11.21  . Cholecalciferol (VITAMIN D PO) Take 10,000 Units by mouth daily.   Marland Kitchen CINNAMON PO Take by mouth.  . citalopram (CELEXA) 40 MG tablet Take 1 tab daily for anxiety and mood. (Patient taking differently: 20 mg. Take 51m tab twice daily for anxiety and mood.)  . clonazePAM (KLONOPIN) 1 MG tablet TAKE 1/2 TO 1 (ONE-HALF TO ONE) TABLET BY MOUTH THREE TIMES DAILY OR  AS  DIRECTED  FOR  ANXIETY (Patient taking differently: Take one tablet by mouth in the morning and one tablet in the evening)  . enalapril (VASOTEC) 20 MG tablet Take 1 tablet (20 mg total) 2 (two) times daily by mouth.  . Flaxseed, Linseed, (FLAXSEED OIL PO) Take 1,000 mg by mouth daily. Reported on 08/22/2015  . furosemide (LASIX)  40 MG tablet Take 1 tablet (40 mg total) daily by mouth.  .Marland Kitchenglucose blood test strip Check blood sugar 2 times daily-DX-E11.21. Please dispense One Touch Ultra test strips.  . Iron TABS Take 325 mg by mouth 2 (two) times daily.   . Lancets (ONETOUCH ULTRASOFT) lancets Check blood sugar 1 time daily-DX-E11.21  . Magnesium 500 MG TABS Take 1 tablet by mouth 3 (three) times daily.  . metFORMIN (GLUCOPHAGE-XR) 500 MG 24 hr tablet TAKE ONE TABLET BY MOUTH THREE TIMES DAILY  . minoxidil (LONITEN) 10 MG tablet TAKE 1 TABLET BY MOUTH AT SUPPERTIME  . Omega-3 Fatty Acids (FISH OIL) 1000 MG CAPS Take 1,000 mg by mouth daily. Reported on 08/22/2015  . Red Yeast Rice 600 MG CAPS Take 600 mg by mouth daily with breakfast. Reported on 08/22/2015  . rosuvastatin (CRESTOR) 20 MG tablet Take 1/2 tablet three times a week   No current facility-administered medications on file prior to visit.     ROS: all negative except above.   Physical Exam: There were no vitals filed for this visit. There were no vitals taken for this visit. General Appearance: Well nourished, in no apparent distress. Eyes: PERRLA, EOMs, conjunctiva no swelling or erythema Sinuses: No Frontal/maxillary tenderness ENT/Mouth: Ext aud canals clear, TMs without erythema, bulging. No erythema, swelling, or exudate  on post pharynx.  Tonsils not swollen or erythematous. Hearing normal.  Neck: Supple, thyroid normal.  Respiratory: Respiratory effort normal, BS equal bilaterally without rales, rhonchi, wheezing or stridor.  Cardio: RRR with no MRGs. Brisk peripheral pulses without edema.  Abdomen: Soft, + BS.  Non tender, no guarding, rebound, hernias, masses. Lymphatics: Non tender without lymphadenopathy.  Musculoskeletal: Full ROM, 5/5 strength, normal gait.  Skin: Warm, dry without rashes, lesions, ecchymosis.  Neuro: Cranial nerves intact. Normal muscle tone, no cerebellar symptoms. Sensation intact.  Psych: Awake and oriented X 3, normal  affect, Insight and Judgment appropriate.     Vicie Mutters, PA-C 7:45 AM [facility]

## 2017-08-13 DIAGNOSIS — Z7982 Long term (current) use of aspirin: Secondary | ICD-10-CM | POA: Diagnosis not present

## 2017-08-13 DIAGNOSIS — E782 Mixed hyperlipidemia: Secondary | ICD-10-CM | POA: Diagnosis not present

## 2017-08-13 DIAGNOSIS — K760 Fatty (change of) liver, not elsewhere classified: Secondary | ICD-10-CM | POA: Diagnosis not present

## 2017-08-13 DIAGNOSIS — Z8673 Personal history of transient ischemic attack (TIA), and cerebral infarction without residual deficits: Secondary | ICD-10-CM | POA: Diagnosis not present

## 2017-08-13 DIAGNOSIS — K219 Gastro-esophageal reflux disease without esophagitis: Secondary | ICD-10-CM | POA: Diagnosis not present

## 2017-08-13 DIAGNOSIS — E1122 Type 2 diabetes mellitus with diabetic chronic kidney disease: Secondary | ICD-10-CM | POA: Diagnosis not present

## 2017-08-13 DIAGNOSIS — K224 Dyskinesia of esophagus: Secondary | ICD-10-CM | POA: Diagnosis not present

## 2017-08-13 DIAGNOSIS — F419 Anxiety disorder, unspecified: Secondary | ICD-10-CM | POA: Diagnosis not present

## 2017-08-13 DIAGNOSIS — N183 Chronic kidney disease, stage 3 (moderate): Secondary | ICD-10-CM | POA: Diagnosis not present

## 2017-08-13 DIAGNOSIS — D631 Anemia in chronic kidney disease: Secondary | ICD-10-CM | POA: Diagnosis not present

## 2017-08-13 DIAGNOSIS — M1991 Primary osteoarthritis, unspecified site: Secondary | ICD-10-CM | POA: Diagnosis not present

## 2017-08-13 DIAGNOSIS — E559 Vitamin D deficiency, unspecified: Secondary | ICD-10-CM | POA: Diagnosis not present

## 2017-08-13 DIAGNOSIS — I129 Hypertensive chronic kidney disease with stage 1 through stage 4 chronic kidney disease, or unspecified chronic kidney disease: Secondary | ICD-10-CM | POA: Diagnosis not present

## 2017-08-14 ENCOUNTER — Encounter: Payer: Self-pay | Admitting: Adult Health

## 2017-08-14 ENCOUNTER — Ambulatory Visit (INDEPENDENT_AMBULATORY_CARE_PROVIDER_SITE_OTHER): Payer: PPO | Admitting: Adult Health

## 2017-08-14 ENCOUNTER — Telehealth: Payer: Self-pay | Admitting: *Deleted

## 2017-08-14 ENCOUNTER — Ambulatory Visit: Payer: Self-pay | Admitting: Physician Assistant

## 2017-08-14 VITALS — BP 136/80 | HR 64 | Temp 97.3°F | Resp 14 | Ht 64.0 in | Wt 163.6 lb

## 2017-08-14 DIAGNOSIS — I10 Essential (primary) hypertension: Secondary | ICD-10-CM

## 2017-08-14 MED ORDER — OLMESARTAN MEDOXOMIL 40 MG PO TABS: ORAL_TABLET | ORAL | 1 refills | Status: DC

## 2017-08-14 NOTE — Progress Notes (Signed)
Assessment and Plan:  Michele [name] was seen today for follow-up.  Diagnoses and all orders for this visit:  Essential hypertension D/c enalapril, will switch to olmesartan 40 mg - continue to check BP, add 1/2 tab lasix (20 mg) if BPs remain above 130/80. Follow up in 4 weeks for recheck. Clearance for surgery will be provided to Dr. Celene Squibb to proceed with cataract extraction.  -     olmesartan (BENICAR) 40 MG tablet; Take 1 tab daily to maintain blood pressure below 130/80.  Further disposition pending results of labs. Discussed med's effects and SE's.   Over 15 minutes of exam, counseling, chart review, and critical decision making was performed.   Future Appointments  Date Time Provider [place]  09/18/2017 11:30 AM Vicie Mutters, PA-C [facility] None  11/06/2017 11:00 AM [name], MD [facility] None    ------------------------------------------------------------------------------------------------------------------  HPI BP 136/80   Pulse 64   Temp (!) 97.3 F (36.3 C)   Resp 14   Ht '5\' 4"'  (1.626 m)   Wt 163 lb 9.6 oz (74.2 kg)   SpO2 96%   BMI 28.08 kg/m   72 y.o.female presents for close 1 week follow up for hypertension/ bradycardia after pulse in 30s was noted prior to cataract surgery with Dr. Celene Squibb which was then cancelled. We discontinued her beta blocker and calcium channel blocker; she is currently on enalapril 20 mg daily and minoxidil 10 mg daily - she continues to hold lasix per our prior instructions. She has clonidine on hand if necessary for severe hypertension (hx of hypertensive crisis). She presents with home logs of BPs ranging 140s-180s/70s-80s with pulses high 60s-80s.   She has completed 48 hour Holter ordered for ongoing fatigue; results pending. She has not been scheduled for sleep study for suspected OSA.   Home PT was initiated for ongoing balance issues; was evaluated yesterday - will be completing twice weekly in home sessions for 8 weeks.    Past Medical History:  Diagnosis Date  . Anxiety   . Arthritis   . Carpal tunnel syndrome   . Cerebrovascular disease 01/02/2017  . Duodenal diverticulum   . Esophageal motility disorder   . Fatty liver   . GERD (gastroesophageal reflux disease)   . Hyperlipidemia   . Hypertension   . 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 Tubulovillous adenoma of colon   . Type II or unspecified type diabetes mellitus without mention of complication, not stated as uncontrolled      Allergies  Allergen Reactions  . Lipitor [Atorvastatin]     Elevates LFT's    Current Outpatient Medications on File Prior to Visit  Medication Sig  . APPLE CIDER VINEGAR PO Take by mouth.  Marland Kitchen aspirin 325 MG EC tablet Take 325 mg by mouth daily.  . Biotin 1000 MCG tablet Take 1,000 mcg by mouth daily. Reported on 08/22/2015  . Blood Glucose Monitoring Suppl (ONE TOUCH ULTRA SYSTEM KIT) w/Device KIT Check blood sugar 1 time daily-DX-E11.21  . Cholecalciferol (VITAMIN D PO) Take 10,000 Units by mouth daily.   Marland Kitchen CINNAMON PO Take by mouth.  . citalopram (CELEXA) 40 MG tablet Take 1 tab daily for anxiety and mood. (Patient taking differently: 20 mg. Take 69m tab twice daily for anxiety and mood.)  . clonazePAM (KLONOPIN) 1 MG tablet TAKE 1/2 TO 1 (ONE-HALF TO ONE) TABLET BY MOUTH THREE TIMES DAILY OR  AS  DIRECTED  FOR  ANXIETY (Patient taking differently: Take one tablet by mouth in the morning and one  tablet in the evening)  . Flaxseed, Linseed, (FLAXSEED OIL PO) Take 1,000 mg by mouth daily. Reported on 08/22/2015  . furosemide (LASIX) 40 MG tablet Take 1 tablet (40 mg total) daily by mouth.  Marland Kitchen glucose blood test strip Check blood sugar 2 times daily-DX-E11.21. Please dispense One Touch Ultra test strips.  . Iron TABS Take 325 mg by mouth 2 (two) times daily.   . Lancets (ONETOUCH ULTRASOFT) lancets Check blood sugar 1 time daily-DX-E11.21  . Magnesium 500 MG TABS Take 1 tablet by mouth 3 (three) times daily.  . metFORMIN  (GLUCOPHAGE-XR) 500 MG 24 hr tablet TAKE ONE TABLET BY MOUTH THREE TIMES DAILY  . minoxidil (LONITEN) 10 MG tablet TAKE 1 TABLET BY MOUTH AT SUPPERTIME  . Omega-3 Fatty Acids (FISH OIL) 1000 MG CAPS Take 1,000 mg by mouth daily. Reported on 08/22/2015  . Red Yeast Rice 600 MG CAPS Take 600 mg by mouth daily with breakfast. Reported on 08/22/2015  . rosuvastatin (CRESTOR) 20 MG tablet Take 1/2 tablet three times a week   No current facility-administered medications on file prior to visit.     ROS: Review of Systems  HENT: Negative.   Eyes: Negative for blurred vision and double vision.  Respiratory: Negative for shortness of breath.   Cardiovascular: Negative for chest pain, palpitations, orthopnea, claudication, leg swelling and PND.  Musculoskeletal: Negative for falls.  Neurological: Negative for dizziness, sensory change and headaches.    Physical Exam:  BP 136/80   Pulse 64   Temp (!) 97.3 F (36.3 C)   Resp 14   Ht '5\' 4"'  (1.626 m)   Wt 163 lb 9.6 oz (74.2 kg)   SpO2 96%   BMI 28.08 kg/m   General Appearance: Well nourished, in no apparent distress. Eyes: PERRLA, EOMs, conjunctiva no swelling or erythema ENT/Mouth: Hearing normal.  Neck: Supple.  Respiratory: Respiratory effort normal, BS equal bilaterally without rales, rhonchi, wheezing or stridor.  Cardio: RRR with no MRGs. Brisk peripheral pulses without edema.  Musculoskeletal: slow gait.  Psych: Awake and oriented X 3, normal affect, Insight and Judgment appropriate.    Izora Ribas, NP 3:38 PM [facility]

## 2017-08-14 NOTE — Telephone Encounter (Signed)
A message was left for verbal OK for physical therapy 2 times a week x 8 weeks.

## 2017-08-14 NOTE — Patient Instructions (Addendum)
Stop enalapril, start benecar/olmesatan instead - if your blood pressure is still elevated (above 140/90) take 1/2 tab of lasix.   Olmesartan tablets What is this medicine? OLMESARTAN (all mi SAR tan) is used to treat high blood pressure. This medicine may be used for other purposes; ask your health care provider or pharmacist if you have questions. COMMON BRAND NAME(S): Benicar What should I tell my health care provider before I take this medicine? They need to know if you have any of these conditions: -if you are on a special diet, such as a low-salt diet -kidney or liver disease -an unusual or allergic reaction to olmesartan, other medicines, foods, dyes, or preservatives -pregnant or trying to get pregnant -breast-feeding How should I use this medicine? Take this medicine by mouth with a glass of water. Follow the directions on the prescription label. This medicine can be taken with or without food. Take your doses at regular intervals. Do not take your medicine more often than directed. Do not stop taking except on the advice of your doctor or health care professional. Talk to your pediatrician regarding the use of this medicine in children. While this drug may be prescribed for children as young as 6 years for selected conditions, precautions do apply. Overdosage: If you think you have taken too much of this medicine contact a poison control center or emergency room at once. NOTE: This medicine is only for you. Do not share this medicine with others. What if I miss a dose? If you miss a dose, take it as soon as you can. If it is almost time for your next dose, take only that dose. Do not take double or extra doses. What may interact with this medicine? -blood pressure medicines -diuretics, especially triamterene, spironolactone or amiloride -potassium salts or potassium supplements This list may not describe all possible interactions. Give your health care provider a list of all the  medicines, herbs, non-prescription drugs, or dietary supplements you use. Also tell them if you smoke, drink alcohol, or use illegal drugs. Some items may interact with your medicine. What should I watch for while using this medicine? Visit your doctor or health care professional for regular checks on your progress. Check your blood pressure as directed. Ask your doctor or health care professional what your blood pressure should be and when you should contact him or her. Call your doctor or health care professional if you notice an irregular or fast heart beat. Women should inform their doctor if they wish to become pregnant or think they might be pregnant. There is a potential for serious side effects to an unborn child, particularly in the second or third trimester. Talk to your health care professional or pharmacist for more information. You may get drowsy or dizzy. Do not drive, use machinery, or do anything that needs mental alertness until you know how this drug affects you. Do not stand or sit up quickly, especially if you are an older patient. This reduces the risk of dizzy or fainting spells. Alcohol can make you more drowsy and dizzy. Avoid alcoholic drinks. Avoid salt substitutes unless you are told otherwise by your doctor or health care professional. Do not treat yourself for coughs, colds, or pain while you are taking this medicine without asking your doctor or health care professional for advice. Some ingredients may increase your blood pressure. What side effects may I notice from receiving this medicine? Side effects that you should report to your doctor or health care professional as  soon as possible: -confusion, dizziness, light headedness or fainting spells -decreased amount of urine passed -diarrhea -difficulty breathing or swallowing, hoarseness, or tightening of the throat -fast or irregular heart beat, palpitations, or chest pain -skin rash, itching -swelling of your face,  lips, tongue, hands, or feet -vomiting -weight loss Side effects that usually do not require medical attention (report to your doctor or health care professional if they continue or are bothersome): -cough -decreased sexual function or desire -headache -nasal congestion or stuffiness -nausea -sore or cramping muscles This list may not describe all possible side effects. Call your doctor for medical advice about side effects. You may report side effects to FDA at 1-800-FDA-1088. Where should I keep my medicine? Keep out of the reach of children. Store your medicine at room temperature between 20 and 25 degrees C (68 and 77 degrees F). Throw away any unused medicine after the expiration date. NOTE: This sheet is a summary. It may not cover all possible information. If you have questions about this medicine, talk to your doctor, pharmacist, or health care provider.  2018 [employer]vier/Gold Standard (2011-12-04 13:02:23)

## 2017-08-14 NOTE — Telephone Encounter (Signed)
Patient returned holter monitor today but the top of the monitor was missing.  LM to ask patient to search the area where she removed the monitor for the small plastic top.  Please call back at [phone] and leave a telephone message.

## 2017-08-15 ENCOUNTER — Other Ambulatory Visit: Payer: Self-pay | Admitting: Internal Medicine

## 2017-08-15 NOTE — Telephone Encounter (Signed)
      Fri 6:15 PM - leaving a funeral & BP elevated sys 190. Off Enalapril x 24 hr in anticipation of starting Benicar. - [facility] out of new Rx for Benicar for several days. Advised resume enalapril 20 mg bid til receives Benicar and also use her rescue Clonidine 0.1 Mg x 1/2 tab every 4 hr prn BP>160. Advised call back if questions - bill mck

## 2017-08-17 ENCOUNTER — Other Ambulatory Visit: Payer: Self-pay | Admitting: Internal Medicine

## 2017-08-17 MED ORDER — TELMISARTAN 80 MG PO TABS: ORAL_TABLET | ORAL | 1 refills | Status: DC

## 2017-08-21 DIAGNOSIS — D631 Anemia in chronic kidney disease: Secondary | ICD-10-CM | POA: Diagnosis not present

## 2017-08-21 DIAGNOSIS — Z7982 Long term (current) use of aspirin: Secondary | ICD-10-CM | POA: Diagnosis not present

## 2017-08-21 DIAGNOSIS — N183 Chronic kidney disease, stage 3 (moderate): Secondary | ICD-10-CM | POA: Diagnosis not present

## 2017-08-21 DIAGNOSIS — M1991 Primary osteoarthritis, unspecified site: Secondary | ICD-10-CM | POA: Diagnosis not present

## 2017-08-21 DIAGNOSIS — K224 Dyskinesia of esophagus: Secondary | ICD-10-CM | POA: Diagnosis not present

## 2017-08-21 DIAGNOSIS — K219 Gastro-esophageal reflux disease without esophagitis: Secondary | ICD-10-CM | POA: Diagnosis not present

## 2017-08-21 DIAGNOSIS — E782 Mixed hyperlipidemia: Secondary | ICD-10-CM | POA: Diagnosis not present

## 2017-08-21 DIAGNOSIS — E559 Vitamin D deficiency, unspecified: Secondary | ICD-10-CM | POA: Diagnosis not present

## 2017-08-21 DIAGNOSIS — K760 Fatty (change of) liver, not elsewhere classified: Secondary | ICD-10-CM | POA: Diagnosis not present

## 2017-08-21 DIAGNOSIS — Z8673 Personal history of transient ischemic attack (TIA), and cerebral infarction without residual deficits: Secondary | ICD-10-CM | POA: Diagnosis not present

## 2017-08-21 DIAGNOSIS — E1122 Type 2 diabetes mellitus with diabetic chronic kidney disease: Secondary | ICD-10-CM | POA: Diagnosis not present

## 2017-08-21 DIAGNOSIS — F419 Anxiety disorder, unspecified: Secondary | ICD-10-CM | POA: Diagnosis not present

## 2017-08-21 DIAGNOSIS — I129 Hypertensive chronic kidney disease with stage 1 through stage 4 chronic kidney disease, or unspecified chronic kidney disease: Secondary | ICD-10-CM | POA: Diagnosis not present

## 2017-08-26 ENCOUNTER — Encounter: Payer: Self-pay | Admitting: Adult Health

## 2017-08-26 ENCOUNTER — Encounter: Payer: Self-pay | Admitting: Internal Medicine

## 2017-08-27 DIAGNOSIS — Z7982 Long term (current) use of aspirin: Secondary | ICD-10-CM | POA: Diagnosis not present

## 2017-08-27 DIAGNOSIS — E782 Mixed hyperlipidemia: Secondary | ICD-10-CM | POA: Diagnosis not present

## 2017-08-27 DIAGNOSIS — K224 Dyskinesia of esophagus: Secondary | ICD-10-CM | POA: Diagnosis not present

## 2017-08-27 DIAGNOSIS — F419 Anxiety disorder, unspecified: Secondary | ICD-10-CM | POA: Diagnosis not present

## 2017-08-27 DIAGNOSIS — N183 Chronic kidney disease, stage 3 (moderate): Secondary | ICD-10-CM | POA: Diagnosis not present

## 2017-08-27 DIAGNOSIS — E1122 Type 2 diabetes mellitus with diabetic chronic kidney disease: Secondary | ICD-10-CM | POA: Diagnosis not present

## 2017-08-27 DIAGNOSIS — K760 Fatty (change of) liver, not elsewhere classified: Secondary | ICD-10-CM | POA: Diagnosis not present

## 2017-08-27 DIAGNOSIS — D631 Anemia in chronic kidney disease: Secondary | ICD-10-CM | POA: Diagnosis not present

## 2017-08-27 DIAGNOSIS — Z8673 Personal history of transient ischemic attack (TIA), and cerebral infarction without residual deficits: Secondary | ICD-10-CM | POA: Diagnosis not present

## 2017-08-27 DIAGNOSIS — M1991 Primary osteoarthritis, unspecified site: Secondary | ICD-10-CM | POA: Diagnosis not present

## 2017-08-27 DIAGNOSIS — K219 Gastro-esophageal reflux disease without esophagitis: Secondary | ICD-10-CM | POA: Diagnosis not present

## 2017-08-27 DIAGNOSIS — I129 Hypertensive chronic kidney disease with stage 1 through stage 4 chronic kidney disease, or unspecified chronic kidney disease: Secondary | ICD-10-CM | POA: Diagnosis not present

## 2017-08-27 DIAGNOSIS — E559 Vitamin D deficiency, unspecified: Secondary | ICD-10-CM | POA: Diagnosis not present

## 2017-09-02 DIAGNOSIS — I129 Hypertensive chronic kidney disease with stage 1 through stage 4 chronic kidney disease, or unspecified chronic kidney disease: Secondary | ICD-10-CM | POA: Diagnosis not present

## 2017-09-02 DIAGNOSIS — K224 Dyskinesia of esophagus: Secondary | ICD-10-CM | POA: Diagnosis not present

## 2017-09-02 DIAGNOSIS — E782 Mixed hyperlipidemia: Secondary | ICD-10-CM | POA: Diagnosis not present

## 2017-09-02 DIAGNOSIS — M1991 Primary osteoarthritis, unspecified site: Secondary | ICD-10-CM | POA: Diagnosis not present

## 2017-09-02 DIAGNOSIS — E559 Vitamin D deficiency, unspecified: Secondary | ICD-10-CM | POA: Diagnosis not present

## 2017-09-02 DIAGNOSIS — K760 Fatty (change of) liver, not elsewhere classified: Secondary | ICD-10-CM | POA: Diagnosis not present

## 2017-09-02 DIAGNOSIS — Z8673 Personal history of transient ischemic attack (TIA), and cerebral infarction without residual deficits: Secondary | ICD-10-CM | POA: Diagnosis not present

## 2017-09-02 DIAGNOSIS — E1122 Type 2 diabetes mellitus with diabetic chronic kidney disease: Secondary | ICD-10-CM | POA: Diagnosis not present

## 2017-09-02 DIAGNOSIS — F419 Anxiety disorder, unspecified: Secondary | ICD-10-CM | POA: Diagnosis not present

## 2017-09-02 DIAGNOSIS — Z7982 Long term (current) use of aspirin: Secondary | ICD-10-CM | POA: Diagnosis not present

## 2017-09-02 DIAGNOSIS — D631 Anemia in chronic kidney disease: Secondary | ICD-10-CM | POA: Diagnosis not present

## 2017-09-02 DIAGNOSIS — N183 Chronic kidney disease, stage 3 (moderate): Secondary | ICD-10-CM | POA: Diagnosis not present

## 2017-09-02 DIAGNOSIS — K219 Gastro-esophageal reflux disease without esophagitis: Secondary | ICD-10-CM | POA: Diagnosis not present

## 2017-09-09 DIAGNOSIS — K760 Fatty (change of) liver, not elsewhere classified: Secondary | ICD-10-CM | POA: Diagnosis not present

## 2017-09-09 DIAGNOSIS — I129 Hypertensive chronic kidney disease with stage 1 through stage 4 chronic kidney disease, or unspecified chronic kidney disease: Secondary | ICD-10-CM | POA: Diagnosis not present

## 2017-09-09 DIAGNOSIS — F419 Anxiety disorder, unspecified: Secondary | ICD-10-CM | POA: Diagnosis not present

## 2017-09-09 DIAGNOSIS — Z7982 Long term (current) use of aspirin: Secondary | ICD-10-CM | POA: Diagnosis not present

## 2017-09-09 DIAGNOSIS — E782 Mixed hyperlipidemia: Secondary | ICD-10-CM | POA: Diagnosis not present

## 2017-09-09 DIAGNOSIS — Z8673 Personal history of transient ischemic attack (TIA), and cerebral infarction without residual deficits: Secondary | ICD-10-CM | POA: Diagnosis not present

## 2017-09-09 DIAGNOSIS — M1991 Primary osteoarthritis, unspecified site: Secondary | ICD-10-CM | POA: Diagnosis not present

## 2017-09-09 DIAGNOSIS — N183 Chronic kidney disease, stage 3 (moderate): Secondary | ICD-10-CM | POA: Diagnosis not present

## 2017-09-09 DIAGNOSIS — K224 Dyskinesia of esophagus: Secondary | ICD-10-CM | POA: Diagnosis not present

## 2017-09-09 DIAGNOSIS — E559 Vitamin D deficiency, unspecified: Secondary | ICD-10-CM | POA: Diagnosis not present

## 2017-09-09 DIAGNOSIS — K219 Gastro-esophageal reflux disease without esophagitis: Secondary | ICD-10-CM | POA: Diagnosis not present

## 2017-09-09 DIAGNOSIS — E1122 Type 2 diabetes mellitus with diabetic chronic kidney disease: Secondary | ICD-10-CM | POA: Diagnosis not present

## 2017-09-09 DIAGNOSIS — D631 Anemia in chronic kidney disease: Secondary | ICD-10-CM | POA: Diagnosis not present

## 2017-09-12 DIAGNOSIS — M1991 Primary osteoarthritis, unspecified site: Secondary | ICD-10-CM | POA: Diagnosis not present

## 2017-09-12 DIAGNOSIS — E559 Vitamin D deficiency, unspecified: Secondary | ICD-10-CM | POA: Diagnosis not present

## 2017-09-12 DIAGNOSIS — E1122 Type 2 diabetes mellitus with diabetic chronic kidney disease: Secondary | ICD-10-CM | POA: Diagnosis not present

## 2017-09-12 DIAGNOSIS — K219 Gastro-esophageal reflux disease without esophagitis: Secondary | ICD-10-CM | POA: Diagnosis not present

## 2017-09-12 DIAGNOSIS — Z7982 Long term (current) use of aspirin: Secondary | ICD-10-CM | POA: Diagnosis not present

## 2017-09-12 DIAGNOSIS — Z8673 Personal history of transient ischemic attack (TIA), and cerebral infarction without residual deficits: Secondary | ICD-10-CM | POA: Diagnosis not present

## 2017-09-12 DIAGNOSIS — D631 Anemia in chronic kidney disease: Secondary | ICD-10-CM | POA: Diagnosis not present

## 2017-09-12 DIAGNOSIS — K760 Fatty (change of) liver, not elsewhere classified: Secondary | ICD-10-CM | POA: Diagnosis not present

## 2017-09-12 DIAGNOSIS — K224 Dyskinesia of esophagus: Secondary | ICD-10-CM | POA: Diagnosis not present

## 2017-09-12 DIAGNOSIS — E782 Mixed hyperlipidemia: Secondary | ICD-10-CM | POA: Diagnosis not present

## 2017-09-12 DIAGNOSIS — N183 Chronic kidney disease, stage 3 (moderate): Secondary | ICD-10-CM | POA: Diagnosis not present

## 2017-09-12 DIAGNOSIS — F419 Anxiety disorder, unspecified: Secondary | ICD-10-CM | POA: Diagnosis not present

## 2017-09-12 DIAGNOSIS — I129 Hypertensive chronic kidney disease with stage 1 through stage 4 chronic kidney disease, or unspecified chronic kidney disease: Secondary | ICD-10-CM | POA: Diagnosis not present

## 2017-09-15 ENCOUNTER — Other Ambulatory Visit: Payer: Self-pay | Admitting: Internal Medicine

## 2017-09-15 DIAGNOSIS — R450 Nervousness: Secondary | ICD-10-CM

## 2017-09-15 MED ORDER — CLONAZEPAM 1 MG PO TABS: ORAL_TABLET | ORAL | 2 refills | Status: DC

## 2017-09-15 MED ORDER — CLONAZEPAM 1 MG PO TABS: ORAL_TABLET | ORAL | 0 refills | Status: DC

## 2017-09-16 NOTE — Progress Notes (Signed)
Assessment and Plan:  Michele [name] was seen today for follow-up.  Diagnoses and all orders for this visit:  Essential hypertension Will increase verampail to 286m daily, monitor BP, continue minoxidil and benicar Monitor BP at home Cleared for surgery -     olmesartan (BENICAR) 40 MG tablet; Take 1 tab daily to maintain blood pressure below 130/80.  Further disposition pending results of labs. Discussed med's effects and SE's.   Over 15 minutes of exam, counseling, chart review, and critical decision making was performed.    Future Appointments  Date Time Provider [place] 10/16/2017 10:00 AM Dohmeier, CAsencion Partridge MD [facility] None  11/06/2017 11:00 AM MUnk Pinto MD [facility] None    ------------------------------------------------------------------------------------------------------------------  HPI BP 118/66   Pulse 74   Temp (!) 97.4 F (36.3 C)   Resp 16   Ht '5\' 4"'  (1.626 m)   Wt 156 lb 3.2 oz (70.9 kg)   SpO2 98%   BMI 26.81 kg/m   72 y.o.female presents for 1 month follow up for hypertension/ bradycardia. Her BB and CCB were discontinued after bradycardia noted prior to cataract surgery with Dr. MCelene Squibbwhich was then cancelled.  She presents with still elevated BP but better HR readings, last visit switched from enalapril to benicar 1/2 pill a day and still on minoxidil 10 mg daily- she is on the benicar a whole pill - She presents with home logs of BPs ranging 140s-150s/70s-80s with pulses high 80-90's.   She has completed 48 hour Holter ordered for ongoing fatigue that was normal.  We are still pending a sleep study to evaluate for OSA.    Past Medical History:  Diagnosis Date  . Anxiety   . Arthritis   . Carpal tunnel syndrome   . Cerebrovascular disease 01/02/2017  . Duodenal diverticulum   . Esophageal motility disorder   . Fatty liver   . GERD (gastroesophageal reflux disease)   . Hyperlipidemia   . Hypertension   . 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  Tubulovillous adenoma of colon   . Type II or unspecified type diabetes mellitus without mention of complication, not stated as uncontrolled      Allergies  Allergen Reactions  . Lipitor [Atorvastatin]     Elevates LFT's    Current Outpatient Medications on File Prior to Visit  Medication Sig  . APPLE CIDER VINEGAR PO Take by mouth.  .Marland Kitchenaspirin 325 MG EC tablet Take 325 mg by mouth daily.  . Biotin 1000 MCG tablet Take 1,000 mcg by mouth daily. Reported on 08/22/2015  . Blood Glucose Monitoring Suppl (ONE TOUCH ULTRA SYSTEM KIT) w/Device KIT Check blood sugar 1 time daily-DX-E11.21  . Cholecalciferol (VITAMIN D PO) Take 10,000 Units by mouth daily.   .Marland KitchenCINNAMON PO Take by mouth.  . citalopram (CELEXA) 40 MG tablet Take 1 tab daily for anxiety and mood. (Patient taking differently: 20 mg. Take 225mtab twice daily for anxiety and mood.)  . clonazePAM (KLONOPIN) 1 MG tablet Take 1/2 to 1 tablet 2 to 3 x / day & please try to limit to 5 days /week to avoid addiction  . Flaxseed, Linseed, (FLAXSEED OIL PO) Take 1,000 mg by mouth daily. Reported on 08/22/2015  . furosemide (LASIX) 40 MG tablet Take 1 tablet (40 mg total) daily by mouth.  . Marland Kitchenlucose blood test strip Check blood sugar 2 times daily-DX-E11.21. Please dispense One Touch Ultra test strips.  . Iron TABS Take 325 mg by mouth 2 (two) times daily.   .Marland Kitchen  Lancets (ONETOUCH ULTRASOFT) lancets Check blood sugar 1 time daily-DX-E11.21  . Magnesium 500 MG TABS Take 1 tablet by mouth 3 (three) times daily.  . metFORMIN (GLUCOPHAGE-XR) 500 MG 24 hr tablet TAKE ONE TABLET BY MOUTH THREE TIMES DAILY  . minoxidil (LONITEN) 10 MG tablet TAKE 1 TABLET BY MOUTH AT SUPPERTIME  . Omega-3 Fatty Acids (FISH OIL) 1000 MG CAPS Take 1,000 mg by mouth daily. Reported on 08/22/2015  . rosuvastatin (CRESTOR) 20 MG tablet Take 1/2 tablet three times a week  . telmisartan (MICARDIS) 80 MG tablet Take 1 tablet daily for BP   No current facility-administered  medications on file prior to visit.     ROS: Review of Systems  HENT: Negative.   Eyes: Negative for blurred vision and double vision.  Respiratory: Negative for shortness of breath.   Cardiovascular: Negative for chest pain, palpitations, orthopnea, claudication, leg swelling and PND.  Musculoskeletal: Negative for falls.  Neurological: Negative for dizziness, sensory change and headaches.    Physical Exam:  BP 118/66   Pulse 74   Temp (!) 97.4 F (36.3 C)   Resp 16   Ht '5\' 4"'  (1.626 m)   Wt 156 lb 3.2 oz (70.9 kg)   SpO2 98%   BMI 26.81 kg/m   General Appearance: Well nourished, in no apparent distress. Eyes: PERRLA, EOMs, conjunctiva no swelling or erythema ENT/Mouth: Hearing normal.  Neck: Supple.  Respiratory: Respiratory effort normal, BS equal bilaterally without rales, rhonchi, wheezing or stridor.  Cardio: RRR with no MRGs. Brisk peripheral pulses without edema.  Musculoskeletal: slow gait.  Psych: Awake and oriented X 3, normal affect, Insight and Judgment appropriate.    Vicie Mutters, PA-C 5:26 PM [facility]

## 2017-09-18 ENCOUNTER — Encounter: Payer: Self-pay | Admitting: Internal Medicine

## 2017-09-18 ENCOUNTER — Ambulatory Visit (INDEPENDENT_AMBULATORY_CARE_PROVIDER_SITE_OTHER): Payer: PPO | Admitting: Physician Assistant

## 2017-09-18 ENCOUNTER — Encounter: Payer: Self-pay | Admitting: Physician Assistant

## 2017-09-18 VITALS — BP 118/66 | HR 74 | Temp 97.4°F | Resp 16 | Ht 64.0 in | Wt 156.2 lb

## 2017-09-18 DIAGNOSIS — E559 Vitamin D deficiency, unspecified: Secondary | ICD-10-CM | POA: Diagnosis not present

## 2017-09-18 DIAGNOSIS — K224 Dyskinesia of esophagus: Secondary | ICD-10-CM | POA: Diagnosis not present

## 2017-09-18 DIAGNOSIS — I10 Essential (primary) hypertension: Secondary | ICD-10-CM | POA: Diagnosis not present

## 2017-09-18 DIAGNOSIS — E0590 Thyrotoxicosis, unspecified without thyrotoxic crisis or storm: Secondary | ICD-10-CM | POA: Diagnosis not present

## 2017-09-18 DIAGNOSIS — E1122 Type 2 diabetes mellitus with diabetic chronic kidney disease: Secondary | ICD-10-CM

## 2017-09-18 DIAGNOSIS — N183 Chronic kidney disease, stage 3 unspecified: Secondary | ICD-10-CM

## 2017-09-18 DIAGNOSIS — Z8673 Personal history of transient ischemic attack (TIA), and cerebral infarction without residual deficits: Secondary | ICD-10-CM | POA: Diagnosis not present

## 2017-09-18 DIAGNOSIS — Z7982 Long term (current) use of aspirin: Secondary | ICD-10-CM | POA: Diagnosis not present

## 2017-09-18 DIAGNOSIS — F419 Anxiety disorder, unspecified: Secondary | ICD-10-CM | POA: Diagnosis not present

## 2017-09-18 DIAGNOSIS — E782 Mixed hyperlipidemia: Secondary | ICD-10-CM | POA: Diagnosis not present

## 2017-09-18 DIAGNOSIS — E1121 Type 2 diabetes mellitus with diabetic nephropathy: Secondary | ICD-10-CM

## 2017-09-18 DIAGNOSIS — K219 Gastro-esophageal reflux disease without esophagitis: Secondary | ICD-10-CM | POA: Diagnosis not present

## 2017-09-18 DIAGNOSIS — Z79899 Other long term (current) drug therapy: Secondary | ICD-10-CM

## 2017-09-18 DIAGNOSIS — K760 Fatty (change of) liver, not elsewhere classified: Secondary | ICD-10-CM | POA: Diagnosis not present

## 2017-09-18 DIAGNOSIS — I129 Hypertensive chronic kidney disease with stage 1 through stage 4 chronic kidney disease, or unspecified chronic kidney disease: Secondary | ICD-10-CM | POA: Diagnosis not present

## 2017-09-18 DIAGNOSIS — D631 Anemia in chronic kidney disease: Secondary | ICD-10-CM | POA: Diagnosis not present

## 2017-09-18 DIAGNOSIS — M1991 Primary osteoarthritis, unspecified site: Secondary | ICD-10-CM | POA: Diagnosis not present

## 2017-09-18 MED ORDER — VERAPAMIL HCL ER 240 MG PO TBCR: 240.0000 mg | EXTENDED_RELEASE_TABLET | Freq: Every day | ORAL | 3 refills | Status: DC

## 2017-09-18 NOTE — Patient Instructions (Addendum)
   The studies for CBD are better for seizure disorders than any other claims for CBD oil.  The biggest issues with it, is CBD oil is NOT regulated. A recent test of 18 over the counter CBD oil showed that 20% had TOO much, 60 % did not have the claimed amount, and 20 % had the claimed amount.   I have had some patients where it interacts with their medications as well and there is not a way for me to check the interactions since it is not a controlled substance.   With any other the counter medication OR supplement, if you try it, try to get from good source and if you have ANY abnormal symptoms over the next 3 months or don't see any benefits from it stop it.   The quality and quanity of the CBD oil varies greatly so I can not endorse patients taking it at this time.  Continue the benicar and minoxidil Start on veramapil 2 pills a day or total of 240mg  a day- this will bring down your BP some and help control your heart rate better.

## 2017-09-23 DIAGNOSIS — E782 Mixed hyperlipidemia: Secondary | ICD-10-CM | POA: Diagnosis not present

## 2017-09-23 DIAGNOSIS — Z8673 Personal history of transient ischemic attack (TIA), and cerebral infarction without residual deficits: Secondary | ICD-10-CM | POA: Diagnosis not present

## 2017-09-23 DIAGNOSIS — K224 Dyskinesia of esophagus: Secondary | ICD-10-CM | POA: Diagnosis not present

## 2017-09-23 DIAGNOSIS — N183 Chronic kidney disease, stage 3 (moderate): Secondary | ICD-10-CM | POA: Diagnosis not present

## 2017-09-23 DIAGNOSIS — K760 Fatty (change of) liver, not elsewhere classified: Secondary | ICD-10-CM | POA: Diagnosis not present

## 2017-09-23 DIAGNOSIS — K219 Gastro-esophageal reflux disease without esophagitis: Secondary | ICD-10-CM | POA: Diagnosis not present

## 2017-09-23 DIAGNOSIS — F419 Anxiety disorder, unspecified: Secondary | ICD-10-CM | POA: Diagnosis not present

## 2017-09-23 DIAGNOSIS — Z7982 Long term (current) use of aspirin: Secondary | ICD-10-CM | POA: Diagnosis not present

## 2017-09-23 DIAGNOSIS — E1122 Type 2 diabetes mellitus with diabetic chronic kidney disease: Secondary | ICD-10-CM | POA: Diagnosis not present

## 2017-09-23 DIAGNOSIS — I129 Hypertensive chronic kidney disease with stage 1 through stage 4 chronic kidney disease, or unspecified chronic kidney disease: Secondary | ICD-10-CM | POA: Diagnosis not present

## 2017-09-23 DIAGNOSIS — E559 Vitamin D deficiency, unspecified: Secondary | ICD-10-CM | POA: Diagnosis not present

## 2017-09-23 DIAGNOSIS — D631 Anemia in chronic kidney disease: Secondary | ICD-10-CM | POA: Diagnosis not present

## 2017-09-23 DIAGNOSIS — M1991 Primary osteoarthritis, unspecified site: Secondary | ICD-10-CM | POA: Diagnosis not present

## 2017-09-25 DIAGNOSIS — H25811 Combined forms of age-related cataract, right eye: Secondary | ICD-10-CM | POA: Diagnosis not present

## 2017-09-25 DIAGNOSIS — H2511 Age-related nuclear cataract, right eye: Secondary | ICD-10-CM | POA: Diagnosis not present

## 2017-09-30 DIAGNOSIS — E559 Vitamin D deficiency, unspecified: Secondary | ICD-10-CM | POA: Diagnosis not present

## 2017-09-30 DIAGNOSIS — Z8673 Personal history of transient ischemic attack (TIA), and cerebral infarction without residual deficits: Secondary | ICD-10-CM | POA: Diagnosis not present

## 2017-09-30 DIAGNOSIS — E782 Mixed hyperlipidemia: Secondary | ICD-10-CM | POA: Diagnosis not present

## 2017-09-30 DIAGNOSIS — F419 Anxiety disorder, unspecified: Secondary | ICD-10-CM | POA: Diagnosis not present

## 2017-09-30 DIAGNOSIS — D631 Anemia in chronic kidney disease: Secondary | ICD-10-CM | POA: Diagnosis not present

## 2017-09-30 DIAGNOSIS — K219 Gastro-esophageal reflux disease without esophagitis: Secondary | ICD-10-CM | POA: Diagnosis not present

## 2017-09-30 DIAGNOSIS — E1122 Type 2 diabetes mellitus with diabetic chronic kidney disease: Secondary | ICD-10-CM | POA: Diagnosis not present

## 2017-09-30 DIAGNOSIS — K760 Fatty (change of) liver, not elsewhere classified: Secondary | ICD-10-CM | POA: Diagnosis not present

## 2017-09-30 DIAGNOSIS — N183 Chronic kidney disease, stage 3 (moderate): Secondary | ICD-10-CM | POA: Diagnosis not present

## 2017-09-30 DIAGNOSIS — I129 Hypertensive chronic kidney disease with stage 1 through stage 4 chronic kidney disease, or unspecified chronic kidney disease: Secondary | ICD-10-CM | POA: Diagnosis not present

## 2017-09-30 DIAGNOSIS — M1991 Primary osteoarthritis, unspecified site: Secondary | ICD-10-CM | POA: Diagnosis not present

## 2017-09-30 DIAGNOSIS — Z7982 Long term (current) use of aspirin: Secondary | ICD-10-CM | POA: Diagnosis not present

## 2017-09-30 DIAGNOSIS — K224 Dyskinesia of esophagus: Secondary | ICD-10-CM | POA: Diagnosis not present

## 2017-10-07 ENCOUNTER — Institutional Professional Consult (permissible substitution): Payer: PPO | Admitting: Neurology

## 2017-10-16 ENCOUNTER — Ambulatory Visit (INDEPENDENT_AMBULATORY_CARE_PROVIDER_SITE_OTHER): Payer: PPO | Admitting: Neurology

## 2017-10-16 ENCOUNTER — Encounter: Payer: Self-pay | Admitting: Neurology

## 2017-10-16 VITALS — BP 90/56 | HR 45 | Ht 64.0 in | Wt 155.0 lb

## 2017-10-16 DIAGNOSIS — E1122 Type 2 diabetes mellitus with diabetic chronic kidney disease: Secondary | ICD-10-CM | POA: Diagnosis not present

## 2017-10-16 DIAGNOSIS — N183 Chronic kidney disease, stage 3 unspecified: Secondary | ICD-10-CM

## 2017-10-16 DIAGNOSIS — G4719 Other hypersomnia: Secondary | ICD-10-CM

## 2017-10-16 DIAGNOSIS — F3289 Other specified depressive episodes: Secondary | ICD-10-CM | POA: Diagnosis not present

## 2017-10-16 DIAGNOSIS — R270 Ataxia, unspecified: Secondary | ICD-10-CM | POA: Diagnosis not present

## 2017-10-16 DIAGNOSIS — I679 Cerebrovascular disease, unspecified: Secondary | ICD-10-CM

## 2017-10-16 DIAGNOSIS — I129 Hypertensive chronic kidney disease with stage 1 through stage 4 chronic kidney disease, or unspecified chronic kidney disease: Secondary | ICD-10-CM

## 2017-10-16 MED ORDER — ALPRAZOLAM 0.5 MG PO TABS: 0.5000 mg | ORAL_TABLET | Freq: Every evening | ORAL | 0 refills | Status: DC | PRN

## 2017-10-16 NOTE — Progress Notes (Signed)
SLEEP MEDICINE CLINIC   Provider:  Larey Seat, M D  Primary Care Physician:  [name], MD   Referring Provider: [name], MD , Vicie Mutters, PA.  Chief Complaint  Patient presents with  . Follow-up    Sleep consult, fatigue pt is dizzy room 11, Pt is with husband Trilby Drummer  internal referral     HPI:  Michele [name] is a 72 y.o. female , seen here as in referral  from Dr. Melford Aase for a sleep evaluation,  Chief complaint according to patient : " I am needing a nap in daytime,and I am always sleepy and tired".   Michele [name] had been evaluated for neurovascular disease by my colleague Dr. Jannifer Franklin to whom [name] also referred for further work-up of gait disorder and balance problems.   She also initiated a sleep work-up as the patient has felt tired excessively sleepy in daytime for the last 2 or 3 years, correlated with her admission to hospital.  At the time she had been suffering from very high blood pressures malignant hypertension and she has not felt ever the same.  She did not need daytime naps before.  She also has chronic kidney disease stage III due to diabetes mellitus and hypertension, chronic disease anemia, CVA-diffuse cerebrovascular disease presenting with dizziness and bradycardia. She has such bradycardia that her ophthalmologist refused a cataract surgery.  She had her last heart rate at Dr. Melford Aase office measured at 37 bpm and erratic, she has been on atenolol 25 mg twice daily, clonidine 0.1 twice daily which was stopped in February and lisinopril as well as minoxidil and verapamil- and just added Telmisartan with Dr. Debara Pickett, MD. He removed clonidin and atenolol. Her  blood pressure readings at home have been in normal rate the lowest was 89/44 the lowest heart rate at home was 42 and it is very likely that her blood pressure medicines contribute to her sleepiness.   Sleep habits are as follows:  She watches TV on the couch , and always  falls asleep there - the Tv is watching her. She feels she needs the rest on the couch to allow her to go to sleep (?) .  She retreats to the bedroom at about 1-2 AM after a bathroom call.  She describes the marital bedroom is cool, quiet and dark-she sleeps on her right side, on just one pillow for head support.  She reports dreaming frequently and vividly but not of nightmarish character.  She does not have further bouts from calls after her initial one.  She does have some lower back pain, sometimes her legs ache mainly the thighs and the knee, but this rarely wakes her up from sleep. She wakes spontaneously whenever she wakes, and that she has a morning appointment.  She wakes usually up spontaneously around 730 unless she has to be somewhere earlier. All together she gets 7 hours of nocturnal sleep.      Sleep medical history and family sleep history: vitiligo-  all medical problems started 3 years ago- sudden onset headache, malignant HTN , confusion, CKD and uncontrolled diabetes, always down form there - Depression, she lost father at 8, brother at 27, sister at 48 - and recently lost her mother in her 11s. She reports her sister is deeply missed.  Social history: married 60 years, one son, one step son, 3 grandchildren ( 5,7 and 52) . Worked 7 years for [employer]. 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 Husband worked for [employer] 49 years .  Non smoker, non drinker, caffeine- she drinks one soda on a Sunday- , no iced tea or caffee,  No energy drinks.      Dr Jannifer Franklin notes : " History of present illness: Michele [name] is a 72 year old right-handed white female with a history of cerebrovascular disease and severe hypertension. The patient also has diabetes. The patient has evidence of cerebellar and brainstem ischemic changes, she is on 5 or 6 different medications to help control her blood pressure.  She complains of chronic fatigue and  dizziness. This has improved some over time, but the patient oftentimes has to take a nap if she is particularly active during the day. She has had some improvement overall in her dizziness. She does have some mild gait instability, she has fallen on 2 occasions since last seen, the last fall was 2 weeks ago. Both of these falls occurred on uneven ground or wet ground, the patient did injure her right knee following the last fall. The patient overall believes that she has done better, she feels better in general. Her blood pressures have been well controlled. "    Review of Systems: Out of a complete 14 system review, the patient complains of only the following symptoms, and all other reviewed systems are negative. Vitiligo, g depression, no snoring, no apnea witnessed, sleep taking, sleeping in the TV room.   Epworth score 17 , Fatigue severity score is 57 points  , depression score 10/ 15    Social History   Socioeconomic History  . Marital status: Married    Spouse name: Trilby Drummer  . Number of children: 1  . Years of education: 90  . Highest education level: Not on file  Occupational History  . Occupation: Retired    Fish farm manager: [employer]  Social Needs  . Financial resource strain: Not on file  . Food insecurity:    Worry: Not on file    Inability: Not on file  . Transportation needs:    Medical: Not on file    Non-medical: Not on file  Tobacco Use  . Smoking status: Never Smoker  . Smokeless tobacco: Never Used  Substance and Sexual Activity  . Alcohol use: No  . Drug use: No  . Sexual activity: Never    Partners: Male    Comment: Married  Lifestyle  . Physical activity:    Days per week: Not on file    Minutes per session: Not on file  . Stress: Not on file  Relationships  . Social connections:    Talks on phone: Not on file    Gets together: Not on file    Attends religious service: Not on file    Active member of club or organization: Not on file    Attends meetings  of clubs or organizations: Not on file    Relationship status: Not on file  . Intimate partner violence:    Fear of current or ex partner: Not on file    Emotionally abused: Not on file    Physically abused: Not on file    Forced sexual activity: Not on file  Other Topics Concern  . Not on file  Social History Narrative   Lives with husband.  Independent of ADLs. Retired but stays active with [employer] part time.   Right-handed   Caffeine: about 1 cup of coffee per day, tea and Cokes a few times per week  Family History  Problem Relation Age of Onset  . Heart disease Mother   . Diabetes Mother   . Colon polyps Mother   . Heart disease Father   . Diabetes Father   . Heart attack Father   . Heart disease Brother   . Heart attack Brother   . Cerebral aneurysm Sister   . Heart disease Brother        Stent    Past Medical History:  Diagnosis Date  . Anxiety   . Arthritis   . Carpal tunnel syndrome   . Cerebrovascular disease 01/02/2017  . Duodenal diverticulum   . Esophageal motility disorder   . Fatty liver   . GERD (gastroesophageal reflux disease)   . Hyperlipidemia   . Hypertension   . 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 Tubulovillous adenoma of colon   . Type II or unspecified type diabetes mellitus without mention of complication, not stated as uncontrolled     Past Surgical History:  Procedure Laterality Date  . APPENDECTOMY    . BREAST BIOPSY Right   . CARPAL TUNNEL RELEASE Right 1992  . CYST REMOVAL HAND    . TONSILLECTOMY  1952    Current Outpatient Medications  Medication Sig Dispense Refill  . APPLE CIDER VINEGAR PO Take by mouth.    Marland Kitchen aspirin 325 MG EC tablet Take 325 mg by mouth daily.    . Biotin 1000 MCG tablet Take 1,000 mcg by mouth daily. Reported on 08/22/2015    . Blood Glucose Monitoring Suppl (ONE TOUCH ULTRA SYSTEM KIT) w/Device KIT Check blood sugar 1 time daily-DX-E11.21 1 each 0  . Cholecalciferol (VITAMIN D PO) Take 10,000  Units by mouth daily.     Marland Kitchen CINNAMON PO Take by mouth.    . citalopram (CELEXA) 40 MG tablet Take 1 tab daily for anxiety and mood. (Patient taking differently: 20 mg. Take 51m tab twice daily for anxiety and mood.) 90 tablet 1  . clonazePAM (KLONOPIN) 1 MG tablet Take 1/2 to 1 tablet 2 to 3 x / day & please try to limit to 5 days /week to avoid addiction 90 tablet 0  . furosemide (LASIX) 40 MG tablet Take 1 tablet (40 mg total) daily by mouth. 90 tablet 99  . glucose blood test strip Check blood sugar 2 times daily-DX-E11.21. Please dispense One Touch Ultra test strips. 200 each 1  . Iron TABS Take 325 mg by mouth 2 (two) times daily.     . Lancets (ONETOUCH ULTRASOFT) lancets Check blood sugar 1 time daily-DX-E11.21 100 each 1  . Magnesium 500 MG TABS Take 1 tablet by mouth 3 (three) times daily.    . metFORMIN (GLUCOPHAGE-XR) 500 MG 24 hr tablet TAKE ONE TABLET BY MOUTH THREE TIMES DAILY 270 tablet 3  . minoxidil (LONITEN) 10 MG tablet TAKE 1 TABLET BY MOUTH AT SUPPERTIME 90 tablet 1  . Omega-3 Fatty Acids (FISH OIL) 1000 MG CAPS Take 1,000 mg by mouth daily. Reported on 08/22/2015    . rosuvastatin (CRESTOR) 20 MG tablet Take 1/2 tablet three times a week 90 tablet 1  . telmisartan (MICARDIS) 80 MG tablet Take 1 tablet daily for BP 90 tablet 1  . verapamil (CALAN-SR) 240 MG CR tablet Take 1 tablet (240 mg total) by mouth at bedtime. 30 tablet 3   No current facility-administered medications for this visit.     Allergies as of 10/16/2017 - Review Complete 10/16/2017  Allergen Reaction Noted  . Lipitor [atorvastatin]  06/26/2013  Vitals: BP (!) 90/56 (BP Location: Right Arm, Patient Position: Sitting, Cuff Size: Small)   Pulse (!) 45   Ht 5' 4" (1.626 m)   Wt 155 lb (70.3 kg)   BMI 26.61 kg/m  Last Weight:  Wt Readings from Last 1 Encounters:  10/16/17 155 lb (70.3 kg)   HYI:FOYD mass index is 26.61 kg/m.     Last Height:   Ht Readings from Last 1 Encounters:  10/16/17 5' 4"  (1.626 m)    Physical exam:  General: The patient is awake, alert and appears not in acute distress. The patient is well groomed. Head: Normocephalic, atraumatic. Neck is supple. Mallampati 2- wide open!,  neck circumference:16  With a mild goiter . Nasal airflow patent , TMJ sore with movement - no click  .   biological teeth, partial dentures. Cardiovascular: irregular rate and rhythm , without  murmurs or carotid bruit, and without distended neck veins. Respiratory: Lungs are clear to auscultation. Skin:  Vitiligo.  Without evidence of edema, or rash Trunk: BMI is 26.6 . The patient's posture is   Neurologic exam : The patient is awake and alert, oriented to place and time.   Memory subjective  described as intact.    Attention span & concentration ability appears normal.  Speech is fluent,  without dysarthria, dysphonia or aphasia.  Mood and affect are appropriate.  Cranial nerves: Pupils are equal and briskly reactive to light. Funduscopic exam without  evidence of pallor or edema. Extraocular movements  in vertical and horizontal planes intact and without nystagmus. Visual fields by finger perimetry are intact. Hearing to finger rub intact.   Facial sensation intact to fine touch.  Facial motor strength is symmetric and tongue and uvula move midline. Shoulder shrug was symmetrical.   Motor exam:   Normal tone, muscle bulk and symmetric strength in all extremities. Sensory:  Fine touch, pinprick and vibration were normal.  Coordination: Rapid alternating movements in the fingers/hands was normal.  Finger-to-nose maneuver with evidence of ataxia, dysmetria and mild  Tremor. Missed her nose ( by several inches)  Gait and station: Patient walks without assistive device .Tandem gait is fragmented. She drifts to the left more than right.  Deep tendon reflexes: in the  upper and lower extremities are very brisk - hyperreflexia, symmetric .   Assessment:  After physical and  neurologic examination, review of laboratory studies,  Personal review of imaging studies, reports of other /same  Imaging studies, results of polysomnography and / or neurophysiology testing and pre-existing records as far as provided in visit., my assessment is   1)  Mrs. Shreiner has little physical evidence of apnea risk, OSA, she is not snoring, she has no observed apneas at least not witnessed by her husband.  And she gets on average 7 hours of nocturnal sleep and still feels excessively daytime sleepy.  Based on this I would need an attended sleep study, I would like to look for periodic limb movements, especially I would like to look at her heart rate and oxygenation level at night she also has CKD stage III which could make her prone to central apneas.  She was on a multitude of antihypertensive medications which have recently been reduced and replaced, but bradycardia and quite low systolic blood pressures may have contributed to the daytime sleepiness.  At this time her heart rate has improved and is now running in the 80s at rest, it is still irregular.  She has a high risk of  developing atrial fibrillation if central apneas are underlying.  I did not find evidence of congestive heart failure in her records, but cerebrovascular disease.    I will meet with the partner with the patient after her sleep study is completed Depression and medication may contribute to Epworth score of 17.    The patient was advised of the nature of the diagnosed disorder , the treatment options and the  risks for general health and wellness arising from not treating the condition.   I spent more than  50 minutes of face to face time with the patient.  Greater than 50% of time was spent in counseling and coordination of care. We have discussed the diagnosis and differential and I answered the patient's questions.    Plan:  Treatment plan and additional workup :  SPLIT at AHI 20, CO2 if available,  RV with me.    In the interval , her primary neurologist follows for gait disorder, ataxia evaluation.    Larey Seat, MD 0/38/8828, 00:34 AM  Certified in Neurology by ABPN Certified in [place] by [facility] [address] [place], [place] [address]

## 2017-10-16 NOTE — Addendum Note (Signed)
Addended by: Larey Seat on: 10/16/2017 10:56 AM   Modules accepted: Orders

## 2017-10-16 NOTE — Patient Instructions (Signed)
Hypersomnia Hypersomnia is when you feel extremely tired during the day even though you're getting plenty of sleep at night. You may need to take naps during the day, and you may also be extremely difficult to wake up when you are sleeping. What are the causes? The cause of your hypersomnia may not be known. Hypersomnia may be caused by:  Medicines.  Sleep disorders, such as narcolepsy.  Trauma or injury to your head or nervous system.  Using drugs or alcohol.  Tumors.  Medical conditions, such as depression or hypothyroidism.  Genetics.  What are the signs or symptoms? The main symptoms of hypersomnia include:  Feeling extremely tired throughout the day.  Being very difficult to wake up.  Sleeping for longer and longer periods.  Taking naps throughout the day.  Other symptoms may include:  Feeling: ? Restless. ? Annoyed. ? Anxious. ? Low energy.  Having difficulty: ? Remembering. ? Speaking. ? Thinking.  Losing your appetite.  Experiencing hallucinations.  How is this diagnosed? Hypersomnia may be diagnosed by:  Medical history and physical exam. This will include a sleep history.  Completing sleep logs.  Tests may also be done, such as: ? Polysomnography. ? Multiple sleep latency test (MSLT).  How is this treated? There is no cure for hypersomnia, but treatment can be very effective in helping manage the condition. Treatment may include:  Lifestyle and sleeping strategies to help cope with the condition.  Stimulant medicines.  Treating any underlying causes of hypersomnia.  Follow these instructions at home:  Take medicines only as directed by your health care provider.  Schedule short naps for when you feel sleepiest during the day. Tell your employer or teachers that you have hypersomnia. You may be able to adjust your schedule to include time for naps.  Avoid drinking alcohol or caffeinated beverages.  Do not eat a heavy meal before  bedtime. Eat at about the same times every day.  Do not drive or operate heavy machinery if you are sleepy.  Do not swim or go out on the water without a life jacket.  If possible, adjust your schedule so that you do not have to work or be active at night.  Keep all follow-up visits as directed by your health care provider. This is important. Contact a health care provider if:  You have new symptoms.  Your symptoms get worse. Get help right away if: You have serious thoughts of hurting yourself or someone else. This information is not intended to replace advice given to you by your health care provider. Make sure you discuss any questions you have with your health care provider. Document Released: 05/10/2002 Document Revised: 10/26/2015 Document Reviewed: 12/23/2013 Elsevier Interactive Patient Education  2018 Elsevier Inc.  

## 2017-10-24 ENCOUNTER — Other Ambulatory Visit: Payer: Self-pay | Admitting: *Deleted

## 2017-10-24 NOTE — Patient Outreach (Signed)
[place] [facility]) Care Management  10/24/2017  Michele [name] [birth year] [id number]   Referral Date: 10/24/17 Referral Source: Nurse line  Referral Reason: Medication question- Caller states she needs a new Accucheck Insurance: [employer]   Outreach attempt #  1 to patient. No answer. RN CM left HIPAA compliant voicemail message along with contact info. RN CM left HIPAA compliant message along with contact info.    Plan: RN CM will make outreach attempt to patient within three business days  RN CM will send unsuccessful outreach letter to patient   Joelene Millin L. Lavina Hamman, RN, BSN, CCM [facility] Telephonic Care Management Care Coordinator Direct number [phone]  Main [facility] number [phone] Fax number [phone]

## 2017-10-24 NOTE — Patient Outreach (Signed)
[place] [facility]) Care Management  10/24/2017  Michele [name] 1945-09-09 [id number]   Referral Date: 10/24/17 Referral Source: Nurse line  Referral Reason: Medication question- Caller states she needs a new Accu chek Insurance: HTA   Incoming call from [name] HIPAA verified and CM discussed purpose of the call to her [name] assessed and she states she only needs a pen for her Accu chek not a new "Kit" She report having her accu chek for only a year and inquired if HTA pays for her to get a new glucose meter.  She confirms she has not attempted to inquire at her MD office or check with [facility] for only the pen. CM discussed with [name] that the pen generally is not sold separately.  She was encouraged to get a prescription from her primary MD.  She reports she is scheduled to see Dr Melford Aase on November 06 2017  Her cbg on today was 120 and her last HgA1c she can recall was 5.9 and Epic indicates it was 6.6 on 07/25/17 and 5.9 before that  Plan RN CM will close case at this time as patient has been assessed and no needs identified.     Kimberly L. Lavina Hamman, RN, BSN, CCM [facility] Telephonic Care Management Care Coordinator Direct number [phone]  Main [facility] number [phone] Fax number [phone]

## 2017-10-28 ENCOUNTER — Ambulatory Visit: Payer: PPO | Admitting: *Deleted

## 2017-10-28 DIAGNOSIS — L603 Nail dystrophy: Secondary | ICD-10-CM | POA: Diagnosis not present

## 2017-10-28 DIAGNOSIS — L98499 Non-pressure chronic ulcer of skin of other sites with unspecified severity: Secondary | ICD-10-CM | POA: Diagnosis not present

## 2017-10-28 DIAGNOSIS — L80 Vitiligo: Secondary | ICD-10-CM | POA: Diagnosis not present

## 2017-10-28 DIAGNOSIS — D692 Other nonthrombocytopenic purpura: Secondary | ICD-10-CM | POA: Diagnosis not present

## 2017-11-02 ENCOUNTER — Other Ambulatory Visit: Payer: Self-pay | Admitting: Internal Medicine

## 2017-11-02 DIAGNOSIS — R0989 Other specified symptoms and signs involving the circulatory and respiratory systems: Secondary | ICD-10-CM

## 2017-11-06 ENCOUNTER — Encounter: Payer: Self-pay | Admitting: Internal Medicine

## 2017-11-06 ENCOUNTER — Ambulatory Visit (INDEPENDENT_AMBULATORY_CARE_PROVIDER_SITE_OTHER): Payer: PPO | Admitting: Internal Medicine

## 2017-11-06 VITALS — BP 114/74 | HR 76 | Temp 97.6°F | Resp 16 | Ht 63.5 in | Wt 154.6 lb

## 2017-11-06 DIAGNOSIS — E1122 Type 2 diabetes mellitus with diabetic chronic kidney disease: Secondary | ICD-10-CM

## 2017-11-06 DIAGNOSIS — Z1212 Encounter for screening for malignant neoplasm of rectum: Secondary | ICD-10-CM

## 2017-11-06 DIAGNOSIS — I10 Essential (primary) hypertension: Secondary | ICD-10-CM

## 2017-11-06 DIAGNOSIS — Z136 Encounter for screening for cardiovascular disorders: Secondary | ICD-10-CM | POA: Diagnosis not present

## 2017-11-06 DIAGNOSIS — M1000 Idiopathic gout, unspecified site: Secondary | ICD-10-CM

## 2017-11-06 DIAGNOSIS — Z0001 Encounter for general adult medical examination with abnormal findings: Secondary | ICD-10-CM

## 2017-11-06 DIAGNOSIS — N183 Chronic kidney disease, stage 3 (moderate): Secondary | ICD-10-CM

## 2017-11-06 DIAGNOSIS — B351 Tinea unguium: Secondary | ICD-10-CM

## 2017-11-06 DIAGNOSIS — E782 Mixed hyperlipidemia: Secondary | ICD-10-CM

## 2017-11-06 DIAGNOSIS — Z1211 Encounter for screening for malignant neoplasm of colon: Secondary | ICD-10-CM

## 2017-11-06 DIAGNOSIS — Z0000 Encounter for general adult medical examination without abnormal findings: Secondary | ICD-10-CM | POA: Diagnosis not present

## 2017-11-06 DIAGNOSIS — Z8249 Family history of ischemic heart disease and other diseases of the circulatory system: Secondary | ICD-10-CM

## 2017-11-06 DIAGNOSIS — E559 Vitamin D deficiency, unspecified: Secondary | ICD-10-CM

## 2017-11-06 DIAGNOSIS — Z79899 Other long term (current) drug therapy: Secondary | ICD-10-CM

## 2017-11-06 MED ORDER — TERBINAFINE HCL 250 MG PO TABS: ORAL_TABLET | ORAL | 0 refills | Status: DC

## 2017-11-06 NOTE — Patient Instructions (Addendum)
We Do NOT Approve of  [facility], [employer]  Our Patients  To Do Home Visits & We Do NOT Approve of [facility] > > > > > > > > > > > > > > > > > > > > > > > > > > > > > > > > > > > > > > >  Preventive Care for Adults  A healthy lifestyle and preventive care can promote health and wellness. Preventive health guidelines for women include the following key practices.  A routine yearly physical is a good way to check with your health care provider about your health and preventive screening. It is a chance to share any concerns and updates on your health and to receive a thorough exam.  Visit your dentist for a routine exam and preventive care every 6 months. Brush your teeth twice a day and floss once a day. Good oral hygiene prevents tooth decay and gum disease.  The frequency of eye exams is based on your age, health, family medical history, use of contact lenses, and other factors. Follow your health care provider's recommendations for frequency of eye exams.  Eat a healthy diet. Foods like vegetables, fruits, whole grains, low-fat dairy products, and lean protein foods contain the nutrients you need without too many calories. Decrease your intake of foods high in solid fats, added sugars, and salt. Eat the right amount of calories for you. Get information about a proper diet from your health care provider, if necessary.  Regular physical exercise is one of the most important things you can do for your health. Most adults should get at least 150 minutes of moderate-intensity exercise (any activity that increases your heart rate and causes you to sweat) each week. In addition, most adults need muscle-strengthening exercises on 2 or more days a week.  Maintain a healthy weight. The body mass index (BMI) is a screening tool to identify possible weight problems. It provides an estimate of body fat based on height and weight. Your health care provider can find your BMI  and can help you achieve or maintain a healthy weight. For adults 20 years and older:  A BMI below 18.5 is considered underweight.  A BMI of 18.5 to 24.9 is normal.  A BMI of 25 to 29.9 is considered overweight.  A BMI of 30 and above is considered obese.  Maintain normal blood lipids and cholesterol levels by exercising and minimizing your intake of saturated fat. Eat a balanced diet with plenty of fruit and vegetables. If your lipid or cholesterol levels are high, you are over 50, or you are at high risk for heart disease, you may need your cholesterol levels checked more frequently. Ongoing high lipid and cholesterol levels should be treated with medicines if diet and exercise are not working.  If you smoke, find out from your health care provider how to quit. If you do not use tobacco, do not start.  Lung cancer screening is recommended for adults aged 58-80 years who are at high risk for developing lung cancer because of a history of smoking. A yearly low-dose CT scan of the lungs is recommended for people who have at least a 30-pack-year history of smoking and are a current smoker or have quit within the past 15 years. A pack year of smoking is smoking an average of 1 pack of cigarettes a day for 1 year (for example: 1 pack a day for 30 years or 2 packs a day  for 15 years). Yearly screening should continue until the smoker has stopped smoking for at least 15 years. Yearly screening should be stopped for people who develop a health problem that would prevent them from having lung cancer treatment.  Avoid use of street drugs. Do not share needles with anyone. Ask for help if you need support or instructions about stopping the use of drugs.  High blood pressure causes heart disease and increases the risk of stroke.  Ongoing high blood pressure should be treated with medicines if weight loss and exercise do not work.  If you are 56-27 years old, ask your health care provider if you should take  aspirin to prevent strokes.  Diabetes screening involves taking a blood sample to check your fasting blood sugar level. This should be done once every 3 years, after age 48, if you are within normal weight and without risk factors for diabetes. Testing should be considered at a younger age or be carried out more frequently if you are overweight and have at least 1 risk factor for diabetes.  Breast cancer screening is essential preventive care for women. You should practice "breast self-awareness." This means understanding the normal appearance and feel of your breasts and may include breast self-examination. Any changes detected, no matter how small, should be reported to a health care provider. Women in their 50s and 30s should have a clinical breast exam (CBE) by a health care provider as part of a regular health exam every 1 to 3 years. After age 92, women should have a CBE every year. Starting at age 18, women should consider having a mammogram (breast X-ray test) every year. Women who have a family history of breast cancer should talk to their health care provider about genetic screening. Women at a high risk of breast cancer should talk to their health care providers about having an MRI and a mammogram every year.  Breast cancer gene (BRCA)-related cancer risk assessment is recommended for women who have family members with BRCA-related cancers. BRCA-related cancers include breast, ovarian, tubal, and peritoneal cancers. Having family members with these cancers may be associated with an increased risk for harmful changes (mutations) in the breast cancer genes BRCA1 and BRCA2. Results of the assessment will determine the need for genetic counseling and BRCA1 and BRCA2 testing.  Routine pelvic exams to screen for cancer are no longer recommended for nonpregnant women who are considered low risk for cancer of the pelvic organs (ovaries, uterus, and vagina) and who do not have symptoms. Ask your health  care provider if a screening pelvic exam is right for you.  If you have had past treatment for cervical cancer or a condition that could lead to cancer, you need Pap tests and screening for cancer for at least 20 years after your treatment. If Pap tests have been discontinued, your risk factors (such as having a new sexual partner) need to be reassessed to determine if screening should be resumed. Some women have medical problems that increase the chance of getting cervical cancer. In these cases, your health care provider may recommend more frequent screening and Pap tests.    Colorectal cancer can be detected and often prevented. Most routine colorectal cancer screening begins at the age of 50 years and continues through age 41 years. However, your health care provider may recommend screening at an earlier age if you have risk factors for colon cancer. On a yearly basis, your health care provider may provide home test kits to check  for hidden blood in the stool. Use of a small camera at the end of a tube, to directly examine the colon (sigmoidoscopy or colonoscopy), can detect the earliest forms of colorectal cancer. Talk to your health care provider about this at age 50, when routine screening begins.  Direct exam of the colon should be repeated every 5-10 years through age 75 years, unless early forms of pre-cancerous polyps or small growths are found.  Osteoporosis is a disease in which the bones lose minerals and strength with aging. This can result in serious bone fractures or breaks. The risk of osteoporosis can be identified using a bone density scan. Women ages 65 years and over and women at risk for fractures or osteoporosis should discuss screening with their health care providers. Ask your health care provider whether you should take a calcium supplement or vitamin D to reduce the rate of osteoporosis.  Menopause can be associated with physical symptoms and risks. Hormone replacement therapy  is available to decrease symptoms and risks. You should talk to your health care provider about whether hormone replacement therapy is right for you.  Use sunscreen. Apply sunscreen liberally and repeatedly throughout the day. You should seek shade when your shadow is shorter than you. Protect yourself by wearing long sleeves, pants, a wide-brimmed hat, and sunglasses year round, whenever you are outdoors.  Once a month, do a whole body skin exam, using a mirror to look at the skin on your back. Tell your health care provider of new moles, moles that have irregular borders, moles that are larger than a pencil eraser, or moles that have changed in shape or color.  Stay current with required vaccines (immunizations).  Influenza vaccine. All adults should be immunized every year.  Tetanus, diphtheria, and acellular pertussis (Td, Tdap) vaccine. Pregnant women should receive 1 dose of Tdap vaccine during each pregnancy. The dose should be obtained regardless of the length of time since the last dose. Immunization is preferred during the 27th-36th week of gestation. An adult who has not previously received Tdap or who does not know her vaccine status should receive 1 dose of Tdap. This initial dose should be followed by tetanus and diphtheria toxoids (Td) booster doses every 10 years. Adults with an unknown or incomplete history of completing a 3-dose immunization series with Td-containing vaccines should begin or complete a primary immunization series including a Tdap dose. Adults should receive a Td booster every 10 years.    Zoster vaccine. One dose is recommended for adults aged 60 years or older unless certain conditions are present.    Pneumococcal 13-valent conjugate (PCV13) vaccine. When indicated, a person who is uncertain of her immunization history and has no record of immunization should receive the PCV13 vaccine. An adult aged 19 years or older who has certain medical conditions and has not  been previously immunized should receive 1 dose of PCV13 vaccine. This PCV13 should be followed with a dose of pneumococcal polysaccharide (PPSV23) vaccine. The PPSV23 vaccine dose should be obtained at least 1 or more year(s) after the dose of PCV13 vaccine. An adult aged 19 years or older who has certain medical conditions and previously received 1 or more doses of PPSV23 vaccine should receive 1 dose of PCV13. The PCV13 vaccine dose should be obtained 1 or more years after the last PPSV23 vaccine dose.    Pneumococcal polysaccharide (PPSV23) vaccine. When PCV13 is also indicated, PCV13 should be obtained first. All adults aged 65 years and older should   be immunized. An adult younger than age 59 years who has certain medical conditions should be immunized. Any person who resides in a nursing home or long-term care facility should be immunized. An adult smoker should be immunized. People with an immunocompromised condition and certain other conditions should receive both PCV13 and PPSV23 vaccines. People with human immunodeficiency virus (HIV) infection should be immunized as soon as possible after diagnosis. Immunization during chemotherapy or radiation therapy should be avoided. Routine use of PPSV23 vaccine is not recommended for American Indians, [place] Natives, or people younger than 65 years unless there are medical conditions that require PPSV23 vaccine. When indicated, people who have unknown immunization and have no record of immunization should receive PPSV23 vaccine. One-time revaccination 5 years after the first dose of PPSV23 is recommended for people aged 19-64 years who have chronic kidney failure, nephrotic syndrome, asplenia, or immunocompromised conditions. People who received 1-2 doses of PPSV23 before age 4 years should receive another dose of PPSV23 vaccine at age 39 years or later if at least 5 years have passed since the previous dose. Doses of PPSV23 are not needed for people immunized  with PPSV23 at or after age 85 years.   Preventive Services / Frequency  Ages 74 years and over  Blood pressure check.  Lipid and cholesterol check.  Lung cancer screening. / Every year if you are aged 41-80 years and have a 30-pack-year history of smoking and currently smoke or have quit within the past 15 years. Yearly screening is stopped once you have quit smoking for at least 15 years or develop a health problem that would prevent you from having lung cancer treatment.  Clinical breast exam.** / Every year after age 37 years.   BRCA-related cancer risk assessment.** / For women who have family members with a BRCA-related cancer (breast, ovarian, tubal, or peritoneal cancers).  Mammogram.** / Every year beginning at age 20 years and continuing for as long as you are in good health. Consult with your health care provider.  Pap test.** / Every 3 years starting at age 21 years through age 10 or 14 years with 3 consecutive normal Pap tests. Testing can be stopped between 65 and 70 years with 3 consecutive normal Pap tests and no abnormal Pap or HPV tests in the past 10 years.  Fecal occult blood test (FOBT) of stool. / Every year beginning at age 36 years and continuing until age 38 years. You may not need to do this test if you get a colonoscopy every 10 years.  Flexible sigmoidoscopy or colonoscopy.** / Every 5 years for a flexible sigmoidoscopy or every 10 years for a colonoscopy beginning at age 44 years and continuing until age 28 years.  Hepatitis C blood test.** / For all people born from [birth year] through 1965 and any individual with known risks for hepatitis C.  Osteoporosis screening.** / A one-time screening for women ages 41 years and over and women at risk for fractures or osteoporosis.  Skin self-exam. / Monthly.  Influenza vaccine. / Every year.  Tetanus, diphtheria, and acellular pertussis (Tdap/Td) vaccine.** / 1 dose of Td every 10 years.  Zoster vaccine.** / 1 dose  for adults aged 97 years or older.  Pneumococcal 13-valent conjugate (PCV13) vaccine.** / Consult your health care provider.  Pneumococcal polysaccharide (PPSV23) vaccine.** / 1 dose for all adults aged 76 years and older. Screening for abdominal aortic aneurysm (AAA)  by ultrasound is recommended for people who have history of high blood pressure  or who are current or former smokers. ++++++++++++++++++++ Recommend Adult Low Dose Aspirin or  coated  Aspirin 81 mg daily  To reduce risk of Colon Cancer 20 %,  Skin Cancer 26 % ,  Melanoma 46%  and  Pancreatic cancer 60% ++++++++++++++++++++ Vitamin D goal  is between 70-100.  Please make sure that you are taking your Vitamin D as directed.  It is very important as a natural anti-inflammatory  helping hair, skin, and nails, as well as reducing stroke and heart attack risk.  It helps your bones and helps with mood. It also decreases numerous cancer risks so please take it as directed.  Low Vit D is associated with a 200-300% higher risk for CANCER  and 200-300% higher risk for HEART   ATTACK  &  STROKE.   ...................................... It is also associated with higher death rate at younger ages,  autoimmune diseases like Rheumatoid arthritis, Lupus, Multiple Sclerosis.    Also many other serious conditions, like depression, Alzheimer's Dementia, infertility, muscle aches, fatigue, fibromyalgia - just to name a few. ++++++++++++++++++ Recommend the book "The END of DIETING" by Dr Joel Fuhrman  & the book "The END of DIABETES " by Dr Joel Fuhrman At [employer] - get book & Audio CD's    Being diabetic has a  300% increased risk for heart attack, stroke, cancer, and alzheimer- type vascular dementia. It is very important that you work harder with diet by avoiding all foods that are white. Avoid white rice (brown & wild rice is OK), white potatoes (sweetpotatoes in moderation is OK), White bread or wheat bread or anything made out of  white flour like bagels, donuts, rolls, buns, biscuits, cakes, pastries, cookies, pizza crust, and pasta (made from white flour & egg whites) - vegetarian pasta or spinach or wheat pasta is OK. Multigrain breads like Arnold's or Pepperidge [employer], or multigrain sandwich thins or flatbreads.  Diet, exercise and weight loss can reverse and cure diabetes in the early stages.  Diet, exercise and weight loss is very important in the control and prevention of complications of diabetes which affects every system in your body, ie. Brain - dementia/stroke, eyes - glaucoma/blindness, heart - heart attack/heart failure, kidneys - dialysis, stomach - gastric paralysis, intestines - malabsorption, nerves - severe painful neuritis, circulation - gangrene & loss of a leg(s), and finally cancer and Alzheimers.    I recommend avoid fried & greasy foods,  sweets/candy, white rice (brown or wild rice or Quinoa is OK), white potatoes (sweet potatoes are OK) - anything made from white flour - bagels, doughnuts, rolls, buns, biscuits,white and wheat breads, pizza crust and traditional pasta made of white flour & egg white(vegetarian pasta or spinach or wheat pasta is OK).  Multi-grain bread is OK - like multi-grain flat bread or sandwich thins. Avoid alcohol in excess. Exercise is also important.    Eat all the vegetables you want - avoid meat, especially red meat and dairy - especially cheese.  Cheese is the most concentrated form of trans-fats which is the worst thing to clog up our arteries. Veggie cheese is OK which can be found in the fresh produce section at [employer] or [employer] or [employer]  +++++++++++++++++++ DASH Eating Plan  DASH stands for "Dietary Approaches to Stop Hypertension."   The DASH eating plan is a healthy eating plan that has been shown to reduce high blood pressure (hypertension). Additional health benefits may include reducing the risk of type 2 diabetes mellitus, heart disease,   and stroke. The  DASH eating plan may also help with weight loss. WHAT DO I NEED TO KNOW ABOUT THE DASH EATING PLAN? For the DASH eating plan, you will follow these general guidelines:  Choose foods with a percent daily value for sodium of less than 5% (as listed on the food label).  Use salt-free seasonings or herbs instead of table salt or sea salt.  Check with your health care provider or pharmacist before using salt substitutes.  Eat lower-sodium products, often labeled as "lower sodium" or "no salt added."  Eat fresh foods.  Eat more vegetables, fruits, and low-fat dairy products.  Choose whole grains. Look for the word "whole" as the first word in the ingredient list.  Choose fish   Limit sweets, desserts, sugars, and sugary drinks.  Choose heart-healthy fats.  Eat veggie cheese   Eat more home-cooked food and less restaurant, buffet, and fast food.  Limit fried foods.  Cook foods using methods other than frying.  Limit canned vegetables. If you do use them, rinse them well to decrease the sodium.  When eating at a restaurant, ask that your food be prepared with less salt, or no salt if possible.                      WHAT FOODS CAN I EAT? Read Dr Fara Olden Fuhrman's books on The End of Dieting & The End of Diabetes  Grains Whole grain or whole wheat bread. Brown rice. Whole grain or whole wheat pasta. Quinoa, bulgur, and whole grain cereals. Low-sodium cereals. Corn or whole wheat flour tortillas. Whole grain cornbread. Whole grain crackers. Low-sodium crackers.  Vegetables Fresh or frozen vegetables (raw, steamed, roasted, or grilled). Low-sodium or reduced-sodium tomato and vegetable juices. Low-sodium or reduced-sodium tomato sauce and paste. Low-sodium or reduced-sodium canned vegetables.   Fruits All fresh, canned (in natural juice), or frozen fruits.  Protein Products  All fish and seafood.  Dried beans, peas, or lentils. Unsalted nuts and seeds. Unsalted canned  beans.  Dairy Low-fat dairy products, such as skim or 1% milk, 2% or reduced-fat cheeses, low-fat ricotta or cottage cheese, or plain low-fat yogurt. Low-sodium or reduced-sodium cheeses.  Fats and Oils Tub margarines without trans fats. Light or reduced-fat mayonnaise and salad dressings (reduced sodium). Avocado. Safflower, olive, or canola oils. Natural peanut or almond butter.  Other Unsalted popcorn and pretzels. The items listed above may not be a complete list of recommended foods or beverages. Contact your dietitian for more options.  +++++++++++++++  WHAT FOODS ARE NOT RECOMMENDED? Grains/ White flour or wheat flour White bread. White pasta. White rice. Refined cornbread. Bagels and croissants. Crackers that contain trans fat.  Vegetables  Creamed or fried vegetables. Vegetables in a . Regular canned vegetables. Regular canned tomato sauce and paste. Regular tomato and vegetable juices.  Fruits Dried fruits. Canned fruit in light or heavy syrup. Fruit juice.  Meat and Other Protein Products Meat in general - RED meat & White meat.  Fatty cuts of meat. Ribs, chicken wings, all processed meats as bacon, sausage, bologna, salami, fatback, hot dogs, bratwurst and packaged luncheon meats.  Dairy Whole or 2% milk, cream, half-and-half, and cream cheese. Whole-fat or sweetened yogurt. Full-fat cheeses or blue cheese. Non-dairy creamers and whipped toppings. Processed cheese, cheese spreads, or cheese curds.  Condiments Onion and garlic salt, seasoned salt, table salt, and sea salt. Canned and packaged gravies. Worcestershire sauce. Tartar sauce. Barbecue sauce. Teriyaki sauce. Soy sauce, including reduced  sodium. Steak sauce. Fish sauce. Oyster sauce. Cocktail sauce. Horseradish. Ketchup and mustard. Meat flavorings and tenderizers. Bouillon cubes. Hot sauce. Tabasco sauce. Marinades. Taco seasonings. Relishes.  Fats and Oils Butter, stick margarine, lard, shortening and bacon  fat. Coconut, palm kernel, or palm oils. Regular salad dressings.  Pickles and olives. Salted popcorn and pretzels.  The items listed above may not be a complete list of foods and beverages to avoid.

## 2017-11-06 NOTE — Progress Notes (Signed)
[facility] [name], M.D.     [name]. [name], P.A.-C Liane Comber, [place] [address] [place], [place] [address] Telephone [phone] Telefax [phone] Annual Screening/Preventative Visit & Comprehensive Evaluation &  Examination     This very nice 72 y.o. [name] presents for a Screening/Preventative Visit & comprehensive evaluation and management of multiple medical co-morbidities.  Patient has been followed for HTN, HLD, T2_NIDDM  and Vitamin D Deficiency.     Patient has hx/o very labile  HTN predates circa 1978. She had a Neg Myoview in 2017. Patient's BP has been controlled at home and patient denies any cardiac symptoms as chest pain, palpitations, shortness of breath, dizziness or ankle swelling. Renal Artery Duplex in 2014 found no Renal Aa stenosis.  Today's BP is at goal - 114/74.      Patient's hyperlipidemia is controlled with diet and medications. Patient denies myalgias or other medication SE's. Last lipids were at goal albeit elevated Trig's:  Lab Results  Component Value Date   CHOL 162 07/25/2017   HDL 45 (L) 07/25/2017   LDLCALC 71 07/25/2017   TRIG 382 (H) 07/25/2017   CHOLHDL 3.6 07/25/2017      Patient has T2_NIDDM (2006) /CKD3 (GFR 57) and patient denies reactive hypoglycemic symptoms, visual blurring, diabetic polys, or paresthesias. Last A1c was not at goal: Lab Results  Component Value Date   HGBA1C 6.6 (H) 07/25/2017      Finally, patient has history of Vitamin D Deficiency ("19"/2008) and last Vitamin D was at goal: Lab Results  Component Value Date   VD25OH 61 04/21/2017   Current Outpatient Medications on File Prior to Visit  Medication Sig  . ALPRAZolam (XANAX) 0.5 MG tablet Take 1 tablet (0.5 mg total) by mouth at bedtime as needed for anxiety.  . APPLE CIDER VINEGAR PO Take by mouth.  Marland Kitchen aspirin 325 MG EC tablet Take 325 mg by mouth daily.  . Biotin 1000  MCG tablet Take 1,000 mcg by mouth daily. Reported on 08/22/2015  . Blood Glucose Monitoring Suppl (ONE TOUCH ULTRA SYSTEM KIT) w/Device KIT Check blood sugar 1 time daily-DX-E11.21  . Cholecalciferol (VITAMIN D PO) Take 10,000 Units by mouth daily.   Marland Kitchen CINNAMON PO Take by mouth.  . citalopram (CELEXA) 40 MG tablet Take 1 tab daily for anxiety and mood. (Patient taking differently: 20 mg. Take 53m tab twice daily for anxiety and mood.)  . clonazePAM (KLONOPIN) 1 MG tablet Take 1/2 to 1 tablet 2 to 3 x / day & please try to limit to 5 days /week to avoid addiction  . furosemide (LASIX) 40 MG tablet Take 1 tablet (40 mg total) daily by mouth.  .Marland Kitchenglucose blood test strip Check blood sugar 2 times daily-DX-E11.21. Please dispense One Touch Ultra test strips.  . Iron TABS Take 325 mg by mouth 2 (two) times daily.   . Lancets (ONETOUCH ULTRASOFT) lancets Check blood sugar 1 time daily-DX-E11.21  . Magnesium 500 MG TABS Take 1 tablet by mouth 3 (three) times daily.  . metFORMIN (GLUCOPHAGE-XR) 500 MG 24 hr tablet TAKE ONE TABLET BY MOUTH THREE TIMES DAILY  . minoxidil (LONITEN) 10 MG tablet TAKE 1/2TABLET BY MOUTH AT SUPPERTIME  . Omega-3 Fatty Acids (FISH OIL) 1000 MG CAPS Take 1,000 mg by mouth daily. Reported on 08/22/2015  . rosuvastatin (CRESTOR) 20 MG tablet Take 1/2 tablet three times a week  . telmisartan (MICARDIS) 80 MG tablet Take 1  tablet daily for BP  . verapamil (CALAN-SR) 240 MG CR tablet Take 1 tablet (240 mg total) by mouth at bedtime.   No current facility-administered medications on file prior to visit.    Allergies  Allergen Reactions  . Lipitor [Atorvastatin]     Elevates LFT's   Past Medical History:  Diagnosis Date  . Anxiety   . Arthritis   . Carpal tunnel syndrome   . Cerebrovascular disease 01/02/2017  . Duodenal diverticulum   . Esophageal motility disorder   . Fatty liver   . GERD (gastroesophageal reflux disease)   . Hyperlipidemia   . Hypertension   .  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 Tubulovillous adenoma of colon   . Type II or unspecified type diabetes mellitus without mention of complication, not stated as uncontrolled    Health Maintenance  Topic Date Due  . COLONOSCOPY  09/22/2017  . INFLUENZA VACCINE  01/01/2018  . FOOT EXAM  01/09/2018  . HEMOGLOBIN A1C  01/22/2018  . OPHTHALMOLOGY EXAM  07/08/2018  . MAMMOGRAM  03/25/2019  . TETANUS/TDAP  03/26/2023  . DEXA SCAN  Completed  . Hepatitis C Screening  Completed  . PNA vac Low Risk Adult  Completed   Immunization History  Administered Date(s) Administered  . Influenza Split 03/25/2013  . Influenza, High Dose Seasonal PF 03/15/2014, 03/07/2015, 03/27/2016, 04/21/2017  . Pneumococcal Conjugate-13 01/26/2014  . Pneumococcal Polysaccharide-23 03/14/2011  . Td 03/25/2013  . Zoster 06/03/2005   Last Colon - 09/22/2012 - Dr Delfin Edis - Dx Lymphocytic colitis - recc 5 yr f/u due 09/2017 Last MGM - 03/24/2017  Past Surgical History:  Procedure Laterality Date  . APPENDECTOMY    . BREAST BIOPSY Right   . CARPAL TUNNEL RELEASE Right 1992  . CYST REMOVAL HAND    . TONSILLECTOMY  1952   Family History  Problem Relation Age of Onset  . Heart disease Mother   . Diabetes Mother   . Colon polyps Mother   . Heart disease Father   . Diabetes Father   . Heart attack Father   . Heart disease Brother   . Heart attack Brother   . Cerebral aneurysm Sister   . Heart disease Brother        Stent   Social History   Tobacco Use  . Smoking status: Never Smoker  . Smokeless tobacco: Never Used  Substance Use Topics  . Alcohol use: No  . Drug use: No    ROS Constitutional: Denies fever, chills, weight loss/gain, headaches, insomnia,  night sweats, and change in appetite. Does c/o fatigue. Eyes: Denies redness, blurred vision, diplopia, discharge, itchy, watery eyes.  ENT: Denies discharge, congestion, post nasal drip, epistaxis, sore throat, earache, hearing loss, dental pain,  Tinnitus, Vertigo, Sinus pain, snoring.  Cardio: Denies chest pain, palpitations, irregular heartbeat, syncope, dyspnea, diaphoresis, orthopnea, PND, claudication, edema Respiratory: denies cough, dyspnea, DOE, pleurisy, hoarseness, laryngitis, wheezing.  Gastrointestinal: Denies dysphagia, heartburn, reflux, water brash, pain, cramps, nausea, vomiting, bloating, diarrhea, constipation, hematemesis, melena, hematochezia, jaundice, hemorrhoids Genitourinary: Denies dysuria, frequency, urgency, nocturia, hesitancy, discharge, hematuria, flank pain Breast: Breast lumps, nipple discharge, bleeding.  Musculoskeletal: Denies arthralgia, myalgia, stiffness, Jt. Swelling, pain, limp, and strain/sprain. Denies falls. Skin: Denies puritis, rash, hives, warts, acne, eczema, changing in skin lesion Neuro: No weakness, tremor, incoordination, spasms, paresthesia, pain Psychiatric: Denies confusion, memory loss, sensory loss. Denies Depression. Endocrine: Denies change in weight, skin, hair change, nocturia, and paresthesia, diabetic polys, visual blurring, hyper / hypo glycemic episodes.  Heme/Lymph: No excessive bleeding, bruising, enlarged lymph nodes.  Physical Exam  BP 114/74   Pulse 76   Temp 97.6 F (36.4 C)   Resp 16   Ht 5' 3.5" (1.613 m)   Wt 154 lb 9.6 oz (70.1 kg)   BMI 26.96 kg/m   General Appearance: Well nourished, well groomed and in no apparent distress.  Eyes: PERRLA, EOMs, conjunctiva no swelling or erythema, normal fundi and vessels. Sinuses: No frontal/maxillary tenderness ENT/Mouth: EACs patent / TMs  nl. Nares clear without erythema, swelling, mucoid exudates. Oral hygiene is good. No erythema, swelling, or exudate. Tongue normal, non-obstructing. Tonsils not swollen or erythematous. Hearing normal.  Neck: Supple, thyroid not palpable. No bruits, nodes or JVD. Respiratory: Respiratory effort normal.  BS equal and clear bilateral without rales, rhonci, wheezing or  stridor. Cardio: Heart sounds are normal with regular rate and rhythm and no murmurs, rubs or gallops. Peripheral pulses are normal and equal bilaterally without edema. No aortic or femoral bruits. Chest: symmetric with normal excursions and percussion. Breasts: Symmetric, without lumps, nipple discharge, retractions, or fibrocystic changes.  Abdomen: Flat, soft with bowel sounds active. Nontender, no guarding, rebound, hernias, masses, or organomegaly.  Lymphatics: Non tender without lymphadenopathy.  Genitourinary:  Musculoskeletal: Full ROM all peripheral extremities, joint stability, 5/5 strength, and normal gait. Skin: Warm and dry without rashes, lesions, cyanosis, clubbing or  ecchymosis.  Neuro: Cranial nerves intact, reflexes equal bilaterally. Normal muscle tone, no cerebellar symptoms. Sensation intact.  Pysch: Alert and oriented X 3, normal affect, Insight and Judgment appropriate.   Assessment and Plan  1. Annual Preventative Screening Examination            Patient was counseled in prudent diet to achieve/maintain BMI less than 25 for weight control, BP monitoring, regular exercise and medications. Discussed med's effects and SE's. Screening labs and tests as requested with regular follow-up as recommended. Over 40 minutes of exam, counseling, chart review and high complex critical decision making was performed.

## 2017-11-07 ENCOUNTER — Other Ambulatory Visit: Payer: Self-pay | Admitting: Internal Medicine

## 2017-11-07 ENCOUNTER — Other Ambulatory Visit: Payer: Self-pay | Admitting: *Deleted

## 2017-11-07 DIAGNOSIS — N390 Urinary tract infection, site not specified: Secondary | ICD-10-CM

## 2017-11-07 LAB — MICROALBUMIN / CREATININE URINE RATIO
CREATININE, URINE: 79 mg/dL (ref 20–275)
MICROALB/CREAT RATIO: 13 ug/mg{creat} (ref <30)
Microalb, Ur: 1 mg/dL

## 2017-11-07 LAB — CBC WITH DIFFERENTIAL/PLATELET
Basophils Absolute: 37 cells/uL (ref 0–200)
Basophils Relative: 0.6 %
Eosinophils Absolute: 161 cells/uL (ref 15–500)
Eosinophils Relative: 2.6 %
HCT: 40.7 % (ref 35.0–45.0)
Hemoglobin: 13.7 g/dL (ref 11.7–15.5)
Lymphs Abs: 1934 cells/uL (ref 850–3900)
MCH: 28.6 pg (ref 27.0–33.0)
MCHC: 33.7 g/dL (ref 32.0–36.0)
MCV: 85 fL (ref 80.0–100.0)
MPV: 9.3 fL (ref 7.5–12.5)
Monocytes Relative: 8.6 %
NEUTROS PCT: 57 %
Neutro Abs: 3534 cells/uL (ref 1500–7800)
PLATELETS: 202 10*3/uL (ref 140–400)
RBC: 4.79 10*6/uL (ref 3.80–5.10)
RDW: 13.9 % (ref 11.0–15.0)
TOTAL LYMPHOCYTE: 31.2 %
WBC: 6.2 10*3/uL (ref 3.8–10.8)
WBCMIX: 533 {cells}/uL (ref 200–950)

## 2017-11-07 LAB — URINALYSIS, ROUTINE W REFLEX MICROSCOPIC
BILIRUBIN URINE: NEGATIVE
Glucose, UA: NEGATIVE
HGB URINE DIPSTICK: NEGATIVE
HYALINE CAST: NONE SEEN /LPF
Ketones, ur: NEGATIVE
Nitrite: NEGATIVE
PROTEIN: NEGATIVE
SQUAMOUS EPITHELIAL / LPF: NONE SEEN /HPF (ref <=5)
Specific Gravity, Urine: 1.013 (ref 1.001–1.03)
pH: 7 (ref 5.0–8.0)

## 2017-11-07 LAB — LIPID PANEL
Cholesterol: 134 mg/dL (ref <200)
HDL: 44 mg/dL — AB (ref >50)
LDL Cholesterol (Calc): 67 mg/dL (calc)
NON-HDL CHOLESTEROL (CALC): 90 mg/dL (ref <130)
Total CHOL/HDL Ratio: 3 (calc) (ref <5.0)
Triglycerides: 152 mg/dL — ABNORMAL HIGH (ref <150)

## 2017-11-07 LAB — COMPLETE METABOLIC PANEL WITH GFR
AG RATIO: 2 (calc) (ref 1.0–2.5)
ALKALINE PHOSPHATASE (APISO): 78 U/L (ref 33–130)
ALT: 32 U/L — AB (ref 6–29)
AST: 22 U/L (ref 10–35)
Albumin: 4.6 g/dL (ref 3.6–5.1)
BILIRUBIN TOTAL: 1.1 mg/dL (ref 0.2–1.2)
BUN/Creatinine Ratio: 18 (calc) (ref 6–22)
BUN: 18 mg/dL (ref 7–25)
CHLORIDE: 103 mmol/L (ref 98–110)
CO2: 28 mmol/L (ref 20–32)
Calcium: 10 mg/dL (ref 8.6–10.4)
Creat: 1 mg/dL — ABNORMAL HIGH (ref 0.60–0.93)
GFR, EST AFRICAN AMERICAN: 66 mL/min/{1.73_m2} (ref >=60)
GFR, Est Non African American: 57 mL/min/{1.73_m2} — ABNORMAL LOW (ref >=60)
GLUCOSE: 93 mg/dL (ref 65–99)
Globulin: 2.3 g/dL (calc) (ref 1.9–3.7)
Potassium: 4.6 mmol/L (ref 3.5–5.3)
Sodium: 141 mmol/L (ref 135–146)
Total Protein: 6.9 g/dL (ref 6.1–8.1)

## 2017-11-07 LAB — HEMOGLOBIN A1C
EAG (MMOL/L): 5.8 (calc)
Hgb A1c MFr Bld: 5.3 % of total Hgb (ref <5.7)
Mean Plasma Glucose: 105 (calc)

## 2017-11-07 LAB — TSH: TSH: 0.91 mIU/L (ref 0.40–4.50)

## 2017-11-07 LAB — INSULIN, RANDOM: Insulin: 19.7 u[IU]/mL — ABNORMAL HIGH (ref 2.0–19.6)

## 2017-11-07 LAB — URIC ACID: Uric Acid, Serum: 5.9 mg/dL (ref 2.5–7.0)

## 2017-11-07 LAB — MAGNESIUM: MAGNESIUM: 2.3 mg/dL (ref 1.5–2.5)

## 2017-11-07 LAB — VITAMIN D 25 HYDROXY (VIT D DEFICIENCY, FRACTURES): Vit D, 25-Hydroxy: 62 ng/mL (ref 30–100)

## 2017-11-07 MED ORDER — ACCU-CHEK FASTCLIX LANCETS MISC: 3 refills | Status: DC

## 2017-11-07 MED ORDER — GLUCOSE BLOOD VI STRP: ORAL_STRIP | 12 refills | Status: DC

## 2017-11-07 MED ORDER — CIPROFLOXACIN HCL 500 MG PO TABS: 500.0000 mg | ORAL_TABLET | Freq: Two times a day (BID) | ORAL | 0 refills | Status: AC

## 2017-11-07 MED ORDER — ACCU-CHEK FASTCLIX LANCET KIT: PACK | 0 refills | Status: DC

## 2017-11-07 MED ORDER — METFORMIN HCL ER 500 MG PO TB24: 500.0000 mg | ORAL_TABLET | Freq: Three times a day (TID) | ORAL | 3 refills | Status: DC

## 2017-11-07 MED ORDER — ACCU-CHEK AVIVA PLUS W/DEVICE KIT: PACK | 0 refills | Status: DC

## 2017-11-09 ENCOUNTER — Encounter: Payer: Self-pay | Admitting: Internal Medicine

## 2017-11-09 DIAGNOSIS — L80 Vitiligo: Secondary | ICD-10-CM | POA: Insufficient documentation

## 2017-11-10 ENCOUNTER — Other Ambulatory Visit: Payer: Self-pay | Admitting: *Deleted

## 2017-11-10 MED ORDER — ONETOUCH ULTRA 2 W/DEVICE KIT: PACK | 0 refills | Status: DC

## 2017-11-10 MED ORDER — ONETOUCH ULTRASOFT LANCETS MISC: 4 refills | Status: DC

## 2017-11-10 MED ORDER — GLUCOSE BLOOD VI STRP: ORAL_STRIP | 4 refills | Status: DC

## 2017-11-17 ENCOUNTER — Encounter: Payer: Self-pay | Admitting: Internal Medicine

## 2017-11-24 ENCOUNTER — Other Ambulatory Visit: Payer: Self-pay | Admitting: Internal Medicine

## 2017-11-24 DIAGNOSIS — R450 Nervousness: Secondary | ICD-10-CM

## 2017-11-24 MED ORDER — CLONAZEPAM 1 MG PO TABS: ORAL_TABLET | ORAL | 0 refills | Status: DC

## 2017-12-01 ENCOUNTER — Encounter: Payer: Self-pay | Admitting: Internal Medicine

## 2017-12-09 ENCOUNTER — Ambulatory Visit (INDEPENDENT_AMBULATORY_CARE_PROVIDER_SITE_OTHER): Payer: PPO

## 2017-12-09 DIAGNOSIS — N390 Urinary tract infection, site not specified: Secondary | ICD-10-CM | POA: Diagnosis not present

## 2017-12-09 NOTE — Progress Notes (Signed)
Pt reports for UC & UA.  Pt reports no concerns & reports finishing ABX.

## 2017-12-10 LAB — URINALYSIS, ROUTINE W REFLEX MICROSCOPIC
BILIRUBIN URINE: NEGATIVE
Glucose, UA: NEGATIVE
HGB URINE DIPSTICK: NEGATIVE
KETONES UR: NEGATIVE
Leukocytes, UA: NEGATIVE
Nitrite: NEGATIVE
PROTEIN: NEGATIVE
SPECIFIC GRAVITY, URINE: 1.018 (ref 1.001–1.03)
pH: 6 (ref 5.0–8.0)

## 2017-12-10 LAB — URINE CULTURE
MICRO NUMBER:: 90812766
SPECIMEN QUALITY: ADEQUATE

## 2017-12-16 ENCOUNTER — Ambulatory Visit (INDEPENDENT_AMBULATORY_CARE_PROVIDER_SITE_OTHER): Payer: PPO | Admitting: Neurology

## 2017-12-16 DIAGNOSIS — F3289 Other specified depressive episodes: Secondary | ICD-10-CM

## 2017-12-16 DIAGNOSIS — N183 Chronic kidney disease, stage 3 unspecified: Secondary | ICD-10-CM

## 2017-12-16 DIAGNOSIS — G4710 Hypersomnia, unspecified: Secondary | ICD-10-CM

## 2017-12-16 DIAGNOSIS — G4739 Other sleep apnea: Secondary | ICD-10-CM

## 2017-12-16 DIAGNOSIS — R270 Ataxia, unspecified: Secondary | ICD-10-CM

## 2017-12-16 DIAGNOSIS — G4736 Sleep related hypoventilation in conditions classified elsewhere: Secondary | ICD-10-CM

## 2017-12-16 DIAGNOSIS — G4719 Other hypersomnia: Secondary | ICD-10-CM

## 2017-12-16 DIAGNOSIS — G4731 Primary central sleep apnea: Secondary | ICD-10-CM

## 2017-12-16 DIAGNOSIS — IMO0002 Reserved for concepts with insufficient information to code with codable children: Secondary | ICD-10-CM

## 2017-12-16 DIAGNOSIS — I679 Cerebrovascular disease, unspecified: Secondary | ICD-10-CM

## 2017-12-16 DIAGNOSIS — E1122 Type 2 diabetes mellitus with diabetic chronic kidney disease: Secondary | ICD-10-CM

## 2017-12-16 DIAGNOSIS — I129 Hypertensive chronic kidney disease with stage 1 through stage 4 chronic kidney disease, or unspecified chronic kidney disease: Secondary | ICD-10-CM

## 2017-12-30 NOTE — Procedures (Signed)
PATIENT'S NAME:  Michele [name], Michele [name] DOB:      1945-11-15      MR#:    [id number]     DATE OF RECORDING: 12/16/2017 REFERRING M.D.:  Jacalyn Lefevre MD Study Performed:   Baseline Polysomnogram HISTORY:  Mrs. Michele [name] is a 72 year old female patient who has felt tired excessively sleepy in daytime for the last 2 or 3 years, correlated with her admission to hospital.  At the time she had been suffering from very high blood pressures( malignant hypertension) and since that time she has not felt ever the same.  She did not need daytime naps before.  She also has chronic kidney disease stage III due to diabetes mellitus and hypertension, chronic disease anemia, CVA-diffuse cerebrovascular disease presenting with dizziness and bradycardia. She has such severe bradycardia that her ophthalmologist refused a cataract surgery. Patient is seeing Dr. Jannifer Franklin to whom Michele Rankin PA also referred for further work-up of neurovascular disease, gait disorder and balance problems.   She had her last heart rate at Dr. Melford Aase office measured at 37 bpm and erratic, she has been on atenolol 25 mg twice daily, clonidine 0.1 twice daily which was stopped in February and lisinopril as well as minoxidil and verapamil- and just added Telmisartan with Dr. Debara Pickett, MD. He removed clonidin and atenolol. Her blood pressure readings at home have been the lowest at 89/44 mmHg, the lowest heart rate at home was 42 bpm and it is very likely that her blood pressure medicines contribute to her sleepiness.  The patient endorsed the Epworth Sleepiness Scale at 17/24 points.   The patient's weight 155 pounds with a height of 64 (inches), resulting in a BMI of 26.3 kg/m2. The patient's neck circumference measured 16 inches.  CURRENT MEDICATIONS: Aspirin, Biotin, Vitamin D, Celexa, Klonopin, Lasix, Glucophage, Omega-3, Crestor, Micardis,    PROCEDURE:  This is a multichannel digital polysomnogram utilizing the Somnostar 11.2 system.  Electrodes  and sensors were applied and monitored per AASM Specifications.   EEG, EOG, Chin and Limb EMG, were sampled at 200 Hz.  ECG, Snore and Nasal Pressure, Thermal Airflow, Respiratory Effort, CPAP Flow and Pressure, Oximetry was sampled at 50 Hz. Digital video and audio were recorded.      BASELINE STUDY: Lights Out was at 21:14 and Lights On at 04:52. Total recording time (TRT) was 459 minutes, with a total sleep time (TST) of 418.5 minutes. The patient's sleep latency was 7.5 minutes. REM latency was 219.5 minutes. The sleep efficiency was 91.2 %.     SLEEP ARCHITECTURE: WASO (Wake after sleep onset) was 32 minutes.  There were 10 minutes in Stage N1, 299.5 minutes Stage N2, 73.5 minutes Stage N3 and 35.5 minutes in Stage REM.  The percentage of Stage N1 was 2.4%, Stage N2 was 71.6%, Stage N3 was 17.6% and Stage R (REM sleep) was 8.5%.     RESPIRATORY ANALYSIS:  There were a total of 303 respiratory events:  76 obstructive apneas, 9 central apneas and 3 mixed apneas with a total of 88 apneas and an apnea index (AI) of 12.6 /hour. There were 215 hypopneas with a hypopnea index of 30.8 /hour. The patient also had 0 respiratory event related arousals (RERAs). The total APNEA/HYPOPNEA INDEX (AHI) was 43.4 /hour.  17 events occurred in REM sleep and 416 events in NREM. The REM AHI was 28.7 /hour, versus a non-REM AHI of 44.8. The patient spent 214 minutes of total sleep time in the supine position and 205  minutes in non-supine. The supine AHI was 54.7 versus a non-supine AHI of 31.7.  OXYGEN SATURATION & C02:  The Wake baseline 02 saturation was 90%, with the lowest being 75%. Time spent below 89% saturation equaled 223 minutes. Average End Tidal CO2 during sleep was 35 torr.  Peak End Tidal CO2 during REM sleep was 42 torr.  Peak End Tidal CO2 during NREM sleep was 38 torr. Total sleep time greater than 50 torr was 0.60 minutes.   PERIODIC LIMB MOVEMENTS:   The patient had a total of 25 Periodic Limb  Movements.  The Periodic Limb Movement (PLM) index was 3.6 and the PLM Arousal index was 1./hour. The arousals were noted as: 93 were spontaneous, 7 were associated with PLMs, and 135 were associated with respiratory events.  Audio and video analysis did not show any abnormal or unusual movements, behaviors, phonations or vocalizations.  Nocturia times 2.  EKG: Average heart rate was 58 - regular sinus rhythm. No longer bradycardia and arrhythmia noted.  IMPRESSION:  1. Complex and mostly Obstructive Sleep Apnea (OSA), severe degree.  2. Supine accentuated AHI, but not REM accentuated.  3. Prolonged hypoxemia and clinically less significant degree of hypercapnia.    RECOMMENDATIONS:  1. Advise full-night, attended, CPAP titration study to optimize therapy.     I certify that I have reviewed the entire raw data recording prior to the issuance of this report in accordance with the Standards of Accreditation of the [place] [employer] (AASM)   Larey Seat, MD   12-26-2017  Diplomat, American Board of Psychiatry and Neurology  Diplomat, [employer] Board [employer] Director, [facility] [employer]

## 2017-12-30 NOTE — Addendum Note (Signed)
Addended by: Larey Seat on: 12/30/2017 06:15 PM   Modules accepted: Orders

## 2017-12-31 ENCOUNTER — Telehealth: Payer: Self-pay | Admitting: Neurology

## 2017-12-31 NOTE — Telephone Encounter (Signed)
-----   Message from Larey Seat, MD sent at 12/30/2017  6:15 PM EDT ----- IMPRESSION:  1. Complex and mostly Obstructive Sleep Apnea (OSA), severe  degree.  2. Supine accentuated AHI, but not REM accentuated.  3. Prolonged hypoxemia and clinically less significant degree of  hypercapnia.   RECOMMENDATIONS:  1. Advise full-night, attended, CPAP titration study to optimize  therapy. Dental device would not help this apnea in severity, complexity- and hypoxemia was too severe.  An unattended auto titration is not recommended as central apneas can arise.

## 2017-12-31 NOTE — Telephone Encounter (Signed)
Called patient to discuss sleep study results. No answer at this time. LVM for the patient to call back.   

## 2018-01-01 NOTE — Telephone Encounter (Signed)

## 2018-01-05 ENCOUNTER — Ambulatory Visit (INDEPENDENT_AMBULATORY_CARE_PROVIDER_SITE_OTHER): Payer: PPO | Admitting: Neurology

## 2018-01-05 DIAGNOSIS — G4731 Primary central sleep apnea: Secondary | ICD-10-CM

## 2018-01-05 DIAGNOSIS — I129 Hypertensive chronic kidney disease with stage 1 through stage 4 chronic kidney disease, or unspecified chronic kidney disease: Secondary | ICD-10-CM

## 2018-01-05 DIAGNOSIS — IMO0002 Reserved for concepts with insufficient information to code with codable children: Secondary | ICD-10-CM

## 2018-01-05 DIAGNOSIS — E1122 Type 2 diabetes mellitus with diabetic chronic kidney disease: Secondary | ICD-10-CM

## 2018-01-05 DIAGNOSIS — G4736 Sleep related hypoventilation in conditions classified elsewhere: Secondary | ICD-10-CM

## 2018-01-05 DIAGNOSIS — G4719 Other hypersomnia: Secondary | ICD-10-CM

## 2018-01-05 DIAGNOSIS — N183 Chronic kidney disease, stage 3 (moderate): Secondary | ICD-10-CM

## 2018-01-05 DIAGNOSIS — I679 Cerebrovascular disease, unspecified: Secondary | ICD-10-CM

## 2018-01-06 ENCOUNTER — Ambulatory Visit
Admission: RE | Admit: 2018-01-06 | Discharge: 2018-01-06 | Disposition: A | Discharge status: Home / Self Care | Payer: PPO | Source: Ambulatory Visit | Attending: Adult Health | Admitting: Adult Health

## 2018-01-06 DIAGNOSIS — Z780 Asymptomatic menopausal state: Secondary | ICD-10-CM | POA: Diagnosis not present

## 2018-01-06 DIAGNOSIS — M85851 Other specified disorders of bone density and structure, right thigh: Secondary | ICD-10-CM | POA: Diagnosis not present

## 2018-01-06 DIAGNOSIS — E2839 Other primary ovarian failure: Secondary | ICD-10-CM

## 2018-01-09 NOTE — Procedures (Signed)
PATIENT'S NAME:  Michele [name], Kady DOB:      30-Jul-1945      MR#:    [id number]     DATE OF RECORDING: 01/05/2018 REFERRING M.D.:  Jacalyn Lefevre, M.D. Study Performed:    Titration to Positive Airway Pressure HISTORY:  Michele [name] is returning for a CPAP Titration sleep study, following her Baseline PSG from 12/16/17, which resulted in a diagnosis of Severe Complex Sleep Apnea with an AHI of 43.4/h, REM AHI of 28.7, and a supine AHI of 54.7/h. This Patient had 233 minutes total time in hypoxia, an oxygen saturation nadir of 79% and was asked to return also in case oxygen supplement is needed. The patient endorsed the Epworth Sleepiness Scale at 17/24 points.  The patient's weight 155 pounds with a height of 64 (inches), resulting in a BMI of 26.3 kg/m2.The patient's neck circumference measured 16 inches.  CURRENT MEDICATIONS: Apple cider vinegar, Aspirin, Biotin, Vitamin D, Celexa, Klonopin, Lasix, Magnesium, Glucophage, Minoxidil, Omega-3, Crestor, Micardis, Calan   PROCEDURE:  This is a multichannel digital polysomnogram utilizing the SomnoStar 11.2 system.  Electrodes and sensors were applied and monitored per AASM Specifications.   EEG, EOG, Chin and Limb EMG, were sampled at 200 Hz.  ECG, Snore and Nasal Pressure, Thermal Airflow, Respiratory Effort, CPAP Flow and Pressure, Oximetry was sampled at 50 Hz. Digital video and audio were recorded.      CPAP was initiated at 5 cmH20 with heated humidity per AASM split night standards and pressure was advanced to 8 cmH20 because of hypopneas, apneas and desaturations.  At a PAP pressure of 7 cmH20, there was a reduction of the AHI to 0.8/h. with improvement of sleep apnea and oxygen nadir and a high sleep efficiency was reached- indicating the treatment is well tolerated. A ResMed AirFit P 10 was applied with a chinstrap. I would opt to change to a   Lights Out was at 22:22 and Lights On at 05:00. Total recording time (TRT) was 398 minutes, with a  total sleep time (TST) of 340.5 minutes. The patient's sleep latency was 31 minutes. REM latency was 0 minutes.  The sleep efficiency was 85.6%.    SLEEP ARCHITECTURE: WASO (Wake after sleep onset) was 32 minutes.  There were 8 minutes in Stage N1, 262.5 minutes Stage N2, 70 minutes Stage N3 and 0 minutes in Stage REM.  The percentage of Stage N1 was 2.3%, Stage N2 was 77.1%, Stage N3 was 20.6% and Stage R (REM sleep) was 0.0%  RESPIRATORY ANALYSIS:  There was a total of 7 respiratory events: 5 obstructive apneas, 0 central apneas and 0 mixed apneas with a total of 5 apneas and an apnea index (AI) of 0.9 /hour. There were 2 hypopneas with a hypopnea index of 0.4/hour. The patient also had 0 respiratory event related arousals (RERAs).      The total APNEA/HYPOPNEA INDEX (AHI) was 1.2 /hour and the total RESPIRATORY DISTURBANCE INDEX was 1.2/hour and 0 events occurred in REM sleep and 7 events in NREM. The REM AHI was 0.0 /hour versus a non-REM AHI of 1.2 /hour.  The patient spent 197 minutes of total sleep time in the supine position and 144 minutes in non-supine. The supine AHI was 0.6, versus a non-supine AHI of 2.1.  OXYGEN SATURATION & C02:  The baseline 02 saturation was 92%, with the lowest being 88%. Time spent below 89% saturation equaled 1 minute.  PERIODIC LIMB MOVEMENTS:  The patient had a total of 34 Periodic Limb  Movements. The Periodic Limb Movement (PLM) index was 6.0 and the PLM Arousal index was 0.5 /hour.  The arousals were noted as: 54 were spontaneous, 3 were associated with PLMs, and 3 were associated with respiratory events.  Audio and video analysis did not show any abnormal or unusual movements, behaviors, phonations or vocalizations. The patient took one bathroom break. EKG was in keeping with normal sinus rhythm (NSR). Post-study, the patient indicated that sleep was the same as usual.   DIAGNOSIS 1. Complex and mostly Obstructive severe Sleep Apnea responded well to CPAP at  8 and 9 cm water, and hypoxia resolved under CPAP without added oxygen as well.  2. Nasal pillow was used - The technician applied a chinstrap during this titration, the chinstrap is optional.    PLANS/RECOMMENDATIONS:  1. The patient should avoid evening sedatives, hypnotics, and alcohol beverage consumption 2. I will forward a CPAP order to DME with the use of heated humidity, mask of patient's choice, and auto settings pressure window from 5-10 cm water with 2 cm EPR.    DISCUSSION: Please educate patient in CPAP compliance and minimum use time of 4 hours nightly.   A follow up appointment will be scheduled in the Sleep Clinic at [facility].   Please call [phone] with any questions.     I certify that I have reviewed the entire raw data recording prior to the issuance of this report in accordance with the Standards of Accreditation of the American Academy of Sleep Medicine (AASM)     Larey Seat, M.D.   01-09-2018  Diplomat, American Board of Psychiatry and Neurology  Diplomat, [employer] Medical Director, [place] [facility]

## 2018-01-09 NOTE — Addendum Note (Signed)
Addended by: Larey Seat on: 01/09/2018 12:23 PM   Modules accepted: Orders

## 2018-01-12 ENCOUNTER — Telehealth: Payer: Self-pay | Admitting: Neurology

## 2018-01-12 NOTE — Telephone Encounter (Signed)
-----   Message from Larey Seat, MD sent at 01/09/2018 12:23 PM EDT ----- DIAGNOSIS 1. Complex and mostly Obstructive severe Sleep Apnea responded  well to CPAP at 8 and 9 cm water, and hypoxia resolved under CPAP  without added oxygen as well.  2. Nasal pillow was used - The technician applied a chinstrap  during this titration, the chinstrap is optional.   PLANS/RECOMMENDATIONS:  1. The patient should avoid evening sedatives, hypnotics, and  alcohol beverage consumption ( if applicable).  2. I will forward a CPAP order to DME with the use of heated  humidity, mask of patient's choice, and auto settings pressure  window from 5-10 cm water with 2 cm EPR.

## 2018-01-12 NOTE — Telephone Encounter (Signed)
I called pt. I advised pt that Dr. Brett Fairy reviewed their sleep study results and found that pt has sleep apnea. Dr. Brett Fairy recommends that pt starts a auto CPAP. I reviewed PAP compliance expectations with the pt. Pt is agreeable to starting a CPAP. I advised pt that an order will be sent to a DME, [facility], and [facility] will call the pt within about one week after they file with the pt's insurance. [facility] will show the pt how to use the machine, fit for masks, and troubleshoot the CPAP if needed. A follow up appt was made for insurance purposes with Ward Givens, NP on Nov 7,2019 at 10:00 am . Pt verbalized understanding to arrive 15 minutes early and bring their CPAP. A letter with all of this information in it will be mailed to the pt as a reminder. I verified with the pt that the address we have on file is correct. Pt verbalized understanding of results. Pt had no questions at this time but was encouraged to call back if questions arise.

## 2018-01-15 NOTE — Telephone Encounter (Signed)
err

## 2018-01-27 ENCOUNTER — Ambulatory Visit (AMBULATORY_SURGERY_CENTER): Payer: Self-pay | Admitting: *Deleted

## 2018-01-27 ENCOUNTER — Other Ambulatory Visit: Payer: Self-pay

## 2018-01-27 VITALS — Ht 63.0 in | Wt 151.4 lb

## 2018-01-27 DIAGNOSIS — Z86010 Personal history of colonic polyps: Secondary | ICD-10-CM

## 2018-01-27 DIAGNOSIS — G4733 Obstructive sleep apnea (adult) (pediatric): Secondary | ICD-10-CM | POA: Diagnosis not present

## 2018-01-27 MED ORDER — PLENVU 140 G PO SOLR: 1.0000 | Freq: Once | ORAL | 0 refills | Status: AC

## 2018-01-27 NOTE — Progress Notes (Signed)
No egg or soy allergy known to patient  No issues with past sedation with any surgeries  or procedures, no intubation problems  No diet pills per patient No home 02 use per patient  No blood thinners per patient  Pt  has  issues with constipation , taking a combination of senokot,miralax and milk  f mag. In the 2 months. Patient has stopped taking her iron related to this problem. Patient given 2 day prep instructions using Plenvu and mag citrate plenu sample given lot # [id number], exp 12/2018 No A fib or A flutter  EMMI video sent to pt's e mail

## 2018-01-28 ENCOUNTER — Encounter: Payer: Self-pay | Admitting: Internal Medicine

## 2018-02-09 ENCOUNTER — Other Ambulatory Visit: Payer: Self-pay | Admitting: Internal Medicine

## 2018-02-09 ENCOUNTER — Ambulatory Visit: Payer: Self-pay | Admitting: Physician Assistant

## 2018-02-10 ENCOUNTER — Ambulatory Visit (AMBULATORY_SURGERY_CENTER): Payer: PPO | Admitting: Internal Medicine

## 2018-02-10 ENCOUNTER — Encounter: Payer: Self-pay | Admitting: Internal Medicine

## 2018-02-10 VITALS — BP 106/50 | HR 75 | Temp 97.3°F | Resp 13 | Ht 63.5 in | Wt 154.0 lb

## 2018-02-10 DIAGNOSIS — D120 Benign neoplasm of cecum: Secondary | ICD-10-CM | POA: Diagnosis not present

## 2018-02-10 DIAGNOSIS — I10 Essential (primary) hypertension: Secondary | ICD-10-CM | POA: Diagnosis not present

## 2018-02-10 DIAGNOSIS — Z86010 Personal history of colonic polyps: Secondary | ICD-10-CM

## 2018-02-10 DIAGNOSIS — D123 Benign neoplasm of transverse colon: Secondary | ICD-10-CM

## 2018-02-10 DIAGNOSIS — G4733 Obstructive sleep apnea (adult) (pediatric): Secondary | ICD-10-CM | POA: Diagnosis not present

## 2018-02-10 DIAGNOSIS — E119 Type 2 diabetes mellitus without complications: Secondary | ICD-10-CM | POA: Diagnosis not present

## 2018-02-10 MED ORDER — SODIUM CHLORIDE 0.9 % IV SOLN: 500.0000 mL | Freq: Once | INTRAVENOUS | Status: DC

## 2018-02-10 NOTE — Progress Notes (Signed)
Pt's states no medical or surgical changes since previsit or office visit. 

## 2018-02-10 NOTE — Op Note (Signed)
[facility] Patient Name: Michele [name] Procedure Date: 02/10/2018 8:40 AM MRN: [id number] Endoscopist: Docia Chuck. Henrene Pastor , MD Age: 72 Referring MD:  Date of Birth: 1946-01-18 Gender: Female Account #: [id number] Procedure:                Colonoscopy, With cold snare polypectomy x 2 Indications:              High risk colon cancer surveillance: Personal                            history of adenoma (10 mm or greater in size), High                            risk colon cancer surveillance: Personal history of                            adenoma with villous component. Previous                            examinations with Dr. Olevia Perches. Index examination                            2007 with polyp. Surveillance examination 2014 with                            microscopic colitis Medicines:                Monitored Anesthesia Care Procedure:                Pre-Anesthesia Assessment:                           - Prior to the procedure, a History and Physical                            was performed, and patient medications and                            allergies were reviewed. The patient's tolerance of                            previous anesthesia was also reviewed. The risks                            and benefits of the procedure and the sedation                            options and risks were discussed with the patient.                            All questions were answered, and informed consent                            was obtained. Prior Anticoagulants: The patient has  taken no previous anticoagulant or antiplatelet                            agents. ASA Grade Assessment: II - A patient with                            mild systemic disease. After reviewing the risks                            and benefits, the patient was deemed in                            satisfactory condition to undergo the procedure.                           After obtaining  informed consent, the colonoscope                            was passed under direct vision. Throughout the                            procedure, the patient's blood pressure, pulse, and                            oxygen saturations were monitored continuously. The                            Colonoscope was introduced through the anus and                            advanced to the the cecum, identified by                            appendiceal orifice and ileocecal valve. The                            ileocecal valve, appendiceal orifice, and rectum                            were photographed. The quality of the bowel                            preparation was excellent. The colonoscopy was                            performed without difficulty. The patient tolerated                            the procedure well. The bowel preparation used was                            SUPREP. Scope In: 8:47:52 AM Scope Out: 9:08:56 AM Scope Withdrawal Time: 0 hours 17 minutes 35 seconds  Total Procedure Duration: 0 hours 21 minutes 4  seconds  Findings:                 Two polyps were found in the transverse colon and                            cecum. The polyps were 2 to 5 mm in size. These                            polyps were removed with a cold snare. Resection                            and retrieval were complete.                           The exam was otherwise without abnormality on                            direct and retroflexion views. Complications:            No immediate complications. Estimated blood loss:                            None. Estimated Blood Loss:     Estimated blood loss: none. Impression:               - Two 2 to 5 mm polyps in the transverse colon and                            in the cecum, removed with a cold snare. Resected                            and retrieved.                           - The examination was otherwise normal on direct                             and retroflexion views. Recommendation:           - Repeat colonoscopy in 5 years for surveillance.                           - Patient has a contact number available for                            emergencies. The signs and symptoms of potential                            delayed complications were discussed with the                            patient. Return to normal activities tomorrow.                            Written discharge instructions were provided to the  patient.                           - Resume previous diet.                           - Continue present medications.                           - Await pathology results. Docia Chuck. Henrene Pastor, MD 02/10/2018 9:15:10 AM This report has been signed electronically.

## 2018-02-10 NOTE — Progress Notes (Signed)
Report to PACU, RN, vss, BBS= Clear.  

## 2018-02-10 NOTE — Patient Instructions (Signed)
YOU HAD AN ENDOSCOPIC PROCEDURE TODAY AT [facility]:   Refer to the procedure report that was given to you for any specific questions about what was found during the examination.  If the procedure report does not answer your questions, please call your gastroenterologist to clarify.  If you requested that your care partner not be given the details of your procedure findings, then the procedure report has been included in a sealed envelope for you to review at your convenience later.  YOU SHOULD EXPECT: Some feelings of bloating in the abdomen. Passage of more gas than usual.  Walking can help get rid of the air that was put into your GI tract during the procedure and reduce the bloating. If you had a lower endoscopy (such as a colonoscopy or flexible sigmoidoscopy) you may notice spotting of blood in your stool or on the toilet paper. If you underwent a bowel prep for your procedure, you may not have a normal bowel movement for a few days.  Please Note:  You might notice some irritation and congestion in your nose or some drainage.  This is from the oxygen used during your procedure.  There is no need for concern and it should clear up in a day or so.  SYMPTOMS TO REPORT IMMEDIATELY:   Following lower endoscopy (colonoscopy or flexible sigmoidoscopy):  Excessive amounts of blood in the stool  Significant tenderness or worsening of abdominal pains  Swelling of the abdomen that is new, acute  Fever of 100F or higher   Following upper endoscopy (EGD)  Vomiting of blood or coffee ground material  New chest pain or pain under the shoulder blades  Painful or persistently difficult swallowing  New shortness of breath  Fever of 100F or higher  Black, tarry-looking stools  For urgent or emergent issues, a gastroenterologist can be reached at any hour by calling [phone].   DIET:  We do recommend a small meal at first, but then you may proceed to your regular diet.  Drink  plenty of fluids but you should avoid alcoholic beverages for 24 hours.  ACTIVITY:  You should plan to take it easy for the rest of today and you should NOT DRIVE or use heavy machinery until tomorrow (because of the sedation medicines used during the test).    FOLLOW UP: Our staff will call the number listed on your records the next business day following your procedure to check on you and address any questions or concerns that you may have regarding the information given to you following your procedure. If we do not reach you, we will leave a message.  However, if you are feeling well and you are not experiencing any problems, there is no need to return our call.  We will assume that you have returned to your regular daily activities without incident.  If any biopsies were taken you will be contacted by phone or by letter within the next 1-3 weeks.  Please call us at [phone] if you have not heard about the biopsies in 3 weeks.    SIGNATURES/CONFIDENTIALITY: You and/or your care partner have signed paperwork which will be entered into your electronic medical record.  These signatures attest to the fact that that the information above on your After Visit Summary has been reviewed and is understood.  Full responsibility of the confidentiality of this discharge information lies with you and/or your care-partner.YOU HAD AN ENDOSCOPIC PROCEDURE TODAY AT [facility]:  Refer to the procedure report that was given to you for any specific questions about what was found during the examination.  If the procedure report does not answer your questions, please call your gastroenterologist to clarify.  If you requested that your care partner not be given the details of your procedure findings, then the procedure report has been included in a sealed envelope for you to review at your convenience later.  YOU SHOULD EXPECT: Some feelings of bloating in the abdomen. Passage of more gas than  usual.  Walking can help get rid of the air that was put into your GI tract during the procedure and reduce the bloating. If you had a lower endoscopy (such as a colonoscopy or flexible sigmoidoscopy) you may notice spotting of blood in your stool or on the toilet paper. If you underwent a bowel prep for your procedure, you may not have a normal bowel movement for a few days.  Please Note:  You might notice some irritation and congestion in your nose or some drainage.  This is from the oxygen used during your procedure.  There is no need for concern and it should clear up in a day or so.  SYMPTOMS TO REPORT IMMEDIATELY:   Following lower endoscopy (colonoscopy or flexible sigmoidoscopy):  Excessive amounts of blood in the stool  Significant tenderness or worsening of abdominal pains  Swelling of the abdomen that is new, acute  Fever of 100F or higher   For urgent or emergent issues, a gastroenterologist can be reached at any hour by calling [phone].   DIET:  We do recommend a small meal at first, but then you may proceed to your regular diet.  Drink plenty of fluids but you should avoid alcoholic beverages for 24 hours.  ACTIVITY:  You should plan to take it easy for the rest of today and you should NOT DRIVE or use heavy machinery until tomorrow (because of the sedation medicines used during the test).    FOLLOW UP: Our staff will call the number listed on your records the next business day following your procedure to check on you and address any questions or concerns that you may have regarding the information given to you following your procedure. If we do not reach you, we will leave a message.  However, if you are feeling well and you are not experiencing any problems, there is no need to return our call.  We will assume that you have returned to your regular daily activities without incident.  If any biopsies were taken you will be contacted by phone or by letter within the next  1-3 weeks.  Please call us at [phone] if you have not heard about the biopsies in 3 weeks.    SIGNATURES/CONFIDENTIALITY: You and/or your care partner have signed paperwork which will be entered into your electronic medical record.  These signatures attest to the fact that that the information above on your After Visit Summary has been reviewed and is understood.  Full responsibility of the confidentiality of this discharge information lies with you and/or your care-partner  Polyp information given.  Recall 5 years 2024.

## 2018-02-10 NOTE — Progress Notes (Signed)
Called to room to assist during endoscopic procedure.  Patient ID and intended procedure confirmed with present staff. Received instructions for my participation in the procedure from the performing physician.  

## 2018-02-11 ENCOUNTER — Telehealth: Payer: Self-pay

## 2018-02-11 NOTE — Telephone Encounter (Signed)
  Follow up Call-  Call back number 02/10/2018  Post procedure Call Back phone  # [phone]  Permission to leave phone message Yes  Some recent data might be hidden     Patient questions:  Do you have a fever, pain , or abdominal swelling? No. Pain Score  0 *  Have you tolerated food without any problems? Yes.    Have you been able to return to your normal activities? Yes.    Do you have any questions about your discharge instructions: Diet   No. Medications  No. Follow up visit  No.  Do you have questions or concerns about your Care? No.  Actions: * If pain score is 4 or above: No action needed, pain <4.

## 2018-02-13 ENCOUNTER — Encounter: Payer: Self-pay | Admitting: Internal Medicine

## 2018-02-15 NOTE — Progress Notes (Signed)
FOLLOW UP  Assessment:   Essential hypertension At goal, continue medications  Hyperthyroidism      Monitor TSH levels - recently within normal range  -     TSH  Type 2 diabetes mellitus with diabetic nephropathy, without long-term current use of insulin ([place]) Discussed general issues about diabetes pathophysiology and management., Educational material distributed., Suggested low cholesterol diet., Encouraged aerobic exercise., Discussed foot care., Reminded to get yearly retinal exam- most recent exam report available and extracted. -     Hemoglobin A1c  CKD stage 3 due to type 2 diabetes mellitus (HCC) Increase fluids, avoid NSAIDS, monitor sugars, will monitor -     BASIC METABOLIC PANEL WITH GFR  Medication management -     CBC with Differential/Platelet -     BASIC METABOLIC PANEL WITH GFR -     Hepatic function panel  Mixed hyperlipidemia Continue medications: crestor 20 mg daily Continue low cholesterol diet and exercise.  -     Lipid panel -     TSH  Vitamin D deficiency Near goal at recent check; continue to recommend supplementation for goal of 70-100 Defer vitamin D level  Depression/anxiety Stop celexa, start xanax PRN and zoloft at night Continue CPAP Start back on vitamind  Over 30 minutes of exam, counseling, chart review and critical decision making was performed Future Appointments  Date Time Provider [place]  04/09/2018 10:00 AM Ward Givens, NP [facility] None  05/25/2018  9:45 AM [name], MD [facility] None  12/01/2018 10:00 AM [name], MD [facility] None     Subjective:  Michele [name] is a 72 y.o. female who presents for 3 month follow up.   BMI is Body mass index is 26.4 kg/m., she has not been working on diet and exercise. Wt Readings from Last 3 Encounters:  02/18/18 151 lb 6.4 oz (68.7 kg)  02/10/18 154 lb (69.9 kg)  01/27/18 151 lb 6.4 oz (68.7 kg)    Her blood pressure has been controlled at home today  their BP is BP: 126/84 She does workout (works on a farm) She denies chest pain, shortness of breath, but does endorse intermittent/sudden onset fatigue/ weakness, reports energy levels have been very low. This is ongoing for several months and she has had a very through work up. She feels it may be anxiety, she does not want to go out and do things. She has trouble sleeping. She feels the celexa is not helping.   She is on cholesterol medication and denies myalgias. Her total and LDL cholesterol is at goal, trigs remained somewhat elevated. The cholesterol last visit was:   Lab Results  Component Value Date   CHOL 134 11/06/2017   HDL 44 (L) 11/06/2017   LDLCALC 67 11/06/2017   TRIG 152 (H) 11/06/2017   CHOLHDL 3.0 11/06/2017    She has not been working on diet and exercise for T2 diabetes, well controlled with metformin with recent A1C in prediabetic range, and denies increased appetite, nausea, paresthesia of the feet, polydipsia, polyuria, visual disturbances and vomiting. Last A1C in the office was:  Lab Results  Component Value Date   HGBA1C 5.3 11/06/2017   Last GFR: Lab Results  Component Value Date   GFRNONAA 82 (L) 11/06/2017   Patient is on Vitamin D supplement and was near goal of 70 at last check:    Lab Results  Component Value Date   VD25OH 62 11/06/2017     She has a documented history of hyperthyroidism.  She is not currently on medications for thyroid, last TSH was within normal range:    Lab Results  Component Value Date   TSH 0.91 11/06/2017   Patient is not on allopurinol for gout and does not report a recent flare.  Lab Results  Component Value Date   LABURIC 5.9 11/06/2017     Medication Review: Current Outpatient Medications on File Prior to Visit  Medication Sig Dispense Refill  . APPLE CIDER VINEGAR PO Take by mouth.    Marland Kitchen aspirin 325 MG EC tablet Take 325 mg by mouth daily.    . Biotin 1000 MCG tablet Take 1,000 mcg by mouth daily. Reported on  08/22/2015    . Blood Glucose Monitoring Suppl (ONE TOUCH ULTRA 2) w/Device KIT Check blood sugar 1 time daily-E11.22 1 each 0  . Cholecalciferol (VITAMIN D PO) Take 10,000 Units by mouth daily.     Marland Kitchen CINNAMON PO Take by mouth.    . furosemide (LASIX) 40 MG tablet Take 1 tablet (40 mg total) daily by mouth. 90 tablet 99  . glucose blood (ONE TOUCH ULTRA TEST) test strip chec blood sugar 1 time daily-DX-E11.22 100 each 4  . Iron TABS Take 325 mg by mouth 2 (two) times daily.     . Lancets (ONETOUCH ULTRASOFT) lancets Check blood sugar 1 time daily-E11.22 100 each 4  . Magnesium 500 MG TABS Take 1 tablet by mouth 3 (three) times daily.    . magnesium hydroxide (MILK OF MAGNESIA) 400 MG/5ML suspension Take by mouth daily as needed for mild constipation.    . metFORMIN (GLUCOPHAGE-XR) 500 MG 24 hr tablet Take 1 tablet (500 mg total) by mouth 3 (three) times daily. 270 tablet 3  . minoxidil (LONITEN) 10 MG tablet TAKE 1/2TABLET BY MOUTH AT SUPPERTIME 90 tablet 1  . Omega-3 Fatty Acids (FISH OIL) 1000 MG CAPS Take 1,000 mg by mouth daily. Reported on 08/22/2015    . OVER THE COUNTER MEDICATION Takes Hemp oil or capsule    . OVER THE COUNTER MEDICATION Beet root    . polyethylene glycol (MIRALAX / GLYCOLAX) packet Take 17 g by mouth daily.    . rosuvastatin (CRESTOR) 20 MG tablet Take 1/2 tablet three times a week 90 tablet 1  . senna (SENOKOT) 8.6 MG tablet Take 1 tablet by mouth daily.    Marland Kitchen telmisartan (MICARDIS) 80 MG tablet TAKE 1 TABLET BY MOUTH ONCE DAILY FOR BLOOD PRESSURE 90 tablet 1  . terbinafine (LAMISIL) 250 MG tablet Take 1 tablet daily for toenail fungus 90 tablet 0  . verapamil (CALAN-SR) 240 MG CR tablet Take 1 tablet (240 mg total) by mouth at bedtime. 30 tablet 3   No current facility-administered medications on file prior to visit.     Allergies  Allergen Reactions  . Lipitor [Atorvastatin]     Elevates LFT's    Current Problems (verified) Patient Active Problem List    Diagnosis Date Noted  . Vitiligo 11/09/2017  . Encounter for Medicare annual wellness exam 07/24/2017  . Type 2 diabetes mellitus ([place]) 07/24/2017  . Overweight (BMI 25.0-29.9) 07/24/2017  . Cerebrovascular disease 01/02/2017  . Myalgia and myositis 02/16/2016  . Hyperthyroidism 09/11/2015  . Gout 09/22/2014  . Vitamin D deficiency 06/28/2013  . Medication management 06/28/2013  . Anemia 06/28/2013  . CKD stage 3 due to type 2 diabetes mellitus ([place])   . Mixed hyperlipidemia 03/21/2008  . Essential hypertension 03/21/2008  . GERD 03/21/2008  . Fatty liver 03/21/2008  .  COLONIC POLYPS, ADENOMATOUS, HX OF 03/21/2008    Review of Systems  Constitutional: Positive for malaise/fatigue.  HENT: Negative.   Eyes: Negative.   Respiratory: Negative.   Cardiovascular: Negative.   Gastrointestinal: Positive for constipation.  Genitourinary: Negative.   Musculoskeletal: Negative.   Skin: Negative.   Neurological: Positive for tremors.    Objective:     Today's Vitals   02/18/18 1059  BP: 126/84  Pulse: 79  Temp: 98.1 F (36.7 C)  SpO2: 97%  Weight: 151 lb 6.4 oz (68.7 kg)  Height: 5' 3.5" (1.613 m)   Body mass index is 26.4 kg/m.  General appearance: alert, no distress, WD/WN, female HEENT: normocephalic, sclerae anicteric, TMs pearly, nares patent, no discharge or erythema, pharynx normal Oral cavity: MMM, no lesions Neck: supple, no lymphadenopathy, no thyromegaly, no masses Heart: RRR, normal S1, S2, no murmurs Lungs: CTA bilaterally, no wheezes, rhonchi, or rales Abdomen: +bs, soft, non tender, non distended, no masses, no hepatomegaly, no splenomegaly Musculoskeletal: nontender, no swelling, no obvious deformity Extremities: no edema, no cyanosis, no clubbing Pulses: 2+ symmetric, upper and lower extremities, normal cap refill Neurological: alert, oriented x 3, CN2-12 intact, strength normal upper extremities and lower extremities, sensation normal throughout, DTRs  2+ throughout, no cerebellar signs, gait normal Psychiatric: depressed affect, behavior normal, pleasant    Vicie Mutters, PA-C   02/18/2018

## 2018-02-18 ENCOUNTER — Ambulatory Visit (INDEPENDENT_AMBULATORY_CARE_PROVIDER_SITE_OTHER): Payer: PPO | Admitting: Physician Assistant

## 2018-02-18 ENCOUNTER — Encounter: Payer: Self-pay | Admitting: Physician Assistant

## 2018-02-18 VITALS — BP 126/84 | HR 79 | Temp 98.1°F | Ht 63.5 in | Wt 151.4 lb

## 2018-02-18 DIAGNOSIS — E1122 Type 2 diabetes mellitus with diabetic chronic kidney disease: Secondary | ICD-10-CM | POA: Diagnosis not present

## 2018-02-18 DIAGNOSIS — I679 Cerebrovascular disease, unspecified: Secondary | ICD-10-CM | POA: Diagnosis not present

## 2018-02-18 DIAGNOSIS — F3341 Major depressive disorder, recurrent, in partial remission: Secondary | ICD-10-CM

## 2018-02-18 DIAGNOSIS — E782 Mixed hyperlipidemia: Secondary | ICD-10-CM | POA: Diagnosis not present

## 2018-02-18 DIAGNOSIS — Z79899 Other long term (current) drug therapy: Secondary | ICD-10-CM

## 2018-02-18 DIAGNOSIS — N183 Chronic kidney disease, stage 3 (moderate): Secondary | ICD-10-CM | POA: Diagnosis not present

## 2018-02-18 DIAGNOSIS — Z23 Encounter for immunization: Secondary | ICD-10-CM | POA: Diagnosis not present

## 2018-02-18 DIAGNOSIS — E559 Vitamin D deficiency, unspecified: Secondary | ICD-10-CM

## 2018-02-18 DIAGNOSIS — I10 Essential (primary) hypertension: Secondary | ICD-10-CM

## 2018-02-18 DIAGNOSIS — E0590 Thyrotoxicosis, unspecified without thyrotoxic crisis or storm: Secondary | ICD-10-CM | POA: Diagnosis not present

## 2018-02-18 DIAGNOSIS — E1121 Type 2 diabetes mellitus with diabetic nephropathy: Secondary | ICD-10-CM | POA: Diagnosis not present

## 2018-02-18 MED ORDER — ALPRAZOLAM 0.5 MG PO TABS: 0.5000 mg | ORAL_TABLET | Freq: Two times a day (BID) | ORAL | 0 refills | Status: DC | PRN

## 2018-02-18 MED ORDER — VERAPAMIL HCL ER 240 MG PO TBCR: 240.0000 mg | EXTENDED_RELEASE_TABLET | Freq: Every day | ORAL | 3 refills | Status: DC

## 2018-02-18 MED ORDER — SERTRALINE HCL 50 MG PO TABS: 50.0000 mg | ORAL_TABLET | Freq: Every day | ORAL | 2 refills | Status: DC

## 2018-02-18 NOTE — Patient Instructions (Addendum)
Stop the citalopram and start on zoloft/sertaline at night for anxiety and sleep  Start back on magnesium 500mg  1-2 a day at night for sleep and constipation  Take the miralax daily for constipation- take the milk of magnesium and senokot AS NEEDED  Being dehydrated can hurt your kidneys, cause fatigue, headaches, muscle aches, joint pain, and dry skin/nails so please increase your fluids.   Drink 80-100 oz a day of water, measure it out! Eat 3 meals a day, have to do breakfast, eat protein- hard boiled eggs, protein bar like nature valley protein bar, greek yogurt like oikos triple zero, chobani 100, or light n fit greek  Can check out plantnanny app on your phone to help you keep track of your water  Can take the xanax 0.5mg  1/2-1 pill AS NEEDED twice a day- MAY NEED 1 PILL AT NIGHT FOR SLEEP  GET BACK ON THE VITAMIN D AS WELL   Counseling services  Here are some numbers below you can try but I suggest calling your insurance and finding out who is in your network and THEN calling those people or looking them up on google.   I'm a big fan of Cognitive Behavioral Therapy, look this up on You tube or check with the therapist you see if they are certified.  This form of therapy helps to teach you skills to better handle with current situation that are causing anxiety or depression.      Living With Anxiety After being diagnosed with an anxiety disorder, you may be relieved to know why you have felt or behaved a certain way. It is natural to also feel overwhelmed about the treatment ahead and what it will mean for your life. With care and support, you can manage this condition and recover from it. How to cope with anxiety Dealing with stress Stress is your body's reaction to life changes and events, both good and bad. Stress can last just a few hours or it can be ongoing. Stress can play a major role in anxiety, so it is important to learn both how to cope with stress and how to think  about it differently. Talk with your health care provider or a counselor to learn more about stress reduction. He or she may suggest some stress reduction techniques, such as:  Music therapy. This can include creating or listening to music that you enjoy and that inspires you.  Mindfulness-based meditation. This involves being aware of your normal breaths, rather than trying to control your breathing. It can be done while sitting or walking.  Centering prayer. This is a kind of meditation that involves focusing on a word, phrase, or sacred image that is meaningful to you and that brings you peace.  Deep breathing. To do this, expand your stomach and inhale slowly through your nose. Hold your breath for 3-5 seconds. Then exhale slowly, allowing your stomach muscles to relax.  Self-talk. This is a skill where you identify thought patterns that lead to anxiety reactions and correct those thoughts.  Muscle relaxation. This involves tensing muscles then relaxing them.  Choose a stress reduction technique that fits your lifestyle and personality. Stress reduction techniques take time and practice. Set aside 5-15 minutes a day to do them. Therapists can offer training in these techniques. The training may be covered by some insurance plans. Other things you can do to manage stress include:  Keeping a stress diary. This can help you learn what triggers your stress and ways to control  your response.  Thinking about how you respond to certain situations. You may not be able to control everything, but you can control your reaction.  Making time for activities that help you relax, and not feeling guilty about spending your time in this way.  Therapy combined with coping and stress-reduction skills provides the best chance for successful treatment. Medicines Medicines can help ease symptoms. Medicines for anxiety include:  Anti-anxiety drugs.  Antidepressants.  Beta-blockers.  Medicines may be  used as the main treatment for anxiety disorder, along with therapy, or if other treatments are not working. Medicines should be prescribed by a health care provider. Relationships Relationships can play a big part in helping you recover. Try to spend more time connecting with trusted friends and family members. Consider going to couples counseling, taking family education classes, or going to family therapy. Therapy can help you and others better understand the condition. How to recognize changes in your condition Everyone has a different response to treatment for anxiety. Recovery from anxiety happens when symptoms decrease and stop interfering with your daily activities at home or work. This may mean that you will start to:  Have better concentration and focus.  Sleep better.  Be less irritable.  Have more energy.  Have improved memory.  It is important to recognize when your condition is getting worse. Contact your health care provider if your symptoms interfere with home or work and you do not feel like your condition is improving. Where to find help and support: You can get help and support from these sources:  Self-help groups.  Online and [employer].  A trusted spiritual leader.  Couples counseling.  Family education classes.  Family therapy.  Follow these instructions at home:  Eat a healthy diet that includes plenty of vegetables, fruits, whole grains, low-fat dairy products, and lean protein. Do not eat a lot of foods that are high in solid fats, added sugars, or salt.  Exercise. Most adults should do the following: ? Exercise for at least 150 minutes each week. The exercise should increase your heart rate and make you sweat (moderate-intensity exercise). ? Strengthening exercises at least twice a week.  Cut down on caffeine, tobacco, alcohol, and other potentially harmful substances.  Get the right amount and quality of sleep. Most adults need 7-9  hours of sleep each night.  Make choices that simplify your life.  Take over-the-counter and prescription medicines only as told by your health care provider.  Avoid caffeine, alcohol, and certain over-the-counter cold medicines. These may make you feel worse. Ask your pharmacist which medicines to avoid.  Keep all follow-up visits as told by your health care provider. This is important. Questions to ask your health care provider  Would I benefit from therapy?  How often should I follow up with a health care provider?  How long do I need to take medicine?  Are there any long-term side effects of my medicine?  Are there any alternatives to taking medicine? Contact a health care provider if:  You have a hard time staying focused or finishing daily tasks.  You spend many hours a day feeling worried about everyday life.  You become exhausted by worry.  You start to have headaches, feel tense, or have nausea.  You urinate more than normal.  You have diarrhea. Get help right away if:  You have a racing heart and shortness of breath.  You have thoughts of hurting yourself or others. If you ever feel like you  may hurt yourself or others, or have thoughts about taking your own life, get help right away. You can go to your nearest emergency department or call:  Your local emergency services (911 in the [place]  A suicide crisis helpline, such as the [place] at (413) 882-5633. This is open 24-hours a day.  Summary  Taking steps to deal with stress can help calm you.  Medicines cannot cure anxiety disorders, but they can help ease symptoms.  Family, friends, and partners can play a big part in helping you recover from an anxiety disorder. This information is not intended to replace advice given to you by your health care provider. Make sure you discuss any questions you have with your health care provider. Document Released: 05/14/2016 Document  Revised: 05/14/2016 Document Reviewed: 05/14/2016 Elsevier Interactive Patient Education  [employer].

## 2018-02-19 LAB — COMPLETE METABOLIC PANEL WITH GFR
AG Ratio: 1.8 (calc) (ref 1.0–2.5)
ALBUMIN MSPROF: 4.1 g/dL (ref 3.6–5.1)
ALKALINE PHOSPHATASE (APISO): 88 U/L (ref 33–130)
ALT: 24 U/L (ref 6–29)
AST: 25 U/L (ref 10–35)
BUN: 8 mg/dL (ref 7–25)
CALCIUM: 9.8 mg/dL (ref 8.6–10.4)
CO2: 24 mmol/L (ref 20–32)
CREATININE: 0.82 mg/dL (ref 0.60–0.93)
Chloride: 105 mmol/L (ref 98–110)
GFR, EST NON AFRICAN AMERICAN: 71 mL/min/{1.73_m2} (ref >=60)
GFR, Est African American: 83 mL/min/{1.73_m2} (ref >=60)
GLOBULIN: 2.3 g/dL (ref 1.9–3.7)
Glucose, Bld: 77 mg/dL (ref 65–99)
Potassium: 4.2 mmol/L (ref 3.5–5.3)
SODIUM: 142 mmol/L (ref 135–146)
Total Bilirubin: 0.9 mg/dL (ref 0.2–1.2)
Total Protein: 6.4 g/dL (ref 6.1–8.1)

## 2018-02-19 LAB — CBC WITH DIFFERENTIAL/PLATELET
BASOS ABS: 29 {cells}/uL (ref 0–200)
Basophils Relative: 0.7 %
Eosinophils Absolute: 202 cells/uL (ref 15–500)
Eosinophils Relative: 4.8 %
HEMATOCRIT: 37.7 % (ref 35.0–45.0)
HEMOGLOBIN: 12.4 g/dL (ref 11.7–15.5)
LYMPHS ABS: 1268 {cells}/uL (ref 850–3900)
MCH: 27.3 pg (ref 27.0–33.0)
MCHC: 32.9 g/dL (ref 32.0–36.0)
MCV: 82.9 fL (ref 80.0–100.0)
MPV: 9.1 fL (ref 7.5–12.5)
Monocytes Relative: 13.3 %
NEUTROS ABS: 2142 {cells}/uL (ref 1500–7800)
Neutrophils Relative %: 51 %
Platelets: 215 10*3/uL (ref 140–400)
RBC: 4.55 10*6/uL (ref 3.80–5.10)
RDW: 13.8 % (ref 11.0–15.0)
Total Lymphocyte: 30.2 %
WBC: 4.2 10*3/uL (ref 3.8–10.8)
WBCMIX: 559 {cells}/uL (ref 200–950)

## 2018-02-19 LAB — MAGNESIUM: MAGNESIUM: 1.7 mg/dL (ref 1.5–2.5)

## 2018-02-19 LAB — LIPID PANEL
CHOL/HDL RATIO: 3.6 (calc) (ref <5.0)
Cholesterol: 157 mg/dL (ref <200)
HDL: 44 mg/dL — ABNORMAL LOW (ref >50)
LDL Cholesterol (Calc): 84 mg/dL (calc)
Non-HDL Cholesterol (Calc): 113 mg/dL (calc) (ref <130)
Triglycerides: 203 mg/dL — ABNORMAL HIGH (ref <150)

## 2018-02-19 LAB — HEMOGLOBIN A1C
HEMOGLOBIN A1C: 5.4 %{Hb} (ref <5.7)
Mean Plasma Glucose: 108 (calc)
eAG (mmol/L): 6 (calc)

## 2018-02-19 LAB — VITAMIN D 25 HYDROXY (VIT D DEFICIENCY, FRACTURES): VIT D 25 HYDROXY: 56 ng/mL (ref 30–100)

## 2018-02-19 LAB — TSH: TSH: 0.62 mIU/L (ref 0.40–4.50)

## 2018-03-17 IMAGING — NM NM MISC PROCEDURE
6 series · 36 of 36 positions shown · non-contrast
Comparison: none

[Series 1: wbr_r-proj_st rest · 6.51mm/px · 6 of 64 frames shown]
[frame 6/64]
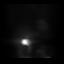
[frame 16/64]
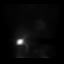
[frame 27/64]
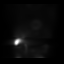
[frame 38/64]
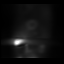
[frame 48/64]
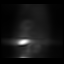
[frame 59/64]
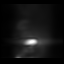

[Series 1: rest · 6.51mm/px · 6 of 64 frames shown]
[frame 6/64]
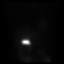
[frame 16/64]
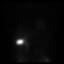
[frame 27/64]
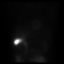
[frame 38/64]
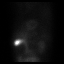
[frame 48/64]
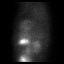
[frame 59/64]
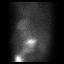

[Series 2: stress · 6.51mm/px · 6 of 512 frames shown (1 of 2)]
[frame 43/512]
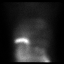
[frame 128/512]
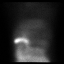
[frame 214/512]
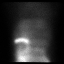
[frame 299/512]
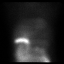
[frame 384/512]
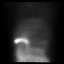
[frame 470/512]
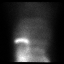

[Series 2: stress · 6.51mm/px · 6 of 64 frames shown (2 of 2)]
[frame 6/64]
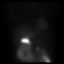
[frame 16/64]
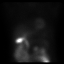
[frame 27/64]
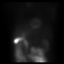
[frame 38/64]
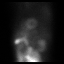
[frame 48/64]
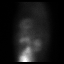
[frame 59/64]
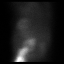

[Series 2: wbr_s-proj_st stress · 6.51mm/px · 6 of 512 frames shown (1 of 2)]
[frame 43/512]
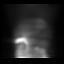
[frame 128/512]
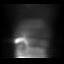
[frame 214/512]
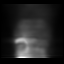
[frame 299/512]
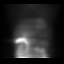
[frame 384/512]
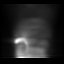
[frame 470/512]
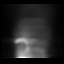

[Series 2: wbr_s-proj_st stress · 6.51mm/px · 6 of 64 frames shown (2 of 2)]
[frame 6/64]
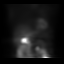
[frame 16/64]
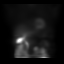
[frame 27/64]
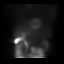
[frame 38/64]
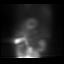
[frame 48/64]
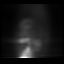
[frame 59/64]
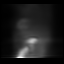

[36 of 36 positions shown; findings below may reference images not displayed]

Canned report from images found in remote index.

Refer to host system for actual result text.

## 2018-04-07 ENCOUNTER — Encounter: Payer: Self-pay | Admitting: Adult Health

## 2018-04-08 DIAGNOSIS — G4733 Obstructive sleep apnea (adult) (pediatric): Secondary | ICD-10-CM | POA: Diagnosis not present

## 2018-04-09 ENCOUNTER — Ambulatory Visit (INDEPENDENT_AMBULATORY_CARE_PROVIDER_SITE_OTHER): Payer: PPO | Admitting: Adult Health

## 2018-04-09 ENCOUNTER — Encounter: Payer: Self-pay | Admitting: Adult Health

## 2018-04-09 VITALS — BP 123/69 | HR 80 | Ht 63.0 in | Wt 154.2 lb

## 2018-04-09 DIAGNOSIS — Z9989 Dependence on other enabling machines and devices: Secondary | ICD-10-CM | POA: Diagnosis not present

## 2018-04-09 DIAGNOSIS — G4733 Obstructive sleep apnea (adult) (pediatric): Secondary | ICD-10-CM

## 2018-04-09 NOTE — Progress Notes (Signed)
PATIENT: Michele [name] DOB: 23-Dec-1945  REASON FOR VISIT: follow up HISTORY FROM: patient  HISTORY OF PRESENT ILLNESS: Today 04/09/18:  Michele [name] is a 72 year old female with a history of obstructive sleep apnea on CPAP.  Her CPAP download indicates that she use her machine 29 out of 30 days for compliance of 97%.  She used her machine greater than 4 hours 28 days for compliance of 93%.  On average she uses her machine 6 hours and 13 minutes.  Her residual AHI is 2.4 on 5 to 10 cm of water with EPR of 3.  She does not have a significant leak.  She reports that she is not sure that she likes that heated air.  She reports that she has been taking.  She returns today for follow-up.  HISTORY Michele [name] is a 72 y.o. female , seen here as in referral  from Dr. Melford Aase for a sleep evaluation,  Chief complaint according to patient : " I am needing a nap in daytime,and I am always sleepy and tired".   Michele [name] had been evaluated for neurovascular disease by my colleague Dr. Jannifer Franklin to whom [name] also referred for further work-up of gait disorder and balance problems.   She also initiated a sleep work-up as the patient has felt tired excessively sleepy in daytime for the last 2 or 3 years, correlated with her admission to hospital.  At the time she had been suffering from very high blood pressures malignant hypertension and she has not felt ever the same.  She did not need daytime naps before.  She also has chronic kidney disease stage III due to diabetes mellitus and hypertension, chronic disease anemia, CVA-diffuse cerebrovascular disease presenting with dizziness and bradycardia. She has such bradycardia that her ophthalmologist refused a cataract surgery.  She had her last heart rate at Dr. Melford Aase office measured at 37 bpm and erratic, she has been on atenolol 25 mg twice daily, clonidine 0.1 twice daily which was stopped in February and lisinopril as well as minoxidil  and verapamil- and just added Telmisartan with Dr. Debara Pickett, MD. He removed clonidin and atenolol. Her  blood pressure readings at home have been in normal rate the lowest was 89/44 the lowest heart rate at home was 42 and it is very likely that her blood pressure medicines contribute to her sleepiness.   Sleep habits are as follows:  She watches TV on the couch , and always falls asleep there - the Tv is watching her. She feels she needs the rest on the couch to allow her to go to sleep (?) .  She retreats to the bedroom at about 1-2 AM after a bathroom call.  She describes the marital bedroom is cool, quiet and dark-she sleeps on her right side, on just one pillow for head support.  She reports dreaming frequently and vividly but not of nightmarish character.  She does not have further bouts from calls after her initial one.  She does have some lower back pain, sometimes her legs ache mainly the thighs and the knee, but this rarely wakes her up from sleep. She wakes spontaneously whenever she wakes, and that she has a morning appointment.  She wakes usually up spontaneously around 730 unless she has to be somewhere earlier. All together she gets 7 hours of nocturnal sleep.      REVIEW OF SYSTEMS: Out of a complete 14 system review of symptoms, the patient complains only of  the following symptoms, and all other reviewed systems are negative.  Constipation, ear pain ESS 9 ALLERGIES: Allergies  Allergen Reactions  . Lipitor [Atorvastatin]     Elevates LFT's    HOME MEDICATIONS: Outpatient Medications Prior to Visit  Medication Sig Dispense Refill  . ALPRAZolam (XANAX) 0.5 MG tablet Take 1 tablet (0.5 mg total) by mouth 2 (two) times daily as needed for anxiety or sleep. 60 tablet 0  . aspirin 325 MG EC tablet Take 325 mg by mouth daily.    . Blood Glucose Monitoring Suppl (ONE TOUCH ULTRA 2) w/Device KIT Check blood sugar 1 time daily-E11.22 1 each 0  . Cholecalciferol (VITAMIN D PO) Take  10,000 Units by mouth daily.     . furosemide (LASIX) 40 MG tablet Take 1 tablet (40 mg total) daily by mouth. 90 tablet 99  . glucose blood (ONE TOUCH ULTRA TEST) test strip chec blood sugar 1 time daily-DX-E11.22 100 each 4  . Lancets (ONETOUCH ULTRASOFT) lancets Check blood sugar 1 time daily-E11.22 100 each 4  . Magnesium 500 MG TABS Take 1 tablet by mouth 3 (three) times daily.    . magnesium hydroxide (MILK OF MAGNESIA) 400 MG/5ML suspension Take by mouth daily as needed for mild constipation.    . metFORMIN (GLUCOPHAGE-XR) 500 MG 24 hr tablet Take 1 tablet (500 mg total) by mouth 3 (three) times daily. 270 tablet 3  . minoxidil (LONITEN) 10 MG tablet TAKE 1/2TABLET BY MOUTH AT SUPPERTIME 90 tablet 1  . polyethylene glycol (MIRALAX / GLYCOLAX) packet Take 17 g by mouth daily.    . rosuvastatin (CRESTOR) 20 MG tablet Take 1/2 tablet three times a week 90 tablet 1  . senna (SENOKOT) 8.6 MG tablet Take 1 tablet by mouth daily.    . sertraline (ZOLOFT) 50 MG tablet Take 1 tablet (50 mg total) by mouth at bedtime. 30 tablet 2  . telmisartan (MICARDIS) 80 MG tablet TAKE 1 TABLET BY MOUTH ONCE DAILY FOR BLOOD PRESSURE 90 tablet 1  . terbinafine (LAMISIL) 250 MG tablet Take 1 tablet daily for toenail fungus 90 tablet 0  . verapamil (CALAN-SR) 240 MG CR tablet Take 1 tablet (240 mg total) by mouth at bedtime. 90 tablet 3   No facility-administered medications prior to visit.     PAST MEDICAL HISTORY: Past Medical History:  Diagnosis Date  . Anemia   . Anxiety   . Arthritis   . Carpal tunnel syndrome   . Cerebrovascular disease 01/02/2017  . Duodenal diverticulum   . Esophageal motility disorder   . Fatty liver   . GERD (gastroesophageal reflux disease)   . Hyperlipidemia   . Hypertension   . 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 Sleep apnea   . Tubulovillous adenoma of colon   . Type II or unspecified type diabetes mellitus without mention of complication, not stated as uncontrolled      PAST SURGICAL HISTORY: Past Surgical History:  Procedure Laterality Date  . APPENDECTOMY    . BREAST BIOPSY Right   . CARPAL TUNNEL RELEASE Right 1992  . cataract extration    . COLONOSCOPY    . CYST REMOVAL HAND    . TONSILLECTOMY  1952    FAMILY HISTORY: Family History  Problem Relation Age of Onset  . Heart disease Mother   . Diabetes Mother   . Colon polyps Mother   . Heart disease Father   . Diabetes Father   . Heart attack Father   . Heart disease Brother   .  Heart attack Brother   . Cerebral aneurysm Sister   . Heart disease Brother        Stent  . Colon cancer Neg Hx   . Esophageal cancer Neg Hx   . Stomach cancer Neg Hx   . Rectal cancer Neg Hx     SOCIAL HISTORY: Social History   Socioeconomic History  . Marital status: Married    Spouse name: Trilby Drummer  . Number of children: 1  . Years of education: 50  . Highest education level: Not on file  Occupational History  . Occupation: Retired    Fish farm manager: [employer]  Social Needs  . Financial resource strain: Not on file  . Food insecurity:    Worry: Not on file    Inability: Not on file  . Transportation needs:    Medical: Not on file    Non-medical: Not on file  Tobacco Use  . Smoking status: Never Smoker  . Smokeless tobacco: Never Used  Substance and Sexual Activity  . Alcohol use: No  . Drug use: No  . Sexual activity: Never    Partners: Male    Comment: Married  Lifestyle  . Physical activity:    Days per week: Not on file    Minutes per session: Not on file  . Stress: Not on file  Relationships  . Social connections:    Talks on phone: Not on file    Gets together: Not on file    Attends religious service: Not on file    Active member of club or organization: Not on file    Attends meetings of clubs or organizations: Not on file    Relationship status: Not on file  . Intimate partner violence:    Fear of current or ex partner: Not on file    Emotionally abused: Not on file     Physically abused: Not on file    Forced sexual activity: Not on file  Other Topics Concern  . Not on file  Social History Narrative   Lives with husband.  Independent of ADLs. Retired but stays active with [employer] part time.   Right-handed   Caffeine: about 1 cup of coffee per day, tea and Cokes a few times per week      PHYSICAL EXAM  Vitals:   04/09/18 0948  BP: 123/69  Pulse: 80  Weight: 154 lb 3.2 oz (69.9 kg)  Height: _0  (1.6 m)   Body mass index is 27.32 kg/m.  Generalized: Well developed, in no acute distress  HEENT: TMI bilaterally, ear canals clear bilaterally.  Neurological examination  Mentation: Alert oriented to time, place, history taking. Follows all commands speech and language fluent Cranial nerve II-XII: . Extraocular movements were full, visual field were full on confrontational test. Facial sensation and strength were normal. Uvula tongue midline. Head turning and shoulder shrug  were normal and symmetric.  Neck circumference, Mallampati 3+ Motor: The motor testing reveals 5 over 5 strength of all 4 extremities. Good symmetric motor tone is noted throughout.  Sensory: Sensory testing is intact to soft touch on all 4 extremities. No evidence of extinction is noted.  Coordination: Cerebellar testing reveals good finger-nose-finger and heel-to-shin bilaterally.  Gait and station: Gait is normal. Reflexes: Deep tendon reflexes are symmetric and normal bilaterally.   DIAGNOSTIC DATA (LABS, IMAGING, TESTING) - I reviewed patient records, labs, notes, testing and imaging myself where available.  Lab Results  Component Value Date   WBC 4.2 02/18/2018  HGB 12.4 02/18/2018   HCT 37.7 02/18/2018   MCV 82.9 02/18/2018   PLT 215 02/18/2018      Component Value Date/Time   NA 142 02/18/2018 1139   K 4.2 02/18/2018 1139   CL 105 02/18/2018 1139   CO2 24 02/18/2018 1139   GLUCOSE 77 02/18/2018 1139   BUN 8 02/18/2018 1139   CREATININE  0.82 02/18/2018 1139   CALCIUM 9.8 02/18/2018 1139   PROT 6.4 02/18/2018 1139   ALBUMIN 4.6 01/09/2017 1730   AST 25 02/18/2018 1139   ALT 24 02/18/2018 1139   ALKPHOS 66 01/09/2017 1730   BILITOT 0.9 02/18/2018 1139   GFRNONAA 71 02/18/2018 1139   GFRAA 83 02/18/2018 1139   Lab Results  Component Value Date   CHOL 157 02/18/2018   HDL 44 (L) 02/18/2018   LDLCALC 84 02/18/2018   TRIG 203 (H) 02/18/2018   CHOLHDL 3.6 02/18/2018   Lab Results  Component Value Date   HGBA1C 5.4 02/18/2018   Lab Results  Component Value Date   VITAMINB12 721 02/16/2016   Lab Results  Component Value Date   TSH 0.62 02/18/2018      ASSESSMENT AND PLAN 72 y.o. year old female  has a past medical history of Anemia, Anxiety, Arthritis, Carpal tunnel syndrome, Cerebrovascular disease (01/02/2017), Duodenal diverticulum, Esophageal motility disorder, Fatty liver, GERD (gastroesophageal reflux disease), Hyperlipidemia, Hypertension, Lymphocytic colitis (09/22/12), Sleep apnea, Tubulovillous adenoma of colon, and Type II or unspecified type diabetes mellitus without mention of complication, not stated as uncontrolled. here with:  1.  Obstructive sleep apnea on CPAP  The patient CPAP download shows excellent compliance and good treatment of her apnea.  She is encouraged to continue to the CPAP nightly.  Advised that if she does not like the heating tubing she can request her DME to send regular tubing.  We also adjust her humidification settings today.  She is advised that if her symptoms worsen or she develops new symptoms she should let us know.  She will follow-up in 6 months or sooner if needed.   I spent 15 minutes with the patient. 50% of this time was spent reviewing CPAP download   Ward Givens, MSN, NP-C 04/09/2018, 10:08 AM [facility] [address], [place], [place] [address] [phone]

## 2018-04-09 NOTE — Patient Instructions (Signed)
Your Plan:  Continue using CPAP nightly and >4 hours each night If your symptoms worsen or you develop new symptoms please let us know.   Thank you for coming to see us at [facility]. I hope we have been able to provide you high quality care today.  You may receive a patient satisfaction survey over the next few weeks. We would appreciate your feedback and comments so that we may continue to improve ourselves and the health of our patients.  

## 2018-04-11 ENCOUNTER — Other Ambulatory Visit: Payer: Self-pay | Admitting: Physician Assistant

## 2018-04-28 DIAGNOSIS — H1851 Endothelial corneal dystrophy: Secondary | ICD-10-CM | POA: Diagnosis not present

## 2018-05-08 DIAGNOSIS — G4733 Obstructive sleep apnea (adult) (pediatric): Secondary | ICD-10-CM | POA: Diagnosis not present

## 2018-05-16 ENCOUNTER — Other Ambulatory Visit: Payer: Self-pay | Admitting: Physician Assistant

## 2018-05-16 ENCOUNTER — Other Ambulatory Visit: Payer: Self-pay | Admitting: Internal Medicine

## 2018-05-18 ENCOUNTER — Other Ambulatory Visit: Payer: Self-pay | Admitting: Internal Medicine

## 2018-05-18 DIAGNOSIS — Z1231 Encounter for screening mammogram for malignant neoplasm of breast: Secondary | ICD-10-CM

## 2018-05-19 DIAGNOSIS — G4733 Obstructive sleep apnea (adult) (pediatric): Secondary | ICD-10-CM | POA: Diagnosis not present

## 2018-05-24 ENCOUNTER — Encounter: Payer: Self-pay | Admitting: Internal Medicine

## 2018-05-24 NOTE — Patient Instructions (Addendum)
Recommend Adult Low Dose Aspirin or  coated  Aspirin 81 mg daily  To reduce risk of Colon Cancer 20 %,  Skin Cancer 26 % ,  Melanoma 46%  and  Pancreatic cancer 60% +++++++++++++++++++++++++ Vitamin D goal  is between 70-100.  Please make sure that you are taking your Vitamin D as directed.  It is very important as a natural anti-inflammatory  helping hair, skin, and nails, as well as reducing stroke and heart attack risk.  It helps your bones and helps with mood. It also decreases numerous cancer risks so please take it as directed.  Low Vit D is associated with a 200-300% higher risk for CANCER  and 200-300% higher risk for HEART   ATTACK  &  STROKE.   ...................................... It is also associated with higher death rate at younger ages,  autoimmune diseases like Rheumatoid arthritis, Lupus, Multiple Sclerosis.    Also many other serious conditions, like depression, Alzheimer's Dementia, infertility, muscle aches, fatigue, fibromyalgia - just to name a few. ++++++++++++++++++++ Recommend the book "The END of DIETING" by Dr Joel Fuhrman  & the book "The END of DIABETES " by Dr Joel Fuhrman At [email or web address] - get book & Audio CD's    Being diabetic has a  300% increased risk for heart attack, stroke, cancer, and alzheimer- type vascular dementia. It is very important that you work harder with diet by avoiding all foods that are white. Avoid white rice (brown & wild rice is OK), white potatoes (sweetpotatoes in moderation is OK), White bread or wheat bread or anything made out of white flour like bagels, donuts, rolls, buns, biscuits, cakes, pastries, cookies, pizza crust, and pasta (made from white flour & egg whites) - vegetarian pasta or spinach or wheat pasta is OK. Multigrain breads like Arnold's or Pepperidge Farm, or multigrain sandwich thins or flatbreads.  Diet, exercise and weight loss can reverse and cure diabetes in the early stages.  Diet, exercise and weight loss is  very important in the control and prevention of complications of diabetes which affects every system in your body, ie. Brain - dementia/stroke, eyes - glaucoma/blindness, heart - heart attack/heart failure, kidneys - dialysis, stomach - gastric paralysis, intestines - malabsorption, nerves - severe painful neuritis, circulation - gangrene & loss of a leg(s), and finally cancer and Alzheimers.    I recommend avoid fried & greasy foods,  sweets/candy, white rice (brown or wild rice or Quinoa is OK), white potatoes (sweet potatoes are OK) - anything made from white flour - bagels, doughnuts, rolls, buns, biscuits,white and wheat breads, pizza crust and traditional pasta made of white flour & egg white(vegetarian pasta or spinach or wheat pasta is OK).  Multi-grain bread is OK - like multi-grain flat bread or sandwich thins. Avoid alcohol in excess. Exercise is also important.    Eat all the vegetables you want - avoid meat, especially red meat and dairy - especially cheese.  Cheese is the most concentrated form of trans-fats which is the worst thing to clog up our arteries. Veggie cheese is OK which can be found in the fresh produce section at [employer] or [employer] or [employer]  +++++++++++++++++++++ DASH Eating Plan  DASH stands for "Dietary Approaches to Stop Hypertension."   The DASH eating plan is a healthy eating plan that has been shown to reduce high blood pressure (hypertension). Additional health benefits may include reducing the risk of type 2 diabetes mellitus, heart disease, and stroke. The DASH eating plan may also   help with weight loss. WHAT DO I NEED TO KNOW ABOUT THE DASH EATING PLAN? For the DASH eating plan, you will follow these general guidelines:  Choose foods with a percent daily value for sodium of less than 5% (as listed on the food label).  Use salt-free seasonings or herbs instead of table salt or sea salt.  Check with your health care provider or pharmacist before  using salt substitutes.  Eat lower-sodium products, often labeled as "lower sodium" or "no salt added."  Eat fresh foods.  Eat more vegetables, fruits, and low-fat dairy products.  Choose whole grains. Look for the word "whole" as the first word in the ingredient list.  Choose fish   Limit sweets, desserts, sugars, and sugary drinks.  Choose heart-healthy fats.  Eat veggie cheese   Eat more home-cooked food and less restaurant, buffet, and fast food.  Limit fried foods.  Cook foods using methods other than frying.  Limit canned vegetables. If you do use them, rinse them well to decrease the sodium.  When eating at a restaurant, ask that your food be prepared with less salt, or no salt if possible.                      WHAT FOODS CAN I EAT? Read Dr Joel Fuhrman's books on The End of Dieting & The End of Diabetes  Grains Whole grain or whole wheat bread. Brown rice. Whole grain or whole wheat pasta. Quinoa, bulgur, and whole grain cereals. Low-sodium cereals. Corn or whole wheat flour tortillas. Whole grain cornbread. Whole grain crackers. Low-sodium crackers.  Vegetables Fresh or frozen vegetables (raw, steamed, roasted, or grilled). Low-sodium or reduced-sodium tomato and vegetable juices. Low-sodium or reduced-sodium tomato sauce and paste. Low-sodium or reduced-sodium canned vegetables.   Fruits All fresh, canned (in natural juice), or frozen fruits.  Protein Products  All fish and seafood.  Dried beans, peas, or lentils. Unsalted nuts and seeds. Unsalted canned beans.  Dairy Low-fat dairy products, such as skim or 1% milk, 2% or reduced-fat cheeses, low-fat ricotta or cottage cheese, or plain low-fat yogurt. Low-sodium or reduced-sodium cheeses.  Fats and Oils Tub margarines without trans fats. Light or reduced-fat mayonnaise and salad dressings (reduced sodium). Avocado. Safflower, olive, or canola oils. Natural peanut or almond butter.  Other Unsalted popcorn  and pretzels. The items listed above may not be a complete list of recommended foods or beverages. Contact your dietitian for more options.  +++++++++++++++  WHAT FOODS ARE NOT RECOMMENDED? Grains/ White flour or wheat flour White bread. White pasta. White rice. Refined cornbread. Bagels and croissants. Crackers that contain trans fat.  Vegetables  Creamed or fried vegetables. Vegetables in a . Regular canned vegetables. Regular canned tomato sauce and paste. Regular tomato and vegetable juices.  Fruits Dried fruits. Canned fruit in light or heavy syrup. Fruit juice.  Meat and Other Protein Products Meat in general - RED meat & White meat.  Fatty cuts of meat. Ribs, chicken wings, all processed meats as bacon, sausage, bologna, salami, fatback, hot dogs, bratwurst and packaged luncheon meats.  Dairy Whole or 2% milk, cream, half-and-half, and cream cheese. Whole-fat or sweetened yogurt. Full-fat cheeses or blue cheese. Non-dairy creamers and whipped toppings. Processed cheese, cheese spreads, or cheese curds.  Condiments Onion and garlic salt, seasoned salt, table salt, and sea salt. Canned and packaged gravies. Worcestershire sauce. Tartar sauce. Barbecue sauce. Teriyaki sauce. Soy sauce, including reduced sodium. Steak sauce. Fish sauce. Oyster sauce. Cocktail   sauce. Horseradish. Ketchup and mustard. Meat flavorings and tenderizers. Bouillon cubes. Hot sauce. Tabasco sauce. Marinades. Taco seasonings. Relishes.  Fats and Oils Butter, stick margarine, lard, shortening and bacon fat. Coconut, palm kernel, or palm oils. Regular salad dressings.  Pickles and olives. Salted popcorn and pretzels.  The items listed above may not be a complete list of foods and beverages to avoid.    Gluten-Free Diet for Celiac Disease, Adult  The gluten-free diet includes all foods that do not contain gluten. Gluten is a protein that is found in wheat, rye, barley, and some other grains. Following the  gluten-free diet is the only treatment for people with celiac disease. It helps to prevent damage to the intestines and improves or eliminates the symptoms of celiac disease. Following the gluten-free diet requires some planning. It can be challenging at first, but it gets easier with time and practice. There are more gluten-free options available today than ever before. If you need help finding gluten-free foods or if you have questions, talk with your diet and nutrition specialist (registered dietitian) or your health care provider. What do I need to know about a gluten-free diet?  All fruits, vegetables, and meats are safe to eat and do not contain gluten.  When grocery shopping, start by shopping in the produce, meat, and dairy sections. These sections are more likely to contain gluten-free foods. Then move to the aisles that contain packaged foods if you need to.  Read all food labels. Gluten is often added to foods. Always check the ingredient list and look for warnings, such as "may contain gluten."  Talk with your dietitian or health care provider before taking a gluten-free multivitamin or mineral supplement.  Be aware of gluten-free foods having contact with foods that contain gluten (cross-contamination). This can happen at home and with any processed foods. ? Talk with your health care provider or dietitian about how to reduce the risk of cross-contamination in your home. ? If you have questions about how a food is processed, ask the manufacturer. What key words help to identify gluten? Foods that list any of these key words on the label usually contain gluten:  Wheat, flour, enriched flour, bromated flour, white flour, durum flour, graham flour, phosphated flour, self-rising flour, semolina, farina, barley (malt), rye, and oats.  Starch, dextrin, modified food starch, or cereal.  Thickening, fillers, or emulsifiers.  Malt flavoring, malt extract, or malt syrup.  Hydrolyzed  vegetable protein. In the [place] packaged foods that are gluten-free are required to be labeled "GF." These foods should be easy to identify and are safe to eat. In the [place] food companies are also required to list common food allergens, including wheat, on their labels. Recommended foods Grains  Amaranth, bean flours, 100% buckwheat flour, corn, millet, nut flours or nut meals, GF oats, quinoa, rice, sorghum, teff, rice wafers, pure cornmeal tortillas, popcorn, and hot cereals made from cornmeal. Hominy, rice, wild rice. Some Asian rice noodles or bean noodles. Arrowroot starch, corn bran, corn flour, corn germ, cornmeal, corn starch, potato flour, potato starch flour, and rice bran. Plain, brown, and sweet rice flours. Rice polish, soy flour, and tapioca starch. Vegetables  All plain fresh, frozen, and canned vegetables. Fruits  All plain fresh, frozen, canned, and dried fruits, and 100% fruit juices. Meats and other protein foods  All fresh beef, pork, poultry, fish, seafood, and eggs. Fish canned in water, oil, brine, or vegetable broth. Plain nuts and seeds, peanut butter. Some lunch meat and  some frankfurters. Dried beans, dried peas, and lentils. Dairy  Fresh plain, dry, evaporated, or condensed milk. Cream, butter, sour cream, whipping cream, and most yogurts. Unprocessed cheese, most processed cheeses, some cottage cheese, some cream cheeses. Beverages  Coffee, tea, most herbal teas. Carbonated beverages and some root beers. Wine, sake, and distilled spirits, such as gin, vodka, and whiskey. Most hard ciders. Fats and oils  Butter, margarine, vegetable oil, hydrogenated butter, olive oil, shortening, lard, cream, and some mayonnaise. Some commercial salad dressings. Olives. Sweets and desserts  Sugar, honey, some syrups, molasses, jelly, and jam. Plain hard candy, marshmallows, and gumdrops. Pure cocoa powder. Plain chocolate. Custard and some pudding mixes. Gelatin desserts,  sorbets, frozen ice pops, and sherbet. Cake, cookies, and other desserts prepared with allowed flours. Some commercial ice creams. Cornstarch, tapioca, and rice puddings. Seasoning and other foods  Some canned or frozen soups. Monosodium glutamate (MSG). Cider, rice, and wine vinegar. Baking soda and baking powder. Cream of tartar. Baking and nutritional yeast. Certain soy sauces made without wheat (ask your dietitian about specific brands that are allowed). Nuts, coconut, and chocolate. Salt, pepper, herbs, spices, flavoring extracts, imitation or artificial flavorings, natural flavorings, and food colorings. Some medicines and supplements. Some lip glosses and other cosmetics. Rice syrups. The items listed may not be a complete list. Talk with your dietitian about what dietary choices are best for you. Foods to avoid Grains  Barley, bran, bulgur, couscous, cracked wheat, Hawkeye, farro, graham, malt, matzo, semolina, wheat germ, and all wheat and rye cereals including spelt and kamut. Cereals containing malt as a flavoring, such as rice cereal. Noodles, spaghetti, macaroni, most packaged rice mixes, and all mixes containing wheat, rye, barley, or triticale. Vegetables  Most creamed vegetables and most vegetables canned in sauces. Some commercially prepared vegetables and salads. Fruits  Thickened or prepared fruits and some pie fillings. Some fruit snacks and fruit roll-ups. Meats and other protein foods  Any meat or meat alternative containing wheat, rye, barley, or gluten stabilizers. These are often marinated or packaged meats and lunch meats. Bread-containing products, such as Swiss steak, croquettes, meatballs, and meatloaf. Most tuna canned in vegetable broth and [place] with hydrolyzed vegetable protein (HVP) injected as part of the basting. Seitan. Imitation fish. Eggs in sauces made from ingredients to avoid. Dairy  Commercial chocolate milk drinks and malted milk. Some non-dairy  creamers. Any cheese product containing ingredients to avoid. Beverages  Certain cereal beverages. Beer, ale, malted milk, and some root beers. Some hard ciders. Some instant flavored coffees. Some herbal teas made with barley or with barley malt added. Fats and oils  Some commercial salad dressings. Sour cream containing modified food starch. Sweets and desserts  Some toffees. Chocolate-coated nuts (may be rolled in wheat flour) and some commercial candies and candy bars. Most cakes, cookies, donuts, pastries, and other baked goods. Some commercial ice cream. Ice cream cones. Commercially prepared mixes for cakes, cookies, and other desserts. Bread pudding and other puddings thickened with flour. Products containing brown rice syrup made with barley malt enzyme. Desserts and sweets made with malt flavoring. Seasoning and other foods  Some curry powders, some dry seasoning mixes, some gravy extracts, some meat sauces, some ketchups, some prepared mustards, and horseradish. Certain soy sauces. Malt vinegar. Bouillon and bouillon cubes that contain HVP. Some chip dips, and some chewing gum. Yeast extract. Brewer's yeast. Caramel color. Some medicines and supplements. Some lip glosses and other cosmetics. The items listed may not be a complete list.  Talk with your dietitian about what dietary choices are best for you. Summary  Gluten is a protein that is found in wheat, rye, barley, and some other grains. The gluten-free diet includes all foods that do not contain gluten.  If you need help finding gluten-free foods or if you have questions, talk with your diet and nutrition specialist (registered dietitian) or your health care provider.  Read all food labels. Gluten is often added to foods. Always check the ingredient list and look for warnings, such as "may contain gluten." Low-Gluten Eating Plan Gluten is a protein that is found in wheat, barley, rye, and some other grains. Some people have a  condition that makes them unable to digest gluten. For those people, eating just a small amount of gluten can damage their intestines. This is not a gluten-free eating plan. This low-gluten eating plan is for people who feel better when they eat less gluten. What do I need to know about this eating plan?  You can eat anything that does not contain wheat or other grains that have gluten.  You can eat anything that is labeled "gluten-free."  Make sure to read food labels.  Eat a variety of foods so you get all of the nutrients that you need.  Avoid processed foods and sauces because many of them contain wheat. To have more control over the ingredients in your meals, consider making food yourself instead of buying prepared foods. What foods can I eat? Grains Rice. Bulgur. Quinoa. Corn. Buckwheat. Amaranth. Corn tortillas or taco shells. Oatmeal that is labeled as "gluten free" or "uncontaminated." Vegetables Lettuce. Spinach. Peas. Beets. Cauliflower. Cabbage. Broccoli. Carrots. Tomatoes. Squash. Eggplant. Herbs. Peppers. Onions. Cucumbers. Brussels sprouts. Yams and sweet potatoes. Beans. Lentils. Fruits Bananas. Apples. Oranges. Grapes. Papaya. Mango. Pomegranate. Kiwi. Grapefruit. Cherries. Meats and Other Protein Sources Beef. Pork. Chicken. [place]. Fish. Eggs. Tofu. Beans. Nuts. Lentils. Dairy Milk. Ice cream. Yogurt. Cheese. Cottage cheese. Beverages Water. Coffee. Tea. Juice. Soda. Seltzer water. Condiments Mustard. Relish. Low-fat, low-sugar ketchup. Low-fat, low-sugar barbecue sauce. Vinegar. Low-fat or fat-free mayonnaise. Sweets and Desserts Honey. Sugar. Maple syrup. Fats and Oils Butter. Vegetable oil. Olive oil. Canola oil. [place] oil. Other Arrowroot or cornstarch. Potato flour. The items listed above may not be a complete list of recommended foods or beverages. Contact your dietitian for more options. What foods are not recommended? Grains Wheat. Barley. Rye.  Oatmeal. Meat and Other Protein Sources Seitan. Cold cuts. Hotdogs. Salami. Sausages. Beverages Beer. Condiments Malt vinegar. Salad dressing. Soy sauce. Sweets and Desserts Licorice. Brown rice syrup. Pre-made pudding or pudding mixes. Other Bouillon cubes. Canned or boxed pre-made soups or soup packets. Bagged chips, such as potato chips and tortilla chips. Seasoning packets. The items listed above may not be a complete list of foods and beverages to avoid. Contact your dietitian for more information.

## 2018-05-24 NOTE — Progress Notes (Signed)
This very nice 72 y.o. [name] presents for 6 month follow up with HTN, HLD, T2_DM and Vitamin D Deficiency. Patient is on CPAP for OSA and reports difficulty with "mask fit" & relates she does not perceive a benefit of using the CPAP.     Patient has hx/o very labile HTN (1978) with wide swings requiring multi-drugs for control.  In 2014, Renal U/Sfound no Renal Aa stenosis. Today's BP is at goal -116/64. She repots she monitors BP's 2 - 3 x/day noting a tendency for BP rising toward Bedtime. Patient has had no complaints of any cardiac type chest pain, palpitations, dyspnea / orthopnea / PND, dizziness, claudication, or dependent edema.     Hyperlipidemia is controlled with diet & meds. Patient denies myalgias or other med SE's. Last Lipids were at goal albeit elevated Trig's: Lab Results  Component Value Date   CHOL 157 02/18/2018   HDL 44 (L) 02/18/2018   LDLCALC 84 02/18/2018   TRIG 203 (H) 02/18/2018   CHOLHDL 3.6 02/18/2018      Also, the patient has history of  T2_DM  since 2006 with CKD2  (GFR 71) and has had no symptoms of reactive hypoglycemia, diabetic polys, paresthesias or visual blurring.  Last A1c was at goal: Lab Results  Component Value Date   HGBA1C 5.4 02/18/2018      Further, the patient also has history of Vitamin D Deficiency  ("19" / 2008) and supplements vitamin D without any suspected side-effects. Last vitamin D was near goal (70- 100): Lab Results  Component Value Date   VD25OH 56 02/18/2018   Current Outpatient Medications on File Prior to Visit  Medication Sig  . ALPRAZolam (XANAX) 0.5 MG tablet Take 1/2-1 tablet 1 to 2 x /day ONLY if needed for Anxiety Attack &  limit to 5 days /week to avoid addiction  . aspirin 325 MG EC tablet Take 325 mg by mouth daily.  Marland Kitchen atenolol (TENORMIN) 100 MG tablet Take 100 mg by mouth as needed. Takes for elevated pulse rate.  . Blood Glucose Monitoring Suppl (ONE TOUCH ULTRA 2) w/Device KIT Check blood sugar 1 time  daily-E11.22  . Cholecalciferol (VITAMIN D PO) Take 10,000 Units by mouth daily.   . cloNIDine (CATAPRES) 0.2 MG tablet Take 0.2 mg by mouth as needed. Takes if systolic BP is over 184.  . glucose blood (ONE TOUCH ULTRA TEST) test strip chec blood sugar 1 time daily-DX-E11.22  . Lancets (ONETOUCH ULTRASOFT) lancets Check blood sugar 1 time daily-E11.22  . Magnesium 500 MG TABS Take 1 tablet by mouth 3 (three) times daily.  . magnesium hydroxide (MILK OF MAGNESIA) 400 MG/5ML suspension Take by mouth daily as needed for mild constipation.  . metFORMIN (GLUCOPHAGE-XR) 500 MG 24 hr tablet Take 2 tablets 2 x /day with Meals for Diabetes  . minoxidil (LONITEN) 10 MG tablet TAKE 1/2TABLET BY MOUTH AT SUPPERTIME  . polyethylene glycol (MIRALAX / GLYCOLAX) packet Take 17 g by mouth daily.  . rosuvastatin (CRESTOR) 20 MG tablet Take 1/2 tablet three times a week  . senna (SENOKOT) 8.6 MG tablet Take 1 tablet by mouth daily.  . sertraline (ZOLOFT) 50 MG tablet Take 1 tablet daily for Mood  . telmisartan (MICARDIS) 80 MG tablet TAKE 1 TABLET BY MOUTH ONCE DAILY FOR BLOOD PRESSURE  . terbinafine (LAMISIL) 250 MG tablet Take 1 tablet daily for toenail fungus  . verapamil (CALAN-SR) 240 MG CR tablet Take 1 tablet (240 mg total) by  mouth at bedtime.  . furosemide (LASIX) 40 MG tablet Take 1 tablet (40 mg total) daily by mouth. (Patient not taking: Reported on 05/25/2018)   No current facility-administered medications on file prior to visit.    Allergies  Allergen Reactions  . Lipitor [Atorvastatin]     Elevates LFT's   PMHx:   Past Medical History:  Diagnosis Date  . Anemia   . Anxiety   . Arthritis   . Carpal tunnel syndrome   . Cerebrovascular disease 01/02/2017  . Duodenal diverticulum   . Esophageal motility disorder   . Fatty liver   . GERD (gastroesophageal reflux disease)   . Hyperlipidemia   . Hypertension   . 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 Sleep apnea   . Tubulovillous adenoma of  colon   . Type II or unspecified type diabetes mellitus without mention of complication, not stated as uncontrolled    Immunization History  Administered Date(s) Administered  . Influenza Split 03/25/2013  . Influenza, High Dose Seasonal PF 03/15/2014, 03/07/2015, 03/27/2016, 04/21/2017, 02/18/2018  . Pneumococcal Conjugate-13 01/26/2014  . Pneumococcal Polysaccharide-23 03/14/2011  . Td 03/25/2013  . Zoster 06/03/2005   Past Surgical History:  Procedure Laterality Date  . APPENDECTOMY    . BREAST BIOPSY Right   . CARPAL TUNNEL RELEASE Right 1992  . cataract extration    . COLONOSCOPY    . CYST REMOVAL HAND    . TONSILLECTOMY  1952   FHx:    Reviewed / unchanged  SHx:    Reviewed / unchanged   Systems Review:  Constitutional: Denies fever, chills, wt changes, headaches, insomnia, fatigue, night sweats, change in appetite. Eyes: Denies redness, blurred vision, diplopia, discharge, itchy, watery eyes.  ENT: Denies discharge, congestion, post nasal drip, epistaxis, sore throat, earache, hearing loss, dental pain, tinnitus, vertigo, sinus pain, snoring.  CV: Denies chest pain, palpitations, irregular heartbeat, syncope, dyspnea, diaphoresis, orthopnea, PND, claudication or edema. Respiratory: denies cough, dyspnea, DOE, pleurisy, hoarseness, laryngitis, wheezing.  Gastrointestinal: Denies dysphagia, odynophagia, heartburn, reflux, water brash, abdominal pain or cramps, nausea, vomiting, bloating, diarrhea, constipation, hematemesis, melena, hematochezia  or hemorrhoids. Genitourinary: Denies dysuria, frequency, urgency, nocturia, hesitancy, discharge, hematuria or flank pain. Musculoskeletal: Denies arthralgias, myalgias, stiffness, jt. swelling, pain, limping or strain/sprain.  Skin: Denies pruritus, rash, hives, warts, acne, eczema or change in skin lesion(s). Neuro: No weakness, tremor, incoordination, spasms, paresthesia or pain. Psychiatric: Denies confusion, memory loss or  sensory loss. Endo: Denies change in weight, skin or hair change.  Heme/Lymph: No excessive bleeding, bruising or enlarged lymph nodes.  Physical Exam  BP 116/64   Pulse 76   Temp (!) 97.1 F (36.2 C)   Resp 16   Ht '5\' 4"'  (1.626 m)   Wt 149 lb 3.2 oz (67.7 kg)   BMI 25.61 kg/m   Appears  well nourished, well groomed  and in no distress.  Eyes: PERRLA, EOMs, conjunctiva no swelling or erythema. Sinuses: No frontal/maxillary tenderness ENT/Mouth: EAC's clear, TM's nl w/o erythema, bulging. Nares clear w/o erythema, swelling, exudates. Oropharynx clear without erythema or exudates. Oral hygiene is good. Tongue normal, non obstructing. Hearing intact.  Neck: Supple. Thyroid not palpable. Car 2+/2+ without bruits, nodes or JVD. Chest: Respirations nl with BS clear & equal w/o rales, rhonchi, wheezing or stridor.  Cor: Heart sounds normal w/ regular rate and rhythm without sig. murmurs, gallops, clicks or rubs. Peripheral pulses normal and equal  without edema.  Abdomen: Soft & bowel sounds normal. Non-tender w/o guarding, rebound, hernias,  masses or organomegaly.  Lymphatics: Unremarkable.  Musculoskeletal: Full ROM all peripheral extremities, joint stability, 5/5 strength and normal gait.  Skin: Warm, dry without exposed rashes, lesions or ecchymosis apparent.  Neuro: Cranial nerves intact, reflexes equal bilaterally. Sensory-motor testing grossly intact. Tendon reflexes grossly intact.  Pysch: Alert & oriented x 3.  Insight and judgement nl & appropriate. No ideations.  Assessment and Plan:  1. Essential hypertension  - Continue medication, monitor blood pressure at home.  - Continue DASH diet.  Reminder to go to the ER if any CP,  SOB, nausea, dizziness, severe HA, changes vision/speech.  - CBC with Differential/Platelet - COMPLETE METABOLIC PANEL WITH GFR - Magnesium - TSH  2. Hyperlipidemia, mixed  - Continue diet/meds, exercise,& lifestyle modifications.  - Continue  monitor periodic cholesterol/liver & renal functions   - Lipid panel - TSH  3. Type 2 diabetes mellitus with stage 2 chronic kidney disease, without long-term current use of insulin (HCC)  - Hemoglobin A1c  - Insulin, random  4. Vitamin D deficiency  - Continue supplementation.   - VITAMIN D 25 Hydroxyl  5. Hyperthyroidism  - TSH  6. Medication management  - CBC with Differential/Platelet - COMPLETE METABOLIC PANEL WITH GFR - Magnesium - Lipid panel - TSH - Hemoglobin A1c - Insulin, random - VITAMIN D 25 Hydroxyl  7. Nervous  - clonazePAM1 MG tablet; Take 1/2 to 1 tablet at bedtime  &  limit to 5 days /week to avoid addiction  Dispense: 30 tablet; Refill: 0  8. Chronic anxiety  - citalopram (CELEXA) 40 MG tablet; Take 1 tab daily for anxiety and mood.  Disp: 90 tablet; Refill: 1       Discussed  regular exercise, BP monitoring, weight control to achieve/maintain BMI less than 25 and discussed med and SE's. Recommended labs to assess and monitor clinical status with further disposition pending results of labs. Over 30 minutes of exam, counseling, chart review was performed.

## 2018-05-25 ENCOUNTER — Other Ambulatory Visit: Payer: Self-pay

## 2018-05-25 ENCOUNTER — Ambulatory Visit (INDEPENDENT_AMBULATORY_CARE_PROVIDER_SITE_OTHER): Payer: PPO | Admitting: Internal Medicine

## 2018-05-25 VITALS — BP 116/64 | HR 76 | Temp 97.1°F | Resp 16 | Ht 64.0 in | Wt 149.2 lb

## 2018-05-25 DIAGNOSIS — Z79899 Other long term (current) drug therapy: Secondary | ICD-10-CM

## 2018-05-25 DIAGNOSIS — R450 Nervousness: Secondary | ICD-10-CM

## 2018-05-25 DIAGNOSIS — E559 Vitamin D deficiency, unspecified: Secondary | ICD-10-CM | POA: Diagnosis not present

## 2018-05-25 DIAGNOSIS — E782 Mixed hyperlipidemia: Secondary | ICD-10-CM

## 2018-05-25 DIAGNOSIS — E1322 Other specified diabetes mellitus with diabetic chronic kidney disease: Secondary | ICD-10-CM

## 2018-05-25 DIAGNOSIS — N182 Chronic kidney disease, stage 2 (mild): Secondary | ICD-10-CM

## 2018-05-25 DIAGNOSIS — I10 Essential (primary) hypertension: Secondary | ICD-10-CM

## 2018-05-25 DIAGNOSIS — E0590 Thyrotoxicosis, unspecified without thyrotoxic crisis or storm: Secondary | ICD-10-CM

## 2018-05-25 DIAGNOSIS — E1122 Type 2 diabetes mellitus with diabetic chronic kidney disease: Secondary | ICD-10-CM | POA: Diagnosis not present

## 2018-05-25 DIAGNOSIS — F419 Anxiety disorder, unspecified: Secondary | ICD-10-CM

## 2018-05-25 MED ORDER — CITALOPRAM HYDROBROMIDE 40 MG PO TABS: ORAL_TABLET | ORAL | 1 refills | Status: DC

## 2018-05-25 MED ORDER — CLONAZEPAM 1 MG PO TABS: ORAL_TABLET | ORAL | 0 refills | Status: DC

## 2018-05-25 NOTE — Patient Outreach (Signed)
[facility]) Care Management  05/25/2018  Michele [name] 03-Feb-1946 [id number]  Nurse Call Line Referral Date: 05/25/18 Reason for Referral:  CPAP supplies never arrived Nurse call line recommendation:  Contact CPAP provider [facility] Attempt #1  Telephone call to patient regarding nurse call line referral. Unable to reach patient. HIPAA compliant voice message left with call back phone number.   PLAN:  RNCM will attempt 2nd telephone call to patient within 4 business days,.  RNCM will send outreach letter to attempt contact   Quinn Plowman RN,BSN,CCM [facility] Telephonic  [phone]

## 2018-05-26 ENCOUNTER — Other Ambulatory Visit: Payer: Self-pay

## 2018-05-26 LAB — CBC WITH DIFFERENTIAL/PLATELET
Absolute Monocytes: 530 cells/uL (ref 200–950)
BASOS ABS: 21 {cells}/uL (ref 0–200)
Basophils Relative: 0.4 %
EOS PCT: 3.3 %
Eosinophils Absolute: 172 cells/uL (ref 15–500)
HEMATOCRIT: 39.6 % (ref 35.0–45.0)
Hemoglobin: 12.8 g/dL (ref 11.7–15.5)
LYMPHS ABS: 1576 {cells}/uL (ref 850–3900)
MCH: 25.6 pg — ABNORMAL LOW (ref 27.0–33.0)
MCHC: 32.3 g/dL (ref 32.0–36.0)
MCV: 79.2 fL — ABNORMAL LOW (ref 80.0–100.0)
MPV: 8.9 fL (ref 7.5–12.5)
Monocytes Relative: 10.2 %
NEUTROS ABS: 2902 {cells}/uL (ref 1500–7800)
NEUTROS PCT: 55.8 %
Platelets: 207 10*3/uL (ref 140–400)
RBC: 5 10*6/uL (ref 3.80–5.10)
RDW: 14.8 % (ref 11.0–15.0)
Total Lymphocyte: 30.3 %
WBC: 5.2 10*3/uL (ref 3.8–10.8)

## 2018-05-26 LAB — COMPLETE METABOLIC PANEL WITH GFR
AG RATIO: 1.6 (calc) (ref 1.0–2.5)
ALT: 30 U/L — AB (ref 6–29)
AST: 27 U/L (ref 10–35)
Albumin: 4.3 g/dL (ref 3.6–5.1)
Alkaline phosphatase (APISO): 88 U/L (ref 33–130)
BILIRUBIN TOTAL: 0.8 mg/dL (ref 0.2–1.2)
BUN: 12 mg/dL (ref 7–25)
CALCIUM: 10 mg/dL (ref 8.6–10.4)
CHLORIDE: 106 mmol/L (ref 98–110)
CO2: 28 mmol/L (ref 20–32)
Creat: 0.74 mg/dL (ref 0.60–0.93)
GFR, Est African American: 94 mL/min/{1.73_m2} (ref >=60)
GFR, Est Non African American: 81 mL/min/{1.73_m2} (ref >=60)
Globulin: 2.7 g/dL (calc) (ref 1.9–3.7)
Glucose, Bld: 98 mg/dL (ref 65–99)
POTASSIUM: 4.4 mmol/L (ref 3.5–5.3)
Sodium: 142 mmol/L (ref 135–146)
Total Protein: 7 g/dL (ref 6.1–8.1)

## 2018-05-26 LAB — TSH: TSH: 0.6 mIU/L (ref 0.40–4.50)

## 2018-05-26 LAB — LIPID PANEL
Cholesterol: 170 mg/dL (ref <200)
HDL: 49 mg/dL — AB (ref >50)
LDL Cholesterol (Calc): 88 mg/dL (calc)
Non-HDL Cholesterol (Calc): 121 mg/dL (calc) (ref <130)
Total CHOL/HDL Ratio: 3.5 (calc) (ref <5.0)
Triglycerides: 239 mg/dL — ABNORMAL HIGH (ref <150)

## 2018-05-26 LAB — INSULIN, RANDOM: Insulin: 11.6 u[IU]/mL (ref 2.0–19.6)

## 2018-05-26 LAB — HEMOGLOBIN A1C
EAG (MMOL/L): 6.5 (calc)
Hgb A1c MFr Bld: 5.7 % of total Hgb — ABNORMAL HIGH (ref <5.7)
MEAN PLASMA GLUCOSE: 117 (calc)

## 2018-05-26 LAB — VITAMIN D 25 HYDROXY (VIT D DEFICIENCY, FRACTURES): Vit D, 25-Hydroxy: 63 ng/mL (ref 30–100)

## 2018-05-26 LAB — MAGNESIUM: MAGNESIUM: 1.8 mg/dL (ref 1.5–2.5)

## 2018-05-26 NOTE — Patient Outreach (Signed)
[facility]) Care Management  05/26/2018  Michele [name] 06/21/1945 [id number]  Nurse Call Line Referral Date: 05/25/18 Reason for Referral:  CPAP supplies never arrived Nurse call line recommendation:  Contact CPAP provider [facility]  Telephone call to patient regarding nurse call line referral. HIPAA verified. Explained reason for call. Patient states she received her CPAP supplies on yesterday.  Patient states she thought she was calling [facility] and ended up call the nurse call line. Patient states she is doing fine and does not have any further needs/ concerns. RNCM advised patient to notify MD of any changes in condition prior to scheduled appointment. RNCM provided contact name and number: [phone] or main office number [phone] and 24 hour nurse advise line [phone].  RNCM verified patient aware of 911 services for urgent/ emergent needs.  PLAN; RNCM will close patient due to patient being assessed and having no further needs.   Quinn Plowman RN,BSN,CCM [facility] Telephonic  [phone]

## 2018-06-08 DIAGNOSIS — G4733 Obstructive sleep apnea (adult) (pediatric): Secondary | ICD-10-CM | POA: Diagnosis not present

## 2018-06-10 ENCOUNTER — Other Ambulatory Visit: Payer: Self-pay | Admitting: Adult Health

## 2018-06-10 DIAGNOSIS — F419 Anxiety disorder, unspecified: Secondary | ICD-10-CM

## 2018-06-14 ENCOUNTER — Other Ambulatory Visit: Payer: Self-pay | Admitting: Internal Medicine

## 2018-06-23 ENCOUNTER — Ambulatory Visit
Admission: RE | Admit: 2018-06-23 | Discharge: 2018-06-23 | Disposition: A | Discharge status: Home / Self Care | Payer: PPO | Source: Ambulatory Visit | Attending: Internal Medicine | Admitting: Internal Medicine

## 2018-06-23 DIAGNOSIS — Z1231 Encounter for screening mammogram for malignant neoplasm of breast: Secondary | ICD-10-CM

## 2018-06-29 ENCOUNTER — Other Ambulatory Visit: Payer: Self-pay | Admitting: Internal Medicine

## 2018-06-29 DIAGNOSIS — B351 Tinea unguium: Secondary | ICD-10-CM

## 2018-07-09 DIAGNOSIS — G4733 Obstructive sleep apnea (adult) (pediatric): Secondary | ICD-10-CM | POA: Diagnosis not present

## 2018-07-29 DIAGNOSIS — Z8379 Family history of other diseases of the digestive system: Secondary | ICD-10-CM | POA: Diagnosis not present

## 2018-08-06 DIAGNOSIS — Z8379 Family history of other diseases of the digestive system: Secondary | ICD-10-CM | POA: Diagnosis not present

## 2018-08-07 DIAGNOSIS — G4733 Obstructive sleep apnea (adult) (pediatric): Secondary | ICD-10-CM | POA: Diagnosis not present

## 2018-08-14 ENCOUNTER — Other Ambulatory Visit: Payer: Self-pay | Admitting: Adult Health

## 2018-08-14 ENCOUNTER — Other Ambulatory Visit: Payer: Self-pay | Admitting: Internal Medicine

## 2018-08-27 ENCOUNTER — Ambulatory Visit: Payer: Self-pay | Admitting: Physician Assistant

## 2018-08-28 DIAGNOSIS — G4733 Obstructive sleep apnea (adult) (pediatric): Secondary | ICD-10-CM | POA: Diagnosis not present

## 2018-09-07 DIAGNOSIS — G4733 Obstructive sleep apnea (adult) (pediatric): Secondary | ICD-10-CM | POA: Diagnosis not present

## 2018-09-15 ENCOUNTER — Other Ambulatory Visit: Payer: Self-pay | Admitting: Adult Health

## 2018-09-15 DIAGNOSIS — R0989 Other specified symptoms and signs involving the circulatory and respiratory systems: Secondary | ICD-10-CM

## 2018-09-16 ENCOUNTER — Other Ambulatory Visit: Payer: Self-pay | Admitting: Internal Medicine

## 2018-09-16 DIAGNOSIS — I10 Essential (primary) hypertension: Secondary | ICD-10-CM

## 2018-09-16 MED ORDER — OLMESARTAN MEDOXOMIL 40 MG PO TABS: ORAL_TABLET | ORAL | 3 refills | Status: DC

## 2018-09-17 NOTE — Progress Notes (Addendum)
MEDICARE ANNUAL WELLNESS VISIT AND FOLLOW UP  THIS ENCOUNTER IS A VIRTUAL/TELEVIDEO  VISIT DUE TO COVID-19 - PATIENT WAS NOT SEEN IN THE OFFICE.  PATIENT HAS CONSENTED TO VIRTUAL VISIT / TELEVIDEO VISIT  This provider placed a call to Michele [name] , her appointment was changed to a virtual office visit to reduce the risk of exposure to the COVID-19 virus and to help Michele [name] remain healthy and safe. The virtual visit will also provide continuity of care. She verbalizes understanding.   Assessment:    Comprehensive care plan:  Patient/caregiver was given comprehensive care plan We will continue to monitor these goals every 3 months with an office visit and every month by a telephone call  Patient can contact the office any time with the phone number and get to a provider via the answering service or they can use Mychart.   Verbal permission was received from the patient to review comprehensive care management, they understand they have the right to stop CCM services at any time.   The patient is self managing medications at home.  Medications were reviewed with the patient today as well as adherence and potential interactions.   The patient does not need home health services at this time.    Encounter for Medicare annual wellness exam  Cerebrovascular disease Control blood pressure, cholesterol, glucose, increase exercise.   Essential hypertension Monitor blood pressure at home; call if consistently over 130/80, will follow up closely next week Continue DASH diet.   Reminder to go to the ER if any CP, SOB, nausea, dizziness, severe HA, changes vision/speech, left arm numbness and tingling and jaw pain.  Fatty liver Weight loss advised; continue to monitor  Gastroesophageal reflux disease, esophagitis presence not specified Well managed on current medications Discussed diet, avoiding triggers and other lifestyle changes  Hyperthyroidism      Monitor TSH levels -  recently within normal range   Type 2 diabetes mellitus with diabetic nephropathy, without long-term current use of insulin ([place]) Discussed general issues about diabetes pathophysiology and management., Educational material distributed., Suggested low cholesterol diet., Encouraged aerobic exercise., Discussed foot care., Reminded to get yearly retinal exam- most recent exam report available and extracted.  CKD stage 3 due to type 2 diabetes mellitus (HCC) Increase fluids, avoid NSAIDS, monitor sugars, will monitor  Anemia, unspecified type -     Check CBC  COLONIC POLYPS, ADENOMATOUS, HX OF Colonoscopies q5 years, due 09/2017   Idiopathic gout, unspecified chronicity, unspecified site Controlled without recent flare, Diet discussed, Check uric acid as needed  Mixed hyperlipidemia Continue medications: crestor 20 mg daily Continue low cholesterol diet and exercise.   Vitamin D deficiency Near goal at recent check; continue to recommend supplementation for goal of 70-100 Defer vitamin D level  Overweight (BMI 25.0-29.9) Long discussion about weight loss, diet, and exercise Recommended diet heavy in fruits and veggies and low in animal meats, cheeses, and dairy products, appropriate calorie intake Discussed appropriate weight for height Follow up at next visit  Over 40 minutes of exam, counseling, chart review and critical decision making was performed Future Appointments  Date Time Provider [place]  12/01/2018 10:00 AM [name], MD [facility] None  12/10/2018  9:30 AM Ward Givens, NP [facility] None     Plan:   During the course of the visit the patient was educated and counseled about appropriate screening and preventive services including:    Pneumococcal vaccine   Prevnar 13  Influenza vaccine  Td  vaccine  Screening electrocardiogram  Bone densitometry screening  Colorectal cancer screening  Diabetes screening  Glaucoma  screening  Nutrition counseling   Advanced directives: requested   Subjective:  Michele [name] is a 73 y.o. female who presents for Medicare Annual Wellness Visit and 3 month follow up.   Son and daughter tested for celiac positive, patient was tested for gene which she has but she does not have evidence of celiac at this point. She does have vitiligo and fatigue. She has had negative ANA, antismith, renin/ACE, lyme,   She has been having more constipation, she is on stool softener and every 2-3 days a laxative.  She has been taking the celexa and the zoloft, will stop one.   BMI is Body mass index is 25.06 kg/m., she has not been working on diet and exercise. Wt Readings from Last 3 Encounters:  09/18/18 146 lb (66.2 kg)  05/25/18 149 lb 3.2 oz (67.7 kg)  04/09/18 154 lb 3.2 oz (69.9 kg)    Her blood pressure has been controlled at home, she is on benicar 26m 1/2 a day just started, verapamil 240, minoxidil 10 mg 1/2 at supper today their BP is BP: 132/72 She does workout (works on a farm) She denies chest pain, shortness of breath. She will occ take a clonodine if her BP is over 160, very rare, in the late evening if it happens.  She is on cholesterol medication, crestor 155m3 x a week and denies myalgias. Her total and LDL cholesterol is not at goal, trigs remained somewhat elevated. The cholesterol last visit was:   Lab Results  Component Value Date   CHOL 170 05/25/2018   HDL 49 (L) 05/25/2018   LDLCALC 88 05/25/2018   TRIG 239 (H) 05/25/2018   CHOLHDL 3.5 05/25/2018    She has not been working on diet and exercise for T2 diabetes, well controlled with metformin with recent A1C in prediabetic range, sugar was 106 this AM, highest is 120 int he morning and denies increased appetite, nausea, paresthesia of the feet, polydipsia, polyuria, visual disturbances and vomiting. Last A1C in the office was:  Lab Results  Component Value Date   HGBA1C 5.7 (H) 05/25/2018   Last  GFR: Lab Results  Component Value Date   GFRNONAA 81 05/25/2018   Patient is on Vitamin D supplement and was near goal of 70 at last check:    Lab Results  Component Value Date   VD25OH 63 05/25/2018     She has a documented history of hyperthyroidism. She is not currently on medications for thyroid, last TSH was within normal range:    Lab Results  Component Value Date   TSH 0.60 05/25/2018   Patient is not on allopurinol for gout and does not report a recent flare.  Lab Results  Component Value Date   LABURIC 5.9 11/06/2017     Medication Review: Current Outpatient Medications on File Prior to Visit  Medication Sig Dispense Refill  . ALPRAZolam (XANAX) 0.5 MG tablet TAKE ONE HALF TO ONE TABLET ONCE TO TWICE DAILY. ONLY IF NEEDED FOR ANXIETY ATTACK AND LIMIT TO 5 DAYS PER WEEK TO AVOID ADDICTION 60 tablet 0  . aspirin 325 MG EC tablet Take 325 mg by mouth daily.    . Blood Glucose Monitoring Suppl (ONE TOUCH ULTRA 2) w/Device KIT Check blood sugar 1 time daily-E11.22 1 each 0  . Cholecalciferol (VITAMIN D PO) Take 10,000 Units by mouth daily.     .Marland Kitchen  citalopram (CELEXA) 40 MG tablet TAKE 1 TABLET BY MOUTH ONCE DAILY FOR ANXIETY AND  MOOD 90 tablet 1  . cloNIDine (CATAPRES) 0.2 MG tablet Take 0.2 mg by mouth as needed. Takes if systolic BP is over 329.    . furosemide (LASIX) 40 MG tablet Take 1 tablet (40 mg total) daily by mouth. 90 tablet 99  . Lancets (ONETOUCH ULTRASOFT) lancets Check blood sugar 1 time daily-E11.22 100 each 4  . Magnesium 500 MG TABS Take 1 tablet by mouth 3 (three) times daily.    . magnesium hydroxide (MILK OF MAGNESIA) 400 MG/5ML suspension Take by mouth daily as needed for mild constipation.    . metFORMIN (GLUCOPHAGE-XR) 500 MG 24 hr tablet Take 2 tablets 2 x /day with Meals for Diabetes 360 tablet 3  . minoxidil (LONITEN) 10 MG tablet TAKE 1/2 (ONE-HALF) TABLET BY MOUTH  AT SUPPERTIME 90 tablet 0  . olmesartan (BENICAR) 40 MG tablet Take 1 tablet Daily  for BP 90 tablet 3  . polyethylene glycol (MIRALAX / GLYCOLAX) packet Take 17 g by mouth daily.    . rosuvastatin (CRESTOR) 20 MG tablet Take 1/2 tablet three times a week 90 tablet 1  . senna (SENOKOT) 8.6 MG tablet Take 1 tablet by mouth daily.    Marland Kitchen terbinafine (LAMISIL) 250 MG tablet TAKE 1 TABLET BY MOUTH ONCE DAILY FOR  TOENAIL  FUNGUS 90 tablet 0  . verapamil (CALAN-SR) 240 MG CR tablet Take 1 tablet (240 mg total) by mouth at bedtime. 90 tablet 3   No current facility-administered medications on file prior to visit.     Allergies  Allergen Reactions  . Lipitor [Atorvastatin]     Elevates LFT's    Current Problems (verified) Patient Active Problem List   Diagnosis Date Noted  . Vitiligo 11/09/2017  . Encounter for Medicare annual wellness exam 07/24/2017  . Type 2 diabetes mellitus ([place]) 07/24/2017  . Overweight (BMI 25.0-29.9) 07/24/2017  . Cerebrovascular disease 01/02/2017  . Hyperthyroidism 09/11/2015  . Gout 09/22/2014  . Vitamin D deficiency 06/28/2013  . Medication management 06/28/2013  . Anemia 06/28/2013  . CKD stage 3 due to type 2 diabetes mellitus ([place])   . Mixed hyperlipidemia 03/21/2008  . Essential hypertension 03/21/2008  . GERD 03/21/2008  . Fatty liver 03/21/2008  . COLONIC POLYPS, ADENOMATOUS, HX OF 03/21/2008    Screening Tests Immunization History  Administered Date(s) Administered  . Influenza Split 03/25/2013  . Influenza, High Dose Seasonal PF 03/15/2014, 03/07/2015, 03/27/2016, 04/21/2017, 02/18/2018  . Pneumococcal Conjugate-13 01/26/2014  . Pneumococcal Polysaccharide-23 03/14/2011  . Td 03/25/2013  . Zoster 06/03/2005   Preventative care: Last colonoscopy: 02/2018 Last mammogram:06/2018 DEXA: 01/2018  Prior vaccinations: TD or Tdap: 2014  Influenza: 2019  Pneumococcal: 2012 Prevnar13: 2014 Shingles/Zostavax: 2007  Names of Other Physician/Practitioners you currently use: 1. [facility]-  here for primary care 2. Dr. Ellie Lunch, eye doctor, last visit 07/2017- will need to get this year 3. Dr. Illene Bolus, dentist, last visit 2019 q 6 months  Patient Care Team: [name], MD as PCP - General (Internal Medicine)  SURGICAL HISTORY She  has a past surgical history that includes Appendectomy; Tonsillectomy (1952); Carpal tunnel release (Right, 1992); Cyst removal hand; Breast biopsy (Right); cataract extration; and Colonoscopy. FAMILY HISTORY Her family history includes Cerebral aneurysm in her sister; Colon polyps in her mother; Diabetes in her father and mother; Heart attack in her brother and father; Heart disease in her brother, brother, father,  and mother. SOCIAL HISTORY She  reports that she has never smoked. She has never used smokeless tobacco. She reports that she does not drink alcohol or use drugs.   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), Time (Minutes): 20, Frequency (Times/Week): 5, Weekly Exercise (Minutes/Week): 100, Intensity: Mild Cardiac risk factors: Cardiac Risk Factors include: advanced age (>32mn, >>44women);diabetes mellitus;dyslipidemia;hypertension;family history of premature cardiovascular disease Depression/mood screen:   [facility]  Decreased Interest 0  Down, Depressed, Hopeless 0  PHQ - 2 Score 0  Altered sleeping -  Tired, decreased energy -  Change in appetite -  Feeling bad or failure about yourself  -  Trouble concentrating -  Moving slowly or fidgety/restless -  Suicidal thoughts -  PHQ-9 Score -  Difficult doing work/chores -    ADLs:  In your present state of health, do you have any difficulty performing the following activities: 09/18/2018 05/24/2018  Hearing? N N  Vision? N N  Difficulty concentrating or making decisions? N N  Walking or climbing stairs? N N  Dressing or bathing? N N   Doing errands, shopping? N N  Some recent data might be hidden     Cognitive Testing  Alert? Yes  Normal Appearance?Yes  Oriented to person? Yes  Place? Yes   Time? Yes  Recall of three objects?  Yes  Can perform simple calculations? Yes  Displays appropriate judgment?Yes  Can read the correct time from a watch face?Yes  EOL planning: Does Patient Have a Medical Advance Directive?: Yes Does patient want to make changes to medical advance directive?: No - Patient declined  Review of Systems  Constitutional: Positive for malaise/fatigue. Negative for weight loss.  HENT: Negative for ear pain (Intermittent, R ear, improved on allergy medication), hearing loss and tinnitus.   Eyes: Negative for blurred vision and double vision.  Respiratory: Negative for cough, shortness of breath and wheezing.   Cardiovascular: Negative for chest pain, palpitations, orthopnea, claudication and leg swelling.  Gastrointestinal: Negative for abdominal pain, blood in stool, constipation, diarrhea, heartburn, melena, nausea and vomiting.  Genitourinary: Negative.   Musculoskeletal: Negative for falls, joint pain and myalgias.  Skin: Negative for rash.  Neurological: Positive for weakness. Negative for dizziness, tingling, sensory change, speech change, focal weakness, loss of consciousness and headaches.  Endo/Heme/Allergies: Negative for polydipsia.  Psychiatric/Behavioral: Negative for depression, memory loss and substance abuse. The patient is not nervous/anxious and does not have insomnia.   All other systems reviewed and are negative.   Objective:     Today's Vitals   09/18/18 0921  BP: 132/72  Pulse: 83  Weight: 146 lb (66.2 kg)  Height: _0  (1.626 m)  PainSc: 0-No pain   Body mass index is 25.06 kg/m. General Appearance:Well sounding, in no apparent distress.  ENT/Mouth: No hoarseness, No cough for duration of visit.  Respiratory: completing full sentences without distress, without  audible wheeze Neuro: Awake and oriented X 3,  Psych:  Insight and Judgment appropriate.   Medicare Attestation I have personally reviewed: The patient's medical and social history Their use of alcohol, tobacco or illicit drugs Their current medications and supplements The patient's functional ability including ADLs,fall risks, home safety risks, cognitive, and hearing and visual impairment Diet and physical activities Evidence for depression or mood disorders  The patient's weight, height, BMI, and visual acuity have been recorded in the chart.  I have made referrals, counseling,  and provided education to the patient based on review of the above and I have provided the patient with a written personalized care plan for preventive services.     Vicie Mutters, PA-C   09/18/2018

## 2018-09-18 ENCOUNTER — Other Ambulatory Visit: Payer: Self-pay

## 2018-09-18 ENCOUNTER — Ambulatory Visit: Payer: PPO | Admitting: Physician Assistant

## 2018-09-18 ENCOUNTER — Encounter: Payer: Self-pay | Admitting: Physician Assistant

## 2018-09-18 VITALS — BP 132/72 | HR 83 | Ht 64.0 in | Wt 146.0 lb

## 2018-09-18 DIAGNOSIS — L80 Vitiligo: Secondary | ICD-10-CM | POA: Diagnosis not present

## 2018-09-18 DIAGNOSIS — Z86010 Personal history of colonic polyps: Secondary | ICD-10-CM | POA: Diagnosis not present

## 2018-09-18 DIAGNOSIS — K219 Gastro-esophageal reflux disease without esophagitis: Secondary | ICD-10-CM

## 2018-09-18 DIAGNOSIS — E559 Vitamin D deficiency, unspecified: Secondary | ICD-10-CM

## 2018-09-18 DIAGNOSIS — E663 Overweight: Secondary | ICD-10-CM

## 2018-09-18 DIAGNOSIS — Z79899 Other long term (current) drug therapy: Secondary | ICD-10-CM

## 2018-09-18 DIAGNOSIS — I679 Cerebrovascular disease, unspecified: Secondary | ICD-10-CM

## 2018-09-18 DIAGNOSIS — R6889 Other general symptoms and signs: Secondary | ICD-10-CM | POA: Diagnosis not present

## 2018-09-18 DIAGNOSIS — Z789 Other specified health status: Secondary | ICD-10-CM | POA: Diagnosis not present

## 2018-09-18 DIAGNOSIS — K760 Fatty (change of) liver, not elsewhere classified: Secondary | ICD-10-CM | POA: Diagnosis not present

## 2018-09-18 DIAGNOSIS — E782 Mixed hyperlipidemia: Secondary | ICD-10-CM | POA: Diagnosis not present

## 2018-09-18 DIAGNOSIS — G4733 Obstructive sleep apnea (adult) (pediatric): Secondary | ICD-10-CM | POA: Insufficient documentation

## 2018-09-18 DIAGNOSIS — E0590 Thyrotoxicosis, unspecified without thyrotoxic crisis or storm: Secondary | ICD-10-CM | POA: Diagnosis not present

## 2018-09-18 DIAGNOSIS — E1122 Type 2 diabetes mellitus with diabetic chronic kidney disease: Secondary | ICD-10-CM

## 2018-09-18 DIAGNOSIS — G4730 Sleep apnea, unspecified: Secondary | ICD-10-CM | POA: Insufficient documentation

## 2018-09-18 DIAGNOSIS — M1000 Idiopathic gout, unspecified site: Secondary | ICD-10-CM | POA: Diagnosis not present

## 2018-09-18 DIAGNOSIS — I10 Essential (primary) hypertension: Secondary | ICD-10-CM

## 2018-09-18 DIAGNOSIS — N183 Chronic kidney disease, stage 3 (moderate): Secondary | ICD-10-CM

## 2018-09-18 DIAGNOSIS — D649 Anemia, unspecified: Secondary | ICD-10-CM

## 2018-09-18 DIAGNOSIS — Z0001 Encounter for general adult medical examination with abnormal findings: Secondary | ICD-10-CM

## 2018-09-18 DIAGNOSIS — Z0000 Encounter for general adult medical examination without abnormal findings: Secondary | ICD-10-CM

## 2018-09-18 MED ORDER — BLOOD GLUCOSE MONITORING SUPPL DEVI: 0 refills | Status: DC

## 2018-09-18 MED ORDER — GLUCOSE BLOOD VI STRP: ORAL_STRIP | 12 refills | Status: DC

## 2018-09-21 ENCOUNTER — Other Ambulatory Visit: Payer: Self-pay

## 2018-09-21 MED ORDER — BLOOD GLUCOSE METER KIT: PACK | 0 refills | Status: DC

## 2018-10-07 DIAGNOSIS — G4733 Obstructive sleep apnea (adult) (pediatric): Secondary | ICD-10-CM | POA: Diagnosis not present

## 2018-10-12 ENCOUNTER — Telehealth: Payer: Self-pay

## 2018-10-12 ENCOUNTER — Other Ambulatory Visit: Payer: Self-pay

## 2018-10-12 NOTE — Telephone Encounter (Signed)
PATIENT has been informed to call her & her husband's insurance in able to ascertain what GLUCOSE meter they will pay for.  Patient agreed to call her insurance & will call our office back with the meter information.  May 11th 2020

## 2018-10-14 ENCOUNTER — Other Ambulatory Visit: Payer: Self-pay

## 2018-10-14 DIAGNOSIS — E1122 Type 2 diabetes mellitus with diabetic chronic kidney disease: Secondary | ICD-10-CM

## 2018-10-14 MED ORDER — BLOOD GLUCOSE MONITORING SUPPL DEVI: 0 refills | Status: DC

## 2018-10-14 MED ORDER — BLOOD GLUCOSE METER KIT: PACK | 0 refills | Status: DC

## 2018-10-14 NOTE — Telephone Encounter (Signed)
Meter Rx requested by patient

## 2018-11-07 DIAGNOSIS — G4733 Obstructive sleep apnea (adult) (pediatric): Secondary | ICD-10-CM | POA: Diagnosis not present

## 2018-11-10 ENCOUNTER — Other Ambulatory Visit: Payer: Self-pay | Admitting: Physician Assistant

## 2018-11-19 ENCOUNTER — Other Ambulatory Visit: Payer: Self-pay | Admitting: Internal Medicine

## 2018-11-19 DIAGNOSIS — E1122 Type 2 diabetes mellitus with diabetic chronic kidney disease: Secondary | ICD-10-CM

## 2018-11-19 DIAGNOSIS — N183 Chronic kidney disease, stage 3 unspecified: Secondary | ICD-10-CM

## 2018-11-19 MED ORDER — METFORMIN HCL 500 MG PO TABS: ORAL_TABLET | ORAL | 3 refills | Status: DC

## 2018-11-25 ENCOUNTER — Other Ambulatory Visit: Payer: Self-pay

## 2018-11-25 ENCOUNTER — Telehealth (INDEPENDENT_AMBULATORY_CARE_PROVIDER_SITE_OTHER): Payer: PPO

## 2018-11-25 DIAGNOSIS — Z79899 Other long term (current) drug therapy: Secondary | ICD-10-CM

## 2018-11-25 DIAGNOSIS — Z789 Other specified health status: Secondary | ICD-10-CM

## 2018-11-25 DIAGNOSIS — N183 Chronic kidney disease, stage 3 unspecified: Secondary | ICD-10-CM

## 2018-11-25 DIAGNOSIS — E1122 Type 2 diabetes mellitus with diabetic chronic kidney disease: Secondary | ICD-10-CM

## 2018-11-25 DIAGNOSIS — I10 Essential (primary) hypertension: Secondary | ICD-10-CM | POA: Diagnosis not present

## 2018-11-25 MED ORDER — GLUCOSE BLOOD VI STRP: ORAL_STRIP | 12 refills | Status: DC

## 2018-11-25 MED ORDER — LANCETS MISC: 11 refills | Status: DC

## 2018-11-25 MED ORDER — BLOOD GLUCOSE MONITORING SUPPL DEVI: 0 refills | Status: DC

## 2018-11-25 NOTE — Telephone Encounter (Signed)
CCM telephone visit  Medications reviewed/reconciled: Yes  Current Outpatient Medications (Endocrine & Metabolic):  .  metFORMIN (GLUCOPHAGE) 500 MG tablet, Take 1 tablet with Breakfast & Lunch and 2 tablets with supper for Diabetes  Current Outpatient Medications (Cardiovascular):  .  cloNIDine (CATAPRES) 0.2 MG tablet, Take 0.2 mg by mouth as needed. Takes if systolic BP is over 153. .  furosemide (LASIX) 40 MG tablet, Take 1 tablet (40 mg total) daily by mouth. .  minoxidil (LONITEN) 10 MG tablet, TAKE 1/2 (ONE-HALF) TABLET BY MOUTH  AT SUPPERTIME .  olmesartan (BENICAR) 40 MG tablet, Take 1 tablet Daily for BP .  rosuvastatin (CRESTOR) 20 MG tablet, TAKE 1 TABLET BY MOUTH ONCE DAILY AT  6PM .  verapamil (CALAN-SR) 240 MG CR tablet, Take 1 tablet (240 mg total) by mouth at bedtime.   Current Outpatient Medications (Analgesics):  .  aspirin 325 MG EC tablet, Take 325 mg by mouth daily.   Current Outpatient Medications (Other):  Marland Kitchen  ALPRAZolam (XANAX) 0.5 MG tablet, TAKE ONE HALF TO ONE TABLET ONCE TO TWICE DAILY. ONLY IF NEEDED FOR ANXIETY ATTACK AND LIMIT TO 5 DAYS PER WEEK TO AVOID ADDICTION .  blood glucose meter kit and supplies, Test sugar once a day. .  Blood Glucose Monitoring Suppl DEVI, Test sugars once daily.DX diabetes .  Cholecalciferol (VITAMIN D PO), Take 10,000 Units by mouth daily.  .  citalopram (CELEXA) 40 MG tablet, TAKE 1 TABLET BY MOUTH ONCE DAILY FOR ANXIETY AND  MOOD .  glucose blood test strip, Test sugar once a day DX diabetes .  Lancets (ONETOUCH ULTRASOFT) lancets, Check blood sugar 1 time daily-E11.22 .  Magnesium 500 MG TABS, Take 1 tablet by mouth 3 (three) times daily. .  magnesium hydroxide (MILK OF MAGNESIA) 400 MG/5ML suspension, Take by mouth daily as needed for mild constipation. .  polyethylene glycol (MIRALAX / GLYCOLAX) packet, Take 17 g by mouth daily. Marland Kitchen  senna (SENOKOT) 8.6 MG tablet, Take 1 tablet by mouth daily. Marland Kitchen  terbinafine (LAMISIL) 250  MG tablet, TAKE 1 TABLET BY MOUTH ONCE DAILY FOR  TOENAIL  FUNGUS  Refills needed: Called pharmacy previously  Health Logs: Checks BP regularly and checks blood sugar three times a day.  Goals Reviewed: Yes Goals    . Blood Pressure < 130/80     Try to get BP more consistent Monitor daily    . Exercise 150 min/wk Moderate Activity    . HEMOGLOBIN A1C < 7.0       Any patient concerns?:  Patient has cut back on Metformin to one tablet in the morning and one tablet in the evening. Feels that her Blood Sugar gets too low if she takes 4 tablets a day. Blood sugar readings when taking 4 tablets ranges from 90-100 and becomes shaky.  BP concern in the evenings, tends to get elevated: 180/87. Should her meds be adjusted? Currently takes 1/2 tablet of both Olmesartan Crestor, rather than the way it is prescribed.  Patient also states that she is only taking Celexa, 1 tablet every 2 weeks, trying to ween herself off. Zoloft, takes 1/2 tablet at bedtime along with 1/2 tablet of Xanax.   Plan: Will continue to monitor both blood sugar and blood pressure daily. 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      Time Spent: 30 minutes  Chancy Hurter, CMA

## 2018-11-26 ENCOUNTER — Other Ambulatory Visit: Payer: Self-pay

## 2018-11-26 DIAGNOSIS — E1122 Type 2 diabetes mellitus with diabetic chronic kidney disease: Secondary | ICD-10-CM

## 2018-11-26 MED ORDER — BLOOD GLUCOSE MONITORING SUPPL DEVI: 0 refills | Status: DC

## 2018-11-26 MED ORDER — LANCETS MISC: 11 refills | Status: DC

## 2018-11-26 MED ORDER — GLUCOSE BLOOD VI STRP: ORAL_STRIP | 12 refills | Status: DC

## 2018-11-26 NOTE — Telephone Encounter (Signed)
Patient notified and is scheduling FU appointment for BP

## 2018-12-01 ENCOUNTER — Encounter: Payer: Self-pay | Admitting: Internal Medicine

## 2018-12-01 DIAGNOSIS — G4733 Obstructive sleep apnea (adult) (pediatric): Secondary | ICD-10-CM | POA: Diagnosis not present

## 2018-12-07 DIAGNOSIS — G4733 Obstructive sleep apnea (adult) (pediatric): Secondary | ICD-10-CM | POA: Diagnosis not present

## 2018-12-09 NOTE — Progress Notes (Signed)
Assessment and Plan:  Michele [name] was seen today for follow-up and other.  Diagnoses and all orders for this visit:  Essential hypertension Reviewed meds at length to clarify; increase olmesartan to full tab, take in evening, continue all other medications as currently taking Monitor blood pressure at home; call if consistently over 130/80 Continue DASH diet.   Reminder to go to the ER if any CP, SOB, nausea, dizziness, severe HA, changes vision/speech, left arm numbness and tingling and jaw pain. -     olmesartan (BENICAR) 40 MG tablet; Take 1 tablet Daily in the evening for BP  Type 2 diabetes mellitus with stage 3 chronic kidney disease, without long-term current use of insulin (HCC) Aggressively controlled with metformin; tolerating transition off of XR to immediate release Education: Reviewed 'ABCs' of diabetes management (respective goals in parentheses):  A1C (<7), blood pressure (<130/80), and cholesterol (LDL <70) Eye Exam yearly and Dental Exam every 6 months. Dietary recommendations Physical Activity recommendations  Overweight (BMI 25.0-29.9) Long discussion about weight loss, diet, and exercise Recommended diet heavy in fruits and veggies and low in animal meats, cheeses, and dairy products, appropriate calorie intake Discussed appropriate weight for height  Follow up at next visit  Anxiety Take zoloft 50 mg daily; reviewed xanax use, discussed taper back to take PRN only, avoid taking daily for sleep, suggested she try melatonin, benadryl if needed Stress management techniques discussed, increase water, good sleep hygiene discussed, increase exercise, and increase veggies.   Enrolled in chronic care management  Comprehensive care plan:  Patient/caregiver was given comprehensive care plan We will continue to monitor these goals every 3 months with an office visit and every month by a telephone call  Patient can contact the office any time with the phone number and get to a  provider via the answering service or they can use Mychart.   Verbal permission was received from the patient to review comprehensive care management, they understand they have the right to stop CCM services at any time.   The patient is self managing medications at home.  Medications were reviewed with the patient today as well as adherence and potential interactions.   The patient does not need home health services at this time.    Further disposition pending results of labs. Discussed med's effects and SE's.   Over 30 minutes of exam, counseling, chart review, and critical decision making was performed.   Future Appointments  Date Time Provider [place]  01/12/2019 11:00 AM [name], MD [facility] None  09/28/2019 10:00 AM Vicie Mutters, PA-C [facility] None  12/13/2019  8:30 AM Ward Givens, NP [facility] None    ------------------------------------------------------------------------------------------------------------------   HPI BP (!) 142/80   Pulse 73   Temp 97.6 F (36.4 C)   Ht '5\' 3"'  (1.6 m)   Wt 157 lb 3.2 oz (71.3 kg)   SpO2 98%   BMI 27.85 kg/m   73 y.o.female presents for 2 week follow up after medication adjustment for htn; she has T2DM as well and is enrolled in CCM.   She was noted to be taking zoloft AND celexa at [facility] for anxiety, advised to stop 1, now taking zoloft 50 mg daily and feels she is doing well, she also takes alprazolam, takes 0.25 mg or 0.5 mg in the evenings.   She reported persistently elevated BPs at her last CCM phone call; olmesartan was increased to full 40 mg (she misunderstood and never increased), advised to continue verapamil 240 mg daily, minoxidil 10  mg daily ; she additionally is prescrbied clonidine 0.2 mg tabs to take PRN systolic 770+, lasix 40 mg daily PRN edema (she has not needed either).   Her blood pressure has not been controlled at home (140s/80s), today their BP is BP: (!) 142/80  She does workout. She  denies chest pain, shortness of breath, dizziness.   She has been working on diet and exercise for T2 diabetes aggressively controlled with metformin, transitioned to immediate release after XR recall and tolerating without concerns, and denies increased appetite, nausea, paresthesia of the feet, polydipsia, polyuria, visual disturbances and vomiting. She does have constipation concerning for slow transit. Last A1C in the office was:  Lab Results  Component Value Date   HGBA1C 5.7 (H) 05/25/2018   BMI is Body mass index is 27.85 kg/m., she has been working on diet and exercise. Wt Readings from Last 3 Encounters:  12/10/18 157 lb 3.2 oz (71.3 kg)  12/10/18 158 lb (71.7 kg)  09/18/18 146 lb (66.2 kg)     Past Medical History:  Diagnosis Date  . Anemia   . Anxiety   . Arthritis   . Carpal tunnel syndrome   . Cerebrovascular disease 01/02/2017  . Duodenal diverticulum   . Esophageal motility disorder   . Fatty liver   . GERD (gastroesophageal reflux disease)   . Hyperlipidemia   . Hypertension   . 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 Sleep apnea   . Tubulovillous adenoma of colon   . Type II or unspecified type diabetes mellitus without mention of complication, not stated as uncontrolled      Allergies  Allergen Reactions  . Lipitor [Atorvastatin]     Elevates LFT's    Current Outpatient Medications on File Prior to Visit  Medication Sig  . ALPRAZolam (XANAX) 0.5 MG tablet TAKE ONE HALF TO ONE TABLET ONCE TO TWICE DAILY. ONLY IF NEEDED FOR ANXIETY ATTACK AND LIMIT TO 5 DAYS PER WEEK TO AVOID ADDICTION  . aspirin 325 MG EC tablet Take 325 mg by mouth daily.  . blood glucose meter kit and supplies Test sugar once a day.  . Blood Glucose Monitoring Suppl DEVI Test sugars once daily.DX diabetes  . Cholecalciferol (VITAMIN D PO) Take 10,000 Units by mouth daily.   . cloNIDine (CATAPRES) 0.2 MG tablet Take 0.2 mg by mouth as needed. Takes if systolic BP is over 340.  . glucose blood  test strip Test sugar once a day DX diabetes  . Lancets MISC Test blood sugar once daily  . Magnesium 500 MG TABS Take 1 tablet by mouth 3 (three) times daily.  . magnesium hydroxide (MILK OF MAGNESIA) 400 MG/5ML suspension Take by mouth daily as needed for mild constipation.  . metFORMIN (GLUCOPHAGE) 500 MG tablet Take 1 tablet with Breakfast & Lunch and 2 tablets with supper for Diabetes (Patient taking differently: Take 1 tablet with Breakfast & Lunch and 2 tablets with supper for Diabetes. Quick release)  . minoxidil (LONITEN) 10 MG tablet TAKE 1/2 (ONE-HALF) TABLET BY MOUTH  AT SUPPERTIME  . polyethylene glycol (MIRALAX / GLYCOLAX) packet Take 17 g by mouth daily.  . rosuvastatin (CRESTOR) 20 MG tablet TAKE 1 TABLET BY MOUTH ONCE DAILY AT  6PM  . senna (SENOKOT) 8.6 MG tablet Take 1 tablet by mouth daily.  . sertraline (ZOLOFT) 50 MG tablet TAKE 1 TABLET BY MOUTH ONCE DAILY FOR MOOD  . terbinafine (LAMISIL) 250 MG tablet TAKE 1 TABLET BY MOUTH ONCE DAILY FOR  TOENAIL  FUNGUS  . verapamil (CALAN-SR) 240 MG CR tablet Take 1 tablet (240 mg total) by mouth at bedtime.   No current facility-administered medications on file prior to visit.     ROS: all negative except above.   Physical Exam:  BP (!) 142/80   Pulse 73   Temp 97.6 F (36.4 C)   Ht '5\' 3"'  (1.6 m)   Wt 157 lb 3.2 oz (71.3 kg)   SpO2 98%   BMI 27.85 kg/m   General Appearance: Well nourished, in no apparent distress. Eyes: conjunctiva no swelling or erythema ENT/Mouth: No erythema, swelling, or exudate on post pharynx.  Tonsils not swollen or erythematous. Hearing normal.  Neck: Supple Respiratory: Respiratory effort normal, BS equal bilaterally without rales, rhonchi, wheezing or stridor.  Cardio: RRR with no MRGs. Brisk peripheral pulses without edema.  Abdomen: Soft, + BS.  Non tender, no guarding, rebound, hernias, masses. Lymphatics: Non tender without lymphadenopathy.  Musculoskeletal: Symmetrical strength, normal  gait.  Skin: Warm, dry without rashes, lesions, ecchymosis.  Neuro: Cranial nerves intact. Normal muscle tone, no cerebellar symptoms. Sensation intact.  Psych: Awake and oriented X 3, normal affect, Insight and Judgment appropriate.     Izora Ribas, NP 11:24 AM [facility]

## 2018-12-10 ENCOUNTER — Other Ambulatory Visit: Payer: Self-pay

## 2018-12-10 ENCOUNTER — Ambulatory Visit (INDEPENDENT_AMBULATORY_CARE_PROVIDER_SITE_OTHER): Payer: PPO | Admitting: Adult Health

## 2018-12-10 ENCOUNTER — Encounter: Payer: Self-pay | Admitting: Adult Health

## 2018-12-10 VITALS — BP 142/80 | HR 73 | Temp 97.6°F | Ht 63.0 in | Wt 157.2 lb

## 2018-12-10 VITALS — BP 191/80 | HR 73 | Temp 97.8°F | Ht 63.0 in | Wt 158.0 lb

## 2018-12-10 DIAGNOSIS — Z9989 Dependence on other enabling machines and devices: Secondary | ICD-10-CM

## 2018-12-10 DIAGNOSIS — E663 Overweight: Secondary | ICD-10-CM

## 2018-12-10 DIAGNOSIS — F418 Other specified anxiety disorders: Secondary | ICD-10-CM | POA: Insufficient documentation

## 2018-12-10 DIAGNOSIS — N183 Chronic kidney disease, stage 3 (moderate): Secondary | ICD-10-CM | POA: Diagnosis not present

## 2018-12-10 DIAGNOSIS — I10 Essential (primary) hypertension: Secondary | ICD-10-CM | POA: Diagnosis not present

## 2018-12-10 DIAGNOSIS — Z789 Other specified health status: Secondary | ICD-10-CM | POA: Diagnosis not present

## 2018-12-10 DIAGNOSIS — F419 Anxiety disorder, unspecified: Secondary | ICD-10-CM | POA: Insufficient documentation

## 2018-12-10 DIAGNOSIS — G4733 Obstructive sleep apnea (adult) (pediatric): Secondary | ICD-10-CM | POA: Diagnosis not present

## 2018-12-10 DIAGNOSIS — E1122 Type 2 diabetes mellitus with diabetic chronic kidney disease: Secondary | ICD-10-CM | POA: Diagnosis not present

## 2018-12-10 MED ORDER — OLMESARTAN MEDOXOMIL 40 MG PO TABS: ORAL_TABLET | ORAL | 3 refills | Status: DC

## 2018-12-10 NOTE — Patient Instructions (Addendum)
Goals     Blood Pressure < 130/80     Try to get BP more consistent Monitor daily     Exercise 150 min/wk Moderate Activity     HEMOGLOBIN A1C < 7.0      HYPERTENSION INFORMATION  Monitor your blood pressure at home, please keep a record and bring that in with you to your next office visit.   Go to the ER if any CP, SOB, nausea, dizziness, severe HA, changes vision/speech  Testing/Procedures: HOW TO TAKE YOUR BLOOD PRESSURE:  Rest 5 minutes before taking your blood pressure.  Dont smoke or drink caffeinated beverages for at least 30 minutes before.  Take your blood pressure before (not after) you eat.  Sit comfortably with your back supported and both feet on the floor (dont cross your legs).  Elevate your arm to heart level on a table or a desk.  Use the proper sized cuff. It should fit smoothly and snugly around your bare upper arm. There should be enough room to slip a fingertip under the cuff. The bottom edge of the cuff should be 1 inch above the crease of the elbow.  There was another study that showed taking your blood pressure medications at night decrease cardiovascular events.  However if you are on a fluid pill, please take this in the morning.   Your most recent BP: BP: (!) 142/80   Take your medications faithfully as instructed. Maintain a healthy weight. Get at least 150 minutes of aerobic exercise per week. Minimize salt intake. Minimize alcohol intake  DASH Eating Plan DASH stands for "Dietary Approaches to Stop Hypertension." The DASH eating plan is a healthy eating plan that has been shown to reduce high blood pressure (hypertension). Additional health benefits may include reducing the risk of type 2 diabetes mellitus, heart disease, and stroke. The DASH eating plan may also help with weight loss. WHAT DO I NEED TO KNOW ABOUT THE DASH EATING PLAN? For the DASH eating plan, you will follow these general guidelines:  Choose foods with a percent daily  value for sodium of less than 5% (as listed on the food label).  Use salt-free seasonings or herbs instead of table salt or sea salt.  Check with your health care provider or pharmacist before using salt substitutes.  Eat lower-sodium products, often labeled as "lower sodium" or "no salt added."  Eat fresh foods.  Eat more vegetables, fruits, and low-fat dairy products.  Choose whole grains. Look for the word "whole" as the first word in the ingredient list.  Choose fish and skinless chicken or [place] more often than red meat. Limit fish, poultry, and meat to 6 oz (170 g) each day.  Limit sweets, desserts, sugars, and sugary drinks.  Choose heart-healthy fats.  Limit cheese to 1 oz (28 g) per day.  Eat more home-cooked food and less restaurant, buffet, and fast food.  Limit fried foods.  Cook foods using methods other than frying.  Limit canned vegetables. If you do use them, rinse them well to decrease the sodium.  When eating at a restaurant, ask that your food be prepared with less salt, or no salt if possible. WHAT FOODS CAN I EAT? Seek help from a dietitian for individual calorie needs. Grains Whole grain or whole wheat bread. Brown rice. Whole grain or whole wheat pasta. Quinoa, bulgur, and whole grain cereals. Low-sodium cereals. Corn or whole wheat flour tortillas. Whole grain cornbread. Whole grain crackers. Low-sodium crackers. Vegetables Fresh or frozen vegetables (  raw, steamed, roasted, or grilled). Low-sodium or reduced-sodium tomato and vegetable juices. Low-sodium or reduced-sodium tomato sauce and paste. Low-sodium or reduced-sodium canned vegetables.  Fruits All fresh, canned (in natural juice), or frozen fruits. Meat and Other Protein Products Ground beef (85% or leaner), grass-fed beef, or beef trimmed of fat. Skinless chicken or [place]. Ground chicken or [place]. Pork trimmed of fat. All fish and seafood. Eggs. Dried beans, peas, or lentils. Unsalted nuts  and seeds. Unsalted canned beans. Dairy Low-fat dairy products, such as skim or 1% milk, 2% or reduced-fat cheeses, low-fat ricotta or cottage cheese, or plain low-fat yogurt. Low-sodium or reduced-sodium cheeses. Fats and Oils Tub margarines without trans fats. Light or reduced-fat mayonnaise and salad dressings (reduced sodium). Avocado. Safflower, olive, or canola oils. Natural peanut or almond butter. Other Unsalted popcorn and pretzels. The items listed above may not be a complete list of recommended foods or beverages. Contact your dietitian for more options. WHAT FOODS ARE NOT RECOMMENDED? Grains White bread. White pasta. White rice. Refined cornbread. Bagels and croissants. Crackers that contain trans fat. Vegetables Creamed or fried vegetables. Vegetables in a cheese sauce. Regular canned vegetables. Regular canned tomato sauce and paste. Regular tomato and vegetable juices. Fruits Dried fruits. Canned fruit in light or heavy syrup. Fruit juice. Meat and Other Protein Products Fatty cuts of meat. Ribs, chicken wings, bacon, sausage, bologna, salami, chitterlings, fatback, hot dogs, bratwurst, and packaged luncheon meats. Salted nuts and seeds. Canned beans with salt. Dairy Whole or 2% milk, cream, half-and-half, and cream cheese. Whole-fat or sweetened yogurt. Full-fat cheeses or blue cheese. Nondairy creamers and whipped toppings. Processed cheese, cheese spreads, or cheese curds. Condiments Onion and garlic salt, seasoned salt, table salt, and sea salt. Canned and packaged gravies. Worcestershire sauce. Tartar sauce. Barbecue sauce. Teriyaki sauce. Soy sauce, including reduced sodium. Steak sauce. Fish sauce. Oyster sauce. Cocktail sauce. Horseradish. Ketchup and mustard. Meat flavorings and tenderizers. Bouillon cubes. Hot sauce. Tabasco sauce. Marinades. Taco seasonings. Relishes. Fats and Oils Butter, stick margarine, lard, shortening, ghee, and bacon fat. Coconut, palm kernel, or  palm oils. Regular salad dressings. Other Pickles and olives. Salted popcorn and pretzels. The items listed above may not be a complete list of foods and beverages to avoid. Contact your dietitian for more information. WHERE CAN I FIND MORE INFORMATION? National Heart, Lung, and Blood Institute: travelstabloid.com Document Released: 05/09/2011 Document Revised: 10/04/2013 Document Reviewed: 03/24/2013 [facility] Patient Information 2015 [place], [place]. This information is not intended to replace advice given to you by your health care provider. Make sure you discuss any questions you have with your health care provider.    Gastroparesis  Gastroparesis is a condition in which food takes longer than normal to empty from the stomach. The condition is usually long-lasting (chronic). It may also be called delayed gastric emptying. There is no cure, but there are treatments and things that you can do at home to help relieve symptoms. Treating the underlying condition that causes gastroparesis can also help relieve symptoms. What are the causes? In many cases, the cause of this condition is not known. Possible causes include:  A hormone (endocrine) disorder, such as hypothyroidism or diabetes.  A nervous system disease, such as Parkinson's disease or multiple sclerosis.  Cancer, infection, or surgery that affects the stomach or vagus nerve. The vagus nerve runs from your chest, through your neck, to the lower part of your brain.  A connective tissue disorder, such as scleroderma.  Certain medicines. What increases the risk?  You are more likely to develop this condition if you:  Have certain disorders or diseases, including: ? An endocrine disorder. ? An eating disorder. ? Amyloidosis. ? Scleroderma. ? Parkinson's disease. ? Multiple sclerosis. ? Cancer or infection of the stomach or the vagus nerve.  Have had surgery on the stomach or vagus  nerve.  Take certain medicines.  Are female. What are the signs or symptoms? Symptoms of this condition include:  Feeling full after eating very little.  Nausea.  Vomiting.  Heartburn.  Abdominal bloating.  Inconsistent blood sugar (glucose) levels on blood tests.  Lack of appetite.  Weight loss.  Acid from the stomach coming up into the esophagus (gastroesophageal reflux).  Sudden tightening (spasm) of the stomach, which can be painful. Symptoms may come and go. Some people may not notice any symptoms. How is this diagnosed? This condition is diagnosed with tests, such as:  Tests that check how long it takes food to move through the stomach and intestines. These tests include: ? Upper gastrointestinal (GI) series. For this test, you drink a liquid that shows up well on X-rays, and then X-rays will be taken of your intestines. ? Gastric emptying scintigraphy. For this test, you eat food that contains a small amount of radioactive material, and then scans are taken. ? Wireless capsule GI monitoring system. For this test, you swallow a pill (capsule) that records information about how foods and fluid move through your stomach.  Gastric manometry. For this test, a tube is passed down your throat and into your stomach to measure electrical and muscular activity.  Endoscopy. For this test, a long, thin tube is passed down your throat and into your stomach to check for problems in your stomach lining.  Ultrasound. This test uses sound waves to create images of inside the body. This can help rule out gallbladder disease or pancreatitis as a cause of your symptoms. How is this treated? There is no cure for gastroparesis. Treatment may include:  Treating the underlying cause.  Managing your symptoms by making changes to your diet and exercise habits.  Taking medicines to control nausea and vomiting and to stimulate stomach muscles.  Getting food through a feeding tube in the  hospital. This may be done in severe cases.  Having surgery to insert a device into your body that helps improve stomach emptying and control nausea and vomiting (gastric neurostimulator). Follow these instructions at home:  Take over-the-counter and prescription medicines only as told by your health care provider.  Follow instructions from your health care provider about eating or drinking restrictions. Your health care provider may recommend that you: ? Eat smaller meals more often. ? Eat low-fat foods. ? Eat low-fiber forms of high-fiber foods. For example, eat cooked vegetables instead of raw vegetables. ? Have only liquid foods instead of solid foods. Liquid foods are easier to digest.  Drink enough fluid to keep your urine pale yellow.  Exercise as often as told by your health care provider.  Keep all follow-up visits as told by your health care provider. This is important. Contact a health care provider if you:  Notice that your symptoms do not improve with treatment.  Have new symptoms. Get help right away if you:  Have severe abdominal pain that does not improve with treatment.  Have nausea that is severe or does not go away.  Cannot drink fluids without vomiting. Summary  Gastroparesis is a chronic condition in which food takes longer than normal to empty from  the stomach.  Symptoms include nausea, vomiting, heartburn, abdominal bloating, and loss of appetite.  Eating smaller portions, and low-fat, low-fiber foods may help you manage your symptoms.  Get help right away if you have severe abdominal pain. This information is not intended to replace advice given to you by your health care provider. Make sure you discuss any questions you have with your health care provider. Document Released: 05/20/2005 Document Revised: 08/18/2017 Document Reviewed: 03/25/2017 Elsevier Patient Education  2020 [employer].

## 2018-12-10 NOTE — Progress Notes (Signed)
  PATIENT: Michele [name] DOB: 11/01/1945  REASON FOR VISIT: follow up HISTORY FROM: patient  HISTORY OF PRESENT ILLNESS: Today 12/10/18 :  Michele [name] is a 73-year-old female with a history of obstructive sleep apnea on CPAP.  Her download indicates that she use her machine nightly for compliance of 100%.  She used her machine greater than 4 hours 29 days for compliance of 97%.  On average she uses her machine 6 hours and 8 minutes.  Her residual AHI is 2.5 on 5-10 cmH2O with EPR of 3.  Her leak in the 95th percentile is 27.6 L/min.  She reports that some mornings she will wake up with a headache however she feels that this is irritation from wearing the mask and straps.  She reports that she is not interested in changing the style of mask at this time.  HISTORY 04/09/18:  Michele [name] is a 73-year-old female with a history of obstructive sleep apnea on CPAP.  Her CPAP download indicates that she use her machine 29 out of 30 days for compliance of 97%.  She used her machine greater than 4 hours 28 days for compliance of 93%.  On average she uses her machine 6 hours and 13 minutes.  Her residual AHI is 2.4 on 5 to 10 cm of water with EPR of 3.  She does not have a significant leak.  She reports that she is not sure that she likes that heated air.  She reports that she has been taking.  She returns today for follow-up.  REVIEW OF SYSTEMS: Out of a complete 14 system review of symptoms, the patient complains only of the following symptoms, and all other reviewed systems are negative.  ALLERGIES: Allergies  Allergen Reactions  . Lipitor [Atorvastatin]     Elevates LFT's    HOME MEDICATIONS: Outpatient Medications Prior to Visit  Medication Sig Dispense Refill  . ALPRAZolam (XANAX) 0.5 MG tablet TAKE ONE HALF TO ONE TABLET ONCE TO TWICE DAILY. ONLY IF NEEDED FOR ANXIETY ATTACK AND LIMIT TO 5 DAYS PER WEEK TO AVOID ADDICTION 60 tablet 0  . aspirin 325 MG EC tablet Take 325 mg by mouth  daily.    . blood glucose meter kit and supplies Test sugar once a day. 1 each 0  . Blood Glucose Monitoring Suppl DEVI Test sugars once daily.DX diabetes 1 each 0  . Cholecalciferol (VITAMIN D PO) Take 10,000 Units by mouth daily.     . cloNIDine (CATAPRES) 0.2 MG tablet Take 0.2 mg by mouth as needed. Takes if systolic BP is over 160.    . furosemide (LASIX) 40 MG tablet Take 1 tablet (40 mg total) daily by mouth. 90 tablet 99  . glucose blood test strip Test sugar once a day DX diabetes 100 each 12  . Lancets MISC Test blood sugar once daily 100 each 11  . Magnesium 500 MG TABS Take 1 tablet by mouth 3 (three) times daily.    . magnesium hydroxide (MILK OF MAGNESIA) 400 MG/5ML suspension Take by mouth daily as needed for mild constipation.    . metFORMIN (GLUCOPHAGE) 500 MG tablet Take 1 tablet with Breakfast & Lunch and 2 tablets with supper for Diabetes 360 tablet 3  . minoxidil (LONITEN) 10 MG tablet TAKE 1/2 (ONE-HALF) TABLET BY MOUTH  AT SUPPERTIME 90 tablet 0  . olmesartan (BENICAR) 40 MG tablet Take 1 tablet Daily for BP 90 tablet 3  . polyethylene glycol (MIRALAX / GLYCOLAX) packet Take 17   PATIENT: Vanisha Whiten [name] DOB: 1946/02/18  REASON FOR VISIT: follow up HISTORY FROM: patient  HISTORY OF PRESENT ILLNESS: Today 12/10/18 :  Michele [name] is a 73 year old female with a history of obstructive sleep apnea on CPAP.  Her download indicates that she use her machine nightly for compliance of 100%.  She used her machine greater than 4 hours 29 days for compliance of 97%.  On average she uses her machine 6 hours and 8 minutes.  Her residual AHI is 2.5 on 5-10 cmH2O with EPR of 3.  Her leak in the 95th percentile is 27.6 L/min.  She reports that some mornings she will wake up with a headache however she feels that this is irritation from wearing the mask and straps.  She reports that she is not interested in changing the style of mask at this time.  HISTORY 04/09/18:  Michele [name] is a 73 year old female with a history of obstructive sleep apnea on CPAP.  Her CPAP download indicates that she use her machine 29 out of 30 days for compliance of 97%.  She used her machine greater than 4 hours 28 days for compliance of 93%.  On average she uses her machine 6 hours and 13 minutes.  Her residual AHI is 2.4 on 5 to 10 cm of water with EPR of 3.  She does not have a significant leak.  She reports that she is not sure that she likes that heated air.  She reports that she has been taking.  She returns today for follow-up.  REVIEW OF SYSTEMS: Out of a complete 14 system review of symptoms, the patient complains only of the following symptoms, and all other reviewed systems are negative.  ALLERGIES: Allergies  Allergen Reactions  . Lipitor [Atorvastatin]     Elevates LFT's    HOME MEDICATIONS: Outpatient Medications Prior to Visit  Medication Sig Dispense Refill  . ALPRAZolam (XANAX) 0.5 MG tablet TAKE ONE HALF TO ONE TABLET ONCE TO TWICE DAILY. ONLY IF NEEDED FOR ANXIETY ATTACK AND LIMIT TO 5 DAYS PER WEEK TO AVOID ADDICTION 60 tablet 0  . aspirin 325 MG EC tablet Take 325 mg by mouth  daily.    . blood glucose meter kit and supplies Test sugar once a day. 1 each 0  . Blood Glucose Monitoring Suppl DEVI Test sugars once daily.DX diabetes 1 each 0  . Cholecalciferol (VITAMIN D PO) Take 10,000 Units by mouth daily.     . cloNIDine (CATAPRES) 0.2 MG tablet Take 0.2 mg by mouth as needed. Takes if systolic BP is over 419.    . furosemide (LASIX) 40 MG tablet Take 1 tablet (40 mg total) daily by mouth. 90 tablet 99  . glucose blood test strip Test sugar once a day DX diabetes 100 each 12  . Lancets MISC Test blood sugar once daily 100 each 11  . Magnesium 500 MG TABS Take 1 tablet by mouth 3 (three) times daily.    . magnesium hydroxide (MILK OF MAGNESIA) 400 MG/5ML suspension Take by mouth daily as needed for mild constipation.    . metFORMIN (GLUCOPHAGE) 500 MG tablet Take 1 tablet with Breakfast & Lunch and 2 tablets with supper for Diabetes 360 tablet 3  . minoxidil (LONITEN) 10 MG tablet TAKE 1/2 (ONE-HALF) TABLET BY MOUTH  AT SUPPERTIME 90 tablet 0  . olmesartan (BENICAR) 40 MG tablet Take 1 tablet Daily for BP 90 tablet 3  . polyethylene glycol (MIRALAX / GLYCOLAX) packet Take 17  PATIENT: Vanisha Whiten [name] DOB: 1946/02/18  REASON FOR VISIT: follow up HISTORY FROM: patient  HISTORY OF PRESENT ILLNESS: Today 12/10/18 :  Michele [name] is a 73 year old female with a history of obstructive sleep apnea on CPAP.  Her download indicates that she use her machine nightly for compliance of 100%.  She used her machine greater than 4 hours 29 days for compliance of 97%.  On average she uses her machine 6 hours and 8 minutes.  Her residual AHI is 2.5 on 5-10 cmH2O with EPR of 3.  Her leak in the 95th percentile is 27.6 L/min.  She reports that some mornings she will wake up with a headache however she feels that this is irritation from wearing the mask and straps.  She reports that she is not interested in changing the style of mask at this time.  HISTORY 04/09/18:  Michele [name] is a 73 year old female with a history of obstructive sleep apnea on CPAP.  Her CPAP download indicates that she use her machine 29 out of 30 days for compliance of 97%.  She used her machine greater than 4 hours 28 days for compliance of 93%.  On average she uses her machine 6 hours and 13 minutes.  Her residual AHI is 2.4 on 5 to 10 cm of water with EPR of 3.  She does not have a significant leak.  She reports that she is not sure that she likes that heated air.  She reports that she has been taking.  She returns today for follow-up.  REVIEW OF SYSTEMS: Out of a complete 14 system review of symptoms, the patient complains only of the following symptoms, and all other reviewed systems are negative.  ALLERGIES: Allergies  Allergen Reactions  . Lipitor [Atorvastatin]     Elevates LFT's    HOME MEDICATIONS: Outpatient Medications Prior to Visit  Medication Sig Dispense Refill  . ALPRAZolam (XANAX) 0.5 MG tablet TAKE ONE HALF TO ONE TABLET ONCE TO TWICE DAILY. ONLY IF NEEDED FOR ANXIETY ATTACK AND LIMIT TO 5 DAYS PER WEEK TO AVOID ADDICTION 60 tablet 0  . aspirin 325 MG EC tablet Take 325 mg by mouth  daily.    . blood glucose meter kit and supplies Test sugar once a day. 1 each 0  . Blood Glucose Monitoring Suppl DEVI Test sugars once daily.DX diabetes 1 each 0  . Cholecalciferol (VITAMIN D PO) Take 10,000 Units by mouth daily.     . cloNIDine (CATAPRES) 0.2 MG tablet Take 0.2 mg by mouth as needed. Takes if systolic BP is over 419.    . furosemide (LASIX) 40 MG tablet Take 1 tablet (40 mg total) daily by mouth. 90 tablet 99  . glucose blood test strip Test sugar once a day DX diabetes 100 each 12  . Lancets MISC Test blood sugar once daily 100 each 11  . Magnesium 500 MG TABS Take 1 tablet by mouth 3 (three) times daily.    . magnesium hydroxide (MILK OF MAGNESIA) 400 MG/5ML suspension Take by mouth daily as needed for mild constipation.    . metFORMIN (GLUCOPHAGE) 500 MG tablet Take 1 tablet with Breakfast & Lunch and 2 tablets with supper for Diabetes 360 tablet 3  . minoxidil (LONITEN) 10 MG tablet TAKE 1/2 (ONE-HALF) TABLET BY MOUTH  AT SUPPERTIME 90 tablet 0  . olmesartan (BENICAR) 40 MG tablet Take 1 tablet Daily for BP 90 tablet 3  . polyethylene glycol (MIRALAX / GLYCOLAX) packet Take 17    PATIENT: Michele [name] DOB: 11/01/1945  REASON FOR VISIT: follow up HISTORY FROM: patient  HISTORY OF PRESENT ILLNESS: Today 12/10/18 :  Michele [name] is a 73-year-old female with a history of obstructive sleep apnea on CPAP.  Her download indicates that she use her machine nightly for compliance of 100%.  She used her machine greater than 4 hours 29 days for compliance of 97%.  On average she uses her machine 6 hours and 8 minutes.  Her residual AHI is 2.5 on 5-10 cmH2O with EPR of 3.  Her leak in the 95th percentile is 27.6 L/min.  She reports that some mornings she will wake up with a headache however she feels that this is irritation from wearing the mask and straps.  She reports that she is not interested in changing the style of mask at this time.  HISTORY 04/09/18:  Michele [name] is a 73-year-old female with a history of obstructive sleep apnea on CPAP.  Her CPAP download indicates that she use her machine 29 out of 30 days for compliance of 97%.  She used her machine greater than 4 hours 28 days for compliance of 93%.  On average she uses her machine 6 hours and 13 minutes.  Her residual AHI is 2.4 on 5 to 10 cm of water with EPR of 3.  She does not have a significant leak.  She reports that she is not sure that she likes that heated air.  She reports that she has been taking.  She returns today for follow-up.  REVIEW OF SYSTEMS: Out of a complete 14 system review of symptoms, the patient complains only of the following symptoms, and all other reviewed systems are negative.  ALLERGIES: Allergies  Allergen Reactions  . Lipitor [Atorvastatin]     Elevates LFT's    HOME MEDICATIONS: Outpatient Medications Prior to Visit  Medication Sig Dispense Refill  . ALPRAZolam (XANAX) 0.5 MG tablet TAKE ONE HALF TO ONE TABLET ONCE TO TWICE DAILY. ONLY IF NEEDED FOR ANXIETY ATTACK AND LIMIT TO 5 DAYS PER WEEK TO AVOID ADDICTION 60 tablet 0  . aspirin 325 MG EC tablet Take 325 mg by mouth  daily.    . blood glucose meter kit and supplies Test sugar once a day. 1 each 0  . Blood Glucose Monitoring Suppl DEVI Test sugars once daily.DX diabetes 1 each 0  . Cholecalciferol (VITAMIN D PO) Take 10,000 Units by mouth daily.     . cloNIDine (CATAPRES) 0.2 MG tablet Take 0.2 mg by mouth as needed. Takes if systolic BP is over 160.    . furosemide (LASIX) 40 MG tablet Take 1 tablet (40 mg total) daily by mouth. 90 tablet 99  . glucose blood test strip Test sugar once a day DX diabetes 100 each 12  . Lancets MISC Test blood sugar once daily 100 each 11  . Magnesium 500 MG TABS Take 1 tablet by mouth 3 (three) times daily.    . magnesium hydroxide (MILK OF MAGNESIA) 400 MG/5ML suspension Take by mouth daily as needed for mild constipation.    . metFORMIN (GLUCOPHAGE) 500 MG tablet Take 1 tablet with Breakfast & Lunch and 2 tablets with supper for Diabetes 360 tablet 3  . minoxidil (LONITEN) 10 MG tablet TAKE 1/2 (ONE-HALF) TABLET BY MOUTH  AT SUPPERTIME 90 tablet 0  . olmesartan (BENICAR) 40 MG tablet Take 1 tablet Daily for BP 90 tablet 3  . polyethylene glycol (MIRALAX / GLYCOLAX) packet Take 17

## 2018-12-10 NOTE — Patient Instructions (Signed)
Continue using CPAP nightly and greater than 4 hours each night °If your symptoms worsen or you develop new symptoms please let us know.  ° °

## 2018-12-29 DIAGNOSIS — L603 Nail dystrophy: Secondary | ICD-10-CM | POA: Diagnosis not present

## 2018-12-29 DIAGNOSIS — L814 Other melanin hyperpigmentation: Secondary | ICD-10-CM | POA: Diagnosis not present

## 2018-12-29 DIAGNOSIS — L80 Vitiligo: Secondary | ICD-10-CM | POA: Diagnosis not present

## 2018-12-29 DIAGNOSIS — D225 Melanocytic nevi of trunk: Secondary | ICD-10-CM | POA: Diagnosis not present

## 2018-12-29 DIAGNOSIS — L570 Actinic keratosis: Secondary | ICD-10-CM | POA: Diagnosis not present

## 2019-01-07 DIAGNOSIS — G4733 Obstructive sleep apnea (adult) (pediatric): Secondary | ICD-10-CM | POA: Diagnosis not present

## 2019-01-11 ENCOUNTER — Encounter: Payer: Self-pay | Admitting: Internal Medicine

## 2019-01-11 NOTE — Progress Notes (Signed)
Annual Screening/Preventative Visit & Comprehensive Evaluation &  Examination     This very nice 73 y.o. [name]  presents for a Screening /Preventative Visit & comprehensive evaluation and management of multiple medical co-morbidities.  Patient has been followed for HTN, HLD, T2_NIDDM and Vitamin D Deficiency. Patient has hx/o OSA and was mask intolerant      HTN predates since 1978. In 2014, Renal Aa Duplex was negative.  Patient's BP has been controlled at home. Myoview in 2-017 was negative.  Patient denies any cardiac symptoms as chest pain, palpitations, shortness of breath, dizziness or ankle swelling. Today's BP - 130/82.      Patient's hyperlipidemia is controlled with diet and medications. Patient denies myalgias or other medication SE's. 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 Last A1c was near goal: Lab Results  Component Value Date   HGBA1C 5.7 (H) 05/25/2018      Finally, patient has history of Vitamin D Deficiency("19" / 2008) and last Vitamin D was at goal: Lab Results  Component Value Date   VD25OH 63 05/25/2018   Current Outpatient Medications on File Prior to Visit  Medication Sig  . ALPRAZolam (XANAX) 0.5 MG tablet TAKE ONE HALF TO ONE TABLET ONCE TO TWICE DAILY. ONLY IF NEEDED FOR ANXIETY ATTACK AND LIMIT TO 5 DAYS PER WEEK TO AVOID ADDICTION  . APPLE CIDER VINEGAR PO Take by mouth. Takes daily.  Marland Kitchen aspirin 325 MG EC tablet Take 325 mg by mouth daily.  Marland Kitchen BIOTIN PO Take 1 tablet by mouth daily.  . blood glucose meter kit and supplies Test sugar once a day.  . Blood Glucose Monitoring Suppl DEVI Test sugars once daily.DX diabetes  . Cholecalciferol (VITAMIN D PO)  Take 10,000 Units by mouth daily.   . Cinnamon 500 MG capsule Take 500 mg by mouth daily.  . cloNIDine (CATAPRES) 0.2 MG tablet Take 0.2 mg by mouth as needed. Takes if systolic BP is over 888.  Marland Kitchen Cyanocobalamin (VITAMIN B-12 PO) Take 1 tablet by mouth daily.  Marland Kitchen glucose blood test strip Test sugar once a day DX diabetes  . Lancets MISC Test blood sugar once daily  . Magnesium 500 MG TABS Take 1 tablet by mouth 3 (three) times daily.  . magnesium hydroxide (MILK OF MAGNESIA) 400 MG/5ML suspension Take by mouth daily as needed for mild constipation.  . metFORMIN (GLUCOPHAGE) 500 MG tablet Take 1 tablet with Breakfast & Lunch and 2 tablets with supper for Diabetes (Patient taking differently: Take 1 tablet with Breakfast & Lunch and 2 tablets with supper for Diabetes. Quick release)  . minoxidil (LONITEN) 10 MG tablet TAKE 1/2 (ONE-HALF) TABLET BY MOUTH  AT SUPPERTIME  . olmesartan (BENICAR) 40 MG tablet Take 1 tablet Daily in the evening for BP  . Omega-3 Fatty Acids (FISH OIL PO) Take 1 capsule by mouth daily.  . polyethylene glycol (MIRALAX / GLYCOLAX) packet Take 17 g by mouth daily.  . rosuvastatin (CRESTOR) 20 MG tablet TAKE 1 TABLET BY MOUTH ONCE DAILY AT  6PM  . senna (SENOKOT) 8.6 MG tablet Take 1 tablet by mouth daily.  . sertraline (ZOLOFT) 50 MG tablet TAKE 1 TABLET  BY MOUTH ONCE DAILY FOR MOOD  . terbinafine (LAMISIL) 250 MG tablet TAKE 1 TABLET BY MOUTH ONCE DAILY FOR  TOENAIL  FUNGUS  . verapamil (CALAN-SR) 240 MG CR tablet Take 1 tablet (240 mg total) by mouth at bedtime.   No current facility-administered medications on file prior to visit.    Allergies  Allergen Reactions  . Lipitor [Atorvastatin]     Elevates LFT's   Past Medical History:  Diagnosis Date  . Anemia   . Anxiety   . Arthritis   . Carpal tunnel syndrome   . Cerebrovascular disease 01/02/2017  . Duodenal diverticulum   . Esophageal motility disorder   . Fatty liver   . GERD (gastroesophageal reflux  disease)   . Hyperlipidemia   . Hypertension   . 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 Sleep apnea   . Tubulovillous adenoma of colon   . Type II or unspecified type diabetes mellitus without mention of complication, not stated as uncontrolled    Health Maintenance  Topic Date Due  . OPHTHALMOLOGY EXAM  07/08/2018  . FOOT EXAM  11/07/2018  . HEMOGLOBIN A1C  11/24/2018  . INFLUENZA VACCINE  01/02/2019  . MAMMOGRAM  06/23/2020  . COLONOSCOPY  02/11/2023  . TETANUS/TDAP  03/26/2023  . DEXA SCAN  Completed  . Hepatitis C Screening  Completed  . PNA vac Low Risk Adult  Completed   Immunization History  Administered Date(s) Administered  . Influenza Split 03/25/2013  . Influenza, High Dose Seasonal PF 03/15/2014, 03/07/2015, 03/27/2016, 04/21/2017, 02/18/2018  . Pneumococcal Conjugate-13 01/26/2014  . Pneumococcal Polysaccharide-23 03/14/2011  . Td 03/25/2013  . Zoster 06/03/2005   Last Colon - 09/22/2012 - Dr Delfin Edis - Dx Lymphocytic colitis                   - 02/10/2018 - Colonoscopy - Dr Henrene Pastor - Recc 5 yr f/u due Sept 2024  Last MGM - 06/23/2008  Past Surgical History:  Procedure Laterality Date  . APPENDECTOMY    . BREAST BIOPSY Right   . CARPAL TUNNEL RELEASE Right 1992  . cataract extration    . COLONOSCOPY    . CYST REMOVAL HAND    . TONSILLECTOMY  1952   Family History  Problem Relation Age of Onset  . Heart disease Mother   . Diabetes Mother   . Colon polyps Mother   . Heart disease Father   . Diabetes Father   . Heart attack Father   . Heart disease Brother   . Heart attack Brother   . Cerebral aneurysm Sister   . Heart disease Brother        Stent  . Colon cancer Neg Hx   . Esophageal cancer Neg Hx   . Stomach cancer Neg Hx   . Rectal cancer Neg Hx   . Breast cancer Neg Hx    Social History   Tobacco Use  . Smoking status: Never Smoker  . Smokeless tobacco: Never Used  Substance Use Topics  . Alcohol use: No  . Drug use: No    ROS  Constitutional: Denies fever, chills, weight loss/gain, headaches, insomnia,  night sweats, and change in appetite. Does c/o fatigue. Eyes: Denies redness, blurred vision, diplopia, discharge, itchy, watery eyes.  ENT: Denies discharge, congestion, post nasal drip, epistaxis, sore throat, earache, hearing loss, dental pain, Tinnitus, Vertigo, Sinus pain, snoring.  Cardio: Denies chest pain, palpitations, irregular heartbeat, syncope, dyspnea, diaphoresis, orthopnea, PND, claudication, edema Respiratory: denies cough, dyspnea, DOE,  pleurisy, hoarseness, laryngitis, wheezing.  Gastrointestinal: Denies dysphagia, heartburn, reflux, water brash, pain, cramps, nausea, vomiting, bloating, diarrhea, constipation, hematemesis, melena, hematochezia, jaundice, hemorrhoids Genitourinary: Denies dysuria, frequency, urgency, nocturia, hesitancy, discharge, hematuria, flank pain Breast: Breast lumps, nipple discharge, bleeding.  Musculoskeletal: Denies arthralgia, myalgia, stiffness, Jt. Swelling, pain, limp, and strain/sprain. Denies falls. Skin: Denies puritis, rash, hives, warts, acne, eczema, changing in skin lesion Neuro: No weakness, tremor, incoordination, spasms, paresthesia, pain Psychiatric: Denies confusion, memory loss, sensory loss. Denies Depression. Endocrine: Denies change in weight, skin, hair change, nocturia, and paresthesia, diabetic polys, visual blurring, hyper / hypo glycemic episodes.  Heme/Lymph: No excessive bleeding, bruising, enlarged lymph nodes.  Physical Exam  BP 130/82   Pulse 80   Temp 97.7 F (36.5 C)   Resp 16   Ht _0  (1.6 m)   Wt 151 lb 3.2 oz (68.6 kg)   BMI 26.78 kg/m   General Appearance: Well nourished, well groomed and in no apparent distress.  Eyes: PERRLA, EOMs, conjunctiva no swelling or erythema, normal fundi and vessels. Sinuses: No frontal/maxillary tenderness ENT/Mouth: EACs patent / TMs  nl. Nares clear without erythema, swelling, mucoid exudates.  Oral hygiene is good. No erythema, swelling, or exudate. Tongue normal, non-obstructing. Tonsils not swollen or erythematous. Hearing normal.  Neck: Supple, thyroid not palpable. No bruits, nodes or JVD. Respiratory: Respiratory effort normal.  BS equal and clear bilateral without rales, rhonci, wheezing or stridor. Cardio: Heart sounds are normal with regular rate and rhythm and no murmurs, rubs or gallops. Peripheral pulses are normal and equal bilaterally without edema. No aortic or femoral bruits. Chest: symmetric with normal excursions and percussion. Breasts: Symmetric, without lumps, nipple discharge, retractions, or fibrocystic changes.  Abdomen: Flat, soft with bowel sounds active. Nontender, no guarding, rebound, hernias, masses, or organomegaly.  Lymphatics: Non tender without lymphadenopathy.  Musculoskeletal: Full ROM all peripheral extremities, joint stability, 5/5 strength, and normal gait. Skin: Warm and dry without rashes, lesions, cyanosis, clubbing or  ecchymosis.  Neuro: Cranial nerves intact, reflexes equal bilaterally. Normal muscle tone, no cerebellar symptoms. Sensation intact.  Pysch: Alert and oriented X 3, normal affect, Insight and Judgment appropriate.   Assessment and Plan  1. Annual Preventative Screening Examination  2. Essential hypertension  - EKG 12-Lead - CBC with Differential/Platelet - COMPLETE METABOLIC PANEL WITH GFR - Magnesium - TSH  3. Hyperlipidemia, mixed  - EKG 12-Lead - Lipid panel - TSH  4. Other specified diabetes mellitus with stage 2 chronic kidney disease, without long-term current use of insulin (HCC)  - EKG 12-Lead - Urinalysis, Routine w reflex microscopic - Microalbumin / creatinine urine ratio - Hemoglobin A1c - Insulin, random  5. Vitamin D deficiency  - VITAMIN D 25 Hydroxyl  6. Gastroesophageal reflux disease  7. Sleep apnea, unspecified type  8. Hyperthyroidism  - TSH  9. Idiopathic gout  - Uric acid   10. Screening for colorectal cancer  - POC Hemoccult Bld/Stl  11. Screening for ischemic heart disease  - EKG 12-Lead  12. FHx: heart disease  13. Medication management  - Urinalysis, Routine w reflex microscopic - Microalbumin / creatinine urine ratio - CBC with Differential/Platelet - COMPLETE METABOLIC PANEL WITH GFR - Magnesium - Lipid panel - TSH - Hemoglobin A1c - Insulin, random - VITAMIN D 25 Hydroxyl - Uric acid     Patient was counseled in prudent diet to achieve/maintain BMI less than 25 for weight control, BP monitoring, regular exercise and medications. Discussed med's effects and SE's. Screening  labs and tests as requested with regular follow-up as recommended. Over 40 minutes of exam, counseling, chart review and high complex critical decision making was performed.   Kirtland Bouchard, MD

## 2019-01-11 NOTE — Patient Instructions (Signed)

## 2019-01-12 ENCOUNTER — Ambulatory Visit (INDEPENDENT_AMBULATORY_CARE_PROVIDER_SITE_OTHER): Payer: PPO | Admitting: Internal Medicine

## 2019-01-12 ENCOUNTER — Other Ambulatory Visit: Payer: Self-pay

## 2019-01-12 VITALS — BP 130/82 | HR 80 | Temp 97.7°F | Resp 16 | Ht 63.0 in | Wt 151.2 lb

## 2019-01-12 DIAGNOSIS — N182 Chronic kidney disease, stage 2 (mild): Secondary | ICD-10-CM

## 2019-01-12 DIAGNOSIS — Z0000 Encounter for general adult medical examination without abnormal findings: Secondary | ICD-10-CM

## 2019-01-12 DIAGNOSIS — Z1212 Encounter for screening for malignant neoplasm of rectum: Secondary | ICD-10-CM

## 2019-01-12 DIAGNOSIS — Z0001 Encounter for general adult medical examination with abnormal findings: Secondary | ICD-10-CM

## 2019-01-12 DIAGNOSIS — I10 Essential (primary) hypertension: Secondary | ICD-10-CM

## 2019-01-12 DIAGNOSIS — Z136 Encounter for screening for cardiovascular disorders: Secondary | ICD-10-CM | POA: Diagnosis not present

## 2019-01-12 DIAGNOSIS — E1322 Other specified diabetes mellitus with diabetic chronic kidney disease: Secondary | ICD-10-CM | POA: Diagnosis not present

## 2019-01-12 DIAGNOSIS — E559 Vitamin D deficiency, unspecified: Secondary | ICD-10-CM

## 2019-01-12 DIAGNOSIS — M1000 Idiopathic gout, unspecified site: Secondary | ICD-10-CM

## 2019-01-12 DIAGNOSIS — Z1211 Encounter for screening for malignant neoplasm of colon: Secondary | ICD-10-CM

## 2019-01-12 DIAGNOSIS — Z8249 Family history of ischemic heart disease and other diseases of the circulatory system: Secondary | ICD-10-CM

## 2019-01-12 DIAGNOSIS — E0590 Thyrotoxicosis, unspecified without thyrotoxic crisis or storm: Secondary | ICD-10-CM

## 2019-01-12 DIAGNOSIS — E782 Mixed hyperlipidemia: Secondary | ICD-10-CM | POA: Diagnosis not present

## 2019-01-12 DIAGNOSIS — Z79899 Other long term (current) drug therapy: Secondary | ICD-10-CM

## 2019-01-12 DIAGNOSIS — G4730 Sleep apnea, unspecified: Secondary | ICD-10-CM

## 2019-01-12 DIAGNOSIS — K219 Gastro-esophageal reflux disease without esophagitis: Secondary | ICD-10-CM

## 2019-01-12 MED ORDER — ACYCLOVIR 800 MG PO TABS: ORAL_TABLET | ORAL | 3 refills | Status: DC

## 2019-01-13 LAB — COMPLETE METABOLIC PANEL WITH GFR
AG Ratio: 2 (calc) (ref 1.0–2.5)
ALT: 32 U/L — ABNORMAL HIGH (ref 6–29)
AST: 34 U/L (ref 10–35)
Albumin: 4.6 g/dL (ref 3.6–5.1)
Alkaline phosphatase (APISO): 75 U/L (ref 37–153)
BUN: 13 mg/dL (ref 7–25)
CO2: 26 mmol/L (ref 20–32)
Calcium: 10.1 mg/dL (ref 8.6–10.4)
Chloride: 104 mmol/L (ref 98–110)
Creat: 0.87 mg/dL (ref 0.60–0.93)
GFR, Est African American: 77 mL/min/{1.73_m2} (ref >=60)
GFR, Est Non African American: 66 mL/min/{1.73_m2} (ref >=60)
Globulin: 2.3 g/dL (calc) (ref 1.9–3.7)
Glucose, Bld: 70 mg/dL (ref 65–99)
Potassium: 4.1 mmol/L (ref 3.5–5.3)
Sodium: 139 mmol/L (ref 135–146)
Total Bilirubin: 0.7 mg/dL (ref 0.2–1.2)
Total Protein: 6.9 g/dL (ref 6.1–8.1)

## 2019-01-13 LAB — URINALYSIS, ROUTINE W REFLEX MICROSCOPIC
Bilirubin Urine: NEGATIVE
Glucose, UA: NEGATIVE
Hgb urine dipstick: NEGATIVE
Ketones, ur: NEGATIVE
Leukocytes,Ua: NEGATIVE
Nitrite: NEGATIVE
Protein, ur: NEGATIVE
Specific Gravity, Urine: 1.01 (ref 1.001–1.03)
pH: 6.5 (ref 5.0–8.0)

## 2019-01-13 LAB — CBC WITH DIFFERENTIAL/PLATELET
Absolute Monocytes: 678 cells/uL (ref 200–950)
Basophils Absolute: 31 cells/uL (ref 0–200)
Basophils Relative: 0.6 %
Eosinophils Absolute: 189 cells/uL (ref 15–500)
Eosinophils Relative: 3.7 %
HCT: 38.4 % (ref 35.0–45.0)
Hemoglobin: 12.7 g/dL (ref 11.7–15.5)
Lymphs Abs: 1663 cells/uL (ref 850–3900)
MCH: 26.3 pg — ABNORMAL LOW (ref 27.0–33.0)
MCHC: 33.1 g/dL (ref 32.0–36.0)
MCV: 79.7 fL — ABNORMAL LOW (ref 80.0–100.0)
MPV: 9 fL (ref 7.5–12.5)
Monocytes Relative: 13.3 %
Neutro Abs: 2540 cells/uL (ref 1500–7800)
Neutrophils Relative %: 49.8 %
Platelets: 225 10*3/uL (ref 140–400)
RBC: 4.82 10*6/uL (ref 3.80–5.10)
RDW: 15.3 % — ABNORMAL HIGH (ref 11.0–15.0)
Total Lymphocyte: 32.6 %
WBC: 5.1 10*3/uL (ref 3.8–10.8)

## 2019-01-13 LAB — LIPID PANEL
Cholesterol: 152 mg/dL (ref <200)
HDL: 50 mg/dL (ref >=50)
LDL Cholesterol (Calc): 78 mg/dL (calc)
Non-HDL Cholesterol (Calc): 102 mg/dL (calc) (ref <130)
Total CHOL/HDL Ratio: 3 (calc) (ref <5.0)
Triglycerides: 144 mg/dL (ref <150)

## 2019-01-13 LAB — MAGNESIUM: Magnesium: 2 mg/dL (ref 1.5–2.5)

## 2019-01-13 LAB — MICROALBUMIN / CREATININE URINE RATIO
Creatinine, Urine: 71 mg/dL (ref 20–275)
Microalb Creat Ratio: 7 mcg/mg creat (ref <30)
Microalb, Ur: 0.5 mg/dL

## 2019-01-13 LAB — URIC ACID: Uric Acid, Serum: 6.3 mg/dL (ref 2.5–7.0)

## 2019-01-13 LAB — VITAMIN D 25 HYDROXY (VIT D DEFICIENCY, FRACTURES): Vit D, 25-Hydroxy: 59 ng/mL (ref 30–100)

## 2019-01-13 LAB — HEMOGLOBIN A1C
Hgb A1c MFr Bld: 6.1 % of total Hgb — ABNORMAL HIGH (ref <5.7)
Mean Plasma Glucose: 128 (calc)
eAG (mmol/L): 7.1 (calc)

## 2019-01-13 LAB — INSULIN, RANDOM: Insulin: 11 u[IU]/mL

## 2019-01-13 LAB — TSH: TSH: 1.19 mIU/L (ref 0.40–4.50)

## 2019-01-16 ENCOUNTER — Encounter: Payer: Self-pay | Admitting: Internal Medicine

## 2019-02-07 DIAGNOSIS — G4733 Obstructive sleep apnea (adult) (pediatric): Secondary | ICD-10-CM | POA: Diagnosis not present

## 2019-02-09 ENCOUNTER — Other Ambulatory Visit: Payer: Self-pay | Admitting: Physician Assistant

## 2019-02-09 ENCOUNTER — Other Ambulatory Visit: Payer: Self-pay | Admitting: Internal Medicine

## 2019-03-02 ENCOUNTER — Other Ambulatory Visit: Payer: Self-pay | Admitting: Internal Medicine

## 2019-03-02 DIAGNOSIS — R0989 Other specified symptoms and signs involving the circulatory and respiratory systems: Secondary | ICD-10-CM

## 2019-03-04 ENCOUNTER — Other Ambulatory Visit: Payer: Self-pay | Admitting: Internal Medicine

## 2019-03-04 DIAGNOSIS — G4733 Obstructive sleep apnea (adult) (pediatric): Secondary | ICD-10-CM | POA: Diagnosis not present

## 2019-03-04 DIAGNOSIS — R0989 Other specified symptoms and signs involving the circulatory and respiratory systems: Secondary | ICD-10-CM

## 2019-03-09 DIAGNOSIS — G4733 Obstructive sleep apnea (adult) (pediatric): Secondary | ICD-10-CM | POA: Diagnosis not present

## 2019-03-10 ENCOUNTER — Other Ambulatory Visit: Payer: Self-pay

## 2019-03-10 DIAGNOSIS — Z1211 Encounter for screening for malignant neoplasm of colon: Secondary | ICD-10-CM

## 2019-03-10 LAB — POC HEMOCCULT BLD/STL (HOME/3-CARD/SCREEN)
Card #2 Fecal Occult Blod, POC: NEGATIVE
Card #3 Fecal Occult Blood, POC: NEGATIVE
Fecal Occult Blood, POC: NEGATIVE

## 2019-03-11 ENCOUNTER — Other Ambulatory Visit: Payer: Self-pay

## 2019-03-11 ENCOUNTER — Ambulatory Visit (INDEPENDENT_AMBULATORY_CARE_PROVIDER_SITE_OTHER): Payer: PPO

## 2019-03-11 VITALS — Temp 97.3°F

## 2019-03-11 DIAGNOSIS — Z1211 Encounter for screening for malignant neoplasm of colon: Secondary | ICD-10-CM | POA: Diagnosis not present

## 2019-03-11 DIAGNOSIS — Z23 Encounter for immunization: Secondary | ICD-10-CM | POA: Diagnosis not present

## 2019-03-11 NOTE — Progress Notes (Signed)
Patient presents to the office forHDFlu Vaccine. Vaccine administered toLEFTDeltoid withoutanycomplication. Temperature taken and recorded 

## 2019-04-09 DIAGNOSIS — G4733 Obstructive sleep apnea (adult) (pediatric): Secondary | ICD-10-CM | POA: Diagnosis not present

## 2019-04-14 NOTE — Progress Notes (Signed)
FOLLOW UP  Assessment:   Essential hypertension Continue medications  Clonopin 0.1 mg PRN systolic 782+ due to labile values Monitor blood pressure at home; call if consistently over 140/90 Continue DASH diet.   Reminder to go to the ER if any CP, SOB, nausea, dizziness, severe HA, changes vision/speech, left arm numbness and tingling and jaw pain.  Hyperthyroidism      Monitor TSH levels - recently within normal range  -     TSH  Type 2 diabetes mellitus with diabetic nephropathy, without long-term current use of insulin (HCC) Discussed general issues about diabetes pathophysiology and management., Educational material distributed., Suggested low cholesterol diet., Encouraged aerobic exercise., Discussed foot care., Reminded to get yearly retinal exam- most recent exam report requested from Dr. Linward Natal office  -     Hemoglobin A1c  CKD stage 2 due to type 2 diabetes mellitus (HCC) Increase fluids, avoid NSAIDS, monitor sugars, will monitor -     CMP WITH GFR  Medication management -     CBC with Differential/Platelet -     CMP/GFR  Mixed hyperlipidemia Continue medications: crestor 10 mg every other day Continue low cholesterol diet and exercise.  -     Lipid panel -     TSH  Vitamin D deficiency Near goal at recent check; continue to recommend supplementation for goal of 60-100 Defer vitamin D level  Onychomycosis  Reviewed appropriate dosing with lamisil; resume 1 month on, 1 month off cycle to complete current course then will reevaluate progress. Proximal nail does appear improved.   Depression/anxiety Encouraged her to take zoloft daily, reduce xanax use, should be using <5 days/week, AS NEEDED ONLY; if needing more follow up sooner for anxiety management and can increase zoloft Reviewed at length today due to poor understanding of her medications Reviewed lifestyle and stress management strategies Continue CPAP  Over 30 minutes of exam, counseling, chart review  and critical decision making was performed Future Appointments  Date Time Provider [place]  07/28/2019 10:30 AM Vicie Mutters, PA-C [facility] None  09/28/2019 10:00 AM Vicie Mutters, PA-C [facility] None  12/13/2019  8:30 AM Ward Givens, NP [facility] None  02/28/2020 10:00 AM [name], MD [facility] None     Subjective:  Michele [name] is a 73 y.o. female who presents for 3 month follow up.   She has OSA on CPAP and follows with neurology, she does report wearing nightly.   Currently completing course of lamisil for R great toe which is improving, but she admits to taking medication inconsistently.   she has a diagnosis of anxiety and is currently prescribed zoloft 50 mg daily, PRN xanax, reports today she has been trying to taper off of both medications, but admits anxiety is poorly controlled. she reports trying to taper off of xanax, currently taking 1/2-1 tab once a day, typically in the evening, on a regular schedule rather than PRN.   BMI is Body mass index is 27.56 kg/m., she has not been working on diet and exercise. Wt Readings from Last 3 Encounters:  04/15/19 155 lb 9.6 oz (70.6 kg)  01/12/19 151 lb 3.2 oz (68.6 kg)  12/10/18 157 lb 3.2 oz (71.3 kg)    Her blood pressure has been controlled at home, long history of labile values, hypertensive emergency several years ago and was in ER, reports recent home average is 130-140/76-80, today their BP is BP: 140/78 She takes clonopin 0.1 mg PRN if systolic is 423.  She does workout (works  on a farm) She denies chest pain, shortness of breath, dizziness.   She is on cholesterol medication (rosuvastatin 10 mg three days a week) and denies myalgias. Her LDL was not at goal of <70. The cholesterol last visit was:   Lab Results  Component Value Date   CHOL 152 01/12/2019   HDL 50 01/12/2019   LDLCALC 78 01/12/2019   TRIG 144 01/12/2019   CHOLHDL 3.0 01/12/2019    She has not been working on diet and  exercise for T2 diabetes, well controlled with metformin 4 tabs daily with recent A1C in prediabetic range, and denies increased appetite, nausea, paresthesia of the feet, polydipsia, polyuria, visual disturbances and vomiting. She does check fasting glucose 110-130. Last A1C in the office was:  Lab Results  Component Value Date   HGBA1C 6.1 (H) 01/12/2019   She has stable CKD II monitored via this office:  Lab Results  Component Value Date   GFRNONAA 66 01/12/2019   Patient is on Vitamin D supplement and was essentially at goal of 60 at last check:    Lab Results  Component Value Date   VD25OH 42 01/12/2019     She has a documented history of hyperthyroidism, suspected thyroiditis in 2017 which resolved spontaneously. Recent TSHs have been within normal range:    Lab Results  Component Value Date   TSH 1.19 01/12/2019   Patient is not on allopurinol for gout and does not report a recent flare.  Lab Results  Component Value Date   LABURIC 6.3 01/12/2019     Medication Review: Current Outpatient Medications on File Prior to Visit  Medication Sig Dispense Refill  . ALPRAZolam (XANAX) 0.5 MG tablet Take 1/2 to 1 tablet 1 or 2 x /day ONLY if Needed for Anxiety Attack or Insomnia  &  limit to 5 days /week to avoid addiction 60 tablet 0  . APPLE CIDER VINEGAR PO Take by mouth. Takes daily.    Marland Kitchen aspirin 325 MG EC tablet Take 325 mg by mouth daily.    Marland Kitchen BIOTIN PO Take 1 tablet by mouth daily.    . blood glucose meter kit and supplies Test sugar once a day. 1 each 0  . Blood Glucose Monitoring Suppl DEVI Test sugars once daily.DX diabetes 1 each 0  . Cholecalciferol (VITAMIN D PO) Take 10,000 Units by mouth daily.     . Cinnamon 500 MG capsule Take 500 mg by mouth daily.    . cloNIDine (CATAPRES) 0.2 MG tablet Take 0.2 mg by mouth as needed. Takes if systolic BP is over 528.    Marland Kitchen Cyanocobalamin (VITAMIN B-12 PO) Take 1 tablet by mouth daily.    Marland Kitchen glucose blood test strip Test sugar  once a day DX diabetes 100 each 12  . Lancets MISC Test blood sugar once daily 100 each 11  . Magnesium 500 MG TABS Take 1 tablet by mouth 3 (three) times daily.    . magnesium hydroxide (MILK OF MAGNESIA) 400 MG/5ML suspension Take by mouth daily as needed for mild constipation.    . metFORMIN (GLUCOPHAGE) 500 MG tablet Take 1 tablet with Breakfast & Lunch and 2 tablets with supper for Diabetes (Patient taking differently: Take 1 tablet with Breakfast & Lunch and 2 tablets with supper for Diabetes. Quick release) 360 tablet 3  . minoxidil (LONITEN) 10 MG tablet Take 1/2 to 1 tablet Daily for BP 90 tablet 3  . olmesartan (BENICAR) 40 MG tablet Take 1 tablet Daily in  the evening for BP 90 tablet 3  . Omega-3 Fatty Acids (FISH OIL PO) Take 1 capsule by mouth daily.    . polyethylene glycol (MIRALAX / GLYCOLAX) packet Take 17 g by mouth as needed.     . rosuvastatin (CRESTOR) 20 MG tablet TAKE 1 TABLET BY MOUTH ONCE DAILY AT  6PM 90 tablet 0  . sertraline (ZOLOFT) 50 MG tablet Take 1 tablet Daily for Mood 90 tablet 3  . terbinafine (LAMISIL) 250 MG tablet TAKE 1 TABLET BY MOUTH ONCE DAILY FOR  TOENAIL  FUNGUS 90 tablet 0  . verapamil (CALAN-SR) 240 MG CR tablet Take 1 tablet Daily with food for BP 90 tablet 3  . acyclovir (ZOVIRAX) 800 MG tablet Take 1 tablet 3 x /day for Cold Sores / Fever Blisters (Patient not taking: Reported on 04/15/2019) 90 tablet 3  . senna (SENOKOT) 8.6 MG tablet Take 1 tablet by mouth daily.     No current facility-administered medications on file prior to visit.     Allergies  Allergen Reactions  . Lipitor [Atorvastatin]     Elevates LFT's    Current Problems (verified) Patient Active Problem List   Diagnosis Date Noted  . Anxiety 12/10/2018  . Sleep apnea on CPAP 09/18/2018  . Enrolled in chronic care management 09/18/2018  . Vitiligo 11/09/2017  . Encounter for Medicare annual wellness exam 07/24/2017  . Type 2 diabetes mellitus ([place]) 07/24/2017  .  Overweight (BMI 25.0-29.9) 07/24/2017  . Cerebrovascular disease 01/02/2017  . Hyperthyroidism 09/11/2015  . Gout 09/22/2014  . Vitamin D deficiency 06/28/2013  . Medication management 06/28/2013  . Anemia 06/28/2013  . CKD stage 2 due to type 2 diabetes mellitus ([place])   . Hyperlipidemia associated with type 2 diabetes mellitus ([place]) 03/21/2008  . Essential hypertension 03/21/2008  . GERD 03/21/2008  . Fatty liver 03/21/2008  . COLONIC POLYPS, ADENOMATOUS, HX OF 03/21/2008    Review of Systems  Constitutional: Negative for malaise/fatigue and weight loss.  HENT: Negative for hearing loss and tinnitus.   Eyes: Negative for blurred vision and double vision.  Respiratory: Negative for cough, shortness of breath and wheezing.   Cardiovascular: Negative for chest pain, palpitations, orthopnea, claudication and leg swelling.  Gastrointestinal: Negative for abdominal pain, blood in stool, constipation, diarrhea, heartburn, melena, nausea and vomiting.  Genitourinary: Negative.   Musculoskeletal: Negative for joint pain and myalgias.  Skin: Negative for rash.  Neurological: Negative for dizziness, tingling, sensory change, weakness and headaches.  Endo/Heme/Allergies: Negative for polydipsia.  Psychiatric/Behavioral: Negative for depression and substance abuse. The patient is nervous/anxious. The patient does not have insomnia.   All other systems reviewed and are negative.   Objective:     Today's Vitals   04/15/19 1036 04/15/19 1130  BP: (!) 164/82 140/78  Pulse: 77   Temp: (!) 97 F (36.1 C)   SpO2: 96%   Weight: 155 lb 9.6 oz (70.6 kg)   Height: '5\' 3"'  (1.6 m)    Body mass index is 27.56 kg/m.  General appearance: alert, no distress, WD/WN, female HEENT: normocephalic, sclerae anicteric, TMs pearly, nares patent, no discharge or erythema, pharynx normal Oral cavity: MMM, no lesions Neck: supple, no lymphadenopathy, no thyromegaly, no masses Heart: RRR, normal S1, S2, no  murmurs Lungs: CTA bilaterally, no wheezes, rhonchi, or rales Abdomen: +bs, soft, non tender, non distended, no masses, no hepatomegaly, no splenomegaly Musculoskeletal: nontender, no swelling, no obvious deformity Extremities: no edema, no cyanosis, no clubbing Pulses: 2+ symmetric, upper and  lower extremities, normal cap refill Skin: warm/dry without concerning lesions, rash, ecchymosis; R great toe nail medial/distally thickened/brittle; nail bed intact  Neurological: alert, oriented x 3, CN2-12 intact, strength normal upper extremities and lower extremities, sensation normal throughout, DTRs 2+ throughout, no cerebellar signs, gait normal Psychiatric: depressed affect, behavior normal, pleasant    Izora Ribas, NP   04/15/2019

## 2019-04-15 ENCOUNTER — Ambulatory Visit (INDEPENDENT_AMBULATORY_CARE_PROVIDER_SITE_OTHER): Payer: PPO | Admitting: Adult Health

## 2019-04-15 ENCOUNTER — Encounter: Payer: Self-pay | Admitting: Adult Health

## 2019-04-15 ENCOUNTER — Other Ambulatory Visit: Payer: Self-pay

## 2019-04-15 VITALS — BP 140/78 | HR 77 | Temp 97.0°F | Ht 63.0 in | Wt 155.6 lb

## 2019-04-15 DIAGNOSIS — E663 Overweight: Secondary | ICD-10-CM | POA: Diagnosis not present

## 2019-04-15 DIAGNOSIS — E559 Vitamin D deficiency, unspecified: Secondary | ICD-10-CM

## 2019-04-15 DIAGNOSIS — Z79899 Other long term (current) drug therapy: Secondary | ICD-10-CM | POA: Diagnosis not present

## 2019-04-15 DIAGNOSIS — N182 Chronic kidney disease, stage 2 (mild): Secondary | ICD-10-CM

## 2019-04-15 DIAGNOSIS — E1122 Type 2 diabetes mellitus with diabetic chronic kidney disease: Secondary | ICD-10-CM | POA: Diagnosis not present

## 2019-04-15 DIAGNOSIS — E0590 Thyrotoxicosis, unspecified without thyrotoxic crisis or storm: Secondary | ICD-10-CM

## 2019-04-15 DIAGNOSIS — E1169 Type 2 diabetes mellitus with other specified complication: Secondary | ICD-10-CM | POA: Diagnosis not present

## 2019-04-15 DIAGNOSIS — F419 Anxiety disorder, unspecified: Secondary | ICD-10-CM | POA: Diagnosis not present

## 2019-04-15 DIAGNOSIS — I10 Essential (primary) hypertension: Secondary | ICD-10-CM | POA: Diagnosis not present

## 2019-04-15 DIAGNOSIS — E785 Hyperlipidemia, unspecified: Secondary | ICD-10-CM | POA: Diagnosis not present

## 2019-04-15 NOTE — Patient Instructions (Addendum)
Goals    . Blood Pressure < 130/80     Try to get BP more consistent Monitor daily    . Exercise 150 min/wk Moderate Activity    . HEMOGLOBIN A1C < 7.0        Take sertraline DAILY - this is non-addictive and will help with day to day anxiety and mood  Xanax is AS NEEDED ONLY - potential for addiction and should only need occasionally. If needing frequently, means we need to change your daily anxiety medicine or work on lifestyle to help manage stress  Try to limit xanax on only occasional use, only if VERY anxious  Gradually cut back on xanax - avoid taking daily at the same time, cut down to taking 1/2 tab only, then gradually start skipping some nights       Stress Stress is a normal reaction to life events. Stress is what you feel when life demands more than you are used to, or more than you think you can handle. Some stress can be useful, such as studying for a test or meeting a deadline at work. Stress that occurs too often or for too long can cause problems. It can affect your emotional health and interfere with relationships and normal daily activities. Too much stress can weaken your body's defense system (immune system) and increase your risk for physical illness. If you already have a medical problem, stress can make it worse. What are the causes? All sorts of life events can cause stress. An event that causes stress for one person may not be stressful for another person. Major life events, whether positive or negative, commonly cause stress. Examples include:  Losing a job or starting a new job.  Losing a loved one.  Moving to a new town or home.  Getting married or divorced.  Having a baby.  Injury or illness. Less obvious life events can also cause stress, especially if they occur day after day or in combination with each other. Examples include:  Working long hours.  Driving in traffic.  Caring for children.  Being in debt.  Being in a difficult  relationship. What are the signs or symptoms? Stress can cause emotional symptoms, including:  Anxiety. This is feeling worried, afraid, on edge, overwhelmed, or out of control.  Anger, including irritation or impatience.  Depression. This is feeling sad, down, helpless, or guilty.  Trouble focusing, remembering, or making decisions. Stress can cause physical symptoms, including:  Aches and pains. These may affect your head, neck, back, stomach, or other areas of your body.  Tight muscles or a clenched jaw.  Low energy.  Trouble sleeping. Stress can cause unhealthy behaviors, including:  Eating to feel better (overeating) or skipping meals.  Working too much or putting off tasks.  Smoking, drinking alcohol, or using drugs to feel better. How is this diagnosed? Stress is diagnosed through an assessment by your health care provider. He or she may diagnose this condition based on:  Your symptoms and any stressful life events.  Your medical history.  Tests to rule out other causes of your symptoms. Depending on your condition, your health care provider may refer you to a specialist for further evaluation. How is this treated?  Stress management techniques are the recommended treatment for stress. Medicine is not typically recommended for the treatment of stress. Techniques to reduce your reaction to stressful life events include:  Stress identification. Monitor yourself for symptoms of stress and identify what causes stress for you. These  skills may help you to avoid or prepare for stressful events.  Time management. Set your priorities, keep a calendar of events, and learn to say "no." Taking these actions can help you avoid making too many commitments. Techniques for coping with stress include:  Rethinking the problem. Try to think realistically about stressful events rather than ignoring them or overreacting. Try to find the positives in a stressful situation rather than  focusing on the negatives.  Exercise. Physical exercise can release both physical and emotional tension. The key is to find a form of exercise that you enjoy and do it regularly.  Relaxation techniques. These relax the body and mind. The key is to find one or more that you enjoy and use the technique(s) regularly. Examples include: ? Meditation, deep breathing, or progressive relaxation techniques. ? Yoga or tai chi. ? Biofeedback, mindfulness techniques, or journaling. ? Listening to music, being out in nature, or participating in other hobbies.  Practicing a healthy lifestyle. Eat a balanced diet, drink plenty of water, limit or avoid caffeine, and get plenty of sleep.  Having a strong support network. Spend time with family, friends, or other people you enjoy being around. Express your feelings and talk things over with someone you trust. Counseling or talk therapy with a mental health professional may be helpful if you are having trouble managing stress on your own. Follow these instructions at home: Lifestyle   Avoid drugs.  Do not use any products that contain nicotine or tobacco, such as cigarettes and e-cigarettes. If you need help quitting, ask your health care provider.  Limit alcohol intake to no more than 1 drink a day for nonpregnant women and 2 drinks a day for men. One drink equals 12 oz of beer, 5 oz of wine, or 1 oz of hard liquor.  Do not use alcohol or drugs to relax.  Eat a balanced diet that includes fresh fruits and vegetables, whole grains, lean meats, fish, eggs, and beans, and low-fat dairy. Avoid processed foods and foods high in added fat, sugar, and salt.  Exercise at least 30 minutes on 5 or more days each week.  Get 7-8 hours of sleep each night. General instructions   Practice stress management techniques as discussed with your health care provider.  Drink enough fluid to keep your urine clear or pale yellow.  Take over-the-counter and  prescription medicines only as told by your health care provider.  Keep all follow-up visits as told by your health care provider. This is important. Contact a health care provider if:  Your symptoms get worse.  You have new symptoms.  You feel overwhelmed by your problems and can no longer manage them on your own. Get help right away if:  You have thoughts of hurting yourself or others. If you ever feel like you may hurt yourself or others, or have thoughts about taking your own life, get help right away. You can go to your nearest emergency department or call:  Your local emergency services (911 in the [place]  A suicide crisis helpline, such as the [facility] at 680-598-7886. This is open 24 hours a day. Summary  Stress is a normal reaction to life events. It can cause problems if it happens too often or for too long.  Practicing stress management techniques is the best way to treat stress.  Counseling or talk therapy with a mental health professional may be helpful if you are having trouble managing stress on your own. This  information is not intended to replace advice given to you by your health care provider. Make sure you discuss any questions you have with your health care provider. Document Released: 11/13/2000 Document Revised: 05/02/2017 Document Reviewed: 07/10/2016 Elsevier Patient Education  2020 [employer].

## 2019-04-16 LAB — COMPLETE METABOLIC PANEL WITH GFR
AG Ratio: 1.9 (calc) (ref 1.0–2.5)
ALT: 30 U/L — ABNORMAL HIGH (ref 6–29)
AST: 32 U/L (ref 10–35)
Albumin: 4.5 g/dL (ref 3.6–5.1)
Alkaline phosphatase (APISO): 93 U/L (ref 37–153)
BUN: 12 mg/dL (ref 7–25)
CO2: 26 mmol/L (ref 20–32)
Calcium: 9.4 mg/dL (ref 8.6–10.4)
Chloride: 106 mmol/L (ref 98–110)
Creat: 0.8 mg/dL (ref 0.60–0.93)
GFR, Est African American: 85 mL/min/{1.73_m2} (ref >=60)
GFR, Est Non African American: 73 mL/min/{1.73_m2} (ref >=60)
Globulin: 2.4 g/dL (calc) (ref 1.9–3.7)
Glucose, Bld: 103 mg/dL — ABNORMAL HIGH (ref 65–99)
Potassium: 4.1 mmol/L (ref 3.5–5.3)
Sodium: 142 mmol/L (ref 135–146)
Total Bilirubin: 0.6 mg/dL (ref 0.2–1.2)
Total Protein: 6.9 g/dL (ref 6.1–8.1)

## 2019-04-16 LAB — HEMOGLOBIN A1C
Hgb A1c MFr Bld: 5.7 % of total Hgb — ABNORMAL HIGH (ref <5.7)
Mean Plasma Glucose: 117 (calc)
eAG (mmol/L): 6.5 (calc)

## 2019-04-16 LAB — CBC WITH DIFFERENTIAL/PLATELET
Absolute Monocytes: 490 cells/uL (ref 200–950)
Basophils Absolute: 20 cells/uL (ref 0–200)
Basophils Relative: 0.4 %
Eosinophils Absolute: 168 cells/uL (ref 15–500)
Eosinophils Relative: 3.3 %
HCT: 38.5 % (ref 35.0–45.0)
Hemoglobin: 12.2 g/dL (ref 11.7–15.5)
Lymphs Abs: 1433 cells/uL (ref 850–3900)
MCH: 25.5 pg — ABNORMAL LOW (ref 27.0–33.0)
MCHC: 31.7 g/dL — ABNORMAL LOW (ref 32.0–36.0)
MCV: 80.4 fL (ref 80.0–100.0)
MPV: 8.9 fL (ref 7.5–12.5)
Monocytes Relative: 9.6 %
Neutro Abs: 2989 cells/uL (ref 1500–7800)
Neutrophils Relative %: 58.6 %
Platelets: 212 10*3/uL (ref 140–400)
RBC: 4.79 10*6/uL (ref 3.80–5.10)
RDW: 14.3 % (ref 11.0–15.0)
Total Lymphocyte: 28.1 %
WBC: 5.1 10*3/uL (ref 3.8–10.8)

## 2019-04-16 LAB — LIPID PANEL
Cholesterol: 145 mg/dL (ref <200)
HDL: 45 mg/dL — ABNORMAL LOW (ref >=50)
LDL Cholesterol (Calc): 69 mg/dL (calc)
Non-HDL Cholesterol (Calc): 100 mg/dL (calc) (ref <130)
Total CHOL/HDL Ratio: 3.2 (calc) (ref <5.0)
Triglycerides: 222 mg/dL — ABNORMAL HIGH (ref <150)

## 2019-04-16 LAB — MAGNESIUM: Magnesium: 2 mg/dL (ref 1.5–2.5)

## 2019-04-16 LAB — TSH: TSH: 0.76 mIU/L (ref 0.40–4.50)

## 2019-06-09 DIAGNOSIS — G4733 Obstructive sleep apnea (adult) (pediatric): Secondary | ICD-10-CM | POA: Diagnosis not present

## 2019-06-24 ENCOUNTER — Ambulatory Visit: Payer: PPO | Attending: Internal Medicine

## 2019-06-24 DIAGNOSIS — Z23 Encounter for immunization: Secondary | ICD-10-CM

## 2019-06-24 NOTE — Progress Notes (Signed)
   Covid-19 Vaccination Clinic  Name:  Michele [name]    MRN: [id number] DOB: January 27, 1946  06/24/2019  [name] was observed post Covid-19 immunization for 15 minutes without incidence. She was provided with Vaccine Information Sheet and instruction to access the V-Safe system.   [name] was instructed to call 911 with any severe reactions post vaccine: Marland Kitchen Difficulty breathing  . Swelling of your face and throat  . A fast heartbeat  . A bad rash all over your body  . Dizziness and weakness    Immunizations Administered    Name Date Dose VIS Date Route   Pfizer COVID-19 Vaccine 06/24/2019  4:43 PM 0.3 mL 05/14/2019 Intramuscular   Manufacturer: [place]   Lot: [id number]   [place]: [id number]

## 2019-07-15 ENCOUNTER — Ambulatory Visit: Payer: PPO | Attending: Internal Medicine

## 2019-07-15 DIAGNOSIS — Z23 Encounter for immunization: Secondary | ICD-10-CM | POA: Insufficient documentation

## 2019-07-15 NOTE — Progress Notes (Signed)
   Covid-19 Vaccination Clinic  Name:  SEHAJ HEART    MRN: [id number] DOB: Oct 23, 1945  07/15/2019  Ms. Petrasek was observed post Covid-19 immunization for 15 minutes without incidence. She was provided with Vaccine Information Sheet and instruction to access the V-Safe system.   Ms. Pagani was instructed to call 911 with any severe reactions post vaccine: Marland Kitchen Difficulty breathing  . Swelling of your face and throat  . A fast heartbeat  . A bad rash all over your body  . Dizziness and weakness    Immunizations Administered    Name Date Dose VIS Date Route   Pfizer COVID-19 Vaccine 07/15/2019  5:33 PM 0.3 mL 05/14/2019 Intramuscular   Manufacturer: [place]   Lot: [id number]   [place]: [id number]

## 2019-07-20 ENCOUNTER — Ambulatory Visit: Payer: PPO | Admitting: Internal Medicine

## 2019-07-26 ENCOUNTER — Ambulatory Visit: Payer: PPO

## 2019-07-26 NOTE — Progress Notes (Signed)
FOLLOW UP  Assessment:   OSA Try different mask, try to wear longer than 4 hours.   Cerebrovascular disease Control blood pressure, cholesterol, glucose, increase exercise.   Essential hypertension Monitor blood pressure at home; call if consistently over 130/80, will follow up closely next week Continue DASH diet.   Reminder to go to the ER if any CP, SOB, nausea, dizziness, severe HA, changes vision/speech, left arm numbness and tingling and jaw pain.  Hyperthyroidism      Monitor TSH levels - recently within normal range   Type 2 diabetes mellitus with diabetic nephropathy, without long-term current use of insulin ([place]) Discussed general issues about diabetes pathophysiology and management., Educational material distributed., Suggested low cholesterol diet., Encouraged aerobic exercise., Discussed foot care., Reminded to get yearly retinal exam- most recent exam report available and extracted.  CKD stage 3 due to type 2 diabetes mellitus (HCC) Increase fluids, avoid NSAIDS, monitor sugars, will monitor  Mixed hyperlipidemia Continue medications: crestor 20 mg daily Continue low cholesterol diet and exercise.   Vitamin D deficiency Near goal at recent check; continue to recommend supplementation for goal of 70-100 Defer vitamin D level  Overweight (BMI 25.0-29.9) Long discussion about weight loss, diet, and exercise Recommended diet heavy in fruits and veggies and low in animal meats, cheeses, and dairy products, appropriate calorie intake Discussed appropriate weight for height Follow up at next visit  Over 30 minutes of exam, counseling, chart review and critical decision making was performed Future Appointments  Date Time Provider [place]  07/28/2019 10:30 AM Vicie Mutters, PA-C [facility] None  09/01/2019 10:30 AM [facility] MM 2 [facility]  11/02/2019  2:00 PM Vicie Mutters, PA-C [facility] None  12/13/2019  8:30 AM Ward Givens, NP [facility] None   02/28/2020 10:00 AM [name], MD [facility] None     Subjective:  Michele [name] is a 74 y.o. female who presents for 3 month follow up.   BMI is Body mass index is 27.99 kg/m., she has not been working on diet and exercise. Wt Readings from Last 3 Encounters:  07/28/19 158 lb (71.7 kg)  04/15/19 155 lb 9.6 oz (70.6 kg)  01/12/19 151 lb 3.2 oz (68.6 kg)    Her blood pressure has been controlled at home, renal US 2014, running as high as 160 but can be as low as 100/60, she is on benicar 63m a day just started, verapamil 240, minoxidil 10 mg at supper, she will take 1/2 of 0.2 clonidine if her systolic is above 1462 will do this once a week or less often, today their BP is BP: (!) 144/76  She does have OSA, is on CPAP at least 4 hours a night but it will give her a headache. Last sleep study 12/2017. [facility].   She does workout (works on a farm) She denies chest pain, shortness of breath. She will occ take a clonodine if her BP is over 160, very rare, in the late evening if it happens.   She has not been working on diet and exercise for T2 diabetes With CKD  With hyperlipidemia crestor 20 1/2 (133m 3 x a week and denies myalgias, at goal less than 70  well controlled with metformin with recent A1C in prediabetic range sugar was 106 this AM highest is 120 int he morning  denies increased appetite, nausea, paresthesia of the feet, polydipsia, polyuria, visual disturbances and vomiting.  Last A1C in the office was:  Lab Results  Component Value Date  HGBA1C 5.7 (H) 04/15/2019   Lab Results  Component Value Date   CHOL 145 04/15/2019   HDL 45 (L) 04/15/2019   LDLCALC 69 04/15/2019   TRIG 222 (H) 04/15/2019   CHOLHDL 3.2 04/15/2019   Last GFR: Lab Results  Component Value Date   GFRNONAA 73 04/15/2019   Patient is on Vitamin D supplement and was near goal of 70 at last check:    Lab Results  Component Value Date   VD25OH 59 01/12/2019     She has a documented  history of hyperthyroidism. She is not currently on medications for thyroid, last TSH was within normal range:    Lab Results  Component Value Date   TSH 0.76 04/15/2019   Patient is not on allopurinol for gout and does not report a recent flare.  Lab Results  Component Value Date   LABURIC 6.3 01/12/2019     Medication Review:  Current Outpatient Medications (Endocrine & Metabolic):  .  metFORMIN (GLUCOPHAGE) 500 MG tablet, Take 1 tablet with Breakfast & Lunch and 2 tablets with supper for Diabetes (Patient taking differently: Take 1 tablet with Breakfast & Lunch and 2 tablets with supper for Diabetes. Quick release)  Current Outpatient Medications (Cardiovascular):  .  cloNIDine (CATAPRES) 0.2 MG tablet, Take 0.2 mg by mouth as needed. Takes if systolic BP is over 381. .  minoxidil (LONITEN) 10 MG tablet, Take 1/2 to 1 tablet Daily for BP .  olmesartan (BENICAR) 40 MG tablet, Take 1 tablet Daily in the evening for BP .  rosuvastatin (CRESTOR) 20 MG tablet, TAKE 1 TABLET BY MOUTH ONCE DAILY AT  6PM .  verapamil (CALAN-SR) 240 MG CR tablet, Take 1 tablet Daily with food for BP   Current Outpatient Medications (Analgesics):  .  aspirin 325 MG EC tablet, Take 325 mg by mouth daily.  Current Outpatient Medications (Hematological):  Marland Kitchen  Cyanocobalamin (VITAMIN B-12 PO), Take 1 tablet by mouth daily.  Current Outpatient Medications (Other):  Marland Kitchen  APPLE CIDER VINEGAR PO, Take by mouth. Takes daily. Marland Kitchen  BIOTIN PO, Take 1 tablet by mouth daily. .  blood glucose meter kit and supplies, Test sugar once a day. .  Blood Glucose Monitoring Suppl DEVI, Test sugars once daily.DX diabetes .  Cholecalciferol (VITAMIN D PO), Take 10,000 Units by mouth daily.  .  Cinnamon 500 MG capsule, Take 500 mg by mouth daily. Marland Kitchen  glucose blood test strip, Test sugar once a day DX diabetes .  Lancets MISC, Test blood sugar once daily .  Magnesium 500 MG TABS, Take 1 tablet by mouth 3 (three) times daily. .   magnesium hydroxide (MILK OF MAGNESIA) 400 MG/5ML suspension, Take by mouth daily as needed for mild constipation. .  Omega-3 Fatty Acids (FISH OIL PO), Take 1 capsule by mouth daily. .  polyethylene glycol (MIRALAX / GLYCOLAX) packet, Take 17 g by mouth as needed.  .  senna (SENOKOT) 8.6 MG tablet, Take 1 tablet by mouth daily. .  sertraline (ZOLOFT) 50 MG tablet, Take 1 tablet Daily for Mood .  terbinafine (LAMISIL) 250 MG tablet, TAKE 1 TABLET BY MOUTH ONCE DAILY FOR  TOENAIL  FUNGUS   Allergies  Allergen Reactions  . Lipitor [Atorvastatin]     Elevates LFT's    Current Problems (verified) Patient Active Problem List   Diagnosis Date Noted  . Anxiety 12/10/2018  . Sleep apnea on CPAP 09/18/2018  . Enrolled in chronic care management 09/18/2018  . Vitiligo 11/09/2017  .  Encounter for Medicare annual wellness exam 07/24/2017  . Type 2 diabetes mellitus ([place]) 07/24/2017  . Overweight (BMI 25.0-29.9) 07/24/2017  . Cerebrovascular disease 01/02/2017  . History of hyperthyroidism 09/11/2015  . Gout 09/22/2014  . Vitamin D deficiency 06/28/2013  . Medication management 06/28/2013  . Anemia 06/28/2013  . CKD stage 2 due to type 2 diabetes mellitus ([place])   . Hyperlipidemia associated with type 2 diabetes mellitus ([place]) 03/21/2008  . Essential hypertension 03/21/2008  . GERD 03/21/2008  . Fatty liver 03/21/2008  . COLONIC POLYPS, ADENOMATOUS, HX OF 03/21/2008   Surgical History: reviewed and unchanged Family History: reviewed and unchanged Social History: reviewed and unchanged    Review of Systems  Constitutional: Positive for malaise/fatigue. Negative for weight loss.  HENT: Negative for ear pain (Intermittent, R ear, improved on allergy medication), hearing loss and tinnitus.   Eyes: Negative for blurred vision and double vision.  Respiratory: Negative for cough, shortness of breath and wheezing.   Cardiovascular: Negative for chest pain, palpitations, orthopnea,  claudication and leg swelling.  Gastrointestinal: Negative for abdominal pain, blood in stool, constipation, diarrhea, heartburn, melena, nausea and vomiting.  Genitourinary: Negative.   Musculoskeletal: Negative for falls, joint pain and myalgias.  Skin: Negative for rash.  Neurological: Negative for dizziness, tingling, sensory change, speech change, focal weakness, loss of consciousness, weakness and headaches.  Endo/Heme/Allergies: Negative for polydipsia.  Psychiatric/Behavioral: Negative for depression, memory loss and substance abuse. The patient is not nervous/anxious and does not have insomnia.   All other systems reviewed and are negative.   Objective:     Today's Vitals   07/28/19 0947  BP: (!) 144/76  Pulse: 78  Temp: 97.7 F (36.5 C)  SpO2: 98%  Weight: 158 lb (71.7 kg)   Body mass index is 27.99 kg/m. General Appearance: Well nourished, in no apparent distress. Eyes: conjunctiva no swelling or erythema ENT/Mouth: No erythema, swelling, or exudate on post pharynx.  Tonsils not swollen or erythematous. Hearing normal.  Neck: Supple Respiratory: Respiratory effort normal, BS equal bilaterally without rales, rhonchi, wheezing or stridor.  Cardio: RRR with no MRGs. Brisk peripheral pulses without edema.  Abdomen: Soft, + BS.  Non tender, no guarding, rebound, hernias, masses. Lymphatics: Non tender without lymphadenopathy.  Musculoskeletal: Symmetrical strength, normal gait.  Skin: Warm, dry without rashes, lesions, ecchymosis.  Neuro: Cranial nerves intact. Normal muscle tone, no cerebellar symptoms. Sensation intact.  Psych: Awake and oriented X 3, normal affect, Insight and Judgment appropriate.    Vicie Mutters, PA-C   07/28/2019

## 2019-07-27 ENCOUNTER — Other Ambulatory Visit: Payer: Self-pay | Admitting: Internal Medicine

## 2019-07-27 DIAGNOSIS — Z1231 Encounter for screening mammogram for malignant neoplasm of breast: Secondary | ICD-10-CM

## 2019-07-28 ENCOUNTER — Other Ambulatory Visit: Payer: Self-pay

## 2019-07-28 ENCOUNTER — Ambulatory Visit (INDEPENDENT_AMBULATORY_CARE_PROVIDER_SITE_OTHER): Payer: PPO | Admitting: Physician Assistant

## 2019-07-28 ENCOUNTER — Encounter: Payer: Self-pay | Admitting: Physician Assistant

## 2019-07-28 VITALS — BP 144/76 | HR 78 | Temp 97.7°F | Wt 158.0 lb

## 2019-07-28 DIAGNOSIS — G4730 Sleep apnea, unspecified: Secondary | ICD-10-CM

## 2019-07-28 DIAGNOSIS — E785 Hyperlipidemia, unspecified: Secondary | ICD-10-CM

## 2019-07-28 DIAGNOSIS — N182 Chronic kidney disease, stage 2 (mild): Secondary | ICD-10-CM

## 2019-07-28 DIAGNOSIS — E1122 Type 2 diabetes mellitus with diabetic chronic kidney disease: Secondary | ICD-10-CM

## 2019-07-28 DIAGNOSIS — I10 Essential (primary) hypertension: Secondary | ICD-10-CM

## 2019-07-28 DIAGNOSIS — E1169 Type 2 diabetes mellitus with other specified complication: Secondary | ICD-10-CM | POA: Diagnosis not present

## 2019-07-28 DIAGNOSIS — Z79899 Other long term (current) drug therapy: Secondary | ICD-10-CM | POA: Diagnosis not present

## 2019-07-28 DIAGNOSIS — I679 Cerebrovascular disease, unspecified: Secondary | ICD-10-CM

## 2019-07-28 DIAGNOSIS — E559 Vitamin D deficiency, unspecified: Secondary | ICD-10-CM

## 2019-07-28 MED ORDER — CLONIDINE HCL 0.2 MG PO TABS: 0.2000 mg | ORAL_TABLET | ORAL | 2 refills | Status: DC | PRN

## 2019-07-28 NOTE — Patient Instructions (Addendum)
HYPERTENSION INFORMATION  Can try 1/2 of olmestartan in the morning and half at night or all of it in the morning, take the other BP meds at night.   I would suggest trying a different head piece for your CPAP Walk with your DME company, [employer].    Monitor your blood pressure at home, please keep a record and bring that in with you to your next office visit.   Go to the ER if any CP, SOB, nausea, dizziness, severe HA, changes vision/speech  Testing/Procedures: HOW TO TAKE YOUR BLOOD PRESSURE:  Rest 5 minutes before taking your blood pressure.  Don't smoke or drink caffeinated beverages for at least 30 minutes before.  Take your blood pressure before (not after) you eat.  Sit comfortably with your back supported and both feet on the floor (don't cross your legs).  Elevate your arm to heart level on a table or a desk.  Use the proper sized cuff. It should fit smoothly and snugly around your bare upper arm. There should be enough room to slip a fingertip under the cuff. The bottom edge of the cuff should be 1 inch above the crease of the elbow.  Due to a recent study, SPRINT, we have changed our goal for the systolic or top blood pressure number. Ideally we want your top number at 120.  In the [facility] Trial, 5000 people were randomized to a goal BP of 120 and 5000 people were randomized to a goal BP of less than 140. The patients with the goal BP at 120 had LESS DEMENTIA, LESS HEART ATTACKS, AND LESS STROKES, AS WELL AS OVERALL DECREASED MORTALITY OR DEATH RATE.   There was another study that showed taking your blood pressure medications at night decrease cardiovascular events.  However if you are on a fluid pill, please take this in the morning.   If you are willing, our goal BP is the top number of 120.  Your most recent BP: BP: (!) 144/76   Take your medications faithfully as instructed. Maintain a healthy weight. Get at least 150 minutes of aerobic exercise per week. Minimize  salt intake. Minimize alcohol intake  DASH Eating Plan DASH stands for "Dietary Approaches to Stop Hypertension." The DASH eating plan is a healthy eating plan that has been shown to reduce high blood pressure (hypertension). Additional health benefits may include reducing the risk of type 2 diabetes mellitus, heart disease, and stroke. The DASH eating plan may also help with weight loss. WHAT DO I NEED TO KNOW ABOUT THE DASH EATING PLAN? For the DASH eating plan, you will follow these general guidelines:  Choose foods with a percent daily value for sodium of less than 5% (as listed on the food label).  Use salt-free seasonings or herbs instead of table salt or sea salt.  Check with your health care provider or pharmacist before using salt substitutes.  Eat lower-sodium products, often labeled as "lower sodium" or "no salt added."  Eat fresh foods.  Eat more vegetables, fruits, and low-fat dairy products.  Choose whole grains. Look for the word "whole" as the first word in the ingredient list.  Choose fish and skinless chicken or [place] more often than red meat. Limit fish, poultry, and meat to 6 oz (170 g) each day.  Limit sweets, desserts, sugars, and sugary drinks.  Choose heart-healthy fats.  Limit cheese to 1 oz (28 g) per day.  Eat more home-cooked food and less restaurant, buffet, and fast food.  Limit  fried foods.  Cook foods using methods other than frying.  Limit canned vegetables. If you do use them, rinse them well to decrease the sodium.  When eating at a restaurant, ask that your food be prepared with less salt, or no salt if possible. WHAT FOODS CAN I EAT? Seek help from a dietitian for individual calorie needs. Grains Whole grain or whole wheat bread. Brown rice. Whole grain or whole wheat pasta. Quinoa, bulgur, and whole grain cereals. Low-sodium cereals. Corn or whole wheat flour tortillas. Whole grain cornbread. Whole grain crackers. Low-sodium  crackers. Vegetables Fresh or frozen vegetables (raw, steamed, roasted, or grilled). Low-sodium or reduced-sodium tomato and vegetable juices. Low-sodium or reduced-sodium tomato sauce and paste. Low-sodium or reduced-sodium canned vegetables.  Fruits All fresh, canned (in natural juice), or frozen fruits. Meat and Other Protein Products Ground beef (85% or leaner), grass-fed beef, or beef trimmed of fat. Skinless chicken or [place]. Ground chicken or [place]. Pork trimmed of fat. All fish and seafood. Eggs. Dried beans, peas, or lentils. Unsalted nuts and seeds. Unsalted canned beans. Dairy Low-fat dairy products, such as skim or 1% milk, 2% or reduced-fat cheeses, low-fat ricotta or cottage cheese, or plain low-fat yogurt. Low-sodium or reduced-sodium cheeses. Fats and Oils Tub margarines without trans fats. Light or reduced-fat mayonnaise and salad dressings (reduced sodium). Avocado. Safflower, olive, or canola oils. Natural peanut or almond butter. Other Unsalted popcorn and pretzels. The items listed above may not be a complete list of recommended foods or beverages. Contact your dietitian for more options. WHAT FOODS ARE NOT RECOMMENDED? Grains White bread. White pasta. White rice. Refined cornbread. Bagels and croissants. Crackers that contain trans fat. Vegetables Creamed or fried vegetables. Vegetables in a cheese sauce. Regular canned vegetables. Regular canned tomato sauce and paste. Regular tomato and vegetable juices. Fruits Dried fruits. Canned fruit in light or heavy syrup. Fruit juice. Meat and Other Protein Products Fatty cuts of meat. Ribs, chicken wings, bacon, sausage, bologna, salami, chitterlings, fatback, hot dogs, bratwurst, and packaged luncheon meats. Salted nuts and seeds. Canned beans with salt. Dairy Whole or 2% milk, cream, half-and-half, and cream cheese. Whole-fat or sweetened yogurt. Full-fat cheeses or blue cheese. Nondairy creamers and whipped toppings.  Processed cheese, cheese spreads, or cheese curds. Condiments Onion and garlic salt, seasoned salt, table salt, and sea salt. Canned and packaged gravies. Worcestershire sauce. Tartar sauce. Barbecue sauce. Teriyaki sauce. Soy sauce, including reduced sodium. Steak sauce. Fish sauce. Oyster sauce. Cocktail sauce. Horseradish. Ketchup and mustard. Meat flavorings and tenderizers. Bouillon cubes. Hot sauce. Tabasco sauce. Marinades. Taco seasonings. Relishes. Fats and Oils Butter, stick margarine, lard, shortening, ghee, and bacon fat. Coconut, palm kernel, or palm oils. Regular salad dressings. Other Pickles and olives. Salted popcorn and pretzels. The items listed above may not be a complete list of foods and beverages to avoid. Contact your dietitian for more information. WHERE CAN I FIND MORE INFORMATION? National Heart, Lung, and Blood Institute: travelstabloid.com Document Released: 05/09/2011 Document Revised: 10/04/2013 Document Reviewed: 03/24/2013 [facility] Patient Information 2015 [place], [place]. This information is not intended to replace advice given to you by your health care provider. Make sure you discuss any questions you have with your health care provider.

## 2019-07-29 LAB — HEMOGLOBIN A1C
Hgb A1c MFr Bld: 6.2 % of total Hgb — ABNORMAL HIGH (ref <5.7)
Mean Plasma Glucose: 131 (calc)
eAG (mmol/L): 7.3 (calc)

## 2019-07-29 LAB — COMPLETE METABOLIC PANEL WITH GFR
AG Ratio: 1.9 (calc) (ref 1.0–2.5)
ALT: 36 U/L — ABNORMAL HIGH (ref 6–29)
AST: 32 U/L (ref 10–35)
Albumin: 4.6 g/dL (ref 3.6–5.1)
Alkaline phosphatase (APISO): 105 U/L (ref 37–153)
BUN: 12 mg/dL (ref 7–25)
CO2: 26 mmol/L (ref 20–32)
Calcium: 9.9 mg/dL (ref 8.6–10.4)
Chloride: 104 mmol/L (ref 98–110)
Creat: 0.86 mg/dL (ref 0.60–0.93)
GFR, Est African American: 78 mL/min/{1.73_m2} (ref >=60)
GFR, Est Non African American: 67 mL/min/{1.73_m2} (ref >=60)
Globulin: 2.4 g/dL (calc) (ref 1.9–3.7)
Glucose, Bld: 100 mg/dL — ABNORMAL HIGH (ref 65–99)
Potassium: 4.2 mmol/L (ref 3.5–5.3)
Sodium: 140 mmol/L (ref 135–146)
Total Bilirubin: 0.8 mg/dL (ref 0.2–1.2)
Total Protein: 7 g/dL (ref 6.1–8.1)

## 2019-07-29 LAB — LIPID PANEL
Cholesterol: 157 mg/dL (ref <200)
HDL: 46 mg/dL — ABNORMAL LOW (ref >=50)
LDL Cholesterol (Calc): 85 mg/dL (calc)
Non-HDL Cholesterol (Calc): 111 mg/dL (calc) (ref <130)
Total CHOL/HDL Ratio: 3.4 (calc) (ref <5.0)
Triglycerides: 166 mg/dL — ABNORMAL HIGH (ref <150)

## 2019-07-29 LAB — CBC WITH DIFFERENTIAL/PLATELET
Absolute Monocytes: 567 cells/uL (ref 200–950)
Basophils Absolute: 38 cells/uL (ref 0–200)
Basophils Relative: 0.7 %
Eosinophils Absolute: 178 cells/uL (ref 15–500)
Eosinophils Relative: 3.3 %
HCT: 38.4 % (ref 35.0–45.0)
Hemoglobin: 12.6 g/dL (ref 11.7–15.5)
Lymphs Abs: 1636 cells/uL (ref 850–3900)
MCH: 25.9 pg — ABNORMAL LOW (ref 27.0–33.0)
MCHC: 32.8 g/dL (ref 32.0–36.0)
MCV: 79 fL — ABNORMAL LOW (ref 80.0–100.0)
MPV: 9.2 fL (ref 7.5–12.5)
Monocytes Relative: 10.5 %
Neutro Abs: 2981 cells/uL (ref 1500–7800)
Neutrophils Relative %: 55.2 %
Platelets: 230 10*3/uL (ref 140–400)
RBC: 4.86 10*6/uL (ref 3.80–5.10)
RDW: 15.5 % — ABNORMAL HIGH (ref 11.0–15.0)
Total Lymphocyte: 30.3 %
WBC: 5.4 10*3/uL (ref 3.8–10.8)

## 2019-07-29 LAB — TSH: TSH: 0.97 mIU/L (ref 0.40–4.50)

## 2019-07-29 LAB — VITAMIN D 25 HYDROXY (VIT D DEFICIENCY, FRACTURES): Vit D, 25-Hydroxy: 55 ng/mL (ref 30–100)

## 2019-07-29 LAB — MAGNESIUM: Magnesium: 2 mg/dL (ref 1.5–2.5)

## 2019-09-01 ENCOUNTER — Other Ambulatory Visit: Payer: Self-pay

## 2019-09-01 ENCOUNTER — Ambulatory Visit
Admission: RE | Admit: 2019-09-01 | Discharge: 2019-09-01 | Disposition: A | Discharge status: Home / Self Care | Payer: PPO | Source: Ambulatory Visit | Attending: Internal Medicine | Admitting: Internal Medicine

## 2019-09-01 DIAGNOSIS — Z1231 Encounter for screening mammogram for malignant neoplasm of breast: Secondary | ICD-10-CM | POA: Diagnosis not present

## 2019-09-28 ENCOUNTER — Ambulatory Visit: Payer: PPO | Admitting: Physician Assistant

## 2019-10-22 ENCOUNTER — Other Ambulatory Visit: Payer: Self-pay | Admitting: Internal Medicine

## 2019-10-22 DIAGNOSIS — E1122 Type 2 diabetes mellitus with diabetic chronic kidney disease: Secondary | ICD-10-CM

## 2019-10-27 NOTE — Progress Notes (Addendum)
MEDICARE WELLNESS  Assessment:    Encounter for Medicare annual wellness exam 1 year  Hyperlipidemia associated with type 2 diabetes mellitus ([place]) -     Ambulatory referral to Podiatry -     Lipid panel check lipids decrease fatty foods increase activity.   CKD stage 2 due to type 2 diabetes mellitus (HCC) -     Hemoglobin A1c -     glucose blood test strip; Test sugar once a day DX diabetes  Depression, major, recurrent, in partial remission ([place]) - continue medications, stress management techniques discussed, increase water, good sleep hygiene discussed, increase exercise, and increase veggies.   Type 2 diabetes mellitus with stage 2 chronic kidney disease, without long-term current use of insulin (HCC) -     Hemoglobin A1c -     glucose blood test strip; Test sugar once a day DX diabetes Discussed general issues about diabetes pathophysiology and management., Educational material distributed., Suggested low cholesterol diet., Encouraged aerobic exercise., Discussed foot care., Reminded to get yearly retinal exam.  Essential hypertension -     CBC with Differential/Platelet -     COMPLETE METABOLIC PANEL WITH GFR -     TSH  Medication management -     Magnesium  Vitamin D deficiency Continue supplement  Cerebrovascular disease Control blood pressure, cholesterol, glucose, increase exercise.   Sleep apnea, unspecified type Last sleep study 12/2017. [facility].   Fatty liver Monitor LFTs Weight loss advised  Anemia, unspecified type Monitor CBC  Overweight (BMI 25.0-29.9)  Overweight  - long discussion about weight loss, diet, and exercise -recommended diet heavy in fruits and veggies and low in animal meats, cheeses, and dairy products  Idiopathic gout, unspecified chronicity, unspecified site Monitor  History of hyperthyroidism Continue to check TSH  COLONIC POLYPS, ADENOMATOUS, HX OF Due 2024  Gastroesophageal reflux disease, unspecified whether  esophagitis present Continue PPI/H2 blocker, diet discussed  Vitiligo Monitor  Anxiety Monitor  Toenail fungus -     Ambulatory referral to Podiatry  Vertigo -     hydrochlorothiazide (HYDRODIURIL) 12.5 MG tablet; Take 1 tablet (12.5 mg total) by mouth daily. -     meclizine (ANTIVERT) 25 MG tablet; 1/2-1 pill up to 3 times daily for motion sickness/dizziness Normal neuro She was informed to call 911 if she develop any new symptoms such as worsening headaches, episodes of blurred vision, double vision or complete loss of vision or speech difficulties or motor weakness.  Left cervical lymphadenopathy -     US Soft Tissue Head/Neck (NON-THYROID); Future Abnormal left lymphadenopathy- will get [place]    Over 30 minutes of exam, counseling, chart review and critical decision making was performed Future Appointments  Date Time Provider [place]  11/10/2019 10:00 AM [facility] [place] 1 [facility]  11/29/2019 11:00 AM Bronson Ing, DPM [facility]  12/13/2019  8:30 AM Ward Givens, NP [facility] None  02/28/2020 10:00 AM [name], MD [facility] None  11/13/2020  2:00 PM Vicie Mutters, PA-C [facility] None   MEDICARE WELLNESS OBJECTIVES: Physical activity: Current Exercise Habits: Home exercise routine(does yard work like cutting wood/trees), Type of exercise: exercise ball;walking, Time (Minutes): 30, Frequency (Times/Week): 4, Weekly Exercise (Minutes/Week): 120, Intensity: Mild Cardiac risk factors: Cardiac Risk Factors include: advanced age (>53mn, >>20women);diabetes mellitus;dyslipidemia;hypertension;sedentary lifestyle Depression/mood screen:   [facility] 11/03/2019  Decreased Interest 1  Down, Depressed, Hopeless 0  PHQ - 2 Score 1  Altered sleeping 1  Tired, decreased energy 0  Change in appetite 0  Feeling bad or failure about yourself  0  Trouble concentrating 0  Moving slowly or fidgety/restless 0  Suicidal thoughts 0  PHQ-9  Score 2  Difficult doing work/chores Not difficult at all    ADLs:  In your present state of health, do you have any difficulty performing the following activities: 11/02/2019 01/11/2019  Hearing? N N  Vision? N N  Difficulty concentrating or making decisions? N N  Walking or climbing stairs? N N  Dressing or bathing? - N  Doing errands, shopping? N N  Some recent data might be hidden     Cognitive Testing  Alert? Yes  Normal Appearance?Yes  Oriented to person? Yes  Place? Yes   Time? Yes  Recall of three objects?  Yes  Can perform simple calculations? Yes  Displays appropriate judgment?Yes  Can read the correct time from a watch face?Yes  EOL planning: Does Patient Have a Medical Advance Directive?: Yes Type of Advance Directive: Healthcare Power of Attorney, Living will Does patient want to make changes to medical advance directive?: No - Patient declined Copy of [place] in Chart?: Yes - validated most recent copy scanned in chart (See row information) Yes- copy in chart 2017   Medicare Attestation I have personally reviewed: The patient's medical and social history Their use of alcohol, tobacco or illicit drugs Their current medications and supplements The patient's functional ability including ADLs,fall risks, home safety risks, cognitive, and hearing and visual impairment Diet and physical activities Evidence for depression or mood disorders  The patient's weight, height, BMI, and visual acuity have been recorded in the chart.  I have made referrals, counseling, and provided education to the patient based on review of the above and I have provided the patient with a written personalized care plan for preventive services.     Subjective:  Michele [name] is a 74 y.o. female who presents for 3 month follow up and medicare wellness visit.   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 Worse with turning her head, looking  quickly. States head feels loose. She denies any associated neurological complications or symptoms, such as one-sided weakness, numbness, tingling, slurring of speech, droopy face, swallowing difficulties, diplopia, vision loss, hearing loss or tinnitus.   BMI is Body mass index is 27.81 kg/m., she has not been working on diet and exercise. Wt Readings from Last 3 Encounters:  11/02/19 157 lb (71.2 kg)  07/28/19 158 lb (71.7 kg)  04/15/19 155 lb 9.6 oz (70.6 kg)    Her blood pressure has been controlled at home, renal US 2014,  she is on benicar 25m, verapamil 240, minoxidil 10 mg , she will take 1/2 of 0.2 clonidine if her systolic is above 1353 will do this once a week or less often, today their BP is BP: 140/72. At home it is 128/80, very rare it is up.   She does have OSA, is on CPAP at least 4 hours a night but it will give her a headache. Last sleep study 12/2017. [facility].   She has right toenail fungus, was on terbinafine for a year without help, will send to podiatry.   She does workout (works on a farm) She denies chest pain, shortness of breath. She will occ take a clonodine if her BP is over 160, very rare, in the late evening if it happens.   She has not been working on diet and exercise for T2 diabetes With CKD  0 With hyperlipidemia crestor 20 mg M, W, S and denies myalgias, NOT at goal less than 68 DEE Dr. Celene Squibb June/July 2021  well controlled with metformin with recent A1C diabetic range sugar was 106 this AM highest is 120 in the morning  denies increased appetite, nausea, paresthesia of the feet, polydipsia, polyuria, visual disturbances and vomiting.  Last A1C in the office was:  Lab Results  Component Value Date   HGBA1C 5.7 (H) 11/02/2019   Lab Results  Component Value Date   CHOL 141 11/02/2019   HDL 42 (L) 11/02/2019   LDLCALC 69 11/02/2019   TRIG 241 (H) 11/02/2019   CHOLHDL 3.4 11/02/2019   Last GFR: Lab Results  Component Value Date   GFRNONAA  69 11/02/2019   Patient is on Vitamin D supplement and was near goal of 70 at last check:    Lab Results  Component Value Date   VD25OH 55 07/28/2019     She has a documented history of hyperthyroidism. She is not currently on medications for thyroid, last TSH was within normal range:    Lab Results  Component Value Date   TSH 0.85 11/02/2019   Patient is not on allopurinol for gout and does not report a recent flare.  Lab Results  Component Value Date   LABURIC 6.3 01/12/2019     Medication Review:  Current Outpatient Medications (Endocrine & Metabolic):  .  metFORMIN (GLUCOPHAGE) 500 MG tablet, Take 1 tablet 2 x /day with Breakfast & Lunch &   2 tablets with Supper for Diabetes  Current Outpatient Medications (Cardiovascular):  .  cloNIDine (CATAPRES) 0.2 MG tablet, Take 1 tablet (0.2 mg total) by mouth as needed. Takes if systolic BP is over 338. .  minoxidil (LONITEN) 10 MG tablet, Take 1/2 to 1 tablet Daily for BP .  olmesartan (BENICAR) 40 MG tablet, Take 1 tablet Daily in the evening for BP .  rosuvastatin (CRESTOR) 20 MG tablet, TAKE 1 TABLET BY MOUTH ONCE DAILY AT  6PM .  verapamil (CALAN-SR) 240 MG CR tablet, Take 1 tablet Daily with food for BP .  hydrochlorothiazide (HYDRODIURIL) 12.5 MG tablet, Take 1 tablet (12.5 mg total) by mouth daily.   Current Outpatient Medications (Analgesics):  .  aspirin 325 MG EC tablet, Take 325 mg by mouth daily.  Current Outpatient Medications (Hematological):  Marland Kitchen  Cyanocobalamin (VITAMIN B-12 PO), Take 1 tablet by mouth daily.  Current Outpatient Medications (Other):  Marland Kitchen  APPLE CIDER VINEGAR PO, Take by mouth. Takes daily. Marland Kitchen  BIOTIN PO, Take 1 tablet by mouth daily. .  blood glucose meter kit and supplies, Test sugar once a day. .  Blood Glucose Monitoring Suppl DEVI, Test sugars once daily.DX diabetes .  Cholecalciferol (VITAMIN D PO), Take 10,000 Units by mouth daily.  .  Cinnamon 500 MG capsule, Take 500 mg by mouth daily. Marland Kitchen   glucose blood test strip, Test sugar once a day DX diabetes .  Lancets MISC, Test blood sugar once daily .  Magnesium 500 MG TABS, Take 1 tablet by mouth 3 (three) times daily. .  magnesium hydroxide (MILK OF MAGNESIA) 400 MG/5ML suspension, Take by mouth daily as needed for mild constipation. .  Omega-3 Fatty Acids (FISH OIL PO), Take 1 capsule by mouth daily. .  polyethylene glycol (MIRALAX / GLYCOLAX) packet, Take 17 g by mouth as needed.  .  senna (SENOKOT) 8.6 MG tablet, Take 1 tablet by mouth daily. .  sertraline (ZOLOFT) 50 MG tablet,  Take 1 tablet Daily for Mood .  meclizine (ANTIVERT) 25 MG tablet, 1/2-1 pill up to 3 times daily for motion sickness/dizziness   Current Problems (verified) Patient Active Problem List   Diagnosis Date Noted  . Depression, major, recurrent, in partial remission ([place]) 11/02/2019  . Anxiety 12/10/2018  . Sleep apnea on CPAP 09/18/2018  . Enrolled in chronic care management 09/18/2018  . Vitiligo 11/09/2017  . Encounter for Medicare annual wellness exam 07/24/2017  . Type 2 diabetes mellitus ([place]) 07/24/2017  . Overweight (BMI 25.0-29.9) 07/24/2017  . Cerebrovascular disease 01/02/2017  . History of hyperthyroidism 09/11/2015  . Gout 09/22/2014  . Vitamin D deficiency 06/28/2013  . Medication management 06/28/2013  . Anemia 06/28/2013  . CKD stage 2 due to type 2 diabetes mellitus ([place])   . Hyperlipidemia associated with type 2 diabetes mellitus ([place]) 03/21/2008  . Essential hypertension 03/21/2008  . GERD 03/21/2008  . Fatty liver 03/21/2008  . COLONIC POLYPS, ADENOMATOUS, HX OF 03/21/2008   Allergies Allergies  Allergen Reactions  . Lipitor [Atorvastatin]     Elevates LFT's   Health Maintenance  Topic Date Due  . OPHTHALMOLOGY EXAM  07/08/2018  . INFLUENZA VACCINE  01/02/2020  . HEMOGLOBIN A1C  05/03/2020  . FOOT EXAM  11/01/2020  . MAMMOGRAM  08/31/2021  . COLONOSCOPY  02/11/2023  . TETANUS/TDAP  03/26/2023  . DEXA SCAN   Completed  . COVID-19 Vaccine  Completed  . Hepatitis C Screening  Completed  . PNA vac Low Risk Adult  Completed    DEXA: 01/2018  Influenza: 2020  Shingles/Zostavax: 2007 Shingrix: discussed  Patient Care Team: [name], MD as PCP - General (Internal Medicine)   SURGICAL HISTORY She  has a past surgical history that includes Appendectomy; Tonsillectomy (1952); Carpal tunnel release (Right, 1992); Cyst removal hand; cataract extration; Colonoscopy; and Breast biopsy (Right). FAMILY HISTORY Her family history includes Cerebral aneurysm in her sister; Colon polyps in her mother; Diabetes in her father and mother; Heart attack in her brother and father; Heart disease in her brother, brother, father, and mother. SOCIAL HISTORY She  reports that she has never smoked. She has never used smokeless tobacco. She reports that she does not drink alcohol or use drugs.     Review of Systems  Constitutional: Negative for malaise/fatigue and weight loss.  HENT: Negative for ear pain (Intermittent, R ear, improved on allergy medication), hearing loss and tinnitus.   Eyes: Negative for blurred vision and double vision.  Respiratory: Negative for cough, shortness of breath and wheezing.   Cardiovascular: Negative for chest pain, palpitations, orthopnea, claudication and leg swelling.  Gastrointestinal: Negative for abdominal pain, blood in stool, constipation, diarrhea, heartburn, melena, nausea and vomiting.  Genitourinary: Negative.   Musculoskeletal: Negative for falls, joint pain and myalgias.  Skin: Negative for rash.  Neurological: Positive for dizziness. Negative for tingling, tremors, sensory change, speech change, focal weakness, seizures, loss of consciousness, weakness and headaches.  Endo/Heme/Allergies: Negative for polydipsia.  Psychiatric/Behavioral: Negative for depression, memory loss and substance abuse. The patient is not nervous/anxious and does not have insomnia.    All other systems reviewed and are negative.   Objective:     Today's Vitals   11/02/19 1410  BP: 140/72  Pulse: 72  Temp: 97.7 F (36.5 C)  SpO2: 98%  Weight: 157 lb (71.2 kg)  Height: '5\' 3"'  (1.6 m)  PainSc: 0-No pain   Body mass index is 27.81 kg/m. General Appearance: Well nourished, in no apparent distress.  Eyes: conjunctiva no swelling or erythema ENT/Mouth: No erythema, swelling, or exudate on post pharynx.  Tonsils not swollen or erythematous. Hearing normal.  Neck: Supple Respiratory: Respiratory effort normal, BS equal bilaterally without rales, rhonchi, wheezing or stridor.  Cardio: RRR with no holosystolic murmur. Brisk peripheral pulses without edema.  Abdomen: Soft, + BS.  Non tender, no guarding, rebound, hernias, masses. Lymphatics: + left sided non tender cervical lymphadenopathy.  Musculoskeletal: Symmetrical strength, normal gait.  Skin: Warm, dry without rashes, lesions, ecchymosis. Bilateral big toe nails with thickening, brittle and yellow, no erythema.  Neuro: Cranial nerves intact. Normal muscle tone, no cerebellar symptoms. Sensation intact.  Psych: Awake and oriented X 3, normal affect, Insight and Judgment appropriate.    Vicie Mutters, PA-C   11/03/2019

## 2019-11-02 ENCOUNTER — Other Ambulatory Visit: Payer: Self-pay

## 2019-11-02 ENCOUNTER — Encounter: Payer: Self-pay | Admitting: Physician Assistant

## 2019-11-02 ENCOUNTER — Ambulatory Visit (INDEPENDENT_AMBULATORY_CARE_PROVIDER_SITE_OTHER): Payer: PPO | Admitting: Physician Assistant

## 2019-11-02 VITALS — BP 140/72 | HR 72 | Temp 97.7°F | Ht 63.0 in | Wt 157.0 lb

## 2019-11-02 DIAGNOSIS — Z79899 Other long term (current) drug therapy: Secondary | ICD-10-CM

## 2019-11-02 DIAGNOSIS — M1000 Idiopathic gout, unspecified site: Secondary | ICD-10-CM | POA: Diagnosis not present

## 2019-11-02 DIAGNOSIS — K760 Fatty (change of) liver, not elsewhere classified: Secondary | ICD-10-CM | POA: Diagnosis not present

## 2019-11-02 DIAGNOSIS — L80 Vitiligo: Secondary | ICD-10-CM

## 2019-11-02 DIAGNOSIS — I679 Cerebrovascular disease, unspecified: Secondary | ICD-10-CM

## 2019-11-02 DIAGNOSIS — E1122 Type 2 diabetes mellitus with diabetic chronic kidney disease: Secondary | ICD-10-CM

## 2019-11-02 DIAGNOSIS — Z0001 Encounter for general adult medical examination with abnormal findings: Secondary | ICD-10-CM

## 2019-11-02 DIAGNOSIS — G4730 Sleep apnea, unspecified: Secondary | ICD-10-CM

## 2019-11-02 DIAGNOSIS — E663 Overweight: Secondary | ICD-10-CM | POA: Diagnosis not present

## 2019-11-02 DIAGNOSIS — R590 Localized enlarged lymph nodes: Secondary | ICD-10-CM

## 2019-11-02 DIAGNOSIS — Z8639 Personal history of other endocrine, nutritional and metabolic disease: Secondary | ICD-10-CM | POA: Diagnosis not present

## 2019-11-02 DIAGNOSIS — Z860100 Personal history of colon polyps, unspecified: Secondary | ICD-10-CM

## 2019-11-02 DIAGNOSIS — E1169 Type 2 diabetes mellitus with other specified complication: Secondary | ICD-10-CM

## 2019-11-02 DIAGNOSIS — E785 Hyperlipidemia, unspecified: Secondary | ICD-10-CM

## 2019-11-02 DIAGNOSIS — R42 Dizziness and giddiness: Secondary | ICD-10-CM

## 2019-11-02 DIAGNOSIS — R6889 Other general symptoms and signs: Secondary | ICD-10-CM | POA: Diagnosis not present

## 2019-11-02 DIAGNOSIS — Z0000 Encounter for general adult medical examination without abnormal findings: Secondary | ICD-10-CM

## 2019-11-02 DIAGNOSIS — D649 Anemia, unspecified: Secondary | ICD-10-CM

## 2019-11-02 DIAGNOSIS — E559 Vitamin D deficiency, unspecified: Secondary | ICD-10-CM

## 2019-11-02 DIAGNOSIS — B351 Tinea unguium: Secondary | ICD-10-CM

## 2019-11-02 DIAGNOSIS — N182 Chronic kidney disease, stage 2 (mild): Secondary | ICD-10-CM

## 2019-11-02 DIAGNOSIS — I10 Essential (primary) hypertension: Secondary | ICD-10-CM | POA: Diagnosis not present

## 2019-11-02 DIAGNOSIS — F419 Anxiety disorder, unspecified: Secondary | ICD-10-CM

## 2019-11-02 DIAGNOSIS — F3341 Major depressive disorder, recurrent, in partial remission: Secondary | ICD-10-CM

## 2019-11-02 DIAGNOSIS — K219 Gastro-esophageal reflux disease without esophagitis: Secondary | ICD-10-CM

## 2019-11-02 MED ORDER — GLUCOSE BLOOD VI STRP: ORAL_STRIP | 12 refills | Status: DC

## 2019-11-02 MED ORDER — MECLIZINE HCL 25 MG PO TABS: ORAL_TABLET | ORAL | 0 refills | Status: DC

## 2019-11-02 MED ORDER — HYDROCHLOROTHIAZIDE 12.5 MG PO TABS: 12.5000 mg | ORAL_TABLET | Freq: Every day | ORAL | 3 refills | Status: DC

## 2019-11-02 NOTE — Patient Instructions (Addendum)
If you are not taking the alprazolam which is an addictive medication Then you can stop the sertaline that you were on for anxiety/depression, sertaline is NOT habit forming or addictive If you feel that you are getting more anxious we can get you back on the sertaline OR we can try a medication called buspirone for anxiety that is AS needed and not habit forming.   YOU CAN CALL TO MAKE AN ULTRASOUND..  I have put in an order for an ultrasound for you to have You can set them up at your convenience by calling this number [phone] You will likely have the ultrasound at [facility]  If you have any issues call our office and we will set this up for you.    Will start on very low dose of fluid pill 12.5mg  in the morning Can take mecilizine at night for vertigo   However if you have worsening HA, changes vision/speech, weakness go to the ER  Home treatments: The Brandt-Daroff Exercises are a home method of treating BPPV,and are effective 95% of the time.  These exercises are performed in three sets per day for two weeks. In each set, one performs the maneuver as shown five times. Start sitting upright (position 1). Then move into the side-lying position (position 2), with the head angled upward about halfway. An easy way to remember this is to imagine someone standing about 6 feet in front of you, and just keep looking at their head at all times. Stay in the side-lying position for 30 seconds, or until the dizziness subsides if this is longer, then go back to the sitting position (position 3). Stay there for 30 seconds, and then go to the opposite side (position 4) and follow the same routine.  At home Epley Maneuver This procedure seems to be even more effective than the in-office procedure, perhaps because it is repeated every night for a week.  The method (for the left side) is performed as shown on the figure below.  1) One stays in each of the supine (lying down)  positions for 30 seconds, and in the sitting upright position (top) for 1 minute.  2) Thus, once cycle takes 2 1/2 minutes.  3) Typically 3 cycles are performed just prior to going to sleep.  4) It is best to do them at night rather than in the morning or midday, as if one becomes dizzy following the exercises, then it can resolve while one is sleeping.  The mirror image of this procedure is used for the right ear.  Ask insurance and pharmacy about shingrix - it is a 2 part shot that we will not be getting in the office.   Suggest getting AFTER covid vaccines, have to wait at least a month This shot can make you feel bad due to such good immune response it can trigger some inflammation so take tylenol or aleve day of or day after and plan on resting.   Can go to [email or web address] for more information  Shingrix Vaccination  Two vaccines are licensed and recommended to prevent shingles in the [place] Zoster vaccine live (ZVL, Zostavax) has been in use since 2006. Recombinant zoster vaccine (RZV, Shingrix), has been in use since 2017 and is recommended by [employer] as the preferred shingles vaccine.  What Everyone Should Know about Shingles Vaccine (Shingrix) One of the Recommended Vaccines by Disease Shingles vaccination is the only way to protect against shingles and postherpetic neuralgia (PHN),  the most common complication from shingles. CDC recommends that healthy adults 50 years and older get two doses of the shingles vaccine called Shingrix (recombinant zoster vaccine), separated by 2 to 6 months, to prevent shingles and the complications from the disease. Your doctor or pharmacist can give you Shingrix as a shot in your upper arm. Shingrix provides strong protection against shingles and PHN. Two doses of Shingrix is more than 90% effective at preventing shingles and PHN. Protection stays above 85% for at least the first four years after you get  vaccinated. Shingrix is the preferred vaccine, over Zostavax (zoster vaccine live), a shingles vaccine in use since 2006. Zostavax may still be used to prevent shingles in healthy adults 60 years and older. For example, you could use Zostavax if a person is allergic to Shingrix, prefers Zostavax, or requests immediate vaccination and Shingrix is unavailable. Who Should Get Shingrix? Healthy adults 50 years and older should get two doses of Shingrix, separated by 2 to 6 months. You should get Shingrix even if in the past you . had shingles  . received Zostavax  . are not sure if you had chickenpox There is no maximum age for getting Shingrix. If you had shingles in the past, you can get Shingrix to help prevent future occurrences of the disease. There is no specific length of time that you need to wait after having shingles before you can receive Shingrix, but generally you should make sure the shingles rash has gone away before getting vaccinated. You can get Shingrix whether or not you remember having had chickenpox in the past. Studies show that more than 99% of Americans 40 years and older have had chickenpox, even if they don't remember having the disease. Chickenpox and shingles are related because they are caused by the same virus (varicella zoster virus). After a person recovers from chickenpox, the virus stays dormant (inactive) in the body. It can reactivate years later and cause shingles. If you had Zostavax in the recent past, you should wait at least eight weeks before getting Shingrix. Talk to your healthcare provider to determine the best time to get Shingrix. Shingrix is available in [facility] and pharmacies. To find doctor's offices or pharmacies near you that offer the vaccine, visit [employer] Vaccine FinderExternal. If you have questions about Shingrix, talk with your healthcare provider. Vaccine for Those 9 Years and Older  Shingrix reduces the risk of shingles and PHN by  more than 90% in people 15 and older. CDC recommends the vaccine for healthy adults 58 and older.  Who Should Not Get Shingrix? You should not get Shingrix if you: . have ever had a severe allergic reaction to any component of the vaccine or after a dose of Shingrix  . tested negative for immunity to varicella zoster virus. If you test negative, you should get chickenpox vaccine.  . currently have shingles  . currently are pregnant or breastfeeding. Women who are pregnant or breastfeeding should wait to get Shingrix.  Marland Kitchen receive specific antiviral drugs (acyclovir, famciclovir, or valacyclovir) 24 hours before vaccination (avoid use of these antiviral drugs for 14 days after vaccination)- zoster vaccine live only If you have a minor acute (starts suddenly) illness, such as a cold, you may get Shingrix. But if you have a moderate or severe acute illness, you should usually wait until you recover before getting the vaccine. This includes anyone with a temperature of 101.66F or higher. The side effects of the Shingrix are temporary, and usually  last 2 to 3 days. While you may experience pain for a few days after getting Shingrix, the pain will be less severe than having shingles and the complications from the disease. How Well Does Shingrix Work? Two doses of Shingrix provides strong protection against shingles and postherpetic neuralgia (PHN), the most common complication of shingles. . In adults 80 to 74 years old who got two doses, Shingrix was 97% effective in preventing shingles; among adults 70 years and older, Shingrix was 91% effective.  . In adults 84 to 74 years old who got two doses, Shingrix was 91% effective in preventing PHN; among adults 70 years and older, Shingrix was 89% effective. Shingrix protection remained high (more than 85%) in people 70 years and older throughout the four years following vaccination. Since your risk of shingles and PHN increases as you get older, it is important  to have strong protection against shingles in your older years. Top of Page  What Are the Possible Side Effects of Shingrix? Studies show that Shingrix is safe. The vaccine helps your body create a strong defense against shingles. As a result, you are likely to have temporary side effects from getting the shots. The side effects may affect your ability to do normal daily activities for 2 to 3 days. Most people got a sore arm with mild or moderate pain after getting Shingrix, and some also had redness and swelling where they got the shot. Some people felt tired, had muscle pain, a headache, shivering, fever, stomach pain, or nausea. About 1 out of 6 people who got Shingrix experienced side effects that prevented them from doing regular activities. Symptoms went away on their own in about 2 to 3 days. Side effects were more common in younger people. You might have a reaction to the first or second dose of Shingrix, or both doses. If you experience side effects, you may choose to take over-the-counter pain medicine such as ibuprofen or acetaminophen. If you experience side effects from Shingrix, you should report them to the Vaccine Adverse Event Reporting System (VAERS). Your doctor might file this report, or you can do it yourself through the VAERS websiteExternal, or by calling [phone]. If you have any questions about side effects from Shingrix, talk with your doctor. The shingles vaccine does not contain thimerosal (a preservative containing mercury). Top of Page  When Should I See a Doctor Because of the Side Effects I Experience From Shingrix? In clinical trials, Shingrix was not associated with serious adverse events. In fact, serious side effects from vaccines are extremely rare. For example, for every 1 million doses of a vaccine given, only one or two people may have a severe allergic reaction. Signs of an allergic reaction happen within minutes or hours after vaccination and include hives,  swelling of the face and throat, difficulty breathing, a fast heartbeat, dizziness, or weakness. If you experience these or any other life-threatening symptoms, see a doctor right away. Shingrix causes a strong response in your immune system, so it may produce short-term side effects more intense than you are used to from other vaccines. These side effects can be uncomfortable, but they are expected and usually go away on their own in 2 or 3 days. Top of Page  How Can I Pay For Shingrix? There are several ways shingles vaccine may be paid for: Medicare . Medicare Part D plans cover the shingles vaccine, but there may be a cost to you depending on your plan. There may be a copay  for the vaccine, or you may need to pay in full then get reimbursed for a certain amount.  . Medicare Part B does not cover the shingles vaccine. Medicaid . Medicaid may or may not cover the vaccine. Contact your insurer to find out. Private health insurance . Many private health insurance plans will cover the vaccine, but there may be a cost to you depending on your plan. Contact your insurer to find out. Vaccine assistance programs . Some pharmaceutical companies provide vaccines to eligible adults who cannot afford them. You may want to check with the vaccine manufacturer, [employer], about Shingrix. If you do not currently have health insurance, learn more about affordable health coverage optionsExternal. To find doctor's offices or pharmacies near you that offer the vaccine, visit [facility]Map Vaccine FinderExternal.

## 2019-11-03 LAB — CBC WITH DIFFERENTIAL/PLATELET
Absolute Monocytes: 549 cells/uL (ref 200–950)
Basophils Absolute: 22 cells/uL (ref 0–200)
Basophils Relative: 0.4 %
Eosinophils Absolute: 118 cells/uL (ref 15–500)
Eosinophils Relative: 2.1 %
HCT: 37.8 % (ref 35.0–45.0)
Hemoglobin: 12.3 g/dL (ref 11.7–15.5)
Lymphs Abs: 1529 cells/uL (ref 850–3900)
MCH: 25.8 pg — ABNORMAL LOW (ref 27.0–33.0)
MCHC: 32.5 g/dL (ref 32.0–36.0)
MCV: 79.4 fL — ABNORMAL LOW (ref 80.0–100.0)
MPV: 9.2 fL (ref 7.5–12.5)
Monocytes Relative: 9.8 %
Neutro Abs: 3382 cells/uL (ref 1500–7800)
Neutrophils Relative %: 60.4 %
Platelets: 204 10*3/uL (ref 140–400)
RBC: 4.76 10*6/uL (ref 3.80–5.10)
RDW: 14.7 % (ref 11.0–15.0)
Total Lymphocyte: 27.3 %
WBC: 5.6 10*3/uL (ref 3.8–10.8)

## 2019-11-03 LAB — COMPLETE METABOLIC PANEL WITH GFR
AG Ratio: 1.8 (calc) (ref 1.0–2.5)
ALT: 30 U/L — ABNORMAL HIGH (ref 6–29)
AST: 27 U/L (ref 10–35)
Albumin: 4.3 g/dL (ref 3.6–5.1)
Alkaline phosphatase (APISO): 111 U/L (ref 37–153)
BUN: 11 mg/dL (ref 7–25)
CO2: 27 mmol/L (ref 20–32)
Calcium: 9.6 mg/dL (ref 8.6–10.4)
Chloride: 106 mmol/L (ref 98–110)
Creat: 0.84 mg/dL (ref 0.60–0.93)
GFR, Est African American: 80 mL/min/{1.73_m2} (ref >=60)
GFR, Est Non African American: 69 mL/min/{1.73_m2} (ref >=60)
Globulin: 2.4 g/dL (calc) (ref 1.9–3.7)
Glucose, Bld: 138 mg/dL — ABNORMAL HIGH (ref 65–99)
Potassium: 4.4 mmol/L (ref 3.5–5.3)
Sodium: 142 mmol/L (ref 135–146)
Total Bilirubin: 0.6 mg/dL (ref 0.2–1.2)
Total Protein: 6.7 g/dL (ref 6.1–8.1)

## 2019-11-03 LAB — LIPID PANEL
Cholesterol: 141 mg/dL (ref <200)
HDL: 42 mg/dL — ABNORMAL LOW (ref >=50)
LDL Cholesterol (Calc): 69 mg/dL (calc)
Non-HDL Cholesterol (Calc): 99 mg/dL (calc) (ref <130)
Total CHOL/HDL Ratio: 3.4 (calc) (ref <5.0)
Triglycerides: 241 mg/dL — ABNORMAL HIGH (ref <150)

## 2019-11-03 LAB — HEMOGLOBIN A1C
Hgb A1c MFr Bld: 5.7 % of total Hgb — ABNORMAL HIGH (ref <5.7)
Mean Plasma Glucose: 117 (calc)
eAG (mmol/L): 6.5 (calc)

## 2019-11-03 LAB — MAGNESIUM: Magnesium: 1.9 mg/dL (ref 1.5–2.5)

## 2019-11-03 LAB — TSH: TSH: 0.85 mIU/L (ref 0.40–4.50)

## 2019-11-04 ENCOUNTER — Other Ambulatory Visit: Payer: Self-pay | Admitting: Internal Medicine

## 2019-11-04 DIAGNOSIS — I10 Essential (primary) hypertension: Secondary | ICD-10-CM

## 2019-11-10 ENCOUNTER — Other Ambulatory Visit: Payer: PPO

## 2019-11-12 ENCOUNTER — Ambulatory Visit
Admission: RE | Admit: 2019-11-12 | Discharge: 2019-11-12 | Disposition: A | Discharge status: Home / Self Care | Payer: PPO | Source: Ambulatory Visit | Attending: Physician Assistant | Admitting: Physician Assistant

## 2019-11-12 DIAGNOSIS — R590 Localized enlarged lymph nodes: Secondary | ICD-10-CM

## 2019-11-17 ENCOUNTER — Other Ambulatory Visit: Payer: Self-pay

## 2019-11-17 DIAGNOSIS — H18513 Endothelial corneal dystrophy, bilateral: Secondary | ICD-10-CM | POA: Diagnosis not present

## 2019-11-17 DIAGNOSIS — E119 Type 2 diabetes mellitus without complications: Secondary | ICD-10-CM | POA: Diagnosis not present

## 2019-11-17 DIAGNOSIS — Z961 Presence of intraocular lens: Secondary | ICD-10-CM | POA: Diagnosis not present

## 2019-11-17 DIAGNOSIS — I10 Essential (primary) hypertension: Secondary | ICD-10-CM

## 2019-11-17 DIAGNOSIS — H52203 Unspecified astigmatism, bilateral: Secondary | ICD-10-CM | POA: Diagnosis not present

## 2019-11-17 LAB — HM DIABETES EYE EXAM

## 2019-11-17 MED ORDER — CLONIDINE HCL 0.2 MG PO TABS: 0.2000 mg | ORAL_TABLET | ORAL | 1 refills | Status: DC | PRN

## 2019-11-24 ENCOUNTER — Encounter: Payer: Self-pay | Admitting: *Deleted

## 2019-11-24 DIAGNOSIS — G4733 Obstructive sleep apnea (adult) (pediatric): Secondary | ICD-10-CM | POA: Diagnosis not present

## 2019-11-29 ENCOUNTER — Ambulatory Visit: Payer: PPO | Admitting: Podiatrist

## 2019-11-29 ENCOUNTER — Other Ambulatory Visit: Payer: Self-pay

## 2019-11-29 VITALS — Temp 96.5°F

## 2019-11-29 DIAGNOSIS — L603 Nail dystrophy: Secondary | ICD-10-CM | POA: Diagnosis not present

## 2019-11-29 DIAGNOSIS — B351 Tinea unguium: Secondary | ICD-10-CM | POA: Diagnosis not present

## 2019-11-29 DIAGNOSIS — L03031 Cellulitis of right toe: Secondary | ICD-10-CM | POA: Diagnosis not present

## 2019-11-29 NOTE — Patient Instructions (Signed)
Fungal Nail Infection A fungal nail infection is a common infection of the toenails or fingernails. This condition affects toenails more often than fingernails. It often affects the great, or big, toes. More than one nail may be infected. The condition can be passed from person to person (is contagious). What are the causes? This condition is caused by a fungus. Several types of fungi can cause the infection. These fungi are common in moist and warm areas. If your hands or feet come into contact with the fungus, it may get into a crack in your fingernail or toenail and cause the infection. What increases the risk? The following factors may make you more likely to develop this condition:  Being female.  Being of older age.  Living with someone who has the fungus.  Walking barefoot in areas where the fungus thrives, such as showers or locker rooms.  Wearing shoes and socks that cause your feet to sweat.  Having a nail injury or a recent nail surgery.  Having certain medical conditions, such as: ? Athlete's foot. ? Diabetes. ? Psoriasis. ? Poor circulation. ? A weak body defense system (immune system). What are the signs or symptoms? Symptoms of this condition include:  A pale spot on the nail.  Thickening of the nail.  A nail that becomes yellow or brown.  A brittle or ragged nail edge.  A crumbling nail.  A nail that has lifted away from the nail bed. How is this diagnosed? This condition is diagnosed with a physical exam. Your health care provider may take a scraping or clipping from your nail to test for the fungus. How is this treated? Treatment is not needed for mild infections. If you have significant nail changes, treatment may include:  Antifungal medicines taken by mouth (orally). You may need to take the medicine for several weeks or several months, and you may not see the results for a long time. These medicines can cause side effects. Ask your health care provider  what problems to watch for.  Antifungal nail polish or nail cream. These may be used along with oral antifungal medicines.  Laser treatment of the nail.  Surgery to remove the nail. This may be needed for the most severe infections. It can take a long time, usually up to a year, for the infection to go away. The infection may also come back. Follow these instructions at home: Medicines  Take or apply over-the-counter and prescription medicines only as told by your health care provider.  Ask your health care provider about using over-the-counter mentholated ointment on your nails. Nail care  Trim your nails often.  Wash and dry your hands and feet every day.  Keep your feet dry: ? Wear absorbent socks, and change your socks frequently. ? Wear shoes that allow air to circulate, such as sandals or canvas tennis shoes. Throw out old shoes.  Do not use artificial nails.  If you go to a nail salon, make sure you choose one that uses clean instruments.  Use antifungal foot powder on your feet and in your shoes. General instructions  Do not share personal items, such as towels or nail clippers.  Do not walk barefoot in shower rooms or locker rooms.  Wear rubber gloves if you are working with your hands in wet areas.  Keep all follow-up visits as told by your health care provider. This is important. Contact a health care provider if: Your infection is not getting better or it is getting worse   after several months. Summary  A fungal nail infection is a common infection of the toenails or fingernails.  Treatment is not needed for mild infections. If you have significant nail changes, treatment may include taking medicine orally and applying medicine to your nails.  It can take a long time, usually up to a year, for the infection to go away. The infection may also come back.  Take or apply over-the-counter and prescription medicines only as told by your health care  provider.  Follow instructions for taking care of your nails to help prevent infection from coming back or spreading. This information is not intended to replace advice given to you by your health care provider. Make sure you discuss any questions you have with your health care provider. Document Revised: 09/10/2018 Document Reviewed: 10/24/2017 Elsevier Patient Education  2020 [employer]  

## 2019-11-30 ENCOUNTER — Encounter: Payer: Self-pay | Admitting: Podiatrist

## 2019-11-30 NOTE — Progress Notes (Signed)
  Chief Complaint  Patient presents with  . Nail Problem    Nail fungus - R hallux. x1.5 yrs. Pt stated, "I've been taking terbinafine. My PCP told me to stop taking it and see a podiatrist if the problem continued".  . Diabetes    Most recent A1c on file = 5.7. No known history of ulcers or neuropathy. Pt stated, "I occasionally have stinging/tingling and itching (plantar forefoot, bilateral)".     HPI: Patient is 74 y.o. female who presents today for the concerns as listed above. She states she has been taking terbinifine for over a year and the right great toenail continues to be thick and does not appear to have improved with the medication.    Review of Systems No fevers, chills, nausea, muscle aches, no difficulty breathing, no calf pain, no chest pain or shortness of breath.   Physical Exam  GENERAL APPEARANCE: Alert, conversant. Appropriately groomed. No acute distress.   VASCULAR: Pedal pulses palpable DP and PT bilateral.  Capillary refill time is immediate to all digits,  Proximal to distal cooling it warm to warm.  Digital perfusion adequate.   NEUROLOGIC: sensation is intact epicritically and protectively to 5.07 monofilament at 5/5 sites bilateral.  Light touch is intact bilateral, vibratory sensation intact bilateral, achilles tendon reflex is intact bilateral.   MUSCULOSKELETAL: acceptable muscle strength, tone and stability bilateral.  No gross boney pedal deformities noted.  No pain, crepitus or limitation noted with foot and ankle range of motion bilateral.   DERMATOLOGIC: skin is warm, supple, and dry.  No open lesions noted.  No rash, no pre ulcerative lesions. Right hallux nail is yellowish in color, brittle, subungual debris is present on the distal portio of the nail.  Remainder of nails appear normal.     Assessment   Nail fungus - Plan: Culture, fungus without smear    Plan  Recommended sending a sample of the toenail to be tested to determine if fungus  is identified and if so, what type of fungus is causing the thickness.  A sample of the distal right hallux nail was obtained.,  We will call with the result of the test and provide treatment recommendations based on the culture results.  Also discussed it could be trauma since the nail has not improved with the oral lamisil.  Will follow up with results.

## 2019-12-11 ENCOUNTER — Other Ambulatory Visit: Payer: Self-pay | Admitting: Adult Health

## 2019-12-11 DIAGNOSIS — E782 Mixed hyperlipidemia: Secondary | ICD-10-CM

## 2019-12-13 ENCOUNTER — Other Ambulatory Visit: Payer: Self-pay

## 2019-12-13 ENCOUNTER — Encounter: Payer: Self-pay | Admitting: Adult Health

## 2019-12-13 ENCOUNTER — Ambulatory Visit: Payer: PPO | Admitting: Adult Health

## 2019-12-13 VITALS — BP 149/73 | HR 71 | Ht 63.0 in | Wt 156.0 lb

## 2019-12-13 DIAGNOSIS — Z9989 Dependence on other enabling machines and devices: Secondary | ICD-10-CM

## 2019-12-13 DIAGNOSIS — G4733 Obstructive sleep apnea (adult) (pediatric): Secondary | ICD-10-CM | POA: Diagnosis not present

## 2019-12-13 NOTE — Progress Notes (Signed)
PATIENT: Michele [name] DOB: 26-May-1946  REASON FOR VISIT: follow up HISTORY FROM: patient  HISTORY OF PRESENT ILLNESS: Today 12/13/19:  [name] is a 74 year old female with a history of obstructive sleep apnea on CPAP.  Her download indicates that she used her machine 29 out of 30 days for compliance of 97%.  She used her machine greater than 4 hours each night.  On average she uses her machine 5 hours and 44 minutes.  Her residual AHI is 1.7 on 5 to 10 cm of water with EPR 3.  Leak in the 95th percentile is 24.1 L/min.  HISTORY 12/10/18 :  [name] is a 74 year old female with a history of obstructive sleep apnea on CPAP.  Her download indicates that she use her machine nightly for compliance of 100%.  She used her machine greater than 4 hours 29 days for compliance of 97%.  On average she uses her machine 6 hours and 8 minutes.  Her residual AHI is 2.5 on 5-10 cmH2O with EPR of 3.  Her leak in the 95th percentile is 27.6 L/min.  She reports that some mornings she will wake up with a headache however she feels that this is irritation from wearing the mask and straps.  She reports that she is not interested in changing the style of mask at this time.  REVIEW OF SYSTEMS: Out of a complete 14 system review of symptoms, the patient complains only of the following symptoms, and all other reviewed systems are negative.  FSS 38 ESS 4  ALLERGIES: Allergies  Allergen Reactions  . Lipitor [Atorvastatin]     Elevates LFT's    HOME MEDICATIONS: Outpatient Medications Prior to Visit  Medication Sig Dispense Refill  . APPLE CIDER VINEGAR PO Take by mouth. Takes daily.    Marland Kitchen aspirin 325 MG EC tablet Take 325 mg by mouth daily.    Marland Kitchen BIOTIN PO Take 1 tablet by mouth daily.    . blood glucose meter kit and supplies Test sugar once a day. 1 each 0  . Blood Glucose Monitoring Suppl DEVI Test sugars once daily.DX diabetes 1 each 0  . Cholecalciferol (VITAMIN D PO) Take 10,000 Units by  mouth daily.     . Cinnamon 500 MG capsule Take 500 mg by mouth daily.    . cloNIDine (CATAPRES) 0.2 MG tablet Take 1 tablet (0.2 mg total) by mouth as needed. Takes if systolic BP is over 277. 90 tablet 1  . Cyanocobalamin (VITAMIN B-12 PO) Take 1 tablet by mouth daily.    Marland Kitchen glucose blood test strip Test sugar once a day DX diabetes 100 each 12  . hydrochlorothiazide (HYDRODIURIL) 12.5 MG tablet Take 1 tablet (12.5 mg total) by mouth daily. 30 tablet 3  . Lancets MISC Test blood sugar once daily 100 each 11  . Magnesium 500 MG TABS Take 1 tablet by mouth 3 (three) times daily.    . metFORMIN (GLUCOPHAGE) 500 MG tablet Take 1 tablet 2 x /day with Breakfast & Lunch &   2 tablets with Supper for Diabetes 360 tablet 0  . minoxidil (LONITEN) 10 MG tablet Take 1/2 to 1 tablet Daily for BP 90 tablet 3  . olmesartan (BENICAR) 40 MG tablet Take 1 tablet Daily for BP 90 tablet 0  . Omega-3 Fatty Acids (FISH OIL PO) Take 1 capsule by mouth daily.    . rosuvastatin (CRESTOR) 20 MG tablet Take 1 tablet Daily for Cholesterol 90 tablet 0  .  senna (SENOKOT) 8.6 MG tablet Take 1 tablet by mouth daily.    . sertraline (ZOLOFT) 50 MG tablet Take 1 tablet Daily for Mood 90 tablet 3  . verapamil (CALAN-SR) 240 MG CR tablet Take 1 tablet Daily with food for BP 90 tablet 3  . magnesium hydroxide (MILK OF MAGNESIA) 400 MG/5ML suspension Take by mouth daily as needed for mild constipation.    . meclizine (ANTIVERT) 25 MG tablet 1/2-1 pill up to 3 times daily for motion sickness/dizziness 30 tablet 0  . polyethylene glycol (MIRALAX / GLYCOLAX) packet Take 17 g by mouth as needed.      No facility-administered medications prior to visit.    PAST MEDICAL HISTORY: Past Medical History:  Diagnosis Date  . Anemia   . Anxiety   . Arthritis   . Carpal tunnel syndrome   . Cerebrovascular disease 01/02/2017  . Duodenal diverticulum   . Esophageal motility disorder   . Fatty liver   . GERD (gastroesophageal reflux  disease)   . Hyperlipidemia   . Hypertension   . 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 Sleep apnea   . Tubulovillous adenoma of colon   . Type II or unspecified type diabetes mellitus without mention of complication, not stated as uncontrolled     PAST SURGICAL HISTORY: Past Surgical History:  Procedure Laterality Date  . APPENDECTOMY    . BREAST BIOPSY Right   . CARPAL TUNNEL RELEASE Right 1992  . cataract extration    . COLONOSCOPY    . CYST REMOVAL HAND    . TONSILLECTOMY  1952    FAMILY HISTORY: Family History  Problem Relation Age of Onset  . Heart disease Mother   . Diabetes Mother   . Colon polyps Mother   . Heart disease Father   . Diabetes Father   . Heart attack Father   . Heart disease Brother   . Heart attack Brother   . Cerebral aneurysm Sister   . Heart disease Brother        Stent  . Colon cancer Neg Hx   . Esophageal cancer Neg Hx   . Stomach cancer Neg Hx   . Rectal cancer Neg Hx   . Breast cancer Neg Hx     SOCIAL HISTORY: Social History   Socioeconomic History  . Marital status: Married    Spouse name: Trilby Drummer  . Number of children: 1  . Years of education: 67  . Highest education level: Not on file  Occupational History  . Occupation: Retired    Fish farm manager: [employer]  Tobacco Use  . Smoking status: Never Smoker  . Smokeless tobacco: Never Used  Vaping Use  . Vaping Use: Never used  Substance and Sexual Activity  . Alcohol use: No  . Drug use: No  . Sexual activity: Never    Partners: Male    Comment: Married  Other Topics Concern  . Not on file  Social History Narrative   Lives with husband.  Independent of ADLs. Retired but stays active with [employer] part time.   Right-handed   Caffeine: about 1 cup of coffee per day, tea and Cokes a few times per week   Social Determinants of Health   Financial Resource Strain:   . Difficulty of Paying Living Expenses:   Food Insecurity:   . Worried About Charity fundraiser in  the Last Year:   . Arboriculturist in the Last Year:   Transportation Needs:   .  Lack of Transportation (Medical):   Marland Kitchen Lack of Transportation (Non-Medical):   Physical Activity:   . Days of Exercise per Week:   . Minutes of Exercise per Session:   Stress:   . Feeling of Stress :   Social Connections:   . Frequency of Communication with Friends and Family:   . Frequency of Social Gatherings with Friends and Family:   . Attends Religious Services:   . Active Member of Clubs or Organizations:   . Attends Archivist Meetings:   Marland Kitchen Marital Status:   Intimate Partner Violence:   . Fear of Current or Ex-Partner:   . Emotionally Abused:   Marland Kitchen Physically Abused:   . Sexually Abused:       PHYSICAL EXAM  Vitals:   12/13/19 0805  BP: (!) 149/73  Pulse: 71  Weight: 156 lb (70.8 kg)  Height: _0  (1.6 m)   Body mass index is 27.63 kg/m.  Generalized: Well developed, in no acute distress  Chest: Lungs clear to auscultation bilaterally  Neurological examination  Mentation: Alert oriented to time, place, history taking. Follows all commands speech and language fluent Cranial nerve II-XII: Extraocular movements were full, visual field were full on confrontational test Head turning and shoulder shrug  were normal and symmetric. Motor: The motor testing reveals 5 over 5 strength of all 4 extremities. Good symmetric motor tone is noted throughout.  Sensory: Sensory testing is intact to soft touch on all 4 extremities. No evidence of extinction is noted.  Gait and station: Gait is normal.    DIAGNOSTIC DATA (LABS, IMAGING, TESTING) - I reviewed patient records, labs, notes, testing and imaging myself where available.  Lab Results  Component Value Date   WBC 5.6 11/02/2019   HGB 12.3 11/02/2019   HCT 37.8 11/02/2019   MCV 79.4 (L) 11/02/2019   PLT 204 11/02/2019      Component Value Date/Time   NA 142 11/02/2019 1522   K 4.4 11/02/2019 1522   CL 106 11/02/2019 1522     CO2 27 11/02/2019 1522   GLUCOSE 138 (H) 11/02/2019 1522   BUN 11 11/02/2019 1522   CREATININE 0.84 11/02/2019 1522   CALCIUM 9.6 11/02/2019 1522   PROT 6.7 11/02/2019 1522   ALBUMIN 4.6 01/09/2017 1730   AST 27 11/02/2019 1522   ALT 30 (H) 11/02/2019 1522   ALKPHOS 66 01/09/2017 1730   BILITOT 0.6 11/02/2019 1522   GFRNONAA 69 11/02/2019 1522   GFRAA 80 11/02/2019 1522   Lab Results  Component Value Date   CHOL 141 11/02/2019   HDL 42 (L) 11/02/2019   LDLCALC 69 11/02/2019   TRIG 241 (H) 11/02/2019   CHOLHDL 3.4 11/02/2019   Lab Results  Component Value Date   HGBA1C 5.7 (H) 11/02/2019   Lab Results  Component Value Date   VITAMINB12 721 02/16/2016   Lab Results  Component Value Date   TSH 0.85 11/02/2019      ASSESSMENT AND PLAN 74 y.o. year old female  has a past medical history of Anemia, Anxiety, Arthritis, Carpal tunnel syndrome, Cerebrovascular disease (01/02/2017), Duodenal diverticulum, Esophageal motility disorder, Fatty liver, GERD (gastroesophageal reflux disease), Hyperlipidemia, Hypertension, Lymphocytic colitis (09/22/12), Sleep apnea, Tubulovillous adenoma of colon, and Type II or unspecified type diabetes mellitus without mention of complication, not stated as uncontrolled. here with:  1. OSA on CPAP  - CPAP compliance excellent - Good treatment of AHI  - Encourage patient to use CPAP nightly and > 4 hours  each night - F/U in 1 year or sooner if needed   I spent 20 minutes of face-to-face and non-face-to-face time with patient.  This included previsit chart review, lab review, study review, order entry, electronic health record documentation, patient education.  Ward Givens, MSN, NP-C 12/13/2019, 8:30 AM [facility] [address] [place], [place] [address] [phone]

## 2019-12-14 ENCOUNTER — Telehealth: Payer: Self-pay | Admitting: *Deleted

## 2019-12-14 DIAGNOSIS — Z79899 Other long term (current) drug therapy: Secondary | ICD-10-CM

## 2019-12-14 DIAGNOSIS — B351 Tinea unguium: Secondary | ICD-10-CM

## 2019-12-14 NOTE — Telephone Encounter (Signed)
Left message for pt to call for results  

## 2019-12-14 NOTE — Telephone Encounter (Signed)
-----   Message from Bronson Ing, DPM sent at 12/13/2019  9:10 AM EDT ----- Regarding: nail culture result Hi Michele [name]-  Would you mind calling and letting this patient know her nail sample did come back as fungus.  If she would like to treat it, she may try the topical medication from [place] or  oral itraconazole would be an option (she was on lamisil for a year with no improvement).  Her recent blood work liver function looked good so if she wanted to go with itraxonazole, I can go ahead and call her in a 30 day supply, and I would see her back in a month for a check and to order repeat blood work to the rx the last 60 days.  She can also do nothing as the fungus will stay within the nail and not spread (she was concerned the fungus would spread if not treated at her last visit)  Thank you!!!  Dr. Johnette Abraham.  ----- Message ----- From: [place], [place] A Sent: 12/13/2019   8:17 AM EDT To: Bronson Ing, DPM

## 2019-12-15 NOTE — Telephone Encounter (Signed)
Pt called back to receive results

## 2019-12-16 NOTE — Telephone Encounter (Signed)
Pt has called back about results and stated they can be left on her VM please advise

## 2019-12-21 ENCOUNTER — Telehealth: Payer: Self-pay | Admitting: Podiatrist

## 2019-12-21 NOTE — Telephone Encounter (Signed)
Pt lvm about wanting test results

## 2019-12-21 NOTE — Telephone Encounter (Signed)
In Dr. Shaune Pollack 12/13/2019 9:10am message pt's fungal culture is positive for fungus, may use [facility] topical or itraconazole for 30 days and repeat labs in one month for remaining 60 days, or she can do nothing and the fungus would stay in the nails and not spread. Left message for pt to call and discuss.

## 2019-12-23 NOTE — Telephone Encounter (Signed)
Left message for pt to call to discuss Dr. Shaune Pollack entire message because there was a change of therapy.

## 2019-12-23 NOTE — Telephone Encounter (Signed)
Pt called back following up on previous message and states she would like to begin taking the oral Lamisil.  Please send to [facility] on [address] in [place]

## 2019-12-24 MED ORDER — ITRACONAZOLE 100 MG PO CAPS: ORAL_CAPSULE | ORAL | 0 refills | Status: DC

## 2019-12-24 NOTE — Addendum Note (Signed)
Addended by: Harriett Sine D on: 12/24/2019 09:40 AM   Modules accepted: Orders

## 2019-12-24 NOTE — Telephone Encounter (Signed)
I informed pt of Dr. Shaune Pollack recommendations and pt requested to use the itraconazole. Pt would like the repeat labs to be performed with her doctor office. Mailed lab orders to pt to take to her PCP.

## 2020-01-11 ENCOUNTER — Telehealth: Payer: Self-pay | Admitting: Podiatrist

## 2020-01-11 NOTE — Telephone Encounter (Signed)
antonette from [facility] called on behalf of the pt to get medication clarification please call @ [phone]

## 2020-01-11 NOTE — Telephone Encounter (Signed)
I spoke with [facility] - Antonette states pt is wanting to know if she can take ketoconazole or diflucan instead of the itraconazole that is $400.00, she states the PA has already been started by the pharmacy, but may have been sent to Dr. Idell Pickles office. I gave Antonette my direct fax [phone].

## 2020-01-13 NOTE — Telephone Encounter (Signed)
Could she download the goodrx app on her phone?  Generic itrazonazole is $38.60 at [facility] (its the lowest compared to [facility], [facility], [facility], [facility], etc) -  she would need to use the coupon code and it won't go through her insurance.  She would just have to take 2 tabs - every 12 hours (twice a day)- instead of 1 tab bid as the generic only comes in 100mg  capsules.    If she is interested in the [employer], I can call it into whichever one she is closes to-    And if she can't download the app, here is the coupon info:     Good Rx coupon info for itraconazole 100mg  60 tabs.  BIN:  [id number]         PCN:   [place]       Group: [place]       Member ID [id number]  Thank you!!  Dr. Johnette Abraham

## 2020-01-14 ENCOUNTER — Telehealth: Payer: Self-pay | Admitting: *Deleted

## 2020-01-14 NOTE — Telephone Encounter (Signed)
Left message from Dr. Valentina Lucks 01/13/2020 at 8:18pm, with the GoodRx information and the coupon information if she could not download.

## 2020-01-14 NOTE — Telephone Encounter (Signed)
Left message on pt's home phone to call with information.

## 2020-01-14 NOTE — Telephone Encounter (Signed)
Left message informing pt I did not find [facility] on [address], but I had heard her husband say [place] and I temporarily changed the pharmacy to that [facility] and would in form Dr. Cannon Kettle.

## 2020-01-14 NOTE — Telephone Encounter (Signed)
-----   Message from Bronson Ing, DPM sent at 01/14/2020 11:49 AM EDT ----- Regarding: Which [facility]? Hi Inda Mcglothen!  Could you ask which [facility] she would prefer and I will call it in?  Also, I just need her back in a month to recheck labs and make sure med is ok.  Thank you!

## 2020-01-14 NOTE — Telephone Encounter (Signed)
Pt called states she can use the [facility] on [address].

## 2020-01-14 NOTE — Telephone Encounter (Signed)
Unable to leave message on home phone the line was busy. Left message on pt's husband's mobile to call with the [facility] of her choice and to make an appt for 30 days after beginning the medication.

## 2020-01-16 MED ORDER — ITRACONAZOLE 100 MG PO CAPS: 200.0000 mg | ORAL_CAPSULE | Freq: Two times a day (BID) | ORAL | 0 refills | Status: AC

## 2020-01-18 DIAGNOSIS — D1801 Hemangioma of skin and subcutaneous tissue: Secondary | ICD-10-CM | POA: Diagnosis not present

## 2020-01-18 DIAGNOSIS — D225 Melanocytic nevi of trunk: Secondary | ICD-10-CM | POA: Diagnosis not present

## 2020-01-18 DIAGNOSIS — L80 Vitiligo: Secondary | ICD-10-CM | POA: Diagnosis not present

## 2020-01-18 DIAGNOSIS — L821 Other seborrheic keratosis: Secondary | ICD-10-CM | POA: Diagnosis not present

## 2020-01-18 DIAGNOSIS — L603 Nail dystrophy: Secondary | ICD-10-CM | POA: Diagnosis not present

## 2020-02-03 ENCOUNTER — Other Ambulatory Visit: Payer: Self-pay | Admitting: Internal Medicine

## 2020-02-03 DIAGNOSIS — I10 Essential (primary) hypertension: Secondary | ICD-10-CM

## 2020-02-24 ENCOUNTER — Ambulatory Visit: Payer: PPO | Admitting: Sports Medicine

## 2020-02-24 ENCOUNTER — Other Ambulatory Visit: Payer: Self-pay

## 2020-02-24 ENCOUNTER — Encounter: Payer: Self-pay | Admitting: Sports Medicine

## 2020-02-24 DIAGNOSIS — B351 Tinea unguium: Secondary | ICD-10-CM | POA: Diagnosis not present

## 2020-02-24 NOTE — Progress Notes (Signed)
Subjective: Michele [name] is a 74 y.o. female patient seen today in office for fungal culture results. Patient reports that it is taken her very long time to get the results and to discuss treatment options as she is very frustrated by the lack of communication and not being able to get through to our office reports that she even had to drive over to come in person to get a follow-up appointment in order to go over these results.  Patient otherwise has no other pedal complaints at this time.   Patient Active Problem List   Diagnosis Date Noted  . Depression, major, recurrent, in partial remission ([place]) 11/02/2019  . Anxiety 12/10/2018  . Sleep apnea on CPAP 09/18/2018  . Enrolled in chronic care management 09/18/2018  . Vitiligo 11/09/2017  . Encounter for Medicare annual wellness exam 07/24/2017  . Type 2 diabetes mellitus ([place]) 07/24/2017  . Overweight (BMI 25.0-29.9) 07/24/2017  . Cerebrovascular disease 01/02/2017  . History of hyperthyroidism 09/11/2015  . Gout 09/22/2014  . Vitamin D deficiency 06/28/2013  . Medication management 06/28/2013  . Anemia 06/28/2013  . CKD stage 2 due to type 2 diabetes mellitus ([place])   . Hyperlipidemia associated with type 2 diabetes mellitus ([place]) 03/21/2008  . Essential hypertension 03/21/2008  . GERD 03/21/2008  . Fatty liver 03/21/2008  . COLONIC POLYPS, ADENOMATOUS, HX OF 03/21/2008    Current Outpatient Medications on File Prior to Visit  Medication Sig Dispense Refill  . APPLE CIDER VINEGAR PO Take by mouth. Takes daily.    Marland Kitchen aspirin 325 MG EC tablet Take 325 mg by mouth daily.    Marland Kitchen BIOTIN PO Take 1 tablet by mouth daily.    . blood glucose meter kit and supplies Test sugar once a day. 1 each 0  . Blood Glucose Monitoring Suppl DEVI Test sugars once daily.DX diabetes 1 each 0  . Cholecalciferol (VITAMIN D PO) Take 10,000 Units by mouth daily.     . Cinnamon 500 MG capsule Take 500 mg by mouth daily.    . cloNIDine (CATAPRES) 0.2 MG  tablet Take 1 tablet (0.2 mg total) by mouth as needed. Takes if systolic BP is over 017. 90 tablet 1  . Cyanocobalamin (VITAMIN B-12 PO) Take 1 tablet by mouth daily.    Marland Kitchen glucose blood test strip Test sugar once a day DX diabetes 100 each 12  . hydrochlorothiazide (HYDRODIURIL) 12.5 MG tablet Take 1 tablet (12.5 mg total) by mouth daily. 30 tablet 3  . itraconazole (SPORANOX) 100 MG capsule Take 2 capsules daily 60 capsule 0  . Lancets MISC Test blood sugar once daily 100 each 11  . Magnesium 500 MG TABS Take 1 tablet by mouth 3 (three) times daily.    . metFORMIN (GLUCOPHAGE) 500 MG tablet Take 1 tablet 2 x /day with Breakfast & Lunch &   2 tablets with Supper for Diabetes 360 tablet 0  . minoxidil (LONITEN) 10 MG tablet Take 1/2 to 1 tablet Daily for BP 90 tablet 3  . olmesartan (BENICAR) 40 MG tablet Take 1 tablet    Daily   for BP 90 tablet 0  . Omega-3 Fatty Acids (FISH OIL PO) Take 1 capsule by mouth daily.    . rosuvastatin (CRESTOR) 20 MG tablet Take 1 tablet Daily for Cholesterol 90 tablet 0  . senna (SENOKOT) 8.6 MG tablet Take 1 tablet by mouth daily.    . sertraline (ZOLOFT) 50 MG tablet Take 1 tablet Daily for Mood 90  tablet 3  . verapamil (CALAN-SR) 240 MG CR tablet Take 1 tablet Daily with food for BP 90 tablet 3   No current facility-administered medications on file prior to visit.    Allergies  Allergen Reactions  . Lipitor [Atorvastatin]     Elevates LFT's    Objective: Physical Exam  General: Well developed, nourished, no acute distress, awake, alert and oriented x 3  Vascular: Dorsalis pedis artery 2/4 bilateral, Posterior tibial artery 1/4 bilateral, skin temperature warm to warm proximal to distal bilateral lower extremities, no varicosities, pedal hair present bilateral.  Neurological: Gross sensation present via light touch bilateral.   Dermatological: Skin is warm, dry, and supple bilateral, right hallux nail, short thick, and discolored with mild subungal  debris, no webspace macerations present bilateral, no open lesions present bilateral, no callus/corns/hyperkeratotic tissue present bilateral.  Hypopigmentation noted to several areas consistent with patient's skin condition/vitiligo.  No signs of infection bilateral.  Musculoskeletal: No symptomatic boney deformities noted bilateral.   Fungal culture consistent with fungus saprophyte  Assessment and Plan:  Problem List Items Addressed This Visit    None    Visit Diagnoses    Nail fungus    -  Primary      -Examined patient -Discussed treatment options for painful mycotic nails -Patient wants to discuss treatment options with her PCP and reports that she will also possibly get her PCP to do her liver panel if she decides she wants to do oral treatment -I encourage patient to call office back to inform me of the decision she has made after she has seen her PCP on Monday -Advised good hygiene habits -Patient to return as needed or after she has started medication or sooner if symptoms worsen.  Landis Martins, DPM

## 2020-02-27 ENCOUNTER — Encounter: Payer: Self-pay | Admitting: Internal Medicine

## 2020-02-27 NOTE — Patient Instructions (Signed)
Due to recent changes in healthcare laws, you may see the results of your imaging and laboratory studies on MyChart before your provider has had a chance to review them.  We understand that in some cases there may be results that are confusing or concerning to you. Not all laboratory results come back in the same time frame and the provider may be waiting for multiple results in order to interpret others.  Please give us 48 hours in order for your provider to thoroughly review all the results before contacting the office for clarification of your results.   ++++++++++++++++++++++++++++++ S T O P    B I O T I  N  ++++++++++++++++++++++++++++++  Vit D  & Vit C 1,000 mg   are recommended to help protect  against the Covid-19 and other Corona viruses.    Also it's recommended  to take  Zinc 50 mg  to help  protect against the Covid-19   and best place to get  is also on [employer]  and don't pay more than 6-8 cents /pill !  ================================ Coronavirus (COVID-19) Are you at risk?  Are you at risk for the Coronavirus (COVID-19)?  To be considered HIGH RISK for Coronavirus (COVID-19), you have to meet the following criteria:  . Traveled to [place], [place], [place], [place] or [place]; or in the [place] to [place], [place], [place]  . or [place]; and have fever, cough, and shortness of breath within the last 2 weeks of travel OR . Been in close contact with a person diagnosed with COVID-19 within the last 2 weeks and have  . fever, cough,and shortness of breath .  . IF YOU DO NOT MEET THESE CRITERIA, YOU ARE CONSIDERED LOW RISK FOR COVID-19.  What to do if you are HIGH RISK for COVID-19?  . If you are having a medical emergency, call 911. . Seek medical care right away. Before you go to a doctor's office, urgent care or emergency department, .  call ahead and tell them about your recent travel, contact with someone diagnosed with COVID-19  .  and your  symptoms.  . You should receive instructions from your physician's office regarding next steps of care.  . When you arrive at healthcare provider, tell the healthcare staff immediately you have returned from  . visiting [place], [place], [place], [place] or [place]; or traveled in the [place] to [place], [place],  . [place] or [place] in the last two weeks or you have been in close contact with a person diagnosed with  . COVID-19 in the last 2 weeks.   . Tell the health care staff about your symptoms: fever, cough and shortness of breath. . After you have been seen by a medical provider, you will be either: o Tested for (COVID-19) and discharged home on quarantine except to seek medical care if  o symptoms worsen, and asked to  - Stay home and avoid contact with others until you get your results (4-5 days)  - Avoid travel on public transportation if possible (such as bus, train, or airplane) or o Sent to the Emergency Department by EMS for evaluation, COVID-19 testing  and  o possible admission depending on your condition and test results.  What to do if you are LOW RISK for COVID-19?  Reduce your risk of any infection by using the same precautions used for avoiding the common cold or flu:  . Wash your hands often with soap   and warm water for at least 20 seconds.  If soap and water are not readily available,  . use an alcohol-based hand sanitizer with at least 60% alcohol.  . If coughing or sneezing, cover your mouth and nose by coughing or sneezing into the elbow areas of your shirt or coat, .  into a tissue or into your sleeve (not your hands). . Avoid shaking hands with others and consider head nods or verbal greetings only. . Avoid touching your eyes, nose, or mouth with unwashed hands.  . Avoid close contact with people who are sick. . Avoid places or events with large numbers of people in one location, like concerts or sporting events. . Carefully consider travel plans you  have or are making. . If you are planning any travel outside or inside the [place], visit the CDC's Travelers' Health webpage for the latest health notices. . If you have some symptoms but not all symptoms, continue to monitor at home and seek medical attention  . if your symptoms worsen. . If you are having a medical emergency, call 911.   . >>>>>>>>>>>>>>>>>>>>>>>>>>>>>>>>> . We Do NOT Approve of  [facility] [place] Soliciting Our Patients  To Do Home Visits & We Do NOT Approve of [employer] > > > > > > > > > > > > > > > > > > > > > > > > > > > > > > > > > > > > > > >  Preventive Care for Adults  A healthy lifestyle and preventive care can promote health and wellness. Preventive health guidelines for women include the following key practices.  A routine yearly physical is a good way to check with your health care provider about your health and preventive screening. It is a chance to share any concerns and updates on your health and to receive a thorough exam.  Visit your dentist for a routine exam and preventive care every 6 months. Brush your teeth twice a day and floss once a day. Good oral hygiene prevents tooth decay and gum disease.  The frequency of eye exams is based on your age, health, family medical history, use of contact lenses, and other factors. Follow your health care provider's recommendations for frequency of eye exams.  Eat a healthy diet. Foods like vegetables, fruits, whole grains, low-fat dairy products, and lean protein foods contain the nutrients you need without too many calories. Decrease your intake of foods high in solid fats, added sugars, and salt. Eat the right amount of calories for you. Get information about a proper diet from your health care provider, if necessary.  Regular physical exercise is one of the most important things you can do for your health. Most adults should get at least 150 minutes of moderate-intensity exercise (any  activity that increases your heart rate and causes you to sweat) each week. In addition, most adults need muscle-strengthening exercises on 2 or more days a week.  Maintain a healthy weight. The body mass index (BMI) is a screening tool to identify possible weight problems. It provides an estimate of body fat based on height and weight. Your health care provider can find your BMI and can help you achieve or maintain a healthy weight. For adults 20 years and older:  A BMI below 18.5 is considered underweight.  A BMI of 18.5 to 24.9 is normal.  A BMI of 25 to 29.9 is considered overweight.  A BMI of 30 and above is considered   obese.  Maintain normal blood lipids and cholesterol levels by exercising and minimizing your intake of saturated fat. Eat a balanced diet with plenty of fruit and vegetables. If your lipid or cholesterol levels are high, you are over 50, or you are at high risk for heart disease, you may need your cholesterol levels checked more frequently. Ongoing high lipid and cholesterol levels should be treated with medicines if diet and exercise are not working.  If you smoke, find out from your health care provider how to quit. If you do not use tobacco, do not start.  Lung cancer screening is recommended for adults aged 55-80 years who are at high risk for developing lung cancer because of a history of smoking. A yearly low-dose CT scan of the lungs is recommended for people who have at least a 30-pack-year history of smoking and are a current smoker or have quit within the past 15 years. A pack year of smoking is smoking an average of 1 pack of cigarettes a day for 1 year (for example: 1 pack a day for 30 years or 2 packs a day for 15 years). Yearly screening should continue until the smoker has stopped smoking for at least 15 years. Yearly screening should be stopped for people who develop a health problem that would prevent them from having lung cancer treatment.  Avoid use of street  drugs. Do not share needles with anyone. Ask for help if you need support or instructions about stopping the use of drugs.  High blood pressure causes heart disease and increases the risk of stroke.  Ongoing high blood pressure should be treated with medicines if weight loss and exercise do not work.  If you are 55-79 years old, ask your health care provider if you should take aspirin to prevent strokes.  Diabetes screening involves taking a blood sample to check your fasting blood sugar level. This should be done once every 3 years, after age 45, if you are within normal weight and without risk factors for diabetes. Testing should be considered at a younger age or be carried out more frequently if you are overweight and have at least 1 risk factor for diabetes.  Breast cancer screening is essential preventive care for women. You should practice "breast self-awareness." This means understanding the normal appearance and feel of your breasts and may include breast self-examination. Any changes detected, no matter how small, should be reported to a health care provider. Women in their 20s and 30s should have a clinical breast exam (CBE) by a health care provider as part of a regular health exam every 1 to 3 years. After age 40, women should have a CBE every year. Starting at age 40, women should consider having a mammogram (breast X-ray test) every year. Women who have a family history of breast cancer should talk to their health care provider about genetic screening. Women at a high risk of breast cancer should talk to their health care providers about having an MRI and a mammogram every year.  Breast cancer gene (BRCA)-related cancer risk assessment is recommended for women who have family members with BRCA-related cancers. BRCA-related cancers include breast, ovarian, tubal, and peritoneal cancers. Having family members with these cancers may be associated with an increased risk for harmful changes  (mutations) in the breast cancer genes BRCA1 and BRCA2. Results of the assessment will determine the need for genetic counseling and BRCA1 and BRCA2 testing.  Routine pelvic exams to screen for cancer are no longer   recommended for nonpregnant women who are considered low risk for cancer of the pelvic organs (ovaries, uterus, and vagina) and who do not have symptoms. Ask your health care provider if a screening pelvic exam is right for you.  If you have had past treatment for cervical cancer or a condition that could lead to cancer, you need Pap tests and screening for cancer for at least 20 years after your treatment. If Pap tests have been discontinued, your risk factors (such as having a new sexual partner) need to be reassessed to determine if screening should be resumed. Some women have medical problems that increase the chance of getting cervical cancer. In these cases, your health care provider may recommend more frequent screening and Pap tests.    Colorectal cancer can be detected and often prevented. Most routine colorectal cancer screening begins at the age of 50 years and continues through age 75 years. However, your health care provider may recommend screening at an earlier age if you have risk factors for colon cancer. On a yearly basis, your health care provider may provide home test kits to check for hidden blood in the stool. Use of a small camera at the end of a tube, to directly examine the colon (sigmoidoscopy or colonoscopy), can detect the earliest forms of colorectal cancer. Talk to your health care provider about this at age 50, when routine screening begins.  Direct exam of the colon should be repeated every 5-10 years through age 75 years, unless early forms of pre-cancerous polyps or small growths are found.  Osteoporosis is a disease in which the bones lose minerals and strength with aging. This can result in serious bone fractures or breaks. The risk of osteoporosis can be  identified using a bone density scan. Women ages 65 years and over and women at risk for fractures or osteoporosis should discuss screening with their health care providers. Ask your health care provider whether you should take a calcium supplement or vitamin D to reduce the rate of osteoporosis.  Menopause can be associated with physical symptoms and risks. Hormone replacement therapy is available to decrease symptoms and risks. You should talk to your health care provider about whether hormone replacement therapy is right for you.  Use sunscreen. Apply sunscreen liberally and repeatedly throughout the day. You should seek shade when your shadow is shorter than you. Protect yourself by wearing long sleeves, pants, a wide-brimmed hat, and sunglasses year round, whenever you are outdoors.  Once a month, do a whole body skin exam, using a mirror to look at the skin on your back. Tell your health care provider of new moles, moles that have irregular borders, moles that are larger than a pencil eraser, or moles that have changed in shape or color.  Stay current with required vaccines (immunizations).  Influenza vaccine. All adults should be immunized every year.  Tetanus, diphtheria, and acellular pertussis (Td, Tdap) vaccine. Pregnant women should receive 1 dose of Tdap vaccine during each pregnancy. The dose should be obtained regardless of the length of time since the last dose. Immunization is preferred during the 27th-36th week of gestation. An adult who has not previously received Tdap or who does not know her vaccine status should receive 1 dose of Tdap. This initial dose should be followed by tetanus and diphtheria toxoids (Td) booster doses every 10 years. Adults with an unknown or incomplete history of completing a 3-dose immunization series with Td-containing vaccines should begin or complete a primary immunization series   including a Tdap dose. Adults should receive a Td booster every 10  years.    Zoster vaccine. One dose is recommended for adults aged 60 years or older unless certain conditions are present.    Pneumococcal 13-valent conjugate (PCV13) vaccine. When indicated, a person who is uncertain of her immunization history and has no record of immunization should receive the PCV13 vaccine. An adult aged 19 years or older who has certain medical conditions and has not been previously immunized should receive 1 dose of PCV13 vaccine. This PCV13 should be followed with a dose of pneumococcal polysaccharide (PPSV23) vaccine. The PPSV23 vaccine dose should be obtained at least 1 or more year(s) after the dose of PCV13 vaccine. An adult aged 19 years or older who has certain medical conditions and previously received 1 or more doses of PPSV23 vaccine should receive 1 dose of PCV13. The PCV13 vaccine dose should be obtained 1 or more years after the last PPSV23 vaccine dose.    Pneumococcal polysaccharide (PPSV23) vaccine. When PCV13 is also indicated, PCV13 should be obtained first. All adults aged 65 years and older should be immunized. An adult younger than age 65 years who has certain medical conditions should be immunized. Any person who resides in a nursing home or long-term care facility should be immunized. An adult smoker should be immunized. People with an immunocompromised condition and certain other conditions should receive both PCV13 and PPSV23 vaccines. People with human immunodeficiency virus (HIV) infection should be immunized as soon as possible after diagnosis. Immunization during chemotherapy or radiation therapy should be avoided. Routine use of PPSV23 vaccine is not recommended for American Indians, [place] Natives, or people younger than 65 years unless there are medical conditions that require PPSV23 vaccine. When indicated, people who have unknown immunization and have no record of immunization should receive PPSV23 vaccine. One-time revaccination 5 years after the  first dose of PPSV23 is recommended for people aged 19-64 years who have chronic kidney failure, nephrotic syndrome, asplenia, or immunocompromised conditions. People who received 1-2 doses of PPSV23 before age 65 years should receive another dose of PPSV23 vaccine at age 65 years or later if at least 5 years have passed since the previous dose. Doses of PPSV23 are not needed for people immunized with PPSV23 at or after age 65 years.   Preventive Services / Frequency  Ages 65 years and over  Blood pressure check.  Lipid and cholesterol check.  Lung cancer screening. / Every year if you are aged 55-80 years and have a 30-pack-year history of smoking and currently smoke or have quit within the past 15 years. Yearly screening is stopped once you have quit smoking for at least 15 years or develop a health problem that would prevent you from having lung cancer treatment.  Clinical breast exam.** / Every year after age 40 years.   BRCA-related cancer risk assessment.** / For women who have family members with a BRCA-related cancer (breast, ovarian, tubal, or peritoneal cancers).  Mammogram.** / Every year beginning at age 40 years and continuing for as long as you are in good health. Consult with your health care provider.  Pap test.** / Every 3 years starting at age 30 years through age 65 or 70 years with 3 consecutive normal Pap tests. Testing can be stopped between 65 and 70 years with 3 consecutive normal Pap tests and no abnormal Pap or HPV tests in the past 10 years.  Fecal occult blood test (FOBT) of stool. / Every year   beginning at age 50 years and continuing until age 75 years. You may not need to do this test if you get a colonoscopy every 10 years.  Flexible sigmoidoscopy or colonoscopy.** / Every 5 years for a flexible sigmoidoscopy or every 10 years for a colonoscopy beginning at age 50 years and continuing until age 75 years.  Hepatitis C blood test.** / For all people born from  1945 through 1965 and any individual with known risks for hepatitis C.  Osteoporosis screening.** / A one-time screening for women ages 65 years and over and women at risk for fractures or osteoporosis.  Skin self-exam. / Monthly.  Influenza vaccine. / Every year.  Tetanus, diphtheria, and acellular pertussis (Tdap/Td) vaccine.** / 1 dose of Td every 10 years.  Zoster vaccine.** / 1 dose for adults aged 60 years or older.  Pneumococcal 13-valent conjugate (PCV13) vaccine.** / Consult your health care provider.  Pneumococcal polysaccharide (PPSV23) vaccine.** / 1 dose for all adults aged 65 years and older. Screening for abdominal aortic aneurysm (AAA)  by ultrasound is recommended for people who have history of high blood pressure or who are current or former smokers. ++++++++++++++++++++ Recommend Adult Low Dose Aspirin or  coated  Aspirin 81 mg daily  To reduce risk of Colon Cancer 40 %,  Skin Cancer 26 % ,  Melanoma 46%  and  Pancreatic cancer 60% ++++++++++++++++++++ Vitamin D goal  is between 70-100.  Please make sure that you are taking your Vitamin D as directed.  It is very important as a natural anti-inflammatory  helping hair, skin, and nails, as well as reducing stroke and heart attack risk.  It helps your bones and helps with mood. It also decreases numerous cancer risks so please take it as directed.  Low Vit D is associated with a 200-300% higher risk for CANCER  and 200-300% higher risk for HEART   ATTACK  &  STROKE.   ...................................... It is also associated with higher death rate at younger ages,  autoimmune diseases like Rheumatoid arthritis, Lupus, Multiple Sclerosis.    Also many other serious conditions, like depression, Alzheimer's Dementia, infertility, muscle aches, fatigue, fibromyalgia - just to name a few. ++++++++++++++++++ Recommend the book "The END of DIETING" by Dr Joel Fuhrman  & the book "The END of DIABETES " by Dr Joel  Fuhrman At [employer] - get book & Audio CD's    Being diabetic has a  300% increased risk for heart attack, stroke, cancer, and alzheimer- type vascular dementia. It is very important that you work harder with diet by avoiding all foods that are white. Avoid white rice (brown & wild rice is OK), white potatoes (sweetpotatoes in moderation is OK), White bread or wheat bread or anything made out of white flour like bagels, donuts, rolls, buns, biscuits, cakes, pastries, cookies, pizza crust, and pasta (made from white flour & egg whites) - vegetarian pasta or spinach or wheat pasta is OK. Multigrain breads like Arnold's or Pepperidge [employer], or multigrain sandwich thins or flatbreads.  Diet, exercise and weight loss can reverse and cure diabetes in the early stages.  Diet, exercise and weight loss is very important in the control and prevention of complications of diabetes which affects every system in your body, ie. Brain - dementia/stroke, eyes - glaucoma/blindness, heart - heart attack/heart failure, kidneys - dialysis, stomach - gastric paralysis, intestines - malabsorption, nerves - severe painful neuritis, circulation - gangrene & loss of a leg(s), and finally cancer and Alzheimers.      I recommend avoid fried & greasy foods,  sweets/candy, white rice (brown or wild rice or Quinoa is OK), white potatoes (sweet potatoes are OK) - anything made from white flour - bagels, doughnuts, rolls, buns, biscuits,white and wheat breads, pizza crust and traditional pasta made of white flour & egg white(vegetarian pasta or spinach or wheat pasta is OK).  Multi-grain bread is OK - like multi-grain flat bread or sandwich thins. Avoid alcohol in excess. Exercise is also important.    Eat all the vegetables you want - avoid meat, especially red meat and dairy - especially cheese.  Cheese is the most concentrated form of trans-fats which is the worst thing to clog up our arteries. Veggie cheese is OK which can be found in the  fresh produce section at [employer] or [employer] or [employer]  +++++++++++++++++++ DASH Eating Plan  DASH stands for "Dietary Approaches to Stop Hypertension."   The DASH eating plan is a healthy eating plan that has been shown to reduce high blood pressure (hypertension). Additional health benefits may include reducing the risk of type 2 diabetes mellitus, heart disease, and stroke. The DASH eating plan may also help with weight loss. WHAT DO I NEED TO KNOW ABOUT THE DASH EATING PLAN? For the DASH eating plan, you will follow these general guidelines:  Choose foods with a percent daily value for sodium of less than 5% (as listed on the food label).  Use salt-free seasonings or herbs instead of table salt or sea salt.  Check with your health care provider or pharmacist before using salt substitutes.  Eat lower-sodium products, often labeled as "lower sodium" or "no salt added."  Eat fresh foods.  Eat more vegetables, fruits, and low-fat dairy products.  Choose whole grains. Look for the word "whole" as the first word in the ingredient list.  Choose fish   Limit sweets, desserts, sugars, and sugary drinks.  Choose heart-healthy fats.  Eat veggie cheese   Eat more home-cooked food and less restaurant, buffet, and fast food.  Limit fried foods.  Cook foods using methods other than frying.  Limit canned vegetables. If you do use them, rinse them well to decrease the sodium.  When eating at a restaurant, ask that your food be prepared with less salt, or no salt if possible.                      WHAT FOODS CAN I EAT? Read Dr Joel Fuhrman's books on The End of Dieting & The End of Diabetes  Grains Whole grain or whole wheat bread. Brown rice. Whole grain or whole wheat pasta. Quinoa, bulgur, and whole grain cereals. Low-sodium cereals. Corn or whole wheat flour tortillas. Whole grain cornbread. Whole grain crackers. Low-sodium crackers.  Vegetables Fresh or frozen  vegetables (raw, steamed, roasted, or grilled). Low-sodium or reduced-sodium tomato and vegetable juices. Low-sodium or reduced-sodium tomato sauce and paste. Low-sodium or reduced-sodium canned vegetables.   Fruits All fresh, canned (in natural juice), or frozen fruits.  Protein Products  All fish and seafood.  Dried beans, peas, or lentils. Unsalted nuts and seeds. Unsalted canned beans.  Dairy Low-fat dairy products, such as skim or 1% milk, 2% or reduced-fat cheeses, low-fat ricotta or cottage cheese, or plain low-fat yogurt. Low-sodium or reduced-sodium cheeses.  Fats and Oils Tub margarines without trans fats. Light or reduced-fat mayonnaise and salad dressings (reduced sodium). Avocado. Safflower, olive, or canola oils. Natural peanut or almond butter.  Other Unsalted popcorn and   pretzels. The items listed above may not be a complete list of recommended foods or beverages. Contact your dietitian for more options.  +++++++++++++++  WHAT FOODS ARE NOT RECOMMENDED? Grains/ White flour or wheat flour White bread. White pasta. White rice. Refined cornbread. Bagels and croissants. Crackers that contain trans fat.  Vegetables  Creamed or fried vegetables. Vegetables in a . Regular canned vegetables. Regular canned tomato sauce and paste. Regular tomato and vegetable juices.  Fruits Dried fruits. Canned fruit in light or heavy syrup. Fruit juice.  Meat and Other Protein Products Meat in general - RED meat & White meat.  Fatty cuts of meat. Ribs, chicken wings, all processed meats as bacon, sausage, bologna, salami, fatback, hot dogs, bratwurst and packaged luncheon meats.  Dairy Whole or 2% milk, cream, half-and-half, and cream cheese. Whole-fat or sweetened yogurt. Full-fat cheeses or blue cheese. Non-dairy creamers and whipped toppings. Processed cheese, cheese spreads, or cheese curds.  Condiments Onion and garlic salt, seasoned salt, table salt, and sea salt. Canned and  packaged gravies. Worcestershire sauce. Tartar sauce. Barbecue sauce. Teriyaki sauce. Soy sauce, including reduced sodium. Steak sauce. Fish sauce. Oyster sauce. Cocktail sauce. Horseradish. Ketchup and mustard. Meat flavorings and tenderizers. Bouillon cubes. Hot sauce. Tabasco sauce. Marinades. Taco seasonings. Relishes.  Fats and Oils Butter, stick margarine, lard, shortening and bacon fat. Coconut, palm kernel, or palm oils. Regular salad dressings.  Pickles and olives. Salted popcorn and pretzels.  The items listed above may not be a complete list of foods and beverages to avoid.    

## 2020-02-27 NOTE — Progress Notes (Signed)
Annual Screening/Preventative Visit & Comprehensive Evaluation &  Examination     This very nice 74 y.o.  [name] presents for a Screening /Preventative Visit & comprehensive evaluation and management of multiple medical co-morbidities.  Patient has been followed for HTN, HLD, T2_NIDDM  and Vitamin D Deficiency.  Patient has hx/o OSA, but was Mask Intolerant.  Today, patient is also reporting sx's consistent with OAB.       HTN predates circa 1978. Patient's BP has been controlled at home and patient denies any cardiac symptoms as chest pain, palpitations, shortness of breath, dizziness or ankle swelling. She had a negative Ren Aa Duplex in 2014 and a Negative Myoview in 2017. Today's BP is elevated at 160/80 & rechecked at 176/82.      Patient's hyperlipidemia is controlled with diet and medications. Patient denies myalgias or other medication SE's. Last lipids were at goal; except elevated Trig's:  Lab Results  Component Value Date   CHOL 141 11/02/2019   HDL 42 (L) 11/02/2019   LDLCALC 69 11/02/2019   TRIG 241 (H) 11/02/2019   CHOLHDL 3.4 11/02/2019       Patient has hx/o T2_NIDDM  (2006) /CKD2 (GFR 69) predating since      and patient denies reactive hypoglycemic symptoms, visual blurring, diabetic polys or paresthesias. Last A1c was  At goal:  Lab Results  Component Value Date   HGBA1C 5.7 (H) 11/02/2019       Finally, patient has history of Vitamin D Deficiency  ("19" /2008) and last Vitamin D was near goal (70-100):  Lab Results  Component Value Date   VD25OH 55 07/28/2019    Current Outpatient Medications on File Prior to Visit  Medication Sig  . APPLE CIDER VINEGAR Take by mouth. Takes daily.  Marland Kitchen aspirin 325 MG EC  Take 325 mg by mouth daily.  Marland Kitchen BIOTIN  Take 1 tablet by mouth daily.  Marland Kitchen VITAMIN D Take 10,000 Units by mouth daily.   . Cinnamon 500 MG capsule Take 500 mg by mouth daily.  . cloNIDine 0.2 MG tablet Take 1 tablet (0.2 mg total) by mouth as needed.   Marland Kitchen  VITAMIN B-12 tab Take 1 tablet by mouth daily.  . SPORANOX 100 MG capsule Take 2 capsules daily  . Magnesium 500 MG TABS Take 1 tablet by mouth 3 (three) times daily.  . metFORMIN 500 MG tablet Take 1 tablet 2 x /day with Breakfast & Lunch &   2 tablets with Supper for Diabetes  . minoxidil 10 MG tablet Take 1/2 to 1 tablet Daily for BP  . olmesartan 40 MG tablet Take 1 tablet    Daily   for BP  . Omega-3  FISH OIL Take 1 capsule by mouth daily.  . rosuvastatin  20 MG tablet Take 1 tablet Daily for Cholesterol  . senna 8.6 MG tablet Take 1 tablet by mouth daily.  . sertraline  50 MG tablet Take 1 tablet Daily for Mood    Allergies  Allergen Reactions  . Lipitor [Atorvastatin]     Elevates LFT's   Past Medical History:  Diagnosis Date  . Anemia   . Anxiety   . Arthritis   . Carpal tunnel syndrome   . Cerebrovascular disease 01/02/2017  . Duodenal diverticulum   . Esophageal motility disorder   . Fatty liver   . GERD (gastroesophageal reflux disease)   . Hyperlipidemia   . Hypertension   . 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 Sleep apnea   .  Tubulovillous adenoma of colon   . Type II or unspecified type diabetes mellitus without mention of complication, not stated as uncontrolled    Health Maintenance  Topic Date Due  . INFLUENZA VACCINE  01/02/2020  . HEMOGLOBIN A1C  05/03/2020  . FOOT EXAM  11/01/2020  . OPHTHALMOLOGY EXAM  11/16/2020  . MAMMOGRAM  08/31/2021  . COLONOSCOPY  02/11/2023  . TETANUS/TDAP  03/26/2023  . DEXA SCAN  Completed  . COVID-19 Vaccine  Completed  . Hepatitis C Screening  Completed  . PNA vac Low Risk Adult  Completed   Immunization History  Administered Date(s) Administered  . Influenza Split 03/25/2013  . Influenza, High Dose Seasonal PF 03/15/2014, 03/07/2015, 03/27/2016, 04/21/2017, 02/18/2018, 03/11/2019  . PFIZER SARS-COV-2 Vaccination 06/24/2019, 07/15/2019  . Pneumococcal Conjugate-13 01/26/2014  . Pneumococcal Polysaccharide-23 03/14/2011   . Td 03/25/2013  . Zoster 06/03/2005    Last Colon - 09/22/2012 - Dr Delfin Edis - Dx Lymphocytic colitis                   - 02/10/2018 - Colonoscopy - Dr Henrene Pastor - Recc 5 yr f/u due Sept 2024  Last MGM - 09/01/2019  Past Surgical History:  Procedure Laterality Date  . APPENDECTOMY    . BREAST BIOPSY Right   . CARPAL TUNNEL RELEASE Right 1992  . cataract extration    . COLONOSCOPY    . CYST REMOVAL HAND    . TONSILLECTOMY  1952   Family History  Problem Relation Age of Onset  . Heart disease Mother   . Diabetes Mother   . Colon polyps Mother   . Heart disease Father   . Diabetes Father   . Heart attack Father   . Heart disease Brother   . Heart attack Brother   . Cerebral aneurysm Sister   . Heart disease Brother        Stent  . Colon cancer Neg Hx   . Esophageal cancer Neg Hx   . Stomach cancer Neg Hx   . Rectal cancer Neg Hx   . Breast cancer Neg Hx    Social History   Tobacco Use  . Smoking status: Never Smoker  . Smokeless tobacco: Never Used  Vaping Use  . Vaping Use: Never used  Substance Use Topics  . Alcohol use: No  . Drug use: No    ROS Constitutional: Denies fever, chills, weight loss/gain, headaches, insomnia,  night sweats, and change in appetite. Does c/o fatigue. Eyes: Denies redness, blurred vision, diplopia, discharge, itchy, watery eyes.  ENT: Denies discharge, congestion, post nasal drip, epistaxis, sore throat, earache, hearing loss, dental pain, Tinnitus, Vertigo, Sinus pain, snoring.  Cardio: Denies chest pain, palpitations, irregular heartbeat, syncope, dyspnea, diaphoresis, orthopnea, PND, claudication, edema Respiratory: denies cough, dyspnea, DOE, pleurisy, hoarseness, laryngitis, wheezing.  Gastrointestinal: Denies dysphagia, heartburn, reflux, water brash, pain, cramps, nausea, vomiting, bloating, diarrhea, constipation, hematemesis, melena, hematochezia, jaundice, hemorrhoids Genitourinary: Denies dysuria, frequency, urgency,  nocturia, hesitancy, discharge, hematuria, flank pain Breast: Breast lumps, nipple discharge, bleeding.  Musculoskeletal: Denies arthralgia, myalgia, stiffness, Jt. Swelling, pain, limp, and strain/sprain. Denies falls. Skin: Denies puritis, rash, hives, warts, acne, eczema, changing in skin lesion Neuro: No weakness, tremor, incoordination, spasms, paresthesia, pain Psychiatric: Denies confusion, memory loss, sensory loss. Denies Depression. Endocrine: Denies change in weight, skin, hair change, nocturia, and paresthesia, diabetic polys, visual blurring, hyper / hypo glycemic episodes.  Heme/Lymph: No excessive bleeding, bruising, enlarged lymph nodes.  Physical Exam  BP (!) 160/80  Pulse 72   Temp (!) 97.3 F (36.3 C)   Resp 16   Ht 5\' 3"  (1.6 m)   Wt 158 lb 3.2 oz (71.8 kg)   BMI 28.02 kg/m   General Appearance: Well nourished, well groomed and in no apparent distress.  Eyes: PERRLA, EOMs, conjunctiva no swelling or erythema, normal fundi and vessels. Sinuses: No frontal/maxillary tenderness ENT/Mouth: EACs patent / TMs  nl. Nares clear without erythema, swelling, mucoid exudates. Oral hygiene is good. No erythema, swelling, or exudate. Tongue normal, non-obstructing. Tonsils not swollen or erythematous. Hearing normal.  Neck: Supple, thyroid not palpable. No bruits, nodes or JVD. Respiratory: Respiratory effort normal.  BS equal and clear bilateral without rales, rhonci, wheezing or stridor. Cardio: Heart sounds are normal with regular rate and rhythm and no murmurs, rubs or gallops. Peripheral pulses are normal and equal bilaterally without edema. No aortic or femoral bruits. Chest: symmetric with normal excursions and percussion. Breasts: Symmetric, without lumps, nipple discharge, retractions, or fibrocystic changes.  Abdomen: Flat, soft with bowel sounds active. Nontender, no guarding, rebound, hernias, masses, or organomegaly.  Lymphatics: Non tender without lymphadenopathy.   Genitourinary:  Musculoskeletal: Full ROM all peripheral extremities, joint stability, 5/5 strength, and normal gait. Skin: Warm and dry without rashes, lesions, cyanosis, clubbing or  ecchymosis.  Neuro: Cranial nerves intact, reflexes equal bilaterally. Normal muscle tone, no cerebellar symptoms. Sensation intact.  Pysch: Alert and oriented X 3, normal affect, Insight and Judgment appropriate.   Assessment and Plan  1. Annual Preventative Screening Examination   2. Essential hypertension  - EKG 12-Lead - Urinalysis, Routine w reflex microscopic - Microalbumin / creatinine urine ratio - CBC with Differential/Platelet - COMPLETE METABOLIC PANEL WITH GFR - Magnesium - TSH  3. Hyperlipidemia associated with type 2 diabetes mellitus ([place])  - EKG 12-Lead - Lipid panel - TSH  4. Type 2 diabetes mellitus with stage 2 chronic kidney disease,  without long-term current use of insulin (HCC)  - EKG 12-Lead - Urinalysis, Routine w reflex microscopic - Microalbumin / creatinine urine ratio - HM DIABETES FOOT EXAM - LOW EXTREMITY NEUR EXAM DOCUM - Hemoglobin A1c - Insulin, random  5. Vitamin D deficiency  - VITAMIN D 25 Hydroxy   6. OSA (obstructive sleep apnea)   7. Screening for colorectal cancer  - POC Hemoccult Bld/Stl   8. Screening for ischemic heart disease  - EKG 12-Lead  9. FHx: heart disease  - EKG 12-Lead  10. Medication management  - Urinalysis, Routine w reflex microscopic - Microalbumin / creatinine urine ratio - CBC with Differential/Platelet - COMPLETE METABOLIC PANEL WITH GFR - Magnesium - Lipid panel - TSH - Hemoglobin A1c - Insulin, random - VITAMIN D 25 Hydroxy           Patient was counseled in prudent diet to achieve/maintain BMI less than 25 for weight control, BP monitoring, regular exercise and medications. Discussed med's effects and SE's. Screening labs and tests as requested with regular follow-up as recommended. Over 40 minutes  of exam, counseling, chart review and high complex critical decision making was performed.   Kirtland Bouchard, MD

## 2020-02-28 ENCOUNTER — Other Ambulatory Visit: Payer: Self-pay

## 2020-02-28 ENCOUNTER — Ambulatory Visit (INDEPENDENT_AMBULATORY_CARE_PROVIDER_SITE_OTHER): Payer: PPO | Admitting: Internal Medicine

## 2020-02-28 ENCOUNTER — Other Ambulatory Visit: Payer: Self-pay | Admitting: Internal Medicine

## 2020-02-28 VITALS — BP 160/80 | HR 72 | Temp 97.3°F | Resp 16 | Ht 63.0 in | Wt 158.2 lb

## 2020-02-28 DIAGNOSIS — E1122 Type 2 diabetes mellitus with diabetic chronic kidney disease: Secondary | ICD-10-CM | POA: Diagnosis not present

## 2020-02-28 DIAGNOSIS — G4733 Obstructive sleep apnea (adult) (pediatric): Secondary | ICD-10-CM

## 2020-02-28 DIAGNOSIS — B351 Tinea unguium: Secondary | ICD-10-CM

## 2020-02-28 DIAGNOSIS — N182 Chronic kidney disease, stage 2 (mild): Secondary | ICD-10-CM | POA: Diagnosis not present

## 2020-02-28 DIAGNOSIS — Z136 Encounter for screening for cardiovascular disorders: Secondary | ICD-10-CM | POA: Diagnosis not present

## 2020-02-28 DIAGNOSIS — Z0000 Encounter for general adult medical examination without abnormal findings: Secondary | ICD-10-CM

## 2020-02-28 DIAGNOSIS — E559 Vitamin D deficiency, unspecified: Secondary | ICD-10-CM

## 2020-02-28 DIAGNOSIS — Z79899 Other long term (current) drug therapy: Secondary | ICD-10-CM | POA: Diagnosis not present

## 2020-02-28 DIAGNOSIS — I10 Essential (primary) hypertension: Secondary | ICD-10-CM | POA: Diagnosis not present

## 2020-02-28 DIAGNOSIS — N3281 Overactive bladder: Secondary | ICD-10-CM

## 2020-02-28 DIAGNOSIS — Z8249 Family history of ischemic heart disease and other diseases of the circulatory system: Secondary | ICD-10-CM | POA: Diagnosis not present

## 2020-02-28 DIAGNOSIS — E1169 Type 2 diabetes mellitus with other specified complication: Secondary | ICD-10-CM | POA: Diagnosis not present

## 2020-02-28 DIAGNOSIS — Z1211 Encounter for screening for malignant neoplasm of colon: Secondary | ICD-10-CM

## 2020-02-28 DIAGNOSIS — Z0001 Encounter for general adult medical examination with abnormal findings: Secondary | ICD-10-CM

## 2020-02-28 DIAGNOSIS — E785 Hyperlipidemia, unspecified: Secondary | ICD-10-CM | POA: Diagnosis not present

## 2020-02-28 MED ORDER — ITRACONAZOLE 100 MG PO CAPS: ORAL_CAPSULE | ORAL | 0 refills | Status: DC

## 2020-02-28 MED ORDER — ITRACONAZOLE 100 MG PO CAPS
ORAL_CAPSULE | ORAL | 0 refills | Status: DC
Start: 2020-02-28 — End: 2020-02-28

## 2020-02-28 MED ORDER — OXYBUTYNIN CHLORIDE ER 10 MG PO TB24: ORAL_TABLET | ORAL | 0 refills | Status: DC

## 2020-02-28 MED ORDER — ATENOLOL 50 MG PO TABS: ORAL_TABLET | ORAL | 0 refills | Status: DC

## 2020-02-29 LAB — HEMOGLOBIN A1C
Hgb A1c MFr Bld: 6 % of total Hgb — ABNORMAL HIGH (ref <5.7)
Mean Plasma Glucose: 126 (calc)
eAG (mmol/L): 7 (calc)

## 2020-02-29 LAB — COMPLETE METABOLIC PANEL WITH GFR
AG Ratio: 1.8 (calc) (ref 1.0–2.5)
ALT: 37 U/L — ABNORMAL HIGH (ref 6–29)
AST: 31 U/L (ref 10–35)
Albumin: 4.6 g/dL (ref 3.6–5.1)
Alkaline phosphatase (APISO): 89 U/L (ref 37–153)
BUN: 14 mg/dL (ref 7–25)
CO2: 27 mmol/L (ref 20–32)
Calcium: 10.1 mg/dL (ref 8.6–10.4)
Chloride: 103 mmol/L (ref 98–110)
Creat: 0.86 mg/dL (ref 0.60–0.93)
GFR, Est African American: 77 mL/min/{1.73_m2} (ref >=60)
GFR, Est Non African American: 67 mL/min/{1.73_m2} (ref >=60)
Globulin: 2.6 g/dL (calc) (ref 1.9–3.7)
Glucose, Bld: 115 mg/dL — ABNORMAL HIGH (ref 65–99)
Potassium: 4.5 mmol/L (ref 3.5–5.3)
Sodium: 140 mmol/L (ref 135–146)
Total Bilirubin: 0.8 mg/dL (ref 0.2–1.2)
Total Protein: 7.2 g/dL (ref 6.1–8.1)

## 2020-02-29 LAB — URINALYSIS, ROUTINE W REFLEX MICROSCOPIC
Bacteria, UA: NONE SEEN /HPF
Bilirubin Urine: NEGATIVE
Glucose, UA: NEGATIVE
Hgb urine dipstick: NEGATIVE
Hyaline Cast: NONE SEEN /LPF
Ketones, ur: NEGATIVE
Nitrite: NEGATIVE
Protein, ur: NEGATIVE
RBC / HPF: NONE SEEN /HPF (ref 0–2)
Specific Gravity, Urine: 1.006 (ref 1.001–1.03)
Squamous Epithelial / HPF: NONE SEEN /HPF (ref <=5)
WBC, UA: NONE SEEN /HPF (ref 0–5)
pH: 6.5 (ref 5.0–8.0)

## 2020-02-29 LAB — CBC WITH DIFFERENTIAL/PLATELET
Absolute Monocytes: 595 cells/uL (ref 200–950)
Basophils Absolute: 29 cells/uL (ref 0–200)
Basophils Relative: 0.6 %
Eosinophils Absolute: 130 cells/uL (ref 15–500)
Eosinophils Relative: 2.7 %
HCT: 40.1 % (ref 35.0–45.0)
Hemoglobin: 13 g/dL (ref 11.7–15.5)
Lymphs Abs: 1378 cells/uL (ref 850–3900)
MCH: 26.4 pg — ABNORMAL LOW (ref 27.0–33.0)
MCHC: 32.4 g/dL (ref 32.0–36.0)
MCV: 81.3 fL (ref 80.0–100.0)
MPV: 9.7 fL (ref 7.5–12.5)
Monocytes Relative: 12.4 %
Neutro Abs: 2669 cells/uL (ref 1500–7800)
Neutrophils Relative %: 55.6 %
Platelets: 218 10*3/uL (ref 140–400)
RBC: 4.93 10*6/uL (ref 3.80–5.10)
RDW: 14.8 % (ref 11.0–15.0)
Total Lymphocyte: 28.7 %
WBC: 4.8 10*3/uL (ref 3.8–10.8)

## 2020-02-29 LAB — LIPID PANEL
Cholesterol: 139 mg/dL (ref <200)
HDL: 48 mg/dL — ABNORMAL LOW (ref >=50)
LDL Cholesterol (Calc): 65 mg/dL (calc)
Non-HDL Cholesterol (Calc): 91 mg/dL (calc) (ref <130)
Total CHOL/HDL Ratio: 2.9 (calc) (ref <5.0)
Triglycerides: 187 mg/dL — ABNORMAL HIGH (ref <150)

## 2020-02-29 LAB — MICROALBUMIN / CREATININE URINE RATIO
Creatinine, Urine: 32 mg/dL (ref 20–275)
Microalb Creat Ratio: 13 mcg/mg creat (ref <30)
Microalb, Ur: 0.4 mg/dL

## 2020-02-29 LAB — VITAMIN D 25 HYDROXY (VIT D DEFICIENCY, FRACTURES): Vit D, 25-Hydroxy: 49 ng/mL (ref 30–100)

## 2020-02-29 LAB — INSULIN, RANDOM: Insulin: 28 u[IU]/mL — ABNORMAL HIGH

## 2020-02-29 LAB — TSH: TSH: 0.85 mIU/L (ref 0.40–4.50)

## 2020-02-29 LAB — MAGNESIUM: Magnesium: 1.8 mg/dL (ref 1.5–2.5)

## 2020-02-29 NOTE — Progress Notes (Signed)
========================================================== -   Test results slightly outside the reference range are not unusual. If there is anything important, I will review this with you,  otherwise it is considered normal test values.  If you have further questions,  please do not hesitate to contact me at the office or via My Chart.  ==========================================================  -   Magnesium  -   1.8   -  very  low- goal is betw 2.0 - 2.5,   - So...............  Be sure that you take  Magnesium 500 mg tablet  x 3 tablets  /daily   - also important to eat lots of  leafy green vegetables   - spinach - Kale - collards - greens - okra - asparagus  - broccoli - quinoa - squash - almonds   - black, red, white beans  -  peas - green beans ==========================================================  -  Total Chol = 139 and LDL = 65 - Both  Excellent   - Very low risk for Heart Attack  / Stroke =============================================================  - A1c gone up again from 5.7% to now 6.0% - Worse    - Avoid Sweets, Candy & White Stuff   - Rice, Potatoes, Breads &  Pasta ==========================================================  -  Vitamin D = 49 - sl low   - Vitamin D goal is between 70-100.   - Please make sure that you are taking your      Vitamin D 10,000 units    /day  as directed.   - It is very important as a natural anti-inflammatory and helping the  immune system protect against viral infections, like the Covid-19    helping hair, skin, and nails, as well as reducing stroke and  heart attack risk.   - It helps your bones and helps with mood.  - It also decreases numerous cancer risks so please take it as directed.   - Low Vit D is associated with a 200-300% higher risk for CANCER   and 200-300% higher risk for HEART   ATTACK  &  STROKE.    - It is also associated with higher death rate at younger ages,   autoimmune diseases like  Rheumatoid arthritis, Lupus,  Multiple Sclerosis.     - Also many other serious conditions, like depression, Alzheimer's  Dementia, infertility, muscle aches, fatigue, fibromyalgia  - just to name a few.  ====================================================  - All Else - CBC - Kidneys - Electrolytes - Liver  - Magnesium & Thyroid    - all  Normal / OK ====================================================

## 2020-03-07 ENCOUNTER — Other Ambulatory Visit: Payer: Self-pay | Admitting: Internal Medicine

## 2020-03-07 DIAGNOSIS — E1122 Type 2 diabetes mellitus with diabetic chronic kidney disease: Secondary | ICD-10-CM

## 2020-03-20 ENCOUNTER — Other Ambulatory Visit: Payer: Self-pay

## 2020-03-20 ENCOUNTER — Ambulatory Visit (INDEPENDENT_AMBULATORY_CARE_PROVIDER_SITE_OTHER): Payer: PPO

## 2020-03-20 VITALS — Temp 97.6°F | Wt 160.0 lb

## 2020-03-20 DIAGNOSIS — Z23 Encounter for immunization: Secondary | ICD-10-CM | POA: Diagnosis not present

## 2020-03-22 ENCOUNTER — Other Ambulatory Visit: Payer: Self-pay

## 2020-03-22 DIAGNOSIS — Z1212 Encounter for screening for malignant neoplasm of rectum: Secondary | ICD-10-CM | POA: Diagnosis not present

## 2020-03-22 DIAGNOSIS — Z1211 Encounter for screening for malignant neoplasm of colon: Secondary | ICD-10-CM

## 2020-03-22 LAB — POC HEMOCCULT BLD/STL (HOME/3-CARD/SCREEN)
Card #2 Fecal Occult Blod, POC: NEGATIVE
Card #3 Fecal Occult Blood, POC: NEGATIVE
Fecal Occult Blood, POC: NEGATIVE

## 2020-04-20 ENCOUNTER — Other Ambulatory Visit: Payer: Self-pay | Admitting: Internal Medicine

## 2020-04-20 MED ORDER — DEXAMETHASONE 4 MG PO TABS: ORAL_TABLET | ORAL | 0 refills | Status: DC

## 2020-04-20 MED ORDER — PROMETHAZINE-DM 6.25-15 MG/5ML PO SYRP: ORAL_SOLUTION | ORAL | 1 refills | Status: DC

## 2020-04-20 MED ORDER — AZITHROMYCIN 250 MG PO TABS: ORAL_TABLET | ORAL | 0 refills | Status: DC

## 2020-05-04 ENCOUNTER — Other Ambulatory Visit: Payer: Self-pay | Admitting: Internal Medicine

## 2020-05-04 DIAGNOSIS — E782 Mixed hyperlipidemia: Secondary | ICD-10-CM

## 2020-05-04 DIAGNOSIS — I10 Essential (primary) hypertension: Secondary | ICD-10-CM

## 2020-05-04 DIAGNOSIS — N183 Chronic kidney disease, stage 3 unspecified: Secondary | ICD-10-CM

## 2020-05-04 DIAGNOSIS — R0989 Other specified symptoms and signs involving the circulatory and respiratory systems: Secondary | ICD-10-CM

## 2020-05-04 DIAGNOSIS — N3281 Overactive bladder: Secondary | ICD-10-CM

## 2020-05-04 DIAGNOSIS — E1122 Type 2 diabetes mellitus with diabetic chronic kidney disease: Secondary | ICD-10-CM

## 2020-05-04 MED ORDER — CLONIDINE HCL 0.2 MG PO TABS: ORAL_TABLET | ORAL | 1 refills | Status: DC

## 2020-06-01 ENCOUNTER — Other Ambulatory Visit: Payer: Self-pay | Admitting: Internal Medicine

## 2020-06-06 ENCOUNTER — Other Ambulatory Visit: Payer: Self-pay | Admitting: Internal Medicine

## 2020-06-06 DIAGNOSIS — I10 Essential (primary) hypertension: Secondary | ICD-10-CM

## 2020-06-07 ENCOUNTER — Other Ambulatory Visit: Payer: Self-pay | Admitting: Internal Medicine

## 2020-06-07 DIAGNOSIS — I10 Essential (primary) hypertension: Secondary | ICD-10-CM

## 2020-06-07 MED ORDER — VERAPAMIL HCL ER 240 MG PO TBCR: EXTENDED_RELEASE_TABLET | ORAL | 0 refills | Status: DC

## 2020-06-07 NOTE — Progress Notes (Signed)
FOLLOW UP  Assessment:   Essential hypertension Continue medications  Clonopin 0.1 mg PRN systolic 093+ due to labile values Monitor blood pressure at home; call if consistently over 140/90 Continue DASH diet.   Reminder to go to the ER if any CP, SOB, nausea, dizziness, severe HA, changes vision/speech, left arm numbness and tingling and jaw pain.  Hx of hyperthyroidism Monitor TSH levels - recently within normal range  Not on medicatoin Advised no biotin 2 weeks prior to TSH checks -     TSH  Type 2 diabetes mellitus with diabetic nephropathy, without long-term current use of insulin ([place]) Discussed general issues about diabetes pathophysiology and management., Educational material distributed., Suggested low cholesterol diet., Encouraged aerobic exercise., Discussed foot care.,  -     Hemoglobin A1c  CKD stage 2 due to type 2 diabetes mellitus (HCC) Increase fluids, avoid NSAIDS, monitor sugars, will monitor -     CMP WITH GFR  Medication management -     CBC with Differential/Platelet -     CMP/GFR  Mixed hyperlipidemia Continue medications: crestor 20 mg three days a week  Continue low cholesterol diet and exercise.  -     Lipid panel -     TSH  Vitamin D deficiency Continue to recommend supplementation for goal of 60-100 Defer vitamin D level  Onychomycosis  Improved; monitor for progress  Depression/anxiety Well controlled by low dose sertraline; off of xanax Well managed by current regimen; continue medications Stress management techniques discussed, increase water, good sleep hygiene discussed, increase exercise, and increase veggies.   Osteopenia- schedule dexa with next mammogram, continue Vit D and Ca, weight bearing exercises   Over 30 minutes of exam, counseling, chart review and critical decision making was performed Future Appointments  Date Time Provider [place]  09/28/2020 10:30 AM [name], MD [facility] None  12/12/2020 10:30  AM Ward Givens, NP [facility] None  12/28/2020 10:30 AM Liane Comber, NP [facility] None  03/30/2021 10:00 AM [name], MD [facility] None     Subjective:  Michele [name] is a 75 y.o. female who presents for 3 month follow up.   She has OSA on CPAP and follows with neurology, she does report wearing nightly.   Toenail fungus didn't improve with repeated courses of lamisil, was referred to podiatry and completed course of itraconazole with perceived benefit.   she has a diagnosis of anxiety and is currently prescribed zoloft 50 mg daily, recently was able to taper off of xanax.   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  Wt Readings from Last 3 Encounters:  06/08/20 160 lb (72.6 kg)  03/20/20 160 lb (72.6 kg)  02/28/20 158 lb 3.2 oz (71.8 kg)    Her blood pressure has been controlled at home, long history of labile values, hypertensive emergency several years ago and was in ER, reports recent home average is 130-140/76-80, today their BP is BP: (!) 110/56 She takes clonopin 2.3-5.5 mg PRN if systolic is 732. Took last night (170/85).  She does workout (works on a farm) She denies chest pain, shortness of breath, dizziness.   She is on cholesterol medication (rosuvastatin 10 mg three days a week) and denies myalgias. Her LDL was not at goal of <70. The cholesterol last visit was:   Lab Results  Component Value Date   CHOL 139 02/28/2020   HDL 48 (  L) 02/28/2020   LDLCALC 65 02/28/2020   TRIG 187 (H) 02/28/2020   CHOLHDL 2.9 02/28/2020    She has not been working on diet and exercise for T2 diabetes, well controlled with metformin 4 tabs daily with recent A1C in prediabetic range, and denies increased appetite, nausea, paresthesia of the feet, polydipsia, polyuria, visual disturbances and vomiting. She does check fasting glucose 110-130s. Last A1C in the office was:   Lab Results  Component Value Date   HGBA1C 6.0 (H) 02/28/2020   She has stable CKD II, on olmesartan, monitored via this office:  Lab Results  Component Value Date   GFRNONAA 67 02/28/2020   Patient is on Vitamin D supplement, taking 10000 IU daily:    Lab Results  Component Value Date   VD25OH 73 02/28/2020     She has a documented history of hyperthyroidism, suspected thyroiditis in 2017 which resolved spontaneously. Recent TSHs have been within normal range:    Lab Results  Component Value Date   TSH 0.85 02/28/2020    Medication Review: Current Outpatient Medications on File Prior to Visit  Medication Sig Dispense Refill   APPLE CIDER VINEGAR PO Take by mouth. Takes daily.     aspirin 325 MG EC tablet Take 325 mg by mouth daily.     atenolol (TENORMIN) 50 MG tablet TAKE 1 TO 2 TABLETS BY MOUTH IN THE MORNING FOR BLOOD PRESSURE 180 tablet 1   blood glucose meter kit and supplies Test sugar once a day. 1 each 0   Blood Glucose Monitoring Suppl DEVI Test sugars once daily.DX diabetes 1 each 0   Cholecalciferol (VITAMIN D PO) Take 10,000 Units by mouth daily.      cloNIDine (CATAPRES) 0.2 MG tablet Take      1 tablet       3 x /day        for BP 270 tablet 1   Cyanocobalamin (VITAMIN B-12 PO) Take 1 tablet by mouth daily.     glucose blood test strip Test sugar once a day DX diabetes 100 each 12   Lancets MISC Test blood sugar once daily 100 each 11   Magnesium 500 MG TABS Take 1 tablet by mouth 3 (three) times daily.     metFORMIN (GLUCOPHAGE) 500 MG tablet TAKE 1 TABLET BY MOUTH TWICE DAILY WITH  BREAKFAST  AND  LUNCH  AND  2  TABLETS  WITH  SUPPER  FOR  DIABETES 360 tablet 0   minoxidil (LONITEN) 10 MG tablet TAKE 1/2 TO 1 (ONE-HALF TO ONE) TABLET BY MOUTH ONCE DAILY FOR BLOOD PRESSURE 90 tablet 0   olmesartan (BENICAR) 40 MG tablet Take 1 tablet by mouth once daily for blood pressure 90 tablet 0   Omega-3 Fatty Acids (FISH OIL PO) Take 1 capsule by mouth  daily.     oxybutynin (DITROPAN-XL) 10 MG 24 hr tablet TAKE 1 TABLET BY MOUTH IN THE MORNING FOR  BLADDER  CONTROL 90 tablet 0   promethazine-dextromethorphan (PROMETHAZINE-DM) 6.25-15 MG/5ML syrup Take 1 to 2 tsp enery 4 hours if needed for cough 240 mL 1   sertraline (ZOLOFT) 50 MG tablet TAKE 1 TABLET BY MOUTH ONCE DAILY FOR  MOOD 90 tablet 0   verapamil (CALAN-SR) 240 MG CR tablet Take     1 tablet     Daily      with Food       for BP 90 tablet 0   BIOTIN PO Take 1 tablet by  mouth daily. (Patient not taking: Reported on 06/08/2020)     Cinnamon 500 MG capsule Take 500 mg by mouth daily. (Patient not taking: Reported on 06/08/2020)     itraconazole (SPORANOX) 100 MG capsule Take     2 capsules      Daily       For Toenail Fungus (Patient not taking: Reported on 06/08/2020) 180 capsule 0   No current facility-administered medications on file prior to visit.    Allergies  Allergen Reactions   Lipitor [Atorvastatin]     Elevates LFT's    Current Problems (verified) Patient Active Problem List   Diagnosis Date Noted   Osteopenia 06/08/2020   Depression, major, recurrent, in partial remission ([place]) 11/02/2019   Anxiety 12/10/2018   Sleep apnea on CPAP 09/18/2018   Vitiligo 11/09/2017   Encounter for Medicare annual wellness exam 07/24/2017   Type 2 diabetes mellitus ([place]) 07/24/2017   Overweight (BMI 25.0-29.9) 07/24/2017   Cerebrovascular disease 01/02/2017   History of hyperthyroidism 09/11/2015   Vitamin D deficiency 06/28/2013   Medication management 06/28/2013   Anemia 06/28/2013   CKD stage 2 due to type 2 diabetes mellitus ([place])    Hyperlipidemia associated with type 2 diabetes mellitus ([place]) 03/21/2008   Essential hypertension 03/21/2008   GERD 03/21/2008   Fatty liver 03/21/2008   COLONIC POLYPS, ADENOMATOUS, HX OF 03/21/2008    Review of Systems  Constitutional: Negative for malaise/fatigue and weight loss.  HENT: Negative for hearing loss and  tinnitus.   Eyes: Negative for blurred vision and double vision.  Respiratory: Negative for cough, shortness of breath and wheezing.   Cardiovascular: Negative for chest pain, palpitations, orthopnea, claudication and leg swelling.  Gastrointestinal: Negative for abdominal pain, blood in stool, constipation, diarrhea, heartburn, melena, nausea and vomiting.  Genitourinary: Negative.   Musculoskeletal: Negative for joint pain and myalgias.  Skin: Negative for rash.  Neurological: Negative for dizziness, tingling, sensory change, weakness and headaches.  Endo/Heme/Allergies: Negative for polydipsia.  Psychiatric/Behavioral: Negative for depression and substance abuse. The patient is nervous/anxious. The patient does not have insomnia.   All other systems reviewed and are negative.   Objective:     Today's Vitals   06/08/20 1014  BP: (!) 110/56  Pulse: 83  Temp: (!) 97.5 F (36.4 C)  SpO2: 98%  Weight: 160 lb (72.6 kg)  Height: '5\' 3"'  (1.6 m)   Body mass index is 28.34 kg/m.  General appearance: alert, no distress, WD/WN, female HEENT: normocephalic, sclerae anicteric, TMs pearly, nares patent, no discharge or erythema, pharynx normal Oral cavity: MMM, no lesions Neck: supple, no lymphadenopathy, no thyromegaly, no masses Heart: RRR, normal S1, S2, no murmurs Lungs: CTA bilaterally, no wheezes, rhonchi, or rales Abdomen: +bs, soft, non tender, non distended, no masses, no hepatomegaly, no splenomegaly Musculoskeletal: nontender, no swelling, no obvious deformity Extremities: no edema, no cyanosis, no clubbing Pulses: 2+ symmetric, upper and lower extremities, normal cap refill Skin: warm/dry without concerning lesions, rash, ecchymosis; R great toe nail medial/distally thickened/brittle; nail bed intact  Neurological: alert, oriented x 3, CN2-12 intact, strength normal upper extremities and lower extremities, sensation normal throughout, DTRs 2+ throughout, no cerebellar signs,  gait normal Psychiatric: depressed affect, behavior normal, pleasant    Izora Ribas, NP   06/08/2020

## 2020-06-08 ENCOUNTER — Encounter: Payer: Self-pay | Admitting: Adult Health

## 2020-06-08 ENCOUNTER — Other Ambulatory Visit: Payer: Self-pay

## 2020-06-08 ENCOUNTER — Ambulatory Visit (INDEPENDENT_AMBULATORY_CARE_PROVIDER_SITE_OTHER): Payer: PPO | Admitting: Adult Health

## 2020-06-08 VITALS — BP 110/56 | HR 83 | Temp 97.5°F | Ht 63.0 in | Wt 160.0 lb

## 2020-06-08 DIAGNOSIS — N182 Chronic kidney disease, stage 2 (mild): Secondary | ICD-10-CM

## 2020-06-08 DIAGNOSIS — F419 Anxiety disorder, unspecified: Secondary | ICD-10-CM

## 2020-06-08 DIAGNOSIS — E785 Hyperlipidemia, unspecified: Secondary | ICD-10-CM | POA: Diagnosis not present

## 2020-06-08 DIAGNOSIS — D649 Anemia, unspecified: Secondary | ICD-10-CM

## 2020-06-08 DIAGNOSIS — I679 Cerebrovascular disease, unspecified: Secondary | ICD-10-CM | POA: Diagnosis not present

## 2020-06-08 DIAGNOSIS — E1122 Type 2 diabetes mellitus with diabetic chronic kidney disease: Secondary | ICD-10-CM

## 2020-06-08 DIAGNOSIS — E663 Overweight: Secondary | ICD-10-CM | POA: Diagnosis not present

## 2020-06-08 DIAGNOSIS — Z8639 Personal history of other endocrine, nutritional and metabolic disease: Secondary | ICD-10-CM | POA: Diagnosis not present

## 2020-06-08 DIAGNOSIS — E1169 Type 2 diabetes mellitus with other specified complication: Secondary | ICD-10-CM | POA: Diagnosis not present

## 2020-06-08 DIAGNOSIS — Z79899 Other long term (current) drug therapy: Secondary | ICD-10-CM | POA: Diagnosis not present

## 2020-06-08 DIAGNOSIS — I10 Essential (primary) hypertension: Secondary | ICD-10-CM

## 2020-06-08 DIAGNOSIS — E559 Vitamin D deficiency, unspecified: Secondary | ICD-10-CM | POA: Diagnosis not present

## 2020-06-08 DIAGNOSIS — F3341 Major depressive disorder, recurrent, in partial remission: Secondary | ICD-10-CM

## 2020-06-08 DIAGNOSIS — M1000 Idiopathic gout, unspecified site: Secondary | ICD-10-CM | POA: Diagnosis not present

## 2020-06-08 DIAGNOSIS — E782 Mixed hyperlipidemia: Secondary | ICD-10-CM

## 2020-06-08 DIAGNOSIS — M8580 Other specified disorders of bone density and structure, unspecified site: Secondary | ICD-10-CM | POA: Insufficient documentation

## 2020-06-08 MED ORDER — ROSUVASTATIN CALCIUM 20 MG PO TABS: ORAL_TABLET | ORAL | 1 refills | Status: DC

## 2020-06-08 NOTE — Patient Instructions (Signed)
Goals    . Blood Pressure < 130/80     Try to get BP more consistent Monitor daily    . Exercise 150 min/wk Moderate Activity    . HEMOGLOBIN A1C < 7.0      Try clonodine 0.1 mg (half tab) only instead of 0.2 to avoid blood pressures getting too low   Please schedule bone densit (DEXA) with next mammogram   [facility]  7 a.m.-6:30 p.m., Monday 7 a.m.-5 p.m., Tuesday-Friday Schedule an appointment by calling [phone].      Osteopenia  Osteopenia is a loss of thickness (density) inside of the bones. Another name for osteopenia is low bone mass. Mild osteopenia is a normal part of aging. It is not a disease, and it does not cause symptoms. However, if you have osteopenia and continue to lose bone mass, you could develop a condition that causes the bones to become thin and break more easily (osteoporosis). You may also lose some height, have back pain, and have a stooped posture. Although osteopenia is not a disease, making changes to your lifestyle and diet can help to prevent osteopenia from developing into osteoporosis. What are the causes? Osteopenia is caused by loss of calcium in the bones.  Bones are constantly changing. Old bone cells are continually being replaced with new bone cells. This process builds new bone. The mineral calcium is needed to build new bone and maintain bone density. Bone density is usually highest around age 36. After that, most people's bodies cannot replace all the bone they have lost with new bone. What increases the risk? You are more likely to develop this condition if:  You are older than age 78.  You are a woman who went through menopause early.  You have a long illness that keeps you in bed.  You do not get enough exercise.  You lack certain nutrients (malnutrition).  You have an overactive thyroid gland (hyperthyroidism).  You smoke.  You drink a lot of alcohol.  You are taking medicines that  weaken the bones, such as steroids. What are the signs or symptoms? This condition does not cause any symptoms. You may have a slightly higher risk for bone breaks (fractures), so getting fractures more easily than normal may be an indication of osteopenia. How is this diagnosed? Your health care provider can diagnose this condition with a special type of X-ray exam that measures bone density (dual-energy X-ray absorptiometry, DEXA). This test can measure bone density in your hips, spine, and wrists. Osteopenia has no symptoms, so this condition is usually diagnosed after a routine bone density screening test is done for osteoporosis. This routine screening is usually done for:  Women who are age 34 or older.  Men who are age 56 or older. If you have risk factors for osteopenia, you may have the screening test at an earlier age. How is this treated? Making dietary and lifestyle changes can lower your risk for osteoporosis. If you have severe osteopenia that is close to becoming osteoporosis, your health care provider may prescribe medicines and dietary supplements such as calcium and vitamin D. These supplements help to rebuild bone density. Follow these instructions at home:   Take over-the-counter and prescription medicines only as told by your health care provider. These include vitamins and supplements.  Eat a diet that is high in calcium and vitamin D. ? Calcium is found in dairy products, beans, salmon, and leafy green vegetables like spinach and broccoli. ?  Look for foods that have vitamin D and calcium added to them (fortified foods), such as orange juice, cereal, and bread.  Do 30 or more minutes of a weight-bearing exercise every day, such as walking, jogging, or playing a sport. These types of exercises strengthen the bones.  Take precautions at home to lower your risk of falling, such as: ? Keeping rooms well-lit and free of clutter, such as cords. ? Installing safety rails on  stairs. ? Using rubber mats in the bathroom or other areas that are often wet or slippery.  Do not use any products that contain nicotine or tobacco, such as cigarettes and e-cigarettes. If you need help quitting, ask your health care provider.  Avoid alcohol or limit alcohol intake to no more than 1 drink a day for nonpregnant women and 2 drinks a day for men. One drink equals 12 oz of beer, 5 oz of wine, or 1 oz of hard liquor.  Keep all follow-up visits as told by your health care provider. This is important. Contact a health care provider if:  You have not had a bone density screening for osteoporosis and you are: ? A woman, age 22 or older. ? A man, age 109 or older.  You are a postmenopausal woman who has not had a bone density screening for osteoporosis.  You are older than age 44 and you want to know if you should have bone density screening for osteoporosis. Summary  Osteopenia is a loss of thickness (density) inside of the bones. Another name for osteopenia is low bone mass.  Osteopenia is not a disease, but it may increase your risk for a condition that causes the bones to become thin and break more easily (osteoporosis).  You may be at risk for osteopenia if you are older than age 10 or if you are a woman who went through early menopause.  Osteopenia does not cause any symptoms, but it can be diagnosed with a bone density screening test.  Dietary and lifestyle changes are the first treatment for osteopenia. These may lower your risk for osteoporosis. This information is not intended to replace advice given to you by your health care provider. Make sure you discuss any questions you have with your health care provider. Document Revised: 05/02/2017 Document Reviewed: 02/26/2017 Elsevier Patient Education  2020 [employer].

## 2020-06-09 ENCOUNTER — Other Ambulatory Visit: Payer: Self-pay | Admitting: Adult Health

## 2020-06-09 DIAGNOSIS — N289 Disorder of kidney and ureter, unspecified: Secondary | ICD-10-CM

## 2020-06-10 LAB — TSH: TSH: 0.57 mIU/L (ref 0.40–4.50)

## 2020-06-10 LAB — COMPLETE METABOLIC PANEL WITH GFR
AG Ratio: 1.9 (calc) (ref 1.0–2.5)
ALT: 28 U/L (ref 6–29)
AST: 25 U/L (ref 10–35)
Albumin: 4.1 g/dL (ref 3.6–5.1)
Alkaline phosphatase (APISO): 70 U/L (ref 37–153)
BUN/Creatinine Ratio: 13 (calc) (ref 6–22)
BUN: 17 mg/dL (ref 7–25)
CO2: 27 mmol/L (ref 20–32)
Calcium: 9.8 mg/dL (ref 8.6–10.4)
Chloride: 105 mmol/L (ref 98–110)
Creat: 1.32 mg/dL — ABNORMAL HIGH (ref 0.60–0.93)
GFR, Est African American: 46 mL/min/{1.73_m2} — ABNORMAL LOW (ref >=60)
GFR, Est Non African American: 40 mL/min/{1.73_m2} — ABNORMAL LOW (ref >=60)
Globulin: 2.2 g/dL (calc) (ref 1.9–3.7)
Glucose, Bld: 116 mg/dL — ABNORMAL HIGH (ref 65–99)
Potassium: 4.4 mmol/L (ref 3.5–5.3)
Sodium: 140 mmol/L (ref 135–146)
Total Bilirubin: 0.9 mg/dL (ref 0.2–1.2)
Total Protein: 6.3 g/dL (ref 6.1–8.1)

## 2020-06-10 LAB — CBC WITH DIFFERENTIAL/PLATELET
Absolute Monocytes: 827 cells/uL (ref 200–950)
Basophils Absolute: 32 cells/uL (ref 0–200)
Basophils Relative: 0.6 %
Eosinophils Absolute: 101 cells/uL (ref 15–500)
Eosinophils Relative: 1.9 %
HCT: 35.9 % (ref 35.0–45.0)
Hemoglobin: 11.7 g/dL (ref 11.7–15.5)
Lymphs Abs: 1426 cells/uL (ref 850–3900)
MCH: 26.1 pg — ABNORMAL LOW (ref 27.0–33.0)
MCHC: 32.6 g/dL (ref 32.0–36.0)
MCV: 80 fL (ref 80.0–100.0)
MPV: 9.3 fL (ref 7.5–12.5)
Monocytes Relative: 15.6 %
Neutro Abs: 2915 cells/uL (ref 1500–7800)
Neutrophils Relative %: 55 %
Platelets: 179 10*3/uL (ref 140–400)
RBC: 4.49 10*6/uL (ref 3.80–5.10)
RDW: 15.7 % — ABNORMAL HIGH (ref 11.0–15.0)
Total Lymphocyte: 26.9 %
WBC: 5.3 10*3/uL (ref 3.8–10.8)

## 2020-06-10 LAB — MAGNESIUM: Magnesium: 2 mg/dL (ref 1.5–2.5)

## 2020-06-10 LAB — TEST AUTHORIZATION

## 2020-06-10 LAB — IRON,TIBC AND FERRITIN PANEL
%SAT: 15 % (calc) — ABNORMAL LOW (ref 16–45)
Ferritin: 48 ng/mL (ref 16–288)
Iron: 64 ug/dL (ref 45–160)
TIBC: 418 mcg/dL (calc) (ref 250–450)

## 2020-06-10 LAB — HEMOGLOBIN A1C
Hgb A1c MFr Bld: 6.3 % of total Hgb — ABNORMAL HIGH (ref <5.7)
Mean Plasma Glucose: 134 mg/dL
eAG (mmol/L): 7.4 mmol/L

## 2020-06-10 LAB — LIPID PANEL
Cholesterol: 139 mg/dL (ref <200)
HDL: 38 mg/dL — ABNORMAL LOW (ref >=50)
LDL Cholesterol (Calc): 76 mg/dL (calc)
Non-HDL Cholesterol (Calc): 101 mg/dL (calc) (ref <130)
Total CHOL/HDL Ratio: 3.7 (calc) (ref <5.0)
Triglycerides: 150 mg/dL — ABNORMAL HIGH (ref <150)

## 2020-06-12 ENCOUNTER — Other Ambulatory Visit: Payer: Self-pay

## 2020-06-12 DIAGNOSIS — I10 Essential (primary) hypertension: Secondary | ICD-10-CM

## 2020-06-12 MED ORDER — VERAPAMIL HCL ER 240 MG PO TBCR: EXTENDED_RELEASE_TABLET | ORAL | 0 refills | Status: DC

## 2020-06-13 DIAGNOSIS — G4733 Obstructive sleep apnea (adult) (pediatric): Secondary | ICD-10-CM | POA: Diagnosis not present

## 2020-06-21 ENCOUNTER — Ambulatory Visit (INDEPENDENT_AMBULATORY_CARE_PROVIDER_SITE_OTHER): Payer: PPO

## 2020-06-21 ENCOUNTER — Other Ambulatory Visit: Payer: Self-pay

## 2020-06-21 DIAGNOSIS — N289 Disorder of kidney and ureter, unspecified: Secondary | ICD-10-CM | POA: Diagnosis not present

## 2020-06-21 NOTE — Progress Notes (Signed)
PATIENT reports for lab visit.  Verapamil rx refilled needed because patient lost pills & pharmacy will not refill until [facility] so patient would like our office to send in refill that will be honored. (Maybe increase or lower dose)

## 2020-06-22 LAB — BASIC METABOLIC PANEL WITH GFR
BUN: 8 mg/dL (ref 7–25)
CO2: 27 mmol/L (ref 20–32)
Calcium: 9.4 mg/dL (ref 8.6–10.4)
Chloride: 104 mmol/L (ref 98–110)
Creat: 0.92 mg/dL (ref 0.60–0.93)
GFR, Est African American: 71 mL/min/{1.73_m2} (ref >=60)
GFR, Est Non African American: 61 mL/min/{1.73_m2} (ref >=60)
Glucose, Bld: 145 mg/dL — ABNORMAL HIGH (ref 65–99)
Potassium: 4 mmol/L (ref 3.5–5.3)
Sodium: 139 mmol/L (ref 135–146)

## 2020-07-13 ENCOUNTER — Other Ambulatory Visit: Payer: Self-pay | Admitting: Internal Medicine

## 2020-07-13 DIAGNOSIS — Z1231 Encounter for screening mammogram for malignant neoplasm of breast: Secondary | ICD-10-CM

## 2020-08-25 ENCOUNTER — Other Ambulatory Visit: Payer: Self-pay | Admitting: Internal Medicine

## 2020-08-25 DIAGNOSIS — N183 Chronic kidney disease, stage 3 unspecified: Secondary | ICD-10-CM

## 2020-08-25 DIAGNOSIS — N3281 Overactive bladder: Secondary | ICD-10-CM

## 2020-09-16 ENCOUNTER — Other Ambulatory Visit: Payer: Self-pay | Admitting: Internal Medicine

## 2020-09-27 ENCOUNTER — Encounter: Payer: Self-pay | Admitting: Internal Medicine

## 2020-09-27 NOTE — Patient Instructions (Signed)

## 2020-09-27 NOTE — Progress Notes (Signed)
Future Appointments  Date Time Provider [place]  09/28/2020 10:30 AM [name], MD [facility] None  12/12/2020 10:30 AM Ward Givens, NP [facility] None  12/28/2020 10:30 AM Liane Comber, NP [facility] None  03/30/2021 10:00 AM [name], MD [facility] None    History of Present Illness:       This very nice 75 y.o. [name] presents for 6 month follow up with HTN, HLD, T2_NIDDM and Vitamin D Deficiency. Patient has hx/o OSA with mask intolerance.       Patient is treated for HTN  (1978)  & BP has on occasion been very labile.  She had a Renal  Aa Duplex in 2014 that was negative and in 2017 a  Myoview that was Negative. Today's BP is at goal - 136/80. Patient has had no complaints of any cardiac type chest pain, palpitations, dyspnea / orthopnea / PND, dizziness, claudication, or dependent edema.       Hyperlipidemia is controlled with diet & Rosuvastatin 3 x/week. Patient denies myalgias or other med SE's. 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 Infrequently checks CBG's. Last A1c was not at goal:  Lab Results  Component Value Date   HGBA1C 6.3 (H) 06/08/2020            Further, the patient also has history of Vitamin D Deficiency ("19"/2008) and supplements vitamin D without any suspected side-effects. Last vitamin D was slightly low (goal 70-100):  Lab Results  Component Value Date   VD25OH 49 02/28/2020    Current Outpatient Medications on File Prior to Visit  Medication Sig  . aspirin 325 MG EC tablet Take  daily.  Marland Kitchen atenolol  50 MG tablet TAKE 1 TO 2 TABLETS  IN THE MORNING  . BIOTIN Take 1 tablet daily.  Marland Kitchen VITAMIN D  Take 10,000 Units  daily.   . cloNIDine  0.2 MG tablet Take 1 tablet 3 x /day   . VITAMIN B-12  Take 1 tablet  daily.  . Magnesium 500 MG TABS Take 1 tablet  3  times daily.  . metFORMIN  500 MG tablet Take 1 tab 2 x /day - Bkfst /Lunch & 2 tabs at [employer]  . Minoxidil 10 MG tablet TAKE 1/2-1 TABLET  DAILY   . olmesartan ( 40 MG tablet Take 1 tablet  daily for   . Omega-3 ISH OIL Take 1 capsule  daily.  Marland Kitchen oxybutyni -XL) 10 MG 24 hr tablet TAKE 1 TABLET IN THE MORNING   . rosuvastatin  20 MG tablet TAKE 1 TABLET  3 DAYS A WEEK   . sertraline  50 MG tablet Take  1 tablet  Daily=  . verapamil -SR 240 MG  Take one tablet daily with food     Allergies  Allergen Reactions  . Lipitor [Atorvastatin]     Elevates LFT's    PMHx:   Past Medical History:  Diagnosis Date  . Anemia   . Anxiety   . Arthritis   . Carpal tunnel syndrome   . Cerebrovascular disease 01/02/2017  . Duodenal diverticulum   . Elevated uric acid in blood   .  Esophageal motility disorder   . Fatty liver   . GERD (gastroesophageal reflux disease)   . Hyperlipidemia   . Hypertension   . 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 Sleep apnea   . Tubulovillous adenoma of colon   . Type II or unspecified type diabetes mellitus      Immunization History  Administered Date(s) Administered  . Influenza Split 03/25/2013  . Influenza, High Dose Seasonal PF 04/21/2017, 02/18/2018, 03/11/2019, 03/20/2020  . PFIZER  SARS-COV-2 Vacc  06/24/2019, 07/15/2019, 03/08/2020  . Pneumococcal -13 01/26/2014  . Pneumococcal -23 03/14/2011  . Td 03/25/2013  . Zoster 06/03/2005    Past Surgical History:  Procedure Laterality Date  . APPENDECTOMY    . BREAST BIOPSY Right   . CARPAL TUNNEL RELEASE Right 1992  . cataract extration    . COLONOSCOPY    . CYST REMOVAL HAND    . TONSILLECTOMY  1952    FHx:    Reviewed / unchanged  SHx:    Reviewed / unchanged   Systems Review:  Constitutional: Denies fever, chills, wt changes,  headaches, insomnia, fatigue, night sweats, change in appetite. Eyes: Denies redness, blurred vision, diplopia, discharge, itchy, watery eyes.  ENT: Denies discharge, congestion, post nasal drip, epistaxis, sore throat, earache, hearing loss, dental pain, tinnitus, vertigo, sinus pain, snoring.  CV: Denies chest pain, palpitations, irregular heartbeat, syncope, dyspnea, diaphoresis, orthopnea, PND, claudication or edema. Respiratory: denies cough, dyspnea, DOE, pleurisy, hoarseness, laryngitis, wheezing.  Gastrointestinal: Denies dysphagia, odynophagia, heartburn, reflux, water brash, abdominal pain or cramps, nausea, vomiting, bloating, diarrhea, constipation, hematemesis, melena, hematochezia  or hemorrhoids. Genitourinary: Denies dysuria, frequency, urgency, nocturia, hesitancy, discharge, hematuria or flank pain. Musculoskeletal: Denies arthralgias, myalgias, stiffness, jt. swelling, pain, limping or strain/sprain.  Skin: Denies pruritus, rash, hives, warts, acne, eczema or change in skin lesion(s). Neuro: No weakness, tremor, incoordination, spasms, paresthesia or pain. Psychiatric: Denies confusion, memory loss or sensory loss. Endo: Denies change in weight, skin or hair change.  Heme/Lymph: No excessive bleeding, bruising or enlarged lymph nodes.  Physical Exam  BP 136/80   P 74   T 97.3 F  R 16   Ht 5\' 3"     Wt 151 lb    SpO2 99%   BMI 26.78   Appears  well nourished, well groomed  and in no distress.  Eyes: PERRLA, EOMs, conjunctiva no swelling or erythema. Sinuses: No frontal/maxillary tenderness ENT/Mouth: EAC's clear, TM's nl w/o erythema, bulging. Nares clear w/o erythema, swelling, exudates. Oropharynx clear without erythema or exudates. Oral hygiene is good. Tongue normal, non obstructing. Hearing intact.  Neck: Supple. Thyroid not palpable. Car 2+/2+ without bruits, nodes or JVD. Chest: Respirations nl with BS clear & equal w/o rales, rhonchi, wheezing or stridor.   Cor: Heart sounds normal w/ regular rate and rhythm without sig. murmurs, gallops, clicks or rubs. Peripheral pulses normal and equal  without edema.  Abdomen: Soft & bowel sounds normal. Non-tender w/o guarding, rebound, hernias, masses or organomegaly.  Lymphatics: Unremarkable.  Musculoskeletal: Full ROM all peripheral extremities, joint stability, 5/5 strength and normal gait.  Skin: Warm, dry without exposed rashes, lesions or ecchymosis apparent.  Neuro: Cranial nerves intact, reflexes equal bilaterally. Sensory-motor testing grossly intact. Tendon reflexes grossly intact.  Pysch: Alert & oriented x 3.  Insight and judgement nl & appropriate. No ideations.  Assessment and Plan:  1. Essential hypertension  - Continue medication, monitor blood pressure at home.  - Continue DASH diet.  Reminder to go to the ER if any  CP,  SOB, nausea, dizziness, severe HA, changes vision/speech.  - CBC with Differential/Platelet - COMPLETE METABOLIC PANEL WITH GFR - Magnesium - TSH  2. Hyperlipidemia associated with type 2 diabetes mellitus ([place])  - Continue diet/meds, exercise,& lifestyle modifications.  - Continue monitor periodic cholesterol/liver & renal functions   - Lipid panel - TSH  3. Type 2 diabetes mellitus with stage 2 chronic kidney  disease, without long-term current use of insulin (HCC)  - Continue diet, exercise  - Lifestyle modifications.  - Monitor appropriate labs.  - Hemoglobin A1c - Insulin, random  4. Vitamin D deficiency  - Continue supplementation.  - VITAMIN D 25 Hydroxyl  5. OSA (obstructive sleep apnea)   6. Medication management  - CBC with Differential/Platelet - COMPLETE METABOLIC PANEL WITH GFR - Magnesium - Lipid panel - TSH - Hemoglobin A1c - Insulin, random - VITAMIN D 25 Hydroxyl         Discussed  regular exercise, BP monitoring, weight control to achieve/maintain BMI less than 25 and discussed med and SE's. Recommended labs to  assess and monitor clinical status with further disposition pending results of labs.  I discussed the assessment and treatment plan with the patient. The patient was provided an opportunity to ask questions and all were answered. The patient agreed with the plan and demonstrated an understanding of the instructions.  I provided over 30 minutes of exam, counseling, chart review and  complex critical decision making.        The patient was advised to call back or seek an in-person evaluation if the symptoms worsen or if the condition fails to improve as anticipated.   Kirtland Bouchard, MD

## 2020-09-28 ENCOUNTER — Other Ambulatory Visit: Payer: Self-pay

## 2020-09-28 ENCOUNTER — Ambulatory Visit (INDEPENDENT_AMBULATORY_CARE_PROVIDER_SITE_OTHER): Payer: PPO | Admitting: Internal Medicine

## 2020-09-28 VITALS — BP 136/80 | HR 74 | Temp 97.3°F | Resp 16 | Ht 63.0 in | Wt 151.2 lb

## 2020-09-28 DIAGNOSIS — E785 Hyperlipidemia, unspecified: Secondary | ICD-10-CM | POA: Diagnosis not present

## 2020-09-28 DIAGNOSIS — G4733 Obstructive sleep apnea (adult) (pediatric): Secondary | ICD-10-CM

## 2020-09-28 DIAGNOSIS — E1122 Type 2 diabetes mellitus with diabetic chronic kidney disease: Secondary | ICD-10-CM

## 2020-09-28 DIAGNOSIS — N182 Chronic kidney disease, stage 2 (mild): Secondary | ICD-10-CM | POA: Diagnosis not present

## 2020-09-28 DIAGNOSIS — I10 Essential (primary) hypertension: Secondary | ICD-10-CM | POA: Diagnosis not present

## 2020-09-28 DIAGNOSIS — E1169 Type 2 diabetes mellitus with other specified complication: Secondary | ICD-10-CM

## 2020-09-28 DIAGNOSIS — Z79899 Other long term (current) drug therapy: Secondary | ICD-10-CM

## 2020-09-28 DIAGNOSIS — E559 Vitamin D deficiency, unspecified: Secondary | ICD-10-CM

## 2020-09-29 LAB — CBC WITH DIFFERENTIAL/PLATELET
Absolute Monocytes: 572 cells/uL (ref 200–950)
Basophils Absolute: 32 cells/uL (ref 0–200)
Basophils Relative: 0.7 %
Eosinophils Absolute: 149 cells/uL (ref 15–500)
Eosinophils Relative: 3.3 %
HCT: 38.4 % (ref 35.0–45.0)
Hemoglobin: 12.1 g/dL (ref 11.7–15.5)
Lymphs Abs: 1292 cells/uL (ref 850–3900)
MCH: 24.4 pg — ABNORMAL LOW (ref 27.0–33.0)
MCHC: 31.5 g/dL — ABNORMAL LOW (ref 32.0–36.0)
MCV: 77.4 fL — ABNORMAL LOW (ref 80.0–100.0)
MPV: 9.2 fL (ref 7.5–12.5)
Monocytes Relative: 12.7 %
Neutro Abs: 2457 cells/uL (ref 1500–7800)
Neutrophils Relative %: 54.6 %
Platelets: 190 10*3/uL (ref 140–400)
RBC: 4.96 10*6/uL (ref 3.80–5.10)
RDW: 16.1 % — ABNORMAL HIGH (ref 11.0–15.0)
Total Lymphocyte: 28.7 %
WBC: 4.5 10*3/uL (ref 3.8–10.8)

## 2020-09-29 LAB — COMPLETE METABOLIC PANEL WITH GFR
AG Ratio: 2 (calc) (ref 1.0–2.5)
ALT: 30 U/L — ABNORMAL HIGH (ref 6–29)
AST: 34 U/L (ref 10–35)
Albumin: 4.5 g/dL (ref 3.6–5.1)
Alkaline phosphatase (APISO): 72 U/L (ref 37–153)
BUN: 11 mg/dL (ref 7–25)
CO2: 28 mmol/L (ref 20–32)
Calcium: 9.8 mg/dL (ref 8.6–10.4)
Chloride: 104 mmol/L (ref 98–110)
Creat: 0.84 mg/dL (ref 0.60–0.93)
GFR, Est African American: 79 mL/min/{1.73_m2} (ref >=60)
GFR, Est Non African American: 68 mL/min/{1.73_m2} (ref >=60)
Globulin: 2.3 g/dL (calc) (ref 1.9–3.7)
Glucose, Bld: 104 mg/dL — ABNORMAL HIGH (ref 65–99)
Potassium: 4.2 mmol/L (ref 3.5–5.3)
Sodium: 142 mmol/L (ref 135–146)
Total Bilirubin: 0.9 mg/dL (ref 0.2–1.2)
Total Protein: 6.8 g/dL (ref 6.1–8.1)

## 2020-09-29 LAB — VITAMIN D 25 HYDROXY (VIT D DEFICIENCY, FRACTURES): Vit D, 25-Hydroxy: 103 ng/mL — ABNORMAL HIGH (ref 30–100)

## 2020-09-29 LAB — LIPID PANEL
Cholesterol: 136 mg/dL (ref <200)
HDL: 48 mg/dL — ABNORMAL LOW (ref >=50)
LDL Cholesterol (Calc): 65 mg/dL (calc)
Non-HDL Cholesterol (Calc): 88 mg/dL (calc) (ref <130)
Total CHOL/HDL Ratio: 2.8 (calc) (ref <5.0)
Triglycerides: 155 mg/dL — ABNORMAL HIGH (ref <150)

## 2020-09-29 LAB — TSH: TSH: 1.44 mIU/L (ref 0.40–4.50)

## 2020-09-29 LAB — HEMOGLOBIN A1C
Hgb A1c MFr Bld: 6 % of total Hgb — ABNORMAL HIGH (ref <5.7)
Mean Plasma Glucose: 126 mg/dL
eAG (mmol/L): 7 mmol/L

## 2020-09-29 LAB — INSULIN, RANDOM: Insulin: 16.2 u[IU]/mL

## 2020-09-29 LAB — MAGNESIUM: Magnesium: 1.9 mg/dL (ref 1.5–2.5)

## 2020-09-29 NOTE — Progress Notes (Signed)
============================================================ -   Test results slightly outside the reference range are not unusual. If there is anything important, I will review this with you,  otherwise it is considered normal test values.  If you have further questions,  please do not hesitate to contact me at the office or via My Chart.  ============================================================ ============================================================  -  Still mild anemia that looks Iron Deficient. Recc take an Iron pill supplement - Over the Counter pill ============================================================ ============================================================  -  Total chol = 136 and LDL = 65 - Both  Excellent   - Very low risk for Heart Attack  / Stroke ============================================================ ============================================================  -  A1c = 6.0% - Better - Your blood sugar and A1c are elevated.    It is very important that you work harder with diet by  avoiding all foods that are white except chicken,   fish & calliflower.  - Avoid white rice  (brown & wild rice is OK),   - Avoid white potatoes  (sweet potatoes in moderation is OK),   White bread or wheat bread or anything made out of   white flour like bagels, donuts, rolls, buns, biscuits, cakes,  - pastries, cookies, pizza crust, and pasta (made from  white flour & egg whites)   - vegetarian pasta or spinach or wheat pasta is OK.  - Multigrain breads like Arnold's, Pepperidge Farm or   multigrain sandwich thins or high fiber breads like   E[place]ka bread or "Dave's Killer" breads that are  4 to 5 grams fiber per slice !  are best.   ============================================================ ============================================================  -  Vitamin D = 103 Great  - Please keep dose same           (Ideal or goal is between 70-115)   ============================================================ ============================================================  -  All Else -  Kidneys - Electrolytes - Liver - Magnesium & Thyroid    - all  Normal / OK ============================================================ ============================================================  -  Keep up the [place] Work !  ============================================================ ============================================================

## 2020-10-03 ENCOUNTER — Other Ambulatory Visit: Payer: Self-pay | Admitting: *Deleted

## 2020-10-03 MED ORDER — BLOOD GLUCOSE MONITOR KIT: PACK | 0 refills | Status: DC

## 2020-11-02 ENCOUNTER — Other Ambulatory Visit: Payer: Self-pay | Admitting: Adult Health

## 2020-11-02 DIAGNOSIS — I10 Essential (primary) hypertension: Secondary | ICD-10-CM

## 2020-11-08 ENCOUNTER — Other Ambulatory Visit: Payer: Self-pay | Admitting: Internal Medicine

## 2020-11-08 DIAGNOSIS — R0989 Other specified symptoms and signs involving the circulatory and respiratory systems: Secondary | ICD-10-CM

## 2020-11-08 DIAGNOSIS — I10 Essential (primary) hypertension: Secondary | ICD-10-CM

## 2020-11-09 ENCOUNTER — Other Ambulatory Visit: Payer: Self-pay | Admitting: Internal Medicine

## 2020-11-09 DIAGNOSIS — N3281 Overactive bladder: Secondary | ICD-10-CM

## 2020-11-09 DIAGNOSIS — N183 Chronic kidney disease, stage 3 unspecified: Secondary | ICD-10-CM

## 2020-11-13 ENCOUNTER — Ambulatory Visit: Payer: PPO | Admitting: Adult Health

## 2020-11-29 DIAGNOSIS — Z961 Presence of intraocular lens: Secondary | ICD-10-CM | POA: Diagnosis not present

## 2020-11-29 DIAGNOSIS — E119 Type 2 diabetes mellitus without complications: Secondary | ICD-10-CM | POA: Diagnosis not present

## 2020-11-29 DIAGNOSIS — H52203 Unspecified astigmatism, bilateral: Secondary | ICD-10-CM | POA: Diagnosis not present

## 2020-11-29 DIAGNOSIS — H18513 Endothelial corneal dystrophy, bilateral: Secondary | ICD-10-CM | POA: Diagnosis not present

## 2020-11-29 LAB — HM DIABETES EYE EXAM

## 2020-11-30 ENCOUNTER — Encounter: Payer: Self-pay | Admitting: Internal Medicine

## 2020-12-07 ENCOUNTER — Ambulatory Visit: Payer: PPO | Admitting: Adult Health

## 2020-12-12 ENCOUNTER — Encounter: Payer: Self-pay | Admitting: Adult Health

## 2020-12-12 ENCOUNTER — Ambulatory Visit: Payer: PPO | Admitting: Adult Health

## 2020-12-12 VITALS — BP 159/76 | HR 81 | Ht 63.0 in | Wt 152.6 lb

## 2020-12-12 DIAGNOSIS — G4733 Obstructive sleep apnea (adult) (pediatric): Secondary | ICD-10-CM

## 2020-12-12 DIAGNOSIS — Z9989 Dependence on other enabling machines and devices: Secondary | ICD-10-CM | POA: Diagnosis not present

## 2020-12-12 NOTE — Progress Notes (Signed)
PATIENT: Michele [name] DOB: [birth year]  REASON FOR VISIT: follow up HISTORY FROM: patient Primary neurologist: Dr. Brett Fairy  HISTORY OF PRESENT ILLNESS: Today 12/12/20:  Michele [name] is a 75 year old female with a history of obstructive sleep apnea on CPAP.  She returns today for follow-up.  She denies any new issues with the CPAP.  She reports that she still has some daytime fatigue but reports that she is very active throughout the day.  They mow 7 yards and they have a garden that they maintain.  Patient also does a lot of canning vegetables.  She returns today for an evaluation.    12/13/19: Michele [name] is a 75 year old female with a history of obstructive sleep apnea on CPAP.  Her download indicates that she used her machine 29 out of 30 days for compliance of 97%.  She used her machine greater than 4 hours each night.  On average she uses her machine 5 hours and 44 minutes.  Her residual AHI is 1.7 on 5 to 10 cm of water with EPR 3.  Leak in the 95th percentile is 24.1 L/min.  HISTORY 12/10/18 :   Michele [name] is a 75 year old female with a history of obstructive sleep apnea on CPAP.  Her download indicates that she use her machine nightly for compliance of 100%.  She used her machine greater than 4 hours 29 days for compliance of 97%.  On average she uses her machine 6 hours and 8 minutes.  Her residual AHI is 2.5 on 5-10 cmH2O with EPR of 3.  Her leak in the 95th percentile is 27.6 L/min.  She reports that some mornings she will wake up with a headache however she feels that this is irritation from wearing the mask and straps.  She reports that she is not interested in changing the style of mask at this time.  REVIEW OF SYSTEMS: Out of a complete 14 system review of symptoms, the patient complains only of the following symptoms, and all other reviewed systems are negative.  FSS 38 ESS 4  ALLERGIES: Allergies  Allergen Reactions   Lipitor [Atorvastatin]     Elevates LFT's     HOME MEDICATIONS: Outpatient Medications Prior to Visit  Medication Sig Dispense Refill   blood glucose meter kit and supplies KIT Check blood sugar 1 time daily-DX-E11.22 1 each 0   aspirin 325 MG EC tablet Take 325 mg by mouth daily.     atenolol (TENORMIN) 50 MG tablet TAKE 1 TO 2 TABLETS BY MOUTH IN THE MORNING FOR BLOOD PRESSURE 180 tablet 1   BIOTIN PO Take 1 tablet by mouth daily.     blood glucose meter kit and supplies Test sugar once a day. 1 each 0   Blood Glucose Monitoring Suppl DEVI Test sugars once daily.DX diabetes 1 each 0   Cholecalciferol (VITAMIN D PO) Take 10,000 Units by mouth daily.      cloNIDine (CATAPRES) 0.2 MG tablet Take      1 tablet       3 x /day        for BP 270 tablet 1   Cyanocobalamin (VITAMIN B-12 PO) Take 1 tablet by mouth daily.     glucose blood test strip Test sugar once a day DX diabetes 100 each 12   Lancets MISC Test blood sugar once daily 100 each 11   Magnesium 500 MG TABS Take 1 tablet by mouth 3 (three) times daily.     metFORMIN (GLUCOPHAGE) 500  MG tablet Take  2 tablets  2 x /day with Meals  for Diabetes 360 tablet 0   minoxidil (LONITEN) 10 MG tablet Take 1/2 to 1 tablet Daily for BP 90 tablet 3   minoxidil (LONITEN) 10 MG tablet Take  1/2 to 1 tablet  Daily  for BP 90 tablet 3   olmesartan (BENICAR) 40 MG tablet Take  1 tablet   Daily for BP 90 tablet 3   Omega-3 Fatty Acids (FISH OIL PO) Take 1 capsule by mouth daily.     oxybutynin (DITROPAN-XL) 10 MG 24 hr tablet Take  1 tablet  Daily  for Bladder 90 tablet 0   rosuvastatin (CRESTOR) 20 MG tablet TAKE 1 TABLET BY MOUTH 3 DAYS A WEEK FOR CHOLESTEROL 39 tablet 1   sertraline (ZOLOFT) 50 MG tablet Take  1 tablet  Daily  for Mood 90 tablet 3   verapamil (CALAN-SR) 240 MG CR tablet Take  1 tablet  Daily  with Food for BP & Heart Rhythm 90 tablet 3   No facility-administered medications prior to visit.    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   Sleep apnea    Tubulovillous adenoma of colon    Type II or unspecified type diabetes mellitus without mention of complication, not stated as uncontrolled     PAST SURGICAL HISTORY: Past Surgical History:  Procedure Laterality Date   APPENDECTOMY     BREAST BIOPSY Right    CARPAL TUNNEL RELEASE Right 1992   cataract extration     COLONOSCOPY     CYST REMOVAL HAND     TONSILLECTOMY  1952    FAMILY HISTORY: Family History  Problem Relation Age of Onset   Heart disease Mother    Diabetes Mother    Colon polyps Mother    Heart disease Father    Diabetes Father    Heart attack Father    Heart disease Brother    Heart attack Brother    Cerebral aneurysm Sister    Heart disease Brother        Stent   Colon cancer Neg Hx    Esophageal cancer Neg Hx    Stomach cancer Neg Hx    Rectal cancer Neg Hx    Breast cancer Neg Hx     SOCIAL HISTORY: Social History   Socioeconomic History   Marital status: Married    Spouse name: Trilby Drummer   Number of children: 1   Years of education: 12   Highest education level: Not on file  Occupational History   Occupation: Retired    Fish farm manager: [employer]  Tobacco Use   Smoking status: Never   Smokeless tobacco: Never  Vaping Use   Vaping Use: Never used  Substance and Sexual Activity   Alcohol use: No   Drug use: No   Sexual activity: Never    Partners: Male    Comment: Married  Other Topics Concern   Not on file  Social History Narrative   Lives with husband.  Independent of ADLs. Retired but stays active with [employer]  part time.   Right-handed   Caffeine: about 1 cup of coffee per day, tea and Cokes a few times per  week   Social Determinants of Health   Financial Resource Strain: Not on file  Food Insecurity: Not on file  Transportation Needs: Not on file  Physical Activity: Not on file  Stress: Not on file  Social Connections: Not on file  Intimate Partner Violence: Not on file      PHYSICAL EXAM  Vitals:   12/12/20 1022  BP: (!) 159/76  Pulse: 81  Weight: 152 lb 9.6 oz (69.2 kg)  Height: '5\' 3"'  (1.6 m)   Body mass index is 27.03 kg/m.  Generalized: Well developed, in no acute distress  Chest: Lungs clear to auscultation bilaterally  Neurological examination  Mentation: Alert oriented to time, place, history taking. Follows all commands speech and language fluent Cranial nerve II-XII: Extraocular movements were full, visual field were full on confrontational test Head turning and shoulder shrug  were normal and symmetric. Motor: The motor testing reveals 5 over 5 strength of all 4 extremities. Good symmetric motor tone is noted throughout.  Sensory: Sensory testing is intact to soft touch on all 4 extremities. No evidence of extinction is noted.  Gait and station: Gait is normal.    DIAGNOSTIC DATA (LABS, IMAGING, TESTING) - I reviewed patient records, labs, notes, testing and imaging myself where available.  Lab Results  Component Value Date   WBC 4.5 09/28/2020   HGB 12.1 09/28/2020   HCT 38.4 09/28/2020   MCV 77.4 (L) 09/28/2020   PLT 190 09/28/2020      Component Value Date/Time   NA 142 09/28/2020 1021   K 4.2 09/28/2020 1021   CL 104 09/28/2020 1021   CO2 28 09/28/2020 1021   GLUCOSE 104 (H) 09/28/2020 1021   BUN 11 09/28/2020 1021   CREATININE 0.84 09/28/2020 1021   CALCIUM 9.8 09/28/2020 1021   PROT 6.8 09/28/2020 1021   ALBUMIN 4.6 01/09/2017 1730   AST 34 09/28/2020 1021   ALT 30 (H) 09/28/2020 1021   ALKPHOS 66 01/09/2017 1730   BILITOT 0.9 09/28/2020 1021   GFRNONAA 68 09/28/2020 1021   GFRAA 79 09/28/2020 1021   Lab Results  Component Value Date    CHOL 136 09/28/2020   HDL 48 (L) 09/28/2020   LDLCALC 65 09/28/2020   TRIG 155 (H) 09/28/2020   CHOLHDL 2.8 09/28/2020   Lab Results  Component Value Date   HGBA1C 6.0 (H) 09/28/2020   Lab Results  Component Value Date   VITAMINB12 721 02/16/2016   Lab Results  Component Value Date   TSH 1.44 09/28/2020      ASSESSMENT AND PLAN 75 y.o. year old female  has a past medical history of Anemia, Anxiety, Arthritis, Carpal tunnel syndrome, Cerebrovascular disease (01/02/2017), Duodenal diverticulum, Elevated uric acid in blood, Esophageal motility disorder, Fatty liver, GERD (gastroesophageal reflux disease), Hyperlipidemia, Hypertension, Lymphocytic colitis (09/22/12), Sleep apnea, Tubulovillous adenoma of colon, and Type II or unspecified type diabetes mellitus without mention of complication, not stated as uncontrolled. here with:  OSA on CPAP  - CPAP compliance excellent - Good treatment of AHI  - Encourage patient to use CPAP nightly and > 4 hours each night - F/U in 1 year or sooner if needed   Ward Givens, MSN, NP-C 12/12/2020, 10:45 AM [facility] [address], [place], [place] [address] [phone]

## 2020-12-12 NOTE — Patient Instructions (Signed)
Continue using CPAP nightly and greater than 4 hours each night °If your symptoms worsen or you develop new symptoms please let us know.  ° °

## 2020-12-13 ENCOUNTER — Ambulatory Visit
Admission: RE | Admit: 2020-12-13 | Discharge: 2020-12-13 | Disposition: A | Discharge status: Home / Self Care | Payer: PPO | Source: Ambulatory Visit | Attending: Adult Health | Admitting: Adult Health

## 2020-12-13 ENCOUNTER — Other Ambulatory Visit: Payer: Self-pay

## 2020-12-13 ENCOUNTER — Ambulatory Visit
Admission: RE | Admit: 2020-12-13 | Discharge: 2020-12-13 | Disposition: A | Discharge status: Home / Self Care | Payer: PPO | Source: Ambulatory Visit | Attending: Internal Medicine | Admitting: Internal Medicine

## 2020-12-13 DIAGNOSIS — Z780 Asymptomatic menopausal state: Secondary | ICD-10-CM | POA: Diagnosis not present

## 2020-12-13 DIAGNOSIS — Z1231 Encounter for screening mammogram for malignant neoplasm of breast: Secondary | ICD-10-CM

## 2020-12-13 DIAGNOSIS — M85852 Other specified disorders of bone density and structure, left thigh: Secondary | ICD-10-CM | POA: Diagnosis not present

## 2020-12-13 DIAGNOSIS — M8580 Other specified disorders of bone density and structure, unspecified site: Secondary | ICD-10-CM

## 2020-12-14 NOTE — Progress Notes (Signed)
============================================================ ============================================================  -    Bone density shows Osteopenia (NOT Osteoporosis)   - So take your Vitamin D,                                  Calcium 500-600 mg /day  &                                                                       - EXERCISE   - Recommend repeat bone density  in 2 years

## 2020-12-14 NOTE — Progress Notes (Signed)
============================================================ ============================================================  -    Bone Density shows OSTEOPENIA  (no Oateoporosis)  - So Recommend take your Vitamin D,  Take calcium     500  to 600 mg  /day and Exercise  (Walk)   - bill mck

## 2020-12-28 ENCOUNTER — Encounter: Payer: Self-pay | Admitting: Adult Health

## 2020-12-28 ENCOUNTER — Ambulatory Visit (INDEPENDENT_AMBULATORY_CARE_PROVIDER_SITE_OTHER): Payer: PPO | Admitting: Adult Health

## 2020-12-28 ENCOUNTER — Other Ambulatory Visit: Payer: Self-pay

## 2020-12-28 ENCOUNTER — Ambulatory Visit
Admission: RE | Admit: 2020-12-28 | Discharge: 2020-12-28 | Disposition: A | Discharge status: Home / Self Care | Payer: PPO | Source: Ambulatory Visit | Attending: Adult Health | Admitting: Adult Health

## 2020-12-28 VITALS — BP 137/81 | HR 70 | Temp 97.7°F | Ht 63.0 in | Wt 150.0 lb

## 2020-12-28 DIAGNOSIS — Z79899 Other long term (current) drug therapy: Secondary | ICD-10-CM

## 2020-12-28 DIAGNOSIS — E1169 Type 2 diabetes mellitus with other specified complication: Secondary | ICD-10-CM

## 2020-12-28 DIAGNOSIS — G4730 Sleep apnea, unspecified: Secondary | ICD-10-CM

## 2020-12-28 DIAGNOSIS — M545 Low back pain, unspecified: Secondary | ICD-10-CM

## 2020-12-28 DIAGNOSIS — G8929 Other chronic pain: Secondary | ICD-10-CM

## 2020-12-28 DIAGNOSIS — Z8639 Personal history of other endocrine, nutritional and metabolic disease: Secondary | ICD-10-CM

## 2020-12-28 DIAGNOSIS — I679 Cerebrovascular disease, unspecified: Secondary | ICD-10-CM | POA: Diagnosis not present

## 2020-12-28 DIAGNOSIS — I10 Essential (primary) hypertension: Secondary | ICD-10-CM

## 2020-12-28 DIAGNOSIS — Z0001 Encounter for general adult medical examination with abnormal findings: Secondary | ICD-10-CM

## 2020-12-28 DIAGNOSIS — E1122 Type 2 diabetes mellitus with diabetic chronic kidney disease: Secondary | ICD-10-CM

## 2020-12-28 DIAGNOSIS — E559 Vitamin D deficiency, unspecified: Secondary | ICD-10-CM

## 2020-12-28 DIAGNOSIS — F3341 Major depressive disorder, recurrent, in partial remission: Secondary | ICD-10-CM

## 2020-12-28 DIAGNOSIS — K219 Gastro-esophageal reflux disease without esophagitis: Secondary | ICD-10-CM | POA: Diagnosis not present

## 2020-12-28 DIAGNOSIS — Z86010 Personal history of colonic polyps: Secondary | ICD-10-CM | POA: Diagnosis not present

## 2020-12-28 DIAGNOSIS — K760 Fatty (change of) liver, not elsewhere classified: Secondary | ICD-10-CM

## 2020-12-28 DIAGNOSIS — F419 Anxiety disorder, unspecified: Secondary | ICD-10-CM

## 2020-12-28 DIAGNOSIS — N182 Chronic kidney disease, stage 2 (mild): Secondary | ICD-10-CM

## 2020-12-28 DIAGNOSIS — M25551 Pain in right hip: Secondary | ICD-10-CM

## 2020-12-28 DIAGNOSIS — E785 Hyperlipidemia, unspecified: Secondary | ICD-10-CM

## 2020-12-28 DIAGNOSIS — R6889 Other general symptoms and signs: Secondary | ICD-10-CM | POA: Diagnosis not present

## 2020-12-28 DIAGNOSIS — E663 Overweight: Secondary | ICD-10-CM

## 2020-12-28 DIAGNOSIS — L80 Vitiligo: Secondary | ICD-10-CM | POA: Diagnosis not present

## 2020-12-28 DIAGNOSIS — Z0000 Encounter for general adult medical examination without abnormal findings: Secondary | ICD-10-CM

## 2020-12-28 NOTE — Progress Notes (Signed)
MEDICARE WELLNESS  Assessment:    Encounter for Medicare annual wellness exam Due annually  Health maintenance reviewed  Type 2 diabetes mellitus with stage 2 chronic kidney disease, without long-term current use of insulin (HCC) -     Hemoglobin A1c -     glucose blood test strip; Test sugar once a day DX diabetes Discussed general issues about diabetes pathophysiology and management., Educational material distributed., Suggested low cholesterol diet., Encouraged aerobic exercise., Discussed foot care., Reminded to get yearly retinal exam.  Hyperlipidemia associated with type 2 diabetes mellitus ([place]) -     Lipid panel check lipids, LDL goal <70 decrease fatty foods increase activity.   CKD stage 2 due to type 2 diabetes mellitus (HCC) Increase fluids, avoid NSAIDS, monitor sugars, will monitor -     CMP/GFR  Essential hypertension Continue medications Monitor blood pressure at home; call if consistently over 140/80 Continue DASH diet.   Reminder to go to the ER if any CP, SOB, nausea, dizziness, severe HA, changes vision/speech, left arm numbness and tingling and jaw pain. -     CBC with Differential/Platelet -     COMPLETE METABOLIC PANEL WITH GFR -     TSH  Medication management -     Magnesium  Vitamin D deficiency Continue supplement  Cerebrovascular disease Control blood pressure, cholesterol, glucose, increase exercise.   Sleep apnea, unspecified type Last sleep study 12/2017. [facility].  Continue CPAP   Fatty liver Monitor LFTs Weight loss by low processed [place] diet advised  Overweight (BMI 25.0-29.9) - long discussion about weight loss, diet, and exercise -recommended diet heavy in fruits and veggies and low in animal meats, cheeses, and dairy products  History of hyperthyroidism Continue to check TSH  COLONIC POLYPS, ADENOMATOUS, HX OF Due 2024, high fiber diet encouraged  Gastroesophageal reflux disease, unspecified whether esophagitis  present Continue PPI/H2 blocker, diet discussed  Vitiligo Monitor  Depression, major, recurrent, in partial remission (HCC)/ anxiety - continue medications, stress management techniques discussed, increase water, good sleep hygiene discussed, increase exercise, and increase veggies.   Toenail fungus S/p lamisil 2 courses, does feel improved, normal at bases. Monitor -   Vertigo -     meclizine (ANTIVERT) 25 MG tablet; 1/2-1 pill up to 3 times daily for motion sickness/dizziness Normal neuro  Chronic midline low back pain without sciatica Chronic right hip pain Pending xrays give steroid taper vs NSAID Follow up in 2 weeks if not improving, consider PT/ortho Neg straight leg raise or clear radicular sx -     DG HIP UNILAT W OR W/O PELVIS 2-3 VIEWS RIGHT; Future -     DG Lumbar Spine Complete; Future  Over 30 minutes of exam, counseling, chart review and critical decision making was performed Future Appointments  Date Time Provider [place]  03/30/2021 10:00 AM [name], MD [facility] None  12/18/2021 10:30 AM Ward Givens, NP [facility] None    Plan:   During the course of the visit the patient was educated and counseled about appropriate screening and preventive services including:   Pneumococcal vaccine  Prevnar 13 Influenza vaccine Td vaccine Screening electrocardiogram Bone densitometry screening Colorectal cancer screening Diabetes screening Glaucoma screening Nutrition counseling  Advanced directives: requested     Subjective:  Michele [name] is a 75 y.o. female who presents for 3 month follow up and medicare wellness visit. She has Hyperlipidemia associated with type 2 diabetes mellitus ([place]); Essential hypertension; GERD; Fatty liver; COLONIC POLYPS, ADENOMATOUS, HX OF; CKD stage 2  due to type 2 diabetes mellitus ([place]); Vitamin D deficiency; Medication management; History of hyperthyroidism; Cerebrovascular disease; Encounter for Medicare  annual wellness exam; Type 2 diabetes mellitus ([place]); Overweight (BMI 25.0-29.9); Vitiligo; Sleep apnea on CPAP; Anxiety; Depression, major, recurrent, in partial remission ([place]); and Osteopenia on their problem list.  She reports persistent progressive lumbar back pain and R hip pain x 12 months. Doesn't recall injury prior to onset. She reports able to ignore while out working but bothers her after sitting and worse when she gets back up. Feels like pressure. Denies radiation. Takes tylenol and high dose aspirin every night for this. Heat didn't help.   Depression in remission on zoloft 50 mg daily.   BMI is Body mass index is 26.57 kg/m., she has not been working on diet and exercise but has garden and mows 6 yards, active and outdoors most of the day.  Wt Readings from Last 3 Encounters:  12/28/20 150 lb (68 kg)  12/12/20 152 lb 9.6 oz (69.2 kg)  09/28/20 151 lb 3.2 oz (68.6 kg)    Her blood pressure has been controlled at home, renal US 2014,  she is on benicar 66m, verapamil 240, minoxidil 10 mg , she will take clonidine 0.1 mg or atenolol 50 mg if her systolic is above 1419 will do this once a week or less often, today their BP is BP: 137/81.   She does have OSA, is on CPAP at least 4 hours a night but it will give her a headache. Last sleep study 12/2017. [facility].   She does workout (works on a farm) She denies chest pain, shortness of breath, dizziness.    She has not been working on diet and exercise for T2 diabetes With CKD II on olmesartan, is on ASA With hyperlipidemia crestor 20 mg M, W, S and denies myalgias,  at goal less than 70  well controlled with metformin with recent A1C diabetic range Fasting runs low 90s-120s denies increased appetite, nausea, paresthesia of the feet, polydipsia, polyuria, visual disturbances and vomiting.  Last A1C in the office was:  Lab Results  Component Value Date   HGBA1C 6.0 (H) 09/28/2020   Lab Results  Component Value Date   CHOL 136  09/28/2020   HDL 48 (L) 09/28/2020   LDLCALC 65 09/28/2020   TRIG 155 (H) 09/28/2020   CHOLHDL 2.8 09/28/2020   Lab Results  Component Value Date   GFRNONAA 68 09/28/2020   Patient is on Vitamin D supplement, taking 10000 IU daily:    Lab Results  Component Value Date   VD25OH 103 (H) 09/28/2020         Medication Review:  Current Outpatient Medications (Endocrine & Metabolic):    metFORMIN (GLUCOPHAGE) 500 MG tablet, Take  2 tablets  2 x /day with Meals  for Diabetes  Current Outpatient Medications (Cardiovascular):    atenolol (TENORMIN) 50 MG tablet, TAKE 1 TO 2 TABLETS BY MOUTH IN THE MORNING FOR BLOOD PRESSURE   cloNIDine (CATAPRES) 0.2 MG tablet, Take      1 tablet       3 x /day        for BP   minoxidil (LONITEN) 10 MG tablet, Take 1/2 to 1 tablet Daily for BP   olmesartan (BENICAR) 40 MG tablet, Take  1 tablet   Daily for BP   rosuvastatin (CRESTOR) 20 MG tablet, TAKE 1 TABLET BY MOUTH 3 DAYS A WEEK FOR CHOLESTEROL   verapamil (CALAN-SR) 240 MG CR  tablet, Take  1 tablet  Daily  with Food for BP & Heart Rhythm   minoxidil (LONITEN) 10 MG tablet, Take  1/2 to 1 tablet  Daily  for BP   Current Outpatient Medications (Analgesics):    aspirin 325 MG EC tablet, Take 325 mg by mouth daily.  Current Outpatient Medications (Hematological):    Cyanocobalamin (VITAMIN B-12 PO), Take 1 tablet by mouth daily.  Current Outpatient Medications (Other):    BIOTIN PO, Take 1 tablet by mouth daily.   blood glucose meter kit and supplies KIT, Check blood sugar 1 time daily-[id number]   blood glucose meter kit and supplies, Test sugar once a day.   Blood Glucose Monitoring Suppl DEVI, Test sugars once daily.DX diabetes   Calcium Carbonate (CALCIUM 600 PO), Take by mouth daily.   Cholecalciferol (VITAMIN D PO), Take 10,000 Units by mouth daily.    glucose blood test strip, Test sugar once a day DX diabetes   Lancets MISC, Test blood sugar once daily   Magnesium 500 MG TABS, Take 1  tablet by mouth 3 (three) times daily.   Menaquinone-7 (VITAMIN K2 PO), Take by mouth daily.   Omega-3 Fatty Acids (FISH OIL PO), Take 1 capsule by mouth daily.   oxybutynin (DITROPAN-XL) 10 MG 24 hr tablet, Take  1 tablet  Daily  for Bladder   sertraline (ZOLOFT) 50 MG tablet, Take  1 tablet  Daily  for Mood   Current Problems (verified) Patient Active Problem List   Diagnosis Date Noted   Osteopenia 06/08/2020   Depression, major, recurrent, in partial remission ([place]) 11/02/2019   Anxiety 12/10/2018   Sleep apnea on CPAP 09/18/2018   Vitiligo 11/09/2017   Encounter for Medicare annual wellness exam 07/24/2017   Type 2 diabetes mellitus ([place]) 07/24/2017   Overweight (BMI 25.0-29.9) 07/24/2017   Cerebrovascular disease 01/02/2017   History of hyperthyroidism 09/11/2015   Vitamin D deficiency 06/28/2013   Medication management 06/28/2013   CKD stage 2 due to type 2 diabetes mellitus ([place])    Hyperlipidemia associated with type 2 diabetes mellitus ([place]) 03/21/2008   Essential hypertension 03/21/2008   GERD 03/21/2008   Fatty liver 03/21/2008   COLONIC POLYPS, ADENOMATOUS, HX OF 03/21/2008   Allergies Allergies  Allergen Reactions   Lipitor [Atorvastatin]     Elevates LFT's   Immunization History  Administered Date(s) Administered   Influenza Split 03/25/2013   Influenza, High Dose Seasonal PF 03/15/2014, 03/07/2015, 03/27/2016, 04/21/2017, 02/18/2018, 03/11/2019, 03/20/2020   PFIZER(Purple Top)SARS-COV-2 Vaccination 06/24/2019, 07/15/2019, 03/08/2020   Pneumococcal Conjugate-13 01/26/2014   Pneumococcal Polysaccharide-23 03/14/2011   Td 03/25/2013   Zoster, Live 06/03/2005   Colonoscopy: 02/2018 Dr. Henrene Pastor, 5 year recall Mammogram: 12/2020 DEXA: 12/2020 - T-1.2   Influenza: 2020  Pneumonia: 2/2, last 2015 Shingrix: reports got at [facility]- dates requested Covid 19: 2/2 + booster  Vision: Dr. Ellie Lunch - last 11/2020- no retinopathy Dental: Dr. Claudie Leach, last 2022  Patient Care  Team: [name], MD as PCP - General (Internal Medicine)   SURGICAL HISTORY She  has a past surgical history that includes Appendectomy; Tonsillectomy (1952); Carpal tunnel release (Right, 1992); Cyst removal hand; cataract extration; Colonoscopy; and Breast biopsy (Right). FAMILY HISTORY Her family history includes Cerebral aneurysm in her sister; Colon polyps in her mother; Diabetes in her father and mother; Heart attack in her brother and father; Heart disease in her brother, brother, father, and mother. SOCIAL HISTORY She  reports that she has never smoked. She has never used smokeless  tobacco. She reports that she does not drink alcohol and does not use drugs.  MEDICARE WELLNESS OBJECTIVES: Physical activity: Current Exercise Habits: The patient does not participate in regular exercise at present, Exercise limited by: orthopedic condition(s) Cardiac risk factors: Cardiac Risk Factors include: advanced age (>48mn, >>57women);dyslipidemia;hypertension;diabetes mellitus Depression/mood screen:   [facility] 12/28/2020  Decreased Interest 0  Down, Depressed, Hopeless 1  PHQ - 2 Score 1  Altered sleeping -  Tired, decreased energy -  Change in appetite -  Feeling bad or failure about yourself  -  Trouble concentrating -  Moving slowly or fidgety/restless -  Suicidal thoughts -  PHQ-9 Score -  Difficult doing work/chores -  Some recent data might be hidden    ADLs:  In your present state of health, do you have any difficulty performing the following activities: 12/28/2020 09/27/2020  Hearing? N N  Vision? N N  Difficulty concentrating or making decisions? N N  Walking or climbing stairs? N N  Dressing or bathing? N N  Doing errands, shopping? N N  Some recent data might be hidden     Cognitive Testing  Alert? Yes  Normal Appearance?Yes  Oriented to person? Yes  Place? Yes   Time? Yes  Recall of three objects?  Yes  Can perform simple calculations?  Yes  Displays appropriate judgment?Yes  Can read the correct time from a watch face?Yes  EOL planning: Does Patient Have a Medical Advance Directive?: Yes Type of Advance Directive: Healthcare Power of Attorney, Living will Does patient want to make changes to medical advance directive?: No - Patient declined Copy of HCentraliain Chart?: No - copy requested      Review of Systems  Constitutional:  Negative for malaise/fatigue and weight loss.  HENT:  Negative for ear pain (Intermittent, R ear, improved on allergy medication), hearing loss and tinnitus.   Eyes:  Negative for blurred vision and double vision.  Respiratory:  Negative for cough, shortness of breath and wheezing.   Cardiovascular:  Negative for chest pain, palpitations, orthopnea, claudication and leg swelling.  Gastrointestinal:  Negative for abdominal pain, blood in stool, constipation, diarrhea, heartburn, melena, nausea and vomiting.  Genitourinary: Negative.   Musculoskeletal:  Positive for back pain (lumbar R) and joint pain (R hip). Negative for falls and myalgias.  Skin:  Negative for rash.  Neurological:  Positive for dizziness. Negative for tingling, tremors, sensory change, speech change, focal weakness, seizures, loss of consciousness, weakness and headaches.  Endo/Heme/Allergies:  Negative for polydipsia.  Psychiatric/Behavioral:  Negative for depression, memory loss and substance abuse. The patient is not nervous/anxious and does not have insomnia.   All other systems reviewed and are negative.  Objective:     Today's Vitals   12/28/20 1026  BP: 137/81  Pulse: 70  Temp: 97.7 F (36.5 C)  SpO2: 98%  Weight: 150 lb (68 kg)  Height: 5' 3" (1.6 m)   Body mass index is 26.57 kg/m. General Appearance: Well nourished, in no apparent distress. Eyes: conjunctiva no swelling or erythema ENT/Mouth: No erythema, swelling, or exudate on post pharynx.  Tonsils not swollen or erythematous.  Hearing normal.  Neck: Supple Respiratory: Respiratory effort normal, BS equal bilaterally without rales, rhonchi, wheezing or stridor.  Cardio: RRR with no holosystolic murmur. Brisk peripheral pulses without edema.  Abdomen: Soft, + BS.  Non tender, no guarding, rebound, hernias, masses. Lymphatics: no palpable lymphadenopathy Musculoskeletal: Symmetrical strength, normal gait. She has lumbar pain  with ROM limited in all spheres, neg straight leg raise, L hip ROM normal, R with guarding secondary to lumbar/gluteal pain, no crepitus or popping. No tenderness laterally. Some SI joint tenderness, gluteal tenderness.  Skin: Warm, dry without rashes, lesions, ecchymosis. Irregular patchy brown discoloration throughout. Bilateral big toe nails with thickening, brittle and yellow, no erythema. Normal at bases.  Neuro: Cranial nerves intact. Normal muscle tone, no cerebellar symptoms. Sensation intact.  Psych: Awake and oriented X 3, normal affect, Insight and Judgment appropriate.     Medicare Attestation I have personally reviewed: The patient's medical and social history Their use of alcohol, tobacco or illicit drugs Their current medications and supplements The patient's functional ability including ADLs,fall risks, home safety risks, cognitive, and hearing and visual impairment Diet and physical activities Evidence for depression or mood disorders  The patient's weight, height, BMI, and visual acuity have been recorded in the chart.  I have made referrals, counseling, and provided education to the patient based on review of the above and I have provided the patient with a written personalized care plan for preventive services.     Izora Ribas, NP   12/28/2020

## 2020-12-28 NOTE — Patient Instructions (Addendum)
Please send [place] shingrix vaccine information   Please keep a log of blood pressures, which meds you took, when, what blood pressure looked like in 1-2 hours, etc and bring this to your next visit    To get your xray:  Please get your  xray at [address] [facility], can walk in without an appointment M-F, 8-4pm     Chronic Back Pain When back pain lasts longer than 3 months, it is called chronic back pain. Pain may get worse at certain times (flare-ups). There are things you can do at home to manage your pain. Follow these instructions at home: Pay attention to any changes in your symptoms. Take these actions to help withyour pain: Managing pain and stiffness     If told, put ice on the painful area. Your doctor may tell you to use ice for 24-48 hours after the flare-up starts. To do this: Put ice in a plastic bag. Place a towel between your skin and the bag. Leave the ice on for 20 minutes, 2-3 times a day. If told, put heat on the painful area. Do this as often as told by your doctor. Use the heat source that your doctor recommends, such as a moist heat pack or a heating pad. Place a towel between your skin and the heat source. Leave the heat on for 20-30 minutes. Take off the heat if your skin turns bright red. This is especially important if you are unable to feel pain, heat, or cold. You may have a greater risk of getting burned. Soak in a warm bath. This can help relieve pain. Activity  Avoid bending and other activities that make pain worse. When standing: Keep your upper back and neck straight. Keep your shoulders pulled back. Avoid slouching. When sitting: Keep your back straight. Relax your shoulders. Do not round your shoulders or pull them backward. Do not sit or stand in one place for long periods of time. Take short rest breaks during the day. Lying down or standing is usually better than sitting. Resting can help relieve pain. When sitting or lying  down for a long time, do some mild activity or stretching. This will help to prevent stiffness and pain. Get regular exercise. Ask your doctor what activities are safe for you. Do not lift anything that is heavier than 10 lb (4.5 kg) or the limit that you are told, until your doctor says that it is safe. To prevent injury when you lift things: Bend your knees. Keep the weight close to your body. Avoid twisting. Sleep on a firm mattress. Try lying on your side with your knees slightly bent. If you lie on your back, put a pillow under your knees.  Medicines Treatment may include medicines for pain and swelling taken by mouth or put on the skin, prescription pain medicine, or muscle relaxants. Take over-the-counter and prescription medicines only as told by your doctor. Ask your doctor if the medicine prescribed to you: Requires you to avoid driving or using machinery. Can cause trouble pooping (constipation). You may need to take these actions to prevent or treat trouble pooping: Drink enough fluid to keep your pee (urine) pale yellow. Take over-the-counter or prescription medicines. Eat foods that are high in fiber. These include beans, whole grains, and fresh fruits and vegetables. Limit foods that are high in fat and sugars. These include fried or sweet foods. General instructions Do not use any products that contain nicotine or tobacco, such as cigarettes,  e-cigarettes, and chewing tobacco. If you need help quitting, ask your doctor. Keep all follow-up visits as told by your doctor. This is important. Contact a doctor if: Your pain does not get better with rest or medicine. Your pain gets worse, or you have new pain. You have a high fever. You lose weight very quickly. You have trouble doing your normal activities. Get help right away if: One or both of your legs or feet feel weak. One or both of your legs or feet lose feeling (have numbness). You have trouble controlling when you  poop (have a bowel movement) or pee (urinate). You have bad back pain and: You feel like you may vomit (nauseous), or you vomit. You have pain in your belly (abdomen). You have shortness of breath. You faint. Summary When back pain lasts longer than 3 months, it is called chronic back pain. Pain may get worse at certain times (flare-ups). Use ice and heat as told by your doctor. Your doctor may tell you to use ice after flare-ups. This information is not intended to replace advice given to you by your health care provider. Make sure you discuss any questions you have with your healthcare provider. Document Revised: 06/30/2019 Document Reviewed: 06/30/2019 Elsevier Patient Education  2022 [employer].

## 2020-12-29 LAB — COMPLETE METABOLIC PANEL WITH GFR
AG Ratio: 1.9 (calc) (ref 1.0–2.5)
ALT: 28 U/L (ref 6–29)
AST: 31 U/L (ref 10–35)
Albumin: 4.3 g/dL (ref 3.6–5.1)
Alkaline phosphatase (APISO): 69 U/L (ref 37–153)
BUN: 15 mg/dL (ref 7–25)
CO2: 27 mmol/L (ref 20–32)
Calcium: 10.4 mg/dL (ref 8.6–10.4)
Chloride: 103 mmol/L (ref 98–110)
Creat: 0.91 mg/dL (ref 0.60–1.00)
Globulin: 2.3 g/dL (calc) (ref 1.9–3.7)
Glucose, Bld: 81 mg/dL (ref 65–99)
Potassium: 4.6 mmol/L (ref 3.5–5.3)
Sodium: 139 mmol/L (ref 135–146)
Total Bilirubin: 0.9 mg/dL (ref 0.2–1.2)
Total Protein: 6.6 g/dL (ref 6.1–8.1)
eGFR: 66 mL/min/{1.73_m2} (ref >=60)

## 2020-12-29 LAB — CBC WITH DIFFERENTIAL/PLATELET
Absolute Monocytes: 715 cells/uL (ref 200–950)
Basophils Absolute: 28 cells/uL (ref 0–200)
Basophils Relative: 0.5 %
Eosinophils Absolute: 171 cells/uL (ref 15–500)
Eosinophils Relative: 3.1 %
HCT: 35.8 % (ref 35.0–45.0)
Hemoglobin: 11.8 g/dL (ref 11.7–15.5)
Lymphs Abs: 1634 cells/uL (ref 850–3900)
MCH: 26.6 pg — ABNORMAL LOW (ref 27.0–33.0)
MCHC: 33 g/dL (ref 32.0–36.0)
MCV: 80.8 fL (ref 80.0–100.0)
MPV: 9.4 fL (ref 7.5–12.5)
Monocytes Relative: 13 %
Neutro Abs: 2954 cells/uL (ref 1500–7800)
Neutrophils Relative %: 53.7 %
Platelets: 176 10*3/uL (ref 140–400)
RBC: 4.43 10*6/uL (ref 3.80–5.10)
RDW: 16.2 % — ABNORMAL HIGH (ref 11.0–15.0)
Total Lymphocyte: 29.7 %
WBC: 5.5 10*3/uL (ref 3.8–10.8)

## 2020-12-29 LAB — HEMOGLOBIN A1C
Hgb A1c MFr Bld: 5.9 % of total Hgb — ABNORMAL HIGH (ref <5.7)
Mean Plasma Glucose: 123 mg/dL
eAG (mmol/L): 6.8 mmol/L

## 2020-12-29 LAB — MAGNESIUM: Magnesium: 2 mg/dL (ref 1.5–2.5)

## 2020-12-29 LAB — LIPID PANEL
Cholesterol: 122 mg/dL (ref <200)
HDL: 43 mg/dL — ABNORMAL LOW (ref >=50)
LDL Cholesterol (Calc): 54 mg/dL (calc)
Non-HDL Cholesterol (Calc): 79 mg/dL (calc) (ref <130)
Total CHOL/HDL Ratio: 2.8 (calc) (ref <5.0)
Triglycerides: 173 mg/dL — ABNORMAL HIGH (ref <150)

## 2020-12-29 LAB — TSH: TSH: 1.03 mIU/L (ref 0.40–4.50)

## 2021-01-01 ENCOUNTER — Encounter: Payer: Self-pay | Admitting: Adult Health

## 2021-01-01 ENCOUNTER — Other Ambulatory Visit: Payer: Self-pay | Admitting: Adult Health

## 2021-01-01 DIAGNOSIS — M47816 Spondylosis without myelopathy or radiculopathy, lumbar region: Secondary | ICD-10-CM | POA: Insufficient documentation

## 2021-01-01 MED ORDER — MELOXICAM 15 MG PO TABS: ORAL_TABLET | ORAL | 0 refills | Status: DC

## 2021-01-01 MED ORDER — ASPIRIN EC 81 MG PO TBEC: 81.0000 mg | DELAYED_RELEASE_TABLET | Freq: Every day | ORAL | Status: DC

## 2021-01-15 ENCOUNTER — Other Ambulatory Visit: Payer: Self-pay

## 2021-01-15 DIAGNOSIS — N182 Chronic kidney disease, stage 2 (mild): Secondary | ICD-10-CM

## 2021-01-15 MED ORDER — GLUCOSE BLOOD VI STRP: ORAL_STRIP | 12 refills | Status: DC

## 2021-01-16 ENCOUNTER — Telehealth: Payer: Self-pay | Admitting: Adult Health

## 2021-01-16 ENCOUNTER — Other Ambulatory Visit: Payer: Self-pay | Admitting: Adult Health

## 2021-01-16 MED ORDER — METHYLPREDNISOLONE 4 MG PO TABS: ORAL_TABLET | ORAL | 0 refills | Status: DC

## 2021-01-16 NOTE — Telephone Encounter (Signed)
status on back pain after 10 days on meloxicam- very little benefit. Pain is worst at bedtime and mid day. Admits to working in the yard. She would like any additional recommendations.

## 2021-01-23 ENCOUNTER — Telehealth: Payer: Self-pay | Admitting: Internal Medicine

## 2021-01-23 NOTE — Progress Notes (Signed)
  Chronic Care Management   Outreach Note  01/23/2021 Name: Michele [name] MRN: [id number] DOB: [birth year]  Referred by: [name], MD Reason for referral : No chief complaint on file.   An unsuccessful telephone outreach was attempted today. The patient was referred to the pharmacist for assistance with care management and care coordination.   Follow Up Plan:   Tatjana Dellinger Up[employer] Scheduler

## 2021-01-24 ENCOUNTER — Telehealth: Payer: Self-pay | Admitting: Internal Medicine

## 2021-01-24 NOTE — Progress Notes (Signed)
  Chronic Care Management   Note  01/24/2021 Name: Michele [name] MRN: [id number] DOB: 02-03-1946  Michele [name] is a 75 y.o. year old female who is a primary care patient of [name], MD. I reached out to [place] by phone today in response to a referral sent by Michele [name]'s PCP, [name], MD.   Michele [name] was given information about Chronic Care Management services today including:  CCM service includes personalized support from designated clinical staff supervised by her physician, including individualized plan of care and coordination with other care providers 24/7 contact phone numbers for assistance for urgent and routine care needs. Service will only be billed when office clinical staff spend 20 minutes or more in a month to coordinate care. Only one practitioner may furnish and bill the service in a calendar month. The patient may stop CCM services at any time (effective at the end of the month) by phone call to the office staff.   Patient agreed to services and verbal consent obtained.   Follow up plan:   Tatjana Secretary/administrator

## 2021-01-29 DIAGNOSIS — D2239 Melanocytic nevi of other parts of face: Secondary | ICD-10-CM | POA: Diagnosis not present

## 2021-01-29 DIAGNOSIS — L80 Vitiligo: Secondary | ICD-10-CM | POA: Diagnosis not present

## 2021-01-29 DIAGNOSIS — D692 Other nonthrombocytopenic purpura: Secondary | ICD-10-CM | POA: Diagnosis not present

## 2021-01-29 DIAGNOSIS — L814 Other melanin hyperpigmentation: Secondary | ICD-10-CM | POA: Diagnosis not present

## 2021-01-29 DIAGNOSIS — D225 Melanocytic nevi of trunk: Secondary | ICD-10-CM | POA: Diagnosis not present

## 2021-02-02 ENCOUNTER — Other Ambulatory Visit: Payer: Self-pay | Admitting: Internal Medicine

## 2021-02-02 DIAGNOSIS — E782 Mixed hyperlipidemia: Secondary | ICD-10-CM

## 2021-03-08 ENCOUNTER — Encounter: Payer: PPO | Admitting: Internal Medicine

## 2021-03-12 ENCOUNTER — Encounter: Payer: PPO | Admitting: Internal Medicine

## 2021-03-29 ENCOUNTER — Encounter: Payer: Self-pay | Admitting: Internal Medicine

## 2021-03-29 NOTE — Progress Notes (Signed)
Annual Screening/Preventative Visit & Comprehensive Evaluation &  Examination  Future Appointments  Date Time Provider [place]  03/30/2021 10:00 AM [name], MD [facility] None  04/03/2021  1:30 PM Newton Pigg, [place] [facility] None  12/18/2021 10:30 AM Ward Givens, NP [facility] None  04/01/2022 10:00 AM [name], MD [facility] None        This very nice 75 y.o. [name]  presents for a Screening /Preventative Visit & comprehensive evaluation and management of multiple medical co-morbidities.  Patient has been followed for HTN, HLD, T2_NIDDM  and Vitamin D Deficiency.  Patient has hx/o OSA, but was Mask Intolerant.  Patient has hx/o Depression in partial remission on her meds.         HTN predates since 32. Patient has long hx/o very labile elevated BP's. Patient denies any cardiac symptoms as chest pain, palpitations, shortness of breath, dizziness or ankle swelling. Today's BP was initially slightly elevated & rechecked at goal - 138/82 .       Patient's hyperlipidemia is controlled with diet and medications. Patient denies myalgias or other medication SE's. Last lipids were at goal except sl  elevated Trig's :  Lab Results  Component Value Date   CHOL 122 12/28/2020   HDL 43 (L) 12/28/2020   LDLCALC 54 12/28/2020   TRIG 173 (H) 12/28/2020   CHOLHDL 2.8 12/28/2020         Patient has hx/o T2_NIDDM 2006) /CKD2 (GFR 69) and patient denies reactive hypoglycemic symptoms, visual blurring, diabetic polys or paresthesias. Last A1c was near goal :  Lab Results  Component Value Date   HGBA1C 5.9 (H) 12/28/2020         Finally, patient has history of Vitamin D Deficiency ("19"/2008)  and last Vitamin D was at goal :  Lab Results  Component Value Date   VD25OH 103 (H) 09/28/2020     Current Outpatient Medications on File Prior to Visit  Medication Sig   aspirin 81 MG tablet Take  daily.   atenolol  50 MG tablet TAKE 1 TO 2 TABLETS IN THE MORNING     BIOTIN  Take 1 tablet  daily.   Calcium 600  Take  daily.   VITAMIN D  Take 10,000 Units daily.    cloNIDine 0.2 MG tablet Take      1 tablet       3 x /day     VITAMIN B-12 tab Take 1 tablet daily.   Magnesium 500 MG TABS Take 1 tablet 3 times daily.   VITAMIN K2  Take daily.   metFORMIN  500 MG tablet Take  2 tablets  2 x /day with Meals     minoxidil 10 MG tablet Take 1/2 to 1 tablet Daily    olmesartan 40 MG tablet Take  1 tablet   Daily    Omega-3 FISH OIL P Take 1 capsule daily.   oxybutynin-XL  10 MG  Take  1 tablet  Daily     rosuvastatin 20 MG tablet TAKE 1 TABLET  DAILY    sertraline 50 MG tablet Take  1 tablet  Daily    verapamil -SR 240 MG CR tablet Take  1 tablet  Daily       Allergies  Allergen Reactions   Lipitor [Atorvastatin]     Elevates LFT's     Past Medical History:  Diagnosis Date   Anemia    Anxiety    Arthritis    Carpal tunnel syndrome  Cerebrovascular disease 01/02/2017   Duodenal diverticulum    Elevated uric acid in blood    Esophageal motility disorder    Fatty liver    GERD (gastroesophageal reflux disease)    Hyperlipidemia    Hypertension    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   Sleep apnea    Tubulovillous adenoma of colon    Type II  diabetes mellitus       Health Maintenance  Topic Date Due   Zoster Vaccines- Shingrix (1 of 2) Never done   COVID-19 Vaccine (4 - Booster for Pfizer series) 05/03/2020   INFLUENZA VACCINE  01/01/2021   FOOT EXAM  02/26/2021   HEMOGLOBIN A1C  06/30/2021   OPHTHALMOLOGY EXAM  11/29/2021   COLONOSCOPY ( 02/11/2023   TETANUS/TDAP  03/26/2023   Pneumonia Vaccine  Completed   DEXA SCAN  Completed   Hepatitis C Screening  Completed   HPV VACCINES  Aged Out     Immunization History  Administered Date(s) Administered   Influenza Split 03/25/2013   Influenza, High Dose  04/21/2017, 02/18/2018, 03/11/2019, 03/20/2020   PFIZER  SARS-COV-2 Vacc 06/24/2019, 07/15/2019, 03/08/2020   Pneumococcal-13  01/26/2014   Pneumococcal-23 03/14/2011   Td 03/25/2013   Zoster, Live 06/03/2005    Last Colon - 09/22/2012 - Dr Delfin Edis - Dx Lymphocytic colitis                     - 02/10/2018 - Colonoscopy - Dr Henrene Pastor - Recc 5 yr f/u due Sept 2024   Last MGM - 12/15/2020   Past Surgical History:  Procedure Laterality Date   APPENDECTOMY     BREAST BIOPSY Right    CARPAL TUNNEL RELEASE Right 1992   cataract extration     COLONOSCOPY     CYST REMOVAL HAND     TONSILLECTOMY  1952     Family History  Problem Relation Age of Onset   Heart disease Mother    Diabetes Mother    Colon polyps Mother    Heart disease Father    Diabetes Father    Heart attack Father    Heart disease Brother    Heart attack Brother    Cerebral aneurysm Sister    Heart disease Brother        Stent   Colon cancer Neg Hx    Esophageal cancer Neg Hx    Stomach cancer Neg Hx    Rectal cancer Neg Hx    Breast cancer Neg Hx      Social History   Tobacco Use   Smoking status: Never   Smokeless tobacco: Never  Vaping Use   Vaping Use: Never used  Substance Use Topics   Alcohol use: No   Drug use: No      ROS Constitutional: Denies fever, chills, weight loss/gain, headaches, insomnia,  night sweats, and change in appetite. Does c/o fatigue. Eyes: Denies redness, blurred vision, diplopia, discharge, itchy, watery eyes.  ENT: Denies discharge, congestion, post nasal drip, epistaxis, sore throat, earache, hearing loss, dental pain, Tinnitus, Vertigo, Sinus pain, snoring.  Cardio: Denies chest pain, palpitations, irregular heartbeat, syncope, dyspnea, diaphoresis, orthopnea, PND, claudication, edema Respiratory: denies cough, dyspnea, DOE, pleurisy, hoarseness, laryngitis, wheezing.  Gastrointestinal: Denies dysphagia, heartburn, reflux, water brash, pain, cramps, nausea, vomiting, bloating, diarrhea, constipation, hematemesis, melena, hematochezia, jaundice, hemorrhoids Genitourinary: Denies dysuria,  frequency, urgency, nocturia, hesitancy, discharge, hematuria, flank pain Breast: Breast lumps, nipple discharge, bleeding.  Musculoskeletal: Denies arthralgia, myalgia, stiffness, Jt. Swelling, pain, limp, and  strain/sprain. Denies falls. Skin: Denies puritis, rash, hives, warts, acne, eczema, changing in skin lesion Neuro: No weakness, tremor, incoordination, spasms, paresthesia, pain Psychiatric: Denies confusion, memory loss, sensory loss. Denies Depression. Endocrine: Denies change in weight, skin, hair change, nocturia, and paresthesia, diabetic polys, visual blurring, hyper / hypo glycemic episodes.  Heme/Lymph: No excessive bleeding, bruising, enlarged lymph nodes.  Physical Exam  BP 138/82   Pulse 68   Temp 97.6 F (36.4 C)   Resp 16   Ht 5\' 3"  (1.6 m)   Wt 153 lb 9.6 oz (69.7 kg)   SpO2 98%   BMI 27.21 kg/m   General Appearance: Well nourished, well groomed and in no apparent distress.  Eyes: PERRLA, EOMs, conjunctiva no swelling or erythema, normal fundi and vessels. Sinuses: No frontal/maxillary tenderness ENT/Mouth: EACs patent / TMs  nl. Nares clear without erythema, swelling, mucoid exudates. Oral hygiene is good. No erythema, swelling, or exudate. Tongue normal, non-obstructing. Tonsils not swollen or erythematous. Hearing normal.  Neck: Supple, thyroid not palpable. No bruits, nodes or JVD. Respiratory: Respiratory effort normal.  BS equal and clear bilateral without rales, rhonci, wheezing or stridor. Cardio: Heart sounds are normal with regular rate and rhythm and no murmurs, rubs or gallops. Peripheral pulses are normal and equal bilaterally without edema. No aortic or femoral bruits. Chest: symmetric with normal excursions and percussion. Breasts: Symmetric, without lumps, nipple discharge, retractions, or fibrocystic changes.  Abdomen: Flat, soft with bowel sounds active. Nontender, no guarding, rebound, hernias, masses, or organomegaly.  Lymphatics: Non tender  without lymphadenopathy.  Genitourinary:  Musculoskeletal: Full ROM all peripheral extremities, joint stability, 5/5 strength, and normal gait. Skin: Warm and dry without rashes, lesions, cyanosis, clubbing or  ecchymosis.  Neuro: Cranial nerves intact, reflexes equal bilaterally. Normal muscle tone, no cerebellar symptoms. Sensation intact.  Pysch: Alert and oriented X 3, normal affect, Insight and Judgment appropriate.    Assessment and Plan  1. Annual Preventative Screening Examination   2. Essential hypertension  - EKG 12-Lead - Urinalysis, Routine w reflex microscopic - Microalbumin / creatinine urine ratio - CBC with Differential/Platelet - COMPLETE METABOLIC PANEL WITH GFR - Magnesium - TSH  3. Hyperlipidemia associated with type 2 diabetes mellitus ([place])  - EKG 12-Lead - Lipid panel - TSH  4. Type 2 diabetes mellitus with stage 2 chronic kidney  disease, without long-term current use of insulin (HCC)  - EKG 12-Lead - HM DIABETES FOOT EXAM - LOW EXTREMITY NEUR EXAM DOCUM - Hemoglobin A1c - Insulin, random  5. Vitamin D deficiency  - VITAMIN D 25 Hydroxy (Vit-D Deficiency, Fractures)  6. Idiopathic gout  - Uric acid  7. Depression, major, recurrent, in partial remission (HCC)  - TSH  8. Gastroesophageal reflux disease  - CBC with Differential/Platelet  9. Screening for colorectal cancer  - POC Hemoccult Bld/Stl  10. Sleep apnea, unspecified type   11. Screening for ischemic heart disease  - EKG 12-Lead  12. Medication management  - Urinalysis, Routine w reflex microscopic - Microalbumin / creatinine urine ratio - CBC with Differential/Platelet - COMPLETE METABOLIC PANEL WITH GFR - Magnesium - Lipid panel - TSH - Hemoglobin A1c - Insulin, random - VITAMIN D 25 Hydroxy           Patient was counseled in prudent diet to achieve/maintain BMI less than 25 for weight control, BP monitoring, regular exercise and medications. Discussed  med's effects and SE's. Screening labs and tests as requested with regular follow-up as recommended. Over 40 minutes  of exam, counseling, chart review and high complex critical decision making was performed.   Kirtland Bouchard, MD

## 2021-03-29 NOTE — Patient Instructions (Signed)

## 2021-03-30 ENCOUNTER — Other Ambulatory Visit: Payer: Self-pay

## 2021-03-30 ENCOUNTER — Encounter: Payer: Self-pay | Admitting: Internal Medicine

## 2021-03-30 ENCOUNTER — Ambulatory Visit (INDEPENDENT_AMBULATORY_CARE_PROVIDER_SITE_OTHER): Payer: HMO | Admitting: Internal Medicine

## 2021-03-30 VITALS — BP 138/82 | HR 68 | Temp 97.6°F | Resp 16 | Ht 63.0 in | Wt 153.6 lb

## 2021-03-30 DIAGNOSIS — Z1211 Encounter for screening for malignant neoplasm of colon: Secondary | ICD-10-CM

## 2021-03-30 DIAGNOSIS — E785 Hyperlipidemia, unspecified: Secondary | ICD-10-CM

## 2021-03-30 DIAGNOSIS — Z23 Encounter for immunization: Secondary | ICD-10-CM | POA: Diagnosis not present

## 2021-03-30 DIAGNOSIS — K219 Gastro-esophageal reflux disease without esophagitis: Secondary | ICD-10-CM | POA: Diagnosis not present

## 2021-03-30 DIAGNOSIS — Z136 Encounter for screening for cardiovascular disorders: Secondary | ICD-10-CM | POA: Diagnosis not present

## 2021-03-30 DIAGNOSIS — Z0001 Encounter for general adult medical examination with abnormal findings: Secondary | ICD-10-CM

## 2021-03-30 DIAGNOSIS — N182 Chronic kidney disease, stage 2 (mild): Secondary | ICD-10-CM

## 2021-03-30 DIAGNOSIS — G4730 Sleep apnea, unspecified: Secondary | ICD-10-CM

## 2021-03-30 DIAGNOSIS — Z1212 Encounter for screening for malignant neoplasm of rectum: Secondary | ICD-10-CM

## 2021-03-30 DIAGNOSIS — M1000 Idiopathic gout, unspecified site: Secondary | ICD-10-CM

## 2021-03-30 DIAGNOSIS — Z0000 Encounter for general adult medical examination without abnormal findings: Secondary | ICD-10-CM

## 2021-03-30 DIAGNOSIS — E559 Vitamin D deficiency, unspecified: Secondary | ICD-10-CM | POA: Diagnosis not present

## 2021-03-30 DIAGNOSIS — F3341 Major depressive disorder, recurrent, in partial remission: Secondary | ICD-10-CM | POA: Diagnosis not present

## 2021-03-30 DIAGNOSIS — E1169 Type 2 diabetes mellitus with other specified complication: Secondary | ICD-10-CM | POA: Diagnosis not present

## 2021-03-30 DIAGNOSIS — E1122 Type 2 diabetes mellitus with diabetic chronic kidney disease: Secondary | ICD-10-CM

## 2021-03-30 DIAGNOSIS — I10 Essential (primary) hypertension: Secondary | ICD-10-CM | POA: Diagnosis not present

## 2021-03-30 DIAGNOSIS — Z79899 Other long term (current) drug therapy: Secondary | ICD-10-CM | POA: Diagnosis not present

## 2021-03-31 NOTE — Progress Notes (Signed)
============================================================ -   Test results slightly outside the reference range are not unusual. If there is anything important, I will review this with you,  otherwise it is considered normal test values.  If you have further questions,  please do not hesitate to contact me at the office or via My Chart.  ============================================================ ============================================================  -  Uric Acid / Gout test is Normal - Please continue Allopurinol  ============================================================ ============================================================  - Glucose = 162 mg% - very high - Ideal or Goal is less than 100.   - And A1c = 5.8% - also too high .    300% increased risk for heart attack, stroke, cancer and   alzheimer- type vascular dementia as full blown diabetes.   But the good news is that diet, exercise with  weight loss can cure the early diabetes at this point. ============================================================ ============================================================  -  Total Chol = 174   - Excellent   - Bad LDL  Chol is 94   - very good   - Please continue rosuvastatin same  ============================================================ ============================================================  - Vitamin D = 82  - Excellent  ============================================================ ============================================================  - All Else - CBC - Kidneys - Electrolytes - Liver - Magnesium & Thyroid    - all  Normal / OK ============================================================ ============================================================

## 2021-03-31 NOTE — Progress Notes (Signed)
============================================================ -   Uric Acid / Gout test is Normal - Please continue Allopurinol  ============================================================ - Glucose = 162 mg% - very high - Ideal or Goal is less than 100.   - And A1c = 5.8% - also too high .  300% increased risk for heart attack, stroke, cancer and  alzheimer- type vascular dementia as full blown diabetes.  But the good news is that diet, exercise with  weight loss can cure the early diabetes at this point. ============================================================ - Total Chol = 174  - Excellent  - Bad LDL Chol is 94  - very good  - Please continue rosuvastatin same  ============================================================ - Vitamin D = 82 - Excellent  ============================================================ - All Else - CBC - Kidneys - Electrolytes - Liver - Magnesium & Thyroid  - all Normal / OK ============================================================

## 2021-04-02 LAB — LIPID PANEL
Cholesterol: 174 mg/dL (ref <200)
HDL: 53 mg/dL (ref >=50)
LDL Cholesterol (Calc): 94 mg/dL (calc)
Non-HDL Cholesterol (Calc): 121 mg/dL (calc) (ref <130)
Total CHOL/HDL Ratio: 3.3 (calc) (ref <5.0)
Triglycerides: 176 mg/dL — ABNORMAL HIGH (ref <150)

## 2021-04-02 LAB — URINALYSIS, ROUTINE W REFLEX MICROSCOPIC
Bilirubin Urine: NEGATIVE
Glucose, UA: NEGATIVE
Hgb urine dipstick: NEGATIVE
Ketones, ur: NEGATIVE
Leukocytes,Ua: NEGATIVE
Nitrite: NEGATIVE
Protein, ur: NEGATIVE
Specific Gravity, Urine: 1.008 (ref 1.001–1.035)
pH: 7 (ref 5.0–8.0)

## 2021-04-02 LAB — COMPLETE METABOLIC PANEL WITH GFR
AG Ratio: 2 (calc) (ref 1.0–2.5)
ALT: 34 U/L — ABNORMAL HIGH (ref 6–29)
AST: 31 U/L (ref 10–35)
Albumin: 4.7 g/dL (ref 3.6–5.1)
Alkaline phosphatase (APISO): 74 U/L (ref 37–153)
BUN/Creatinine Ratio: 16 (calc) (ref 6–22)
BUN: 16 mg/dL (ref 7–25)
CO2: 26 mmol/L (ref 20–32)
Calcium: 10.3 mg/dL (ref 8.6–10.4)
Chloride: 104 mmol/L (ref 98–110)
Creat: 1.01 mg/dL — ABNORMAL HIGH (ref 0.60–1.00)
Globulin: 2.4 g/dL (calc) (ref 1.9–3.7)
Glucose, Bld: 162 mg/dL — ABNORMAL HIGH (ref 65–99)
Potassium: 4.6 mmol/L (ref 3.5–5.3)
Sodium: 140 mmol/L (ref 135–146)
Total Bilirubin: 0.9 mg/dL (ref 0.2–1.2)
Total Protein: 7.1 g/dL (ref 6.1–8.1)
eGFR: 58 mL/min/{1.73_m2} — ABNORMAL LOW (ref >=60)

## 2021-04-02 LAB — MICROALBUMIN / CREATININE URINE RATIO
Creatinine, Urine: 37 mg/dL (ref 20–275)
Microalb Creat Ratio: 16 mcg/mg creat (ref <30)
Microalb, Ur: 0.6 mg/dL

## 2021-04-02 LAB — URIC ACID: Uric Acid, Serum: 5.8 mg/dL (ref 2.5–7.0)

## 2021-04-02 LAB — CBC WITH DIFFERENTIAL/PLATELET
Absolute Monocytes: 535 cells/uL (ref 200–950)
Basophils Absolute: 32 cells/uL (ref 0–200)
Basophils Relative: 0.6 %
Eosinophils Absolute: 162 cells/uL (ref 15–500)
Eosinophils Relative: 3 %
HCT: 40.1 % (ref 35.0–45.0)
Hemoglobin: 13.1 g/dL (ref 11.7–15.5)
Lymphs Abs: 1625 cells/uL (ref 850–3900)
MCH: 27.3 pg (ref 27.0–33.0)
MCHC: 32.7 g/dL (ref 32.0–36.0)
MCV: 83.5 fL (ref 80.0–100.0)
MPV: 9.2 fL (ref 7.5–12.5)
Monocytes Relative: 9.9 %
Neutro Abs: 3046 cells/uL (ref 1500–7800)
Neutrophils Relative %: 56.4 %
Platelets: 202 10*3/uL (ref 140–400)
RBC: 4.8 10*6/uL (ref 3.80–5.10)
RDW: 14.4 % (ref 11.0–15.0)
Total Lymphocyte: 30.1 %
WBC: 5.4 10*3/uL (ref 3.8–10.8)

## 2021-04-02 LAB — HEMOGLOBIN A1C
Hgb A1c MFr Bld: 5.8 % of total Hgb — ABNORMAL HIGH (ref <5.7)
Mean Plasma Glucose: 120 mg/dL
eAG (mmol/L): 6.6 mmol/L

## 2021-04-02 LAB — TSH: TSH: 1.14 mIU/L (ref 0.40–4.50)

## 2021-04-02 LAB — INSULIN, RANDOM: Insulin: 39.2 u[IU]/mL — ABNORMAL HIGH

## 2021-04-02 LAB — VITAMIN D 25 HYDROXY (VIT D DEFICIENCY, FRACTURES): Vit D, 25-Hydroxy: 82 ng/mL (ref 30–100)

## 2021-04-02 LAB — MAGNESIUM: Magnesium: 2.2 mg/dL (ref 1.5–2.5)

## 2021-04-02 NOTE — Progress Notes (Signed)
Patient Visit with Chart Prep KURSTIE, PHILSON [id number] 75 years, Female  DOB: November 11, 1945  M: [phone] Care Team: Lavona Mound, Trader  __________________________________________________ Summary: Pt is a pleasant 75 year old woman who presents with husband. Pt weight today is 156.4 lbs. Pt chief complaint is overactive bladder and desires to stop taking some OTC medicines  Recommendations/Changes made from today's visit: Pt had been drinking water right before bed. Recommended pt not take any fluids at least 2-3 hours before bedtime BP was high during visit today: 158/82, pulse 58. Encouraged pt to take atenolol every day, and informed pt she would be considered for BP RPM when reenrollment begins  Plan: Will enroll patient in [facility] Restart Atenolol 50mg  1 tablet daily, check BP and pulse every day for 1 week. Will assess BP and if >140/80 and pulse >70 will increase to 2 tabs QD. (Update 04/11/2021: Pt BP was as follows: 11/9 - 116/67 HR 66, 11/8 - 123/69  HR 71, 11/7-  134/70 HR 55, 11/6 - 131/70 HR 65, 11/5 - 147/78 HR 68, 11/4 -  166/86 HR 62, 11/3 -  147/75 HR 56). Informed pt to continue taking one tablet of atenolol daily and we will continue following up with BP)  Patient scheduled for CCM visit with the clinical pharmacist.  Patient is referred for CCM by their PCP and CPP is under general PCP supervision.: At least 2 of these conditions are expected to last 12 months or longer and patient is at significant risk for acute exacerbations and/or functional decline.  Patient has consented to participation in CCM program. Visit Type: Clinic visit Date of Upcoming Visit: 04/03/2021  Patient Chart Prep [facility]) Chronic Conditions Patient's Chronic Conditions: Hypertension (HTN), Other, Gastroesophageal Reflux Disease (GERD), Hyperlipidemia/Dyslipidemia (HLD), Chronic Kidney Disease (CKD), Diabetes (DM), Osteopenia or Osteoporosis, Depression, Anxiety,  Chronic Pain, Cardiovascular Disease (CVD), Overactive Bladder (OAB) List Other Conditions (separated by comma): Sleep Apnea, Vitamin D deficiency  Doctor and Hospital Visits Were there PCP Visits in last 6 months?: Yes Visit #1: 03/30/2021- Dr. Oneta Rack- Patient was seen for a physical exam. Stopped Methylprednisolone 4mg . Labs ordered.  Visit #2: 12/28/2020- Dr. Oneta Rack- Patient was seen for Medicare annual wellness exam. No medication changes. Labs ordered.  Were there Specialist Visits in last 6 months?: Yes Visit #1: 12/12/2020- [name], NP (neurology)- Patient was seen for a follow up. No medication changes.  Was there a Hospital Visit in last 30 days?: No Were there other Hospital Visits in last 6 months?: No  Medication Information Are there any Medication discrepancies?: No Are there any Medication adherence gaps (beyond 5 days past due)?: No Medication adherence rates for the STAR rating drugs: rosuvastatin (CRESTOR) 20 MG tablet 90 tablet 0 02/02/2021   Sig: TAKE 1 TABLET BY MOUTH ONCE DAILY FOR CHOLESTEROL   olmesartan (BENICAR) 40 MG tablet 90 tablet 3 11/08/2020   Sig: Take  1 tablet   Daily for BP    List Patient's current Care Gaps: No current Care Gaps identified  Pre-Call Questions [facility]) Are you able to connect with Patient: Yes Visit Type: Clinic visit Confirm visit date/time: 04/03/2021 1:30pm Have you seen any other providers since your last visit?: No Any changes in your medicines or health?: No Any side effects from any medications?: No Do you have any symptoms or problems not managed by your medications?: No What are your top 2 health concerns right now?: Problems with back, hips, and shoulder- was told to relax  more What are your top 2 medication concerns right now?: Other Details: None Has your Dr asked that you take BP, BG or special diet at home?: Monitor BP, Monitor BG, Exercise Do you get any type of exercise on a regular basis?: Yes What type of  exercise and how often?: Yard work and walking What are your top 1 or 2 goals for your health?: Relax more What are you doing to try to reach these goals?: Exercise, Monitoring BP, Monitoring BG, Taking medications What, if any, problems do you have getting your medications from the pharmacy?: None Is there anything else that you would like to discuss during the appointment?: No  Disease Assessments Subjective Information Current BP: 138/82 Current HR: 68 taken on: 03/30/2021 Previous BP: 137/81 Previous HR: 70 taken on: 12/28/2020 Weight: 153 BMI: 27.21 Last GFR: 58 taken on: 03/30/2021 Why did the patient present?: CCM initial visit Marital status?: Married Details: Married to [place], lives together Retired? Previous work?: Pt is retired, currently involved in [employer], grows produce such as tobacco, corn, green beans, etc. What does the patient do during the day?: Yard work with husband all day, plays volleyball with church members at [employer] every Monday.  Who does the patient spend their time with and what do they do?: Spends most of her time with husband. They do yard work together every day. Lifestyle habits such as diet and exercise?: Exercise: Majority of exercise habits consists of yard work and work throughout [employer]. Patient and husband also work on their neighbors yard by [employer] for 6-8 hours of the day.  Alcohol, tobacco, and illicit drug usage?: None What is the patient's sleep pattern?: Trouble staying asleep, Waking up too early in the morning How many hours per night does patient typically sleep?: 4 hours Patient pleased with health care they are receiving?: Yes Family, occupational, and living circumstances relevant to overall health?: Patient has a son and brother in law that lives nearby. Overall, patient has a great support system and lives an active life Factors that may affect medication adherence?: Transportation Name and location of  Current pharmacy: [facility] in [place] Current Rx insurance plan: HTA Are meds synced by current pharmacy?: Yes Are meds delivered by current pharmacy?: No - delivery not available Would patient benefit from direct intervention of clinical lead in dispensing process to optimize clinical outcomes?: Yes Are [facility] services available where patient lives?: Yes Is patient disadvantaged to use [facility]?: No [facility] services reviewed with patient and patient wishes to change pharmacy?: Yes - patient provided verbal consent to transfer prescriptions to [facility].  Patient made aware that CCM team is committed to working with any pharmacy and patient is free to select the pharmacy of their choice that best meets their needs.  Patient also made aware they may discontinue use of [facility] at any time if they feel the service is not meeting their personal needs. Reasons patient wishes to change to [facility]: Synchronization, Packaging, Delivery of medication Dispensing Preference: Packaging 90 DS Any additional demeanor/mood notes?: Pt is a pleasant 75 year old woman who presents with husband. Pt weight today is 156.4 lbs  Hypertension (HTN) Details Assess this condition today?: Yes Is patient able to obtain BP reading today?: Yes BP today is: 158/82 Heart Rate is: 58 Goal: <130/80 mmHG Hypertension Stage: Stage 2 (SBP >140 or DBP > 90) Is Patient checking BP at home?: Yes Pt. home BP readings are ranging: 110/60-120s,  140s a few days ago 164/80 How often does patient miss taking their blood pressure medications?: Pt does not consistently take atenolol and clonidine due to not understanding how to take Has patient experienced hypotension, dizziness, falls or bradycardia?: Yes Provide Details: Patient has experienced a few falls due to tripping over yardwork tools Check present secondary causes (below) for HTN: Sleep Apnea, CKD BP RPM  device: Does patient qualify?: Yes We discussed: Proper Home BP Measurement, Getting enough potassium in your diet equaling 3500-5000mg /day.  This helps to regulate BP by balancing out the effects of salt., Reducing the amount of salt intake to 1500mg /per day., DASH diet:  following a diet emphasizing fruits and vegetables and low-fat dairy products along with whole grains, fish, poultry, and nuts. Reducing red meats and sugars. Assessment:: Uncontrolled Drug: Atenolol 50mg  1-2 tabs daily Assessment: Appropriate, Query Effectiveness Drug: Minoxidil 10mg  1/2 to 1 tab daily Assessment: Query need Drug: Olmesartan 40mg  QD Assessment: Appropriate, Query Effectiveness Drug: Clonidine 0.2mg  TID PRN if systolic BP > 160 Assessment: Appropriate, Query Effectiveness Drug: Verapamil SR 240mg  QD Assessment: Appropriate, Query Effectiveness Plan to Start: Take Atenolol 1 tablet every day for 1 week. Will reassess HR and BP and determine if pt should increase to 2 tabs daily Plan to Review: Home BP measurements in 1 week HC Follow up: BP assessment in 1 month Pharmacist Follow up: Call Pt in 1 week and reassess BP readings  Hyperlipidemia/Dyslipidemia (HLD) Last Lipid panel on: 03/30/2021 TC (Goal<200): 174 LDL: 94 HDL (Goal>40): 53 TG (Goal<150): 176 ASCVD 10-year risk?is:: High (>20%) ASCVD Risk Score: 40.9% Assess this condition today?: Yes LDL Goal: <70 Has patient tried and failed any HLD Medications?: Yes Medications failed: atorvastatin atorvastatin: Elevated LFTs Check present secondary causes (below) that can lead to increased cholesterol levels (multi-choice optional): Beta blockers, Diabetes, CKD We discussed: How to reduce cholesterol through diet/weight management and physical activity., How a diet high in plant sterols (fruits/vegetables/nuts/whole grains/legumes) may reduce your cholesterol., Encouraged increasing fiber to a daily intake of 10-25g/day Assessment::  Uncontrolled Drug: Rosuvastatin 20mg  QD Assessment: Appropriate, Query Effectiveness Additional Info: Pt admitted to not fasting when her las labs were drawn which could cause a falsely elevated LDL and TG level.  Plan to Review: Lipid panel in 3 months Plan to Counsel: Ensure patient always fasts during labs [facility] Follow up: N/A Pharmacist Follow up: Review Lipids in February  Diabetes (DM) Current A1C: 5.8 taken on: 03/30/2021 Previous A1C: 5.9 taken on: 12/28/2020 Type: 2 The current microalbumin ratio is: 16 tested on: 03/30/2021 Last DM Eye Exam on: 07/03/2016 Assess this condition today?: Yes Goal A1C: < 7.0 % Type: 2 Is Patient taking statin medication?: Yes Is patient taking ACEi / ARB?: Yes DM RPM device: Does patient qualify?: Yes Patient checking Blood Sugar at home?: Yes Does patient use a Continuous Glucose Monitoring (CGM) device?: No How often does patient check Blood Sugars?: Daily Recent fasting Blood sugar reading: always less than 120. Yesterday was 109. A few times it gets as low as 80-85 Has patient experienced any hypoglycemic episodes?: Yes Details: Pt has not had true hypoglycemia, but feel symptoms of shakiness at blood sugars in the 80s Diet recall discussed: No We discussed: How to recognize and treat signs of hypoglycemia., Diet and exercise extensively, Low carbohydrate eating plan with an emphasis on whole grains, legumes, nuts, fruits, and vegetables and minimal refined and processed foods. Assessment:: Controlled Drug: Metformin 500mg  2 tabs BID Assessment: Appropriate, Effective, Safe, Accessible HC Follow  up: N/A Pharmacist Follow up: Assess A1c at next visit  Anxiety Previous GAD-7 Score: N/A Assess this condition today?: Yes Completing the GAD-7 Questionnaire today?: No In your opinion, how do you feel your anxiety symptoms have been controlled over the past 3 months?: Improved Have you ever had a panic attack?: No Assessment::  Controlled Drug: N/A Assessment: Appropriate, Effective, Safe, Accessible Additional Info: Pt states she is not suffering from anxiousness at this time. She said it all started a few years ago in 2007-2008 when a total of 10 family members died. Father had a heart attack, sister died of an aneurysm, a son in law died and another family member also died of a heart attack. She did not look into counseling at that time and she feels like she has coped but she still thinks about it here and there. She has a good support system and does not have any concerns about anxiety at this time. HC Follow up: N/A Pharmacist Follow up: N/A  Depression Previous PHQ-9 Score: N/A Assess this condition today?: Yes Completing the PHQ-9 Questionnaire today?: No In your opinion, how do you feel your depression symptoms have been controlled over the past 3 months?: Stable / stayed the same Assessment:: Controlled Drug: Sertraline 50mg  QD Assessment: Appropriate, Effective, Safe, Accessible Additional Info: Pt does not feel like the medication is really helping at all with her symptoms but does feel like it helps at night with her sleep. Overall she states her depression is controlled and she has a good support system HC Follow up: N/A Pharmacist Follow up: Reassess at next visit  GERD Assess this condition today?: No Chronic kidney disease (CKD) (Task 8 of 16) Completed by Charlett Nose on 04/04/2021 01:59 PM Details Previous GFR: 58 taken on: 03/30/2021 The current microalbumin ratio is: 16 tested on: 03/30/2021 Assess this condition today?: Yes CKD Stage: Stage 3a (GFR 45-60 mL/min) Albuminuria Stage: A1 (<30) Contributing factors for developing CKD: Diabetes, HTN Is Patient taking statin medication: Yes Is patient taking ACEi / ARB?: Yes Renal dose adjustments recommended?: No We discussed: Limiting dietary sodium intake to less than 2000 mg / day, Maintaining blood pressure control, Maintaining  blood glucose control, Avoidance of nephrotoxic drugs (NSAIDs) Assessment:: Controlled Drug: N/A Assessment: Appropriate, Effective, Safe, Accessible HC Follow up: N/A Pharmacist Follow up: Reassess eGFR at next visit  Cardiovascular Disease (CVD) Assess this condition today?: Yes Is Patient taking statin medication?: Yes Is patient currently Smoking or Vaping?: No Did patient smoke/vape before?: No Is patient currently on antiplatelet therapy?: Yes Is patient experiencing any abnormal bruising or bleeding?: No Patient has the following: Cerebrovascular Disease (CVD) CVD: Prior events / interventions (include dates if available): The patient was noted to have what appeared to be chronic small vessel ischemic changes that included the right pons and middle cerebellar peduncles bilaterally. MRA of the head was unremarkable, and a 2-D echocardiogram was unremarkable. (2017) Has patient had a carotid ultrasound?: No We discussed: Warning signs of stroke, Importance of controlling other comorbid conditions Assessment:: Controlled Drug: Aspirin 325mg  QD Assessment: Appropriate, Effective, Query Safety Additional Info: Pt is taking Aspirin for heart protection and for pain which is why she uses 325mg  dose HC Follow up: N/A Pharmacist Follow up: Assess symptoms of stroke, BP and cholesterol control at next visit  Osteopenia or Osteoporosis Current T-score: Spine  -0.8  DualFemur Neck  -1.2   DualFemur Total Mean   -0.4 taken on: 12/13/2020 Previous T-score: DualFemur Neck Right  -1.2  AP Spine  L1-L4   -0.4   DualFemur Total Mean   -0.3 taken on: 01/06/2018 Current Vitamin D 25-OH: 82 taken on: 03/30/2021 Previous Vitamin D 25-OH: 103 taken on: 09/28/2020 Assess this condition today?: Yes Patient has: Osteopenia In the past 12 months, have you fallen?: Yes What were the circumstances?: Tripping outside over wood, rakes, etc How many times have you fallen?: 2-3 Are there any stairs in  or around the home?: No Is the home free of loose throw rugs in walkways, pet beds, electrical cords, etc.?: Yes Is there adequate lighting in your home to reduce the risk of falls?: Yes FRAX score - Hip (if not on treatment): 10.3% FRAX score - Major Fracture (if not on treatment): 1.8% Dietary calcium intake: 1200 mg QD We discussed: Weight bearing exercises (walking, light weights, resistance training), Dietary calcium intake, Fall prevention Assessment:: Controlled Drug: Vitamin D 78295 IU 2 times a week Assessment: Appropriate, Effective, Safe, Accessible Drug: Calcium 1200mg  QD Assessment: Appropriate, Effective, Safe, Accessible HC Follow up: Fall assessment February Pharmacist Follow up: N/A  Overactive Bladder (OAB) Assess this condition today?: Yes Patient is experiencing follow symptoms: Urgency How often are you waking at night to urinate?: 1 to 2 times a night What dietary modifications have you made to help with your overactive bladder?: Pt stated that she usually has a glass of water right before bed. Pt recently started Oxybutynin which has helped more but still has some urinary frequency. 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 Prior surgeries / interventions and response: N/A How would you rate your pain without medications (scale of 1-10)?: 9 How would you rate your pain with medications (scale of 1-10)?: 2-3 What time of day is your pain at it's worst?: Morning What makes your pain better?: Walking around/activity or sitting  down What makes your pain worse?: right when patient stands up she she feels pain/ switches positions Any issues with constipation related to your medications?: No Assessment:: Controlled Drug: Tylenol 500mg  TID PRN Assessment: Appropriate, Effective, Safe, Accessible Drug: Aspirin 325 QD Assessment: Appropriate, Effective, Safe, Accessible HC Follow up: N/A Pharmacist Follow up: Follow-up with pain control. Pt will follow up with pain specialist if warranted  Exercise, Diet and Non-Drug Coordination Needs Additional exercise counseling points. We discussed: targeting at least 150 minutes per week of moderate-intensity aerobic exercise., incorporating flexibility, balance, and strength training exercises Additional diet counseling points. We discussed: key components of a low-carb eating plan, aiming to consume at least 8 cups of water day Discussed Non-Drug Care Coordination Needs: Yes Does Patient have Medication financial barriers?: No Accountable Health Communities Health-Related Social Needs Screening Tool -  SDOH  ([employer]) What is your living situation today? (ref #1): I have a steady place to live Think about the place you live. Do you have problems with any of the following? (ref #2): None of the above Within the past 12 months, you worried that your food would run out before you got money to buy more (ref #3): Never true Within the past 12 months, the food you bought just didn't last and  you didn't have money to get more (ref #4): Never true In the past 12 months, has lack of reliable transportation kept you from medical appointments, meetings, work or from getting things needed for daily living? (ref #5): No In the past 12 months, has the electric, gas, oil, or water company threatened to shut off services in your home? (ref #6): No How often does anyone, including family and friends, physically hurt you? (ref #7):  Never (1) How often does anyone, including family and friends, insult or talk down to you? (ref #8): Never (1) How often does anyone, including friends and family, threaten you with harm? (ref #9): Never (1) How often does anyone, including family and friends, scream or curse at you? (ref #10): Never (1)  Charlett Nose on 04/04/2021 03:41 PM CPP Chart Review: 30 min  CPP Office Visit: 44 min  CPP Office Visit Documentation: 58 min  CPP Coordination of Care: 5 min [facility] Care Plan Completion: 15 min CPP Care Plan Review: 15 min Engagement Notes Charlett Nose on 04/04/2021 04:02 PM HC F/u: -[facility] On[facility] -BP Assessment in December -Overactive bladder assessment January (Mainly I would like to know if pt symptoms are more controlled now that she is not drinking water at night and taking oxybutynin) -Fall assessment February   CPP F/u: - Call pt in 1 week (04/10/21) to assess BP readings, if above 140/80 and HR &gt; 60, increase atenolol to 2 tabs QD -12/18/2021 - MD visit -01/08/2022 - follow up visit (phone) - review lipid panel, A1c, BP, falls, etc. -04/01/2022 - MD visit (physical)  CARE PLAN  Patient Name: Mairead Albo Klimas DOB:??26-Feb-1946  Last Care Plan Update:?04/03/2021  COMPREHENSIVE CARE PLAN AND GOALS?   HYPERTENSION?   MOST RECENT BLOOD PRESSURE:? Current BP: 138/82  Current HR: 68  taken on: 03/30/2021  Previous BP: 137/81  Previous HR: 70  taken on: 12/28/2020   CURRENT BLOOD PRESSURE:  BP today is: 158/82  Heart Rate is: 58   MY GOAL BLOOD PRESSURE:?<130/80  CURRENT MEDICATION AND DOSING:?   Atenolol 50mg  1-2 tabs daily  Minoxidil 10mg  1/2 to 1 tab daily  Olmesartan 40mg  daily  Clonidine 0.2mg  three times a day as needed if systolic BP > 160  Verapamil SR 240mg  daily   THE GOALS WE HAVE CHOSEN ARE:??   Targeting  150 minutes of aerobic activity per week, Reducing the amount of salt intake to 1500mg /per day., Weight reduction- We discussed losing 5-10% of body  weight., Proper Home BP Measurement  Maintain an at goal blood pressure?   PLAN TO WORK ON THESE GOALS:?   Start taking Atenolol 50mg  1 tablet every day  Check your blood pressure every day and record it in your log. We will review your blood pressure and if it is greater than 140/80 and your pulse is greater than 70, we will increase atenolol to 2 tablets daily  Take medications regularly??  Check BP at home?every day and keep a log. Bring to your doctor and pharmacist appointments  Reduce salt intake (< 1500mg / day)?  Diet: DASH diet (Choose fruits, vegetables, and low-fat dairy products. Increase whole grains, fish, poultry, nuts. Reduce red meats and sugars)?  Weight: 1 kg = ~1 mmHg reduction?  Exercise?    CHOLESTEROL?   MOST RECENT LABS:????03/30/2021  TOTAL CHOLESTEROL:?174  TRIGLYCERIDES:?176  HDL:?53  LDL:?94  CURRENT MEDICATION AND DOSING:?  Rosuvastatin 20mg  daily   THE GOALS WE HAVE CHOSEN ARE:??   Total Cholesterol goal under 200, Triglycerides  goal under 150, HDL goal above 40, LDL goal under 70  BARRIERS TO ACHIEVING GOALS:?  Not fasting during last lab draw, which could cause a falsely elevated LDL and Triglyceride level.   PLAN TO WORK ON THESE GOALS:?   Take medication regularly?  Diet: high in plant sterols (fruits/ vegetables/ nuts/ whole grains/ legumes). Increase fiber intake (10-25g/day). Avoid foods high in cholesterol (red meat, egg yolks, dairy, oils/ butter). Choose low-fat options.?  Exercise?  Weight Management?    DIABETES?   MOST RECENT A1C:???? Current A1C: 5.8  taken on: 03/30/2021   MY GOAL A1C:?<7%  LAST FOOT EXAM:?  LAST EYE EXAM:?07/03/2016  CURRENT MEDICATION AND DOSING:?  Metformin 500mg  2 tabs 2 times a day   THE GOALS WE HAVE CHOSEN ARE:??   Maintain an at goal A1C level?   PLAN TO WORK ON THESE GOALS:?   -Take medications as prescribed?  -Check blood glucose daily?   -Diet: natural carbs and sugars (vegetables, fruits, whole  grains, legumes, dairy), avoid alcohol, reduce carbohydrate intake and processed sugars. Maintain a low carbohydrate diet, eating 45 grams of carbohydrates per meal (3 servings of carbohydrates per meal), 15 grams of carbohydrates per snack (1 serving of carbohydrate per snack)?  -Weight Loss: waist circumference goal < 35 inches for females; < 40 inches for males?  -Exercise: 150 minutes of moderate-intensity aerobic activity per week (at least 3 days)?    -Foot care: wash, dry examine feet daily. Moisturize the top and bottom (not in between). Trim nails with nail file (no sharp edges). Wear socks and shoes. Elevate feet when sitting. Annual foot exam by podiatrist.?   Recognize sign and symptoms of hypoglycemia and understand treatment as outlined below?   -Hypoglycemia sign and symptoms: dizziness, anxiety/ irritability, shakiness, headache, diaphoresis, hunger, confusion, nausea, ataxia, tremors, palpitations/ tachycardia, blurred vision?  -Hypoglycemia Treatment: "Rule of 15": ?  (1) 15-20 grams of glucose/ simple carb (4 oz juice, 8oz milk, 4oz regular soda, 1 tablespoon sugar/ honey/ corn syrup, 3-4 glucose tabs/ 1 serving glucose gel)?  (2) Recheck BG after 15 mins. If hypoglycemia continues?  (3) Repeat steps 1&2, when BG normalizes, eat small meal or snack?     Overactive Bladder   CURRENT REGIMEN AND DOSING:   Drug: Oxybutynin XL 10mg  Daily   THE GOALS WE HAVE CHOSEN ARE:   Control symptoms of overactive bladder   PLAN TO WORK ON THESE GOALS:     Limiting fluid intake for 2-3 hours prior to bedtime       Osteopenia  CURRENT REGIMEN AND DOSING:   Vitamin D 78295 IU 2 times a week  Calcium 1200mg  daily   THE GOALS WE HAVE CHOSEN ARE:   -Reduce fall risk  -Strengthen muscles and bones   PLAN TO WORK ON THESE GOALS:     -Assess home for fall risks, use handrails on stairs and safety bars in bathroom, non-skid mats for the bath tub/ shower  -Exercise: regular weight  bearing exercises (walking, jogging) and muscle strengthening (weight training, yoga)    ? Depression?   CURRENT REGIMEN AND DOSING:?  Drug: Sertraline 50mg  every day   THE GOALS WE HAVE CHOSEN ARE:?   Lower and manage symptoms of depression?   PLAN TO WORK ON THESE GOALS:???   -Continue medication as prescribed?  -Inform practitioner of any signs and symptoms of worsening depression?  Symptoms: persistent feeling of hopelessness, dejection, constant worry, poor concentration, lack of energy, inability to sleep, sometimes suicidal tendencies,  agitation, decreased concentration, sleep changes, diminished interest/ pleasure, changes in appetite?    Chronic Kidney Disease?   Labs:?   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   -Maintaining normal BP and BG, avoid NSAIDs such as ibuprofen and naproxen  -Diet: Decrease salt and fatty foods. Increase fruit and vegetables?     Chronic Pain   CURRENT REGIMEN AND DOSING:   Tylenol 500mg  three times a day as needed  Aspirin 325 daily   THE GOALS WE HAVE CHOSEN ARE: Controlled   PLAN TO WORK ON THESE GOALS:  Follow up with pain specialist if warranted      ACTIVE MEDICATION LIST?   Your current medication list has been updated. To view, log in to your patient portal.?   Call if any changes need to be made.?    ?  MEDICATION REVIEW?  MEDICATION REVIEW CONDUCTED:?? Yes?  DATE:??? 04/03/2021 BEST POSSIBLE MEDICATION HISTORY?  SOURCE:?? Medical Records?   ?  HOW DO I? - WHEN DO I??   GET AHOLD OF MY DOCTOR DURING BUSINESS HOURS WHEN THE OFFICE IS OPEN?  PHONE NUMBER: [phone]?    AFTER HOURS [facility] NURSE WHEN THE OFFICE IS CLOSED?? PHONE: [phone]?   TALK TO MY CARE COORDINATOR??  NAME: Charlett Nose  PHONE: [phone]   EMAIL:??    [email or web address]?   CARE COORDINATOR STAFF?  Gwenevere Abbot, [address] [place], [place]?  Contact Phone Number: [phone]?      NOTE SECTION  Thank you for participating in the Chronic Care Management (CCM) program with Dr. Oneta Rack    This program takes a proactive approach to your health and my team will serve as a resource for you throughout the year. Please follow up at [phone] if you have any questions or experience changes to your overall health. Your next CCM appointment will be conducted with Charlett Nose, PharmD as follows:    Date January 08, 2022  Time  3:30 pm  Over the phone        Dortha Kern. Abran Richard, PharmD   Clinical Pharmacist   [email or web address]   [phone]

## 2021-04-03 ENCOUNTER — Ambulatory Visit: Payer: HMO | Admitting: Pharmacist

## 2021-04-03 ENCOUNTER — Other Ambulatory Visit: Payer: Self-pay

## 2021-04-03 VITALS — BP 158/82 | HR 58 | Wt 156.4 lb

## 2021-04-03 DIAGNOSIS — E663 Overweight: Secondary | ICD-10-CM

## 2021-04-03 DIAGNOSIS — E1122 Type 2 diabetes mellitus with diabetic chronic kidney disease: Secondary | ICD-10-CM

## 2021-04-03 DIAGNOSIS — F3341 Major depressive disorder, recurrent, in partial remission: Secondary | ICD-10-CM

## 2021-04-03 DIAGNOSIS — I10 Essential (primary) hypertension: Secondary | ICD-10-CM

## 2021-04-03 DIAGNOSIS — Z79899 Other long term (current) drug therapy: Secondary | ICD-10-CM

## 2021-04-03 DIAGNOSIS — N182 Chronic kidney disease, stage 2 (mild): Secondary | ICD-10-CM

## 2021-04-03 DIAGNOSIS — I679 Cerebrovascular disease, unspecified: Secondary | ICD-10-CM

## 2021-04-03 DIAGNOSIS — M8580 Other specified disorders of bone density and structure, unspecified site: Secondary | ICD-10-CM

## 2021-04-03 DIAGNOSIS — E1169 Type 2 diabetes mellitus with other specified complication: Secondary | ICD-10-CM

## 2021-04-30 ENCOUNTER — Other Ambulatory Visit: Payer: Self-pay

## 2021-04-30 DIAGNOSIS — Z1211 Encounter for screening for malignant neoplasm of colon: Secondary | ICD-10-CM

## 2021-04-30 LAB — POC HEMOCCULT BLD/STL (HOME/3-CARD/SCREEN)
Card #2 Fecal Occult Blod, POC: NEGATIVE
Card #3 Fecal Occult Blood, POC: NEGATIVE
Fecal Occult Blood, POC: NEGATIVE

## 2021-05-01 DIAGNOSIS — Z1211 Encounter for screening for malignant neoplasm of colon: Secondary | ICD-10-CM | POA: Diagnosis not present

## 2021-05-01 DIAGNOSIS — Z1212 Encounter for screening for malignant neoplasm of rectum: Secondary | ICD-10-CM | POA: Diagnosis not present

## 2021-05-02 DIAGNOSIS — E785 Hyperlipidemia, unspecified: Secondary | ICD-10-CM | POA: Diagnosis not present

## 2021-05-02 DIAGNOSIS — E1122 Type 2 diabetes mellitus with diabetic chronic kidney disease: Secondary | ICD-10-CM | POA: Diagnosis not present

## 2021-05-02 DIAGNOSIS — N182 Chronic kidney disease, stage 2 (mild): Secondary | ICD-10-CM | POA: Diagnosis not present

## 2021-05-02 DIAGNOSIS — I10 Essential (primary) hypertension: Secondary | ICD-10-CM | POA: Diagnosis not present

## 2021-05-03 ENCOUNTER — Other Ambulatory Visit: Payer: Self-pay | Admitting: Nurse Practitioner

## 2021-05-03 DIAGNOSIS — N3281 Overactive bladder: Secondary | ICD-10-CM

## 2021-05-03 DIAGNOSIS — E782 Mixed hyperlipidemia: Secondary | ICD-10-CM

## 2021-05-03 DIAGNOSIS — I10 Essential (primary) hypertension: Secondary | ICD-10-CM

## 2021-05-03 MED ORDER — VERAPAMIL HCL ER 240 MG PO TBCR
EXTENDED_RELEASE_TABLET | ORAL | 3 refills | Status: DC
Start: 2021-05-03 — End: 2022-07-18

## 2021-05-03 MED ORDER — OXYBUTYNIN CHLORIDE ER 10 MG PO TB24: ORAL_TABLET | ORAL | 0 refills | Status: DC

## 2021-05-03 MED ORDER — ROSUVASTATIN CALCIUM 20 MG PO TABS
ORAL_TABLET | ORAL | 0 refills | Status: DC
Start: 2021-05-03 — End: 2021-05-12

## 2021-05-03 MED ORDER — CLONIDINE HCL 0.2 MG PO TABS: ORAL_TABLET | ORAL | 1 refills | Status: DC

## 2021-05-12 ENCOUNTER — Other Ambulatory Visit: Payer: Self-pay | Admitting: Nurse Practitioner

## 2021-05-12 DIAGNOSIS — E782 Mixed hyperlipidemia: Secondary | ICD-10-CM

## 2021-05-12 DIAGNOSIS — N3281 Overactive bladder: Secondary | ICD-10-CM

## 2021-05-12 DIAGNOSIS — E1122 Type 2 diabetes mellitus with diabetic chronic kidney disease: Secondary | ICD-10-CM

## 2021-05-12 DIAGNOSIS — I10 Essential (primary) hypertension: Secondary | ICD-10-CM

## 2021-05-12 MED ORDER — ROSUVASTATIN CALCIUM 20 MG PO TABS: ORAL_TABLET | ORAL | 0 refills | Status: DC

## 2021-05-12 MED ORDER — METFORMIN HCL 500 MG PO TABS
ORAL_TABLET | ORAL | 0 refills | Status: DC
Start: 2021-05-12 — End: 2021-07-21

## 2021-05-12 MED ORDER — ATENOLOL 50 MG PO TABS: ORAL_TABLET | ORAL | 1 refills | Status: DC

## 2021-05-12 MED ORDER — OXYBUTYNIN CHLORIDE ER 10 MG PO TB24
ORAL_TABLET | ORAL | 0 refills | Status: DC
Start: 2021-05-12 — End: 2021-11-29

## 2021-05-29 NOTE — Progress Notes (Signed)
Assessment and Plan:  Michele [name] was seen today for acute visit.  Diagnoses and all orders for this visit:  Acute bronchitis, unspecified organism Hx of recurrent bronchitis around this time Overall sx are improving, denies fever/chills/dyspnea/fatigue - low suspicion of CAP Lung sounds and cough much improved with duoneb Will give inhaler script with steroid taper Discussed current guidelines and abx not necessary with bronchitis unless concern for worsening and possible pneumonia Will try tessalon for cough, if not helping prescribe codeine cough syrup Suggested symptomatic OTC remedies. Nasal saline spray for congestion. Follow up as needed. -     ipratropium-albuterol (DUONEB) 0.5-2.5 (3) MG/3ML nebulizer solution 3 mL -     budesonide-formoterol (SYMBICORT) 160-4.5 MCG/ACT inhaler; Inhale 2 puffs into the lungs in the morning and at bedtime. Brush teeth and gargle after each use. -     predniSONE (DELTASONE) 20 MG tablet; 2 tablets daily for 3 days, 1 tablet daily for 4 days. -     benzonatate (TESSALON) 200 MG capsule; Take 1 tab three times a day as needed for cough.  Encounter for screening for COVID-19 -     POC COVID-19  Flu-like symptoms -     POCT Influenza A/B   Further disposition pending results of labs. Discussed med's effects and SE's.   Over 30 minutes of exam, counseling, chart review, and critical decision making was performed.   Future Appointments  Date Time Provider [place]  07/05/2021 11:00 AM Magda Bernheim, NP [facility] None  10/02/2021 11:30 AM [name], MD [facility] None  12/18/2021 10:30 AM Ward Givens, NP [facility] None  01/08/2022  3:30 PM Newton Pigg, RPH [facility] None  04/01/2022 10:00 AM [name], MD [facility] None    ------------------------------------------------------------------------------------------------------------------   HPI BP (!) 160/72    Pulse 85    Temp 97.7 F (36.5 C)    Wt 155 lb (70.3 kg)     SpO2 98%    BMI 27.46 kg/m  75 y.o.female presents for evaluation of deep productive cough x 9 days. She tested negative for covid and flu today prior to being seen.   She reports husband had similar sx, he is improved but still coughing some. Patient reports her sx began 8-9 days ago with coughing fits, nasal congestion (improving), was having trouble with coughing fits but is improving, but still having at night.  Denies dyspnea. She has some chest pressure fafter but seems to be improvingaf  She reports has been taking nyquil and promethazine DM with limited benefit. Has been drinking whisky which has helped more with the cough.   Hx of recurrent bronchitis typical during this season.   OSA on CPAP, struggling due to cough and congestion.  Never smoker.  Past Medical History:  Diagnosis Date   Anemia    Anxiety    Arthritis    Carpal tunnel syndrome    Cerebrovascular disease 01/02/2017   Duodenal diverticulum    Elevated uric acid in blood    Esophageal motility disorder    Fatty liver    GERD (gastroesophageal reflux disease)    Hyperlipidemia    Hypertension    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   Sleep apnea    Tubulovillous adenoma of colon    Type II or unspecified type diabetes mellitus without mention of complication, not stated as uncontrolled      Allergies  Allergen Reactions   Lipitor [Atorvastatin]     Elevates LFT's    Current Outpatient Medications on File Prior to Visit  Medication Sig   acetaminophen (TYLENOL) 500 MG tablet Take 500 mg by mouth 3 (three) times daily as needed for moderate pain.   aspirin 81 MG tablet Take 1 tablet (81 mg total) by mouth daily. (Patient taking differently: Take 325 mg by mouth daily.)   atenolol (TENORMIN) 50 MG tablet TAKE 1 TO 2 TABLETS BY MOUTH IN THE MORNING FOR BLOOD PRESSURE.   BIOTIN PO Take 5,000 mcg by mouth daily.   blood glucose meter kit and supplies KIT Check blood sugar 1 time daily-DX-E11.22   blood glucose meter  kit and supplies Test sugar once a day.   Blood Glucose Monitoring Suppl DEVI Test sugars once daily.DX diabetes   Calcium Carbonate (CALCIUM 600 PO) Take 1,200 Units by mouth daily.   Cholecalciferol (VITAMIN D PO) Take 10,000 Units by mouth. 2 times a week no W and F   cloNIDine (CATAPRES) 0.2 MG tablet Take      1 tablet       3 x /day        for BP. Take if BP > 160   Cyanocobalamin (VITAMIN B-12 PO) Take 1 tablet by mouth daily. 5000 mc one time in the AM   ferrous sulfate 325 (65 FE) MG tablet Take 325 mg by mouth. 2 times a week   glucose blood test strip Test sugar once a day DX diabetes   glucose blood test strip Test blood sugar once daily   Lancets MISC Test blood sugar once daily   Magnesium 500 MG TABS Take 1 tablet by mouth 3 (three) times daily.   Menaquinone-7 (VITAMIN K2 PO) Take 1 tablet by mouth daily.   metFORMIN (GLUCOPHAGE) 500 MG tablet Take  2 tablets  2 x /day with Meals  for Diabetes   minoxidil (LONITEN) 10 MG tablet Take 1/2 to 1 tablet Daily for BP   olmesartan (BENICAR) 40 MG tablet Take  1 tablet   Daily for BP   Omega-3 Fatty Acids (FISH OIL PO) Take 1 capsule by mouth daily. 3 TIMES PER WEEK IN THE AM   oxybutynin (DITROPAN-XL) 10 MG 24 hr tablet Take  1 tablet  Daily  for Bladder   rosuvastatin (CRESTOR) 20 MG tablet TAKE 1 TABLET BY MOUTH ONCE DAILY FOR CHOLESTEROL   sertraline (ZOLOFT) 50 MG tablet Take  1 tablet  Daily  for Mood   verapamil (CALAN-SR) 240 MG CR tablet Take  1 tablet  Daily  with Food for BP & Heart Rhythm   zinc gluconate 50 MG tablet Take 50 mg by mouth daily. In the AM   No current facility-administered medications on file prior to visit.   Allergies:  Allergies  Allergen Reactions   Lipitor [Atorvastatin]     Elevates LFT's   Family History:  Herfamily history includes Cerebral aneurysm in her sister; Colon polyps in her mother; Diabetes in her father and mother; Heart attack in her brother and father; Heart disease in her  brother, brother, father, and mother. Social History:   reports that she has never smoked. She has never used smokeless tobacco. She reports that she does not drink alcohol and does not use drugs.   ROS: all negative except above.   Physical Exam:  BP (!) 160/72    Pulse 85    Temp 97.7 F (36.5 C)    Wt 155 lb (70.3 kg)    SpO2 98%    BMI 27.46 kg/m   General Appearance: Well nourished, in no apparent  distress. Eyes: PERRLA, EOMs, conjunctiva no swelling or erythema Sinuses: No Frontal/maxillary tenderness ENT/Mouth: Ext aud canals clear, TMs without erythema, bulging. No erythema, swelling, or exudate on post pharynx.  Tonsils not swollen or erythematous. Hearing normal.  Neck: Supple Respiratory: Respiratory effort normal, BS with central coarse bronchial sounds without rales, rhonchi, fine wheezing or stridor.  Cardio: RRR with no MRGs. Brisk peripheral pulses without edema.  Lymphatics: Non tender without lymphadenopathy.  Musculoskeletal: normal gait.  Skin: Warm, dry without rashes, lesions, ecchymosis.  Neuro: Normal muscle tone Psych: Awake and oriented X 3, normal affect, Insight and Judgment appropriate.     Izora Ribas, NP 2:54 PM [facility]

## 2021-05-30 ENCOUNTER — Ambulatory Visit (INDEPENDENT_AMBULATORY_CARE_PROVIDER_SITE_OTHER): Payer: HMO | Admitting: Adult Health

## 2021-05-30 ENCOUNTER — Encounter: Payer: Self-pay | Admitting: Adult Health

## 2021-05-30 ENCOUNTER — Other Ambulatory Visit: Payer: Self-pay

## 2021-05-30 VITALS — BP 134/76 | HR 85 | Temp 97.7°F | Wt 155.0 lb

## 2021-05-30 DIAGNOSIS — R6889 Other general symptoms and signs: Secondary | ICD-10-CM | POA: Diagnosis not present

## 2021-05-30 DIAGNOSIS — Z1152 Encounter for screening for COVID-19: Secondary | ICD-10-CM | POA: Diagnosis not present

## 2021-05-30 DIAGNOSIS — J209 Acute bronchitis, unspecified: Secondary | ICD-10-CM | POA: Diagnosis not present

## 2021-05-30 DIAGNOSIS — R0989 Other specified symptoms and signs involving the circulatory and respiratory systems: Secondary | ICD-10-CM

## 2021-05-30 LAB — POCT INFLUENZA A/B
Influenza A, POC: NEGATIVE
Influenza B, POC: NEGATIVE

## 2021-05-30 LAB — POC COVID19 BINAXNOW: SARS Coronavirus 2 Ag: NEGATIVE

## 2021-05-30 MED ORDER — PREDNISONE 20 MG PO TABS: ORAL_TABLET | ORAL | 0 refills | Status: DC

## 2021-05-30 MED ORDER — BUDESONIDE-FORMOTEROL FUMARATE 160-4.5 MCG/ACT IN AERO: 2.0000 | INHALATION_SPRAY | Freq: Two times a day (BID) | RESPIRATORY_TRACT | 1 refills | Status: DC

## 2021-05-30 MED ORDER — BENZONATATE 200 MG PO CAPS: ORAL_CAPSULE | ORAL | 1 refills | Status: DC

## 2021-05-30 MED ORDER — IPRATROPIUM-ALBUTEROL 0.5-2.5 (3) MG/3ML IN SOLN: 3.0000 mL | Freq: Once | RESPIRATORY_TRACT | Status: DC

## 2021-06-21 ENCOUNTER — Telehealth: Payer: Self-pay | Admitting: Adult Health

## 2021-06-21 DIAGNOSIS — G4733 Obstructive sleep apnea (adult) (pediatric): Secondary | ICD-10-CM | POA: Diagnosis not present

## 2021-06-21 NOTE — Telephone Encounter (Signed)
Pt has called to report that she was told by DME that for new CPAP supplies a new prescription is needed.

## 2021-06-21 NOTE — Telephone Encounter (Signed)
Pt wanted to provide her new insurance [employer] care Member NL:[id number] Health Plan# [phone] Rx [phone] Rx [id number] Group# [id number]

## 2021-06-21 NOTE — Telephone Encounter (Signed)
DL looks good.  

## 2021-06-21 NOTE — Telephone Encounter (Signed)
See compliance report below. Pt's last visit was December 12, 2020. Next appt pending for 12/18/21. Order placed for CPAP supply renewal and sent to [facility].

## 2021-06-28 ENCOUNTER — Other Ambulatory Visit: Payer: Self-pay | Admitting: Internal Medicine

## 2021-06-28 ENCOUNTER — Other Ambulatory Visit: Payer: Self-pay | Admitting: Physician Assistant

## 2021-06-28 DIAGNOSIS — N182 Chronic kidney disease, stage 2 (mild): Secondary | ICD-10-CM

## 2021-06-28 DIAGNOSIS — E1122 Type 2 diabetes mellitus with diabetic chronic kidney disease: Secondary | ICD-10-CM

## 2021-06-28 MED ORDER — GLUCOSE BLOOD VI STRP: ORAL_STRIP | 3 refills | Status: DC

## 2021-07-03 NOTE — Progress Notes (Signed)
MEDICARE WELLNESS/ 3 MONTH FOLLOW UP  Assessment:    Encounter for Medicare annual wellness exam Due annually  Health maintenance reviewed  Type 2 diabetes mellitus with stage 2 chronic kidney disease, without long-term current use of insulin (HCC) -     Hemoglobin A1c Discussed general issues about diabetes pathophysiology and management., Educational material distributed., Suggested low cholesterol diet., Encouraged aerobic exercise., Discussed foot care., Reminded to get yearly retinal exam.  Hyperlipidemia associated with type 2 diabetes mellitus ([place]) -     Lipid panel check lipids, LDL goal <70 decrease fatty foods increase activity.   CKD stage 2 due to type 2 diabetes mellitus (HCC) Increase fluids, avoid NSAIDS, monitor sugars, will monitor -     CMP/GFR  Essential hypertension Continue medications Monitor blood pressure at home; call if consistently over 140/80 Continue DASH diet.   Reminder to go to the ER if any CP, SOB, nausea, dizziness, severe HA, changes vision/speech, left arm numbness and tingling and jaw pain. -     CBC with Differential/Platelet -     COMPLETE METABOLIC PANEL WITH GFR -     TSH  Medication management Continued  Vitamin D deficiency Continue supplement  Cerebrovascular disease Control blood pressure, cholesterol, glucose, increase exercise.   Sleep apnea, unspecified type Last sleep study 12/2017. [facility].  Continue CPAP   Fatty liver Monitor LFTs, check CMP Weight loss by low processed [place] diet advised  Overweight (BMI 27.0-27.9) - long discussion about weight loss, diet, and exercise -recommended diet heavy in fruits and veggies and low in animal meats, cheeses, and dairy products  History of hyperthyroidism Continue to check TSH  COLONIC POLYPS, ADENOMATOUS, HX OF Due 2024, high fiber diet encouraged  Gastroesophageal reflux disease, unspecified whether esophagitis present Continue PPI/H2 blocker, diet  discussed  Vitiligo Monitor  Depression, major, recurrent, in partial remission (HCC)/ anxiety - continue medications, stress management techniques discussed, increase water, good sleep hygiene discussed, increase exercise, and increase veggies.  - Add Alprazolam 0.5 mg 1/2 tab as needed for anxiety- advised to limit to 5 days a week Has increased stress due to deaths in family, if not controlled with this addition call the office and will plan to increase Sertraline dosage        Over 30 minutes of exam, counseling, chart review and critical decision making was performed Future Appointments  Date Time Provider [place]  10/02/2021 11:30 AM [name], MD [facility] None  12/18/2021 10:30 AM Ward Givens, NP [facility] None  01/08/2022  3:30 PM Newton Pigg, [place] [facility] None  04/01/2022 10:00 AM [name], MD [facility] None  07/05/2022 10:00 AM Magda Bernheim, NP [facility] None    Plan:   During the course of the visit the patient was educated and counseled about appropriate screening and preventive services including:   Pneumococcal vaccine  Prevnar 13 Influenza vaccine Td vaccine Screening electrocardiogram Bone densitometry screening Colorectal cancer screening Diabetes screening Glaucoma screening Nutrition counseling  Advanced directives: requested     Subjective:  Michele [name] is a 76 y.o. female who presents for 3 month follow up and medicare wellness visit. She has Hyperlipidemia associated with type 2 diabetes mellitus ([place]); Essential hypertension; GERD; Fatty liver; COLONIC POLYPS, ADENOMATOUS, HX OF; CKD stage 2 due to type 2 diabetes mellitus ([place]); Vitamin D deficiency; Medication management; History of hyperthyroidism; Cerebrovascular disease; Encounter for Medicare annual wellness exam; Type 2 diabetes mellitus ([place]); Overweight (BMI 25.0-29.9); Vitiligo; Sleep apnea on CPAP; Anxiety; Depression, major, recurrent,  in partial  remission ([place]); Osteopenia; and Lumbar spondylosis on their problem list.  She continues to have pain in her lower back and hip but states it is not as bad as previous. She continues to have some occasional numbness and tingling. She has persistent right great toe toe nail fungus.  Used Lamisil in the past but nail fungus has persisted.  It is not causing any pain.   Depression in remission on zoloft 50 mg daily. She is feeling more anxiety. Feels nervous. Lots of social stressors  Grandson living with her he lost his job.  2 deaths in the family this month.   BMI is Body mass index is 27.78 kg/m., she has not been working on diet and exercise but has garden and mows 6 yards, active and outdoors most of the day.  Wt Readings from Last 3 Encounters:  07/05/21 156 lb 12.8 oz (71.1 kg)  05/30/21 155 lb (70.3 kg)  04/10/21 156 lb 6.4 oz (70.9 kg)    Her blood pressure has been controlled at home, renal US 2014,  she is on benicar 18m, verapamil 240, minoxidil 10 mg , she will take clonidine 0.1 mg if her systolic is above 1672 will do this once a week or less often, today their BP is BP: (!) 110/58. She took Clonidine last night BP Readings from Last 3 Encounters:  07/05/21 (!) 110/58  05/30/21 134/76  04/10/21 (!) 158/82     She does have OSA, is on CPAP at least 4 hours a night but it will give her a headache. Last sleep study 12/2017. [facility].   She does workout (works on a farm) She denies chest pain, shortness of breath, dizziness.    She has not been working on diet and exercise for T2 diabetes With CKD II on olmesartan, is on ASA With hyperlipidemia crestor 20 mg M, W, S and denies myalgias,  at goal less than 70  well controlled with metformin with recent A1C diabetic range Fasting runs 100-110's denies increased appetite, nausea, paresthesia of the feet, polydipsia, polyuria, visual disturbances and vomiting.  Last A1C in the office was:  Lab Results  Component Value Date    HGBA1C 5.8 (H) 03/30/2021   She is on Rosuvastatin and is not having any myalgias. Lab Results  Component Value Date   CHOL 174 03/30/2021   HDL 53 03/30/2021   LDLCALC 94 03/30/2021   TRIG 176 (H) 03/30/2021   CHOLHDL 3.3 03/30/2021   Lab Results  Component Value Date   GFRNONAA 68 09/28/2020   Patient is on Vitamin D supplement, taking 10000 IU daily:    Lab Results  Component Value Date   VD25OH 82 03/30/2021         Medication Review:  Current Outpatient Medications (Endocrine & Metabolic):    metFORMIN (GLUCOPHAGE) 500 MG tablet, Take  2 tablets  2 x /day with Meals  for Diabetes   predniSONE (DELTASONE) 20 MG tablet, 2 tablets daily for 3 days, 1 tablet daily for 4 days. (Patient not taking: Reported on 07/05/2021)  Current Outpatient Medications (Cardiovascular):    atenolol (TENORMIN) 50 MG tablet, TAKE 1 TO 2 TABLETS BY MOUTH IN THE MORNING FOR BLOOD PRESSURE.   cloNIDine (CATAPRES) 0.2 MG tablet, Take      1 tablet       3 x /day        for BP. Take if BP > 160   minoxidil (LONITEN) 10 MG tablet, Take 1/2 to  1 tablet Daily for BP   olmesartan (BENICAR) 40 MG tablet, Take  1 tablet   Daily for BP   rosuvastatin (CRESTOR) 20 MG tablet, TAKE 1 TABLET BY MOUTH ONCE DAILY FOR CHOLESTEROL   verapamil (CALAN-SR) 240 MG CR tablet, Take  1 tablet  Daily  with Food for BP & Heart Rhythm  Current Outpatient Medications (Respiratory):    benzonatate (TESSALON) 200 MG capsule, Take 1 tab three times a day as needed for cough. (Patient not taking: Reported on 07/05/2021)   budesonide-formoterol (SYMBICORT) 160-4.5 MCG/ACT inhaler, Inhale 2 puffs into the lungs in the morning and at bedtime. Brush teeth and gargle after each use. (Patient not taking: Reported on 07/05/2021)  Current Outpatient Medications (Analgesics):    acetaminophen (TYLENOL) 500 MG tablet, Take 500 mg by mouth 3 (three) times daily as needed for moderate pain.   aspirin 325 MG tablet, Take 325 mg by mouth  daily.   aspirin 81 MG tablet, Take 1 tablet (81 mg total) by mouth daily. (Patient not taking: Reported on 07/05/2021)  Current Outpatient Medications (Hematological):    Cyanocobalamin (VITAMIN B-12 PO), Take 1 tablet by mouth daily. 5000 mc one time in the AM   ferrous sulfate 325 (65 FE) MG tablet, Take 325 mg by mouth. 2 times a week (Patient not taking: Reported on 07/05/2021)  Current Outpatient Medications (Other):    Calcium Carbonate (CALCIUM 600 PO), Take 1,200 Units by mouth daily.   Cholecalciferol (VITAMIN D PO), Take 10,000 Units by mouth. 2 times a week no W and F   Magnesium 500 MG TABS, Take 1 tablet by mouth 3 (three) times daily.   Menaquinone-7 (VITAMIN K2 PO), Take 1 tablet by mouth daily.   Omega-3 Fatty Acids (FISH OIL PO), Take 1 capsule by mouth daily. 3 TIMES PER WEEK IN THE AM   oxybutynin (DITROPAN-XL) 10 MG 24 hr tablet, Take  1 tablet  Daily  for Bladder   sertraline (ZOLOFT) 50 MG tablet, Take  1 tablet  Daily  for Mood   zinc gluconate 50 MG tablet, Take 50 mg by mouth daily. In the AM   ALPRAZolam (XANAX) 0.5 MG tablet, TAKE 1 TABLET BY MOUTH TWICE DAILY AS NEEDED FOR ANXIETY , LIMIT TO 5 DAYS A WEEK   blood glucose meter kit and supplies KIT, Check blood sugar 1 time daily-DX-E11.22   blood glucose meter kit and supplies, Test sugar once a day.   Blood Glucose Monitoring Suppl DEVI, Test sugars once daily.DX diabetes   glucose blood test strip, Test  Blood Sugar  Daily  (Dx:  e11.29  ))   Lancets MISC, Test blood sugar once daily   Current Problems (verified) Patient Active Problem List   Diagnosis Date Noted   Lumbar spondylosis 01/01/2021   Osteopenia 06/08/2020   Depression, major, recurrent, in partial remission ([place]) 11/02/2019   Anxiety 12/10/2018   Sleep apnea on CPAP 09/18/2018   Vitiligo 11/09/2017   Encounter for Medicare annual wellness exam 07/24/2017   Type 2 diabetes mellitus ([place]) 07/24/2017   Overweight (BMI 25.0-29.9) 07/24/2017    Cerebrovascular disease 01/02/2017   History of hyperthyroidism 09/11/2015   Vitamin D deficiency 06/28/2013   Medication management 06/28/2013   CKD stage 2 due to type 2 diabetes mellitus ([place])    Hyperlipidemia associated with type 2 diabetes mellitus ([place]) 03/21/2008   Essential hypertension 03/21/2008   GERD 03/21/2008   Fatty liver 03/21/2008   COLONIC POLYPS, ADENOMATOUS, HX OF 03/21/2008  Allergies Allergies  Allergen Reactions   Lipitor [Atorvastatin]     Elevates LFT's   Immunization History  Administered Date(s) Administered   Influenza Split 03/25/2013   Influenza, High Dose Seasonal PF 03/15/2014, 03/07/2015, 03/27/2016, 04/21/2017, 02/18/2018, 03/11/2019, 03/20/2020, 03/30/2021   PFIZER(Purple Top)SARS-COV-2 Vaccination 06/24/2019, 07/15/2019, 03/08/2020   Pneumococcal Conjugate-13 01/26/2014   Pneumococcal Polysaccharide-23 03/14/2011   Td 03/25/2013   Zoster, Live 06/03/2005   Health Maintenance  Topic Date Due   COVID-19 Vaccine (4 - Booster for Pfizer series) 07/21/2021 (Originally 05/03/2020)   Zoster Vaccines- Shingrix (1 of 2) 10/02/2021 (Originally 01/01/1965)   HEMOGLOBIN A1C  09/28/2021   OPHTHALMOLOGY EXAM  11/29/2021   FOOT EXAM  03/29/2022   COLONOSCOPY (Pts 45-20yr Insurance coverage will need to be confirmed)  02/11/2023   TETANUS/TDAP  03/26/2023   Pneumonia Vaccine 76 Years old  Completed   INFLUENZA VACCINE  Completed   DEXA SCAN  Completed   Hepatitis C Screening  Completed   HPV VACCINES  Aged Out      Vision: Dr. MEllie Lunch- last 11/2020- no retinopathy Dental: Dr. SClaudie Leach last 2022  Patient Care Team: MUnk Pinto MD as PCP - General (Internal Medicine) ERush [facility] (Pharmacist)   SURGICAL HISTORY She  has a past surgical history that includes Appendectomy; Tonsillectomy (1952); Carpal tunnel release (Right, 1992); Cyst removal hand; cataract extration; Colonoscopy; and Breast biopsy (Right). FAMILY  HISTORY Her family history includes Cerebral aneurysm in her sister; Colon polyps in her mother; Diabetes in her father and mother; Heart attack in her brother and father; Heart disease in her brother, brother, father, and mother. SOCIAL HISTORY She  reports that she has never smoked. She has never used smokeless tobacco. She reports that she does not drink alcohol and does not use drugs.  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, does lots of outside work), Time (Minutes): 30, Frequency (Times/Week): 5, Weekly Exercise (Minutes/Week): 150 Cardiac risk factors: Cardiac Risk Factors include: diabetes mellitus;hypertension;advanced age (>533m, >6>57omen) Depression/mood screen:   [facility] 07/05/2021  Decreased Interest 1  Down, Depressed, Hopeless 0  PHQ - 2 Score 1  Altered sleeping -  Tired, decreased energy -  Change in appetite -  Feeling bad or failure about yourself  -  Trouble concentrating -  Moving slowly or fidgety/restless -  Suicidal thoughts -  PHQ-9 Score -  Difficult doing work/chores -  Some recent data might be hidden    ADLs:  In your present state of health, do you have any difficulty performing the following activities: 07/05/2021 03/29/2021  Hearing? N N  Vision? N N  Difficulty concentrating or making decisions? N N  Walking or climbing stairs? N N  Dressing or bathing? N N  Doing errands, shopping? N N  Some recent data might be hidden     Cognitive Testing  Alert? Yes  Normal Appearance?Yes  Oriented to person? Yes  Place? Yes   Time? Yes  Recall of three objects?  Yes  Can perform simple calculations? Yes  Displays appropriate judgment?Yes  Can read the correct time from a watch face?Yes  EOL planning: Does Patient Have a Medical Advance Directive?: Yes Type of Advance Directive: Healthcare Power of Attorney, Living  will Does patient want to make changes to medical advance directive?: No - Patient declined Copy of He[place]n Chart?: No - copy  requested      Review of Systems  Constitutional:  Negative for chills, fever, malaise/fatigue and weight loss.  HENT:  Negative for congestion, ear pain, hearing loss and tinnitus.   Eyes:  Negative for blurred vision and double vision.  Respiratory:  Negative for cough, shortness of breath and wheezing.   Cardiovascular:  Negative for chest pain, palpitations, orthopnea, claudication and leg swelling.  Gastrointestinal:  Negative for abdominal pain, blood in stool, constipation, diarrhea, heartburn, melena, nausea and vomiting.  Genitourinary: Negative.   Musculoskeletal:  Positive for back pain (lumbar R) and joint pain (R hip). Negative for falls and myalgias.  Skin:  Negative for rash.  Neurological:  Positive for tingling (of feet bilaterally). Negative for dizziness, tremors, sensory change, speech change, focal weakness, seizures, loss of consciousness, weakness and headaches.  Endo/Heme/Allergies:  Negative for polydipsia.  Psychiatric/Behavioral:  Negative for depression, memory loss, substance abuse and suicidal ideas. The patient is nervous/anxious. The patient does not have insomnia.   All other systems reviewed and are negative.  Objective:     Today's Vitals   07/05/21 1050  BP: (!) 110/58  Pulse: 62  Temp: (!) 97.5 F (36.4 C)  SpO2: 98%  Weight: 156 lb 12.8 oz (71.1 kg)    Body mass index is 27.78 kg/m. General Appearance: Well nourished, in no apparent distress. Eyes: conjunctiva no swelling or erythema ENT/Mouth: No erythema, swelling, or exudate on post pharynx.  Tonsils not swollen or erythematous. Hearing normal.  Neck: Supple Respiratory: Respiratory effort normal, BS equal bilaterally without rales, rhonchi, wheezing or stridor.  Cardio: RRR with no holosystolic murmur. Brisk peripheral pulses without  edema.  Abdomen: Soft, + BS.  Non tender, no guarding, rebound, hernias, masses. Lymphatics: no palpable lymphadenopathy Musculoskeletal: Symmetrical strength, normal gait. She has lumbar pain with ROM limited in all spheres, neg straight leg raise, L hip ROM normal, R with guarding secondary to lumbar/gluteal pain, no crepitus or popping. No tenderness  Skin: Warm, dry without rashes, lesions, ecchymosis. Irregular patchy brown discoloration throughout. Bilateral big toe nails with thickening, brittle and yellow, no erythema. Normal at bases.  Neuro: Cranial nerves intact. Normal muscle tone, no cerebellar symptoms. Sensation intact.  Psych: Awake and oriented X 3, normal affect, Insight and Judgment appropriate.     Medicare Attestation I have personally reviewed: The patient's medical and social history Their use of alcohol, tobacco or illicit drugs Their current medications and supplements The patient's functional ability including ADLs,fall risks, home safety risks, cognitive, and hearing and visual impairment Diet and physical activities Evidence for depression or mood disorders  The patient's weight, height, BMI, and visual acuity have been recorded in the chart.  I have made referrals, counseling, and provided education to the patient based on review of the above and I have provided the patient with a written personalized care plan for preventive services.     Magda Bernheim, NP   07/05/2021

## 2021-07-04 ENCOUNTER — Telehealth: Payer: Self-pay

## 2021-07-04 NOTE — Telephone Encounter (Signed)
Called patient for hypertension review call. Patient did not pick up. Unable to leave VM. Will try calling again in a few days.  Total time spent: 2 minutes Vanetta Shawl, [facility]

## 2021-07-05 ENCOUNTER — Other Ambulatory Visit: Payer: Self-pay

## 2021-07-05 ENCOUNTER — Encounter: Payer: Self-pay | Admitting: Nurse Practitioner

## 2021-07-05 ENCOUNTER — Ambulatory Visit (INDEPENDENT_AMBULATORY_CARE_PROVIDER_SITE_OTHER): Payer: HMO | Admitting: Nurse Practitioner

## 2021-07-05 VITALS — BP 110/58 | HR 62 | Temp 97.5°F | Wt 156.8 lb

## 2021-07-05 DIAGNOSIS — F3341 Major depressive disorder, recurrent, in partial remission: Secondary | ICD-10-CM | POA: Diagnosis not present

## 2021-07-05 DIAGNOSIS — K760 Fatty (change of) liver, not elsewhere classified: Secondary | ICD-10-CM

## 2021-07-05 DIAGNOSIS — E1122 Type 2 diabetes mellitus with diabetic chronic kidney disease: Secondary | ICD-10-CM | POA: Diagnosis not present

## 2021-07-05 DIAGNOSIS — G4730 Sleep apnea, unspecified: Secondary | ICD-10-CM

## 2021-07-05 DIAGNOSIS — R42 Dizziness and giddiness: Secondary | ICD-10-CM

## 2021-07-05 DIAGNOSIS — M545 Low back pain, unspecified: Secondary | ICD-10-CM

## 2021-07-05 DIAGNOSIS — I679 Cerebrovascular disease, unspecified: Secondary | ICD-10-CM | POA: Diagnosis not present

## 2021-07-05 DIAGNOSIS — F419 Anxiety disorder, unspecified: Secondary | ICD-10-CM

## 2021-07-05 DIAGNOSIS — E663 Overweight: Secondary | ICD-10-CM

## 2021-07-05 DIAGNOSIS — I10 Essential (primary) hypertension: Secondary | ICD-10-CM | POA: Diagnosis not present

## 2021-07-05 DIAGNOSIS — Z86010 Personal history of colonic polyps: Secondary | ICD-10-CM | POA: Diagnosis not present

## 2021-07-05 DIAGNOSIS — N183 Chronic kidney disease, stage 3 unspecified: Secondary | ICD-10-CM

## 2021-07-05 DIAGNOSIS — E782 Mixed hyperlipidemia: Secondary | ICD-10-CM

## 2021-07-05 DIAGNOSIS — R6889 Other general symptoms and signs: Secondary | ICD-10-CM

## 2021-07-05 DIAGNOSIS — Z8639 Personal history of other endocrine, nutritional and metabolic disease: Secondary | ICD-10-CM | POA: Diagnosis not present

## 2021-07-05 DIAGNOSIS — M25551 Pain in right hip: Secondary | ICD-10-CM

## 2021-07-05 DIAGNOSIS — G8929 Other chronic pain: Secondary | ICD-10-CM

## 2021-07-05 DIAGNOSIS — N1831 Chronic kidney disease, stage 3a: Secondary | ICD-10-CM | POA: Diagnosis not present

## 2021-07-05 DIAGNOSIS — E785 Hyperlipidemia, unspecified: Secondary | ICD-10-CM

## 2021-07-05 DIAGNOSIS — E559 Vitamin D deficiency, unspecified: Secondary | ICD-10-CM | POA: Diagnosis not present

## 2021-07-05 DIAGNOSIS — E1169 Type 2 diabetes mellitus with other specified complication: Secondary | ICD-10-CM | POA: Diagnosis not present

## 2021-07-05 DIAGNOSIS — Z0000 Encounter for general adult medical examination without abnormal findings: Secondary | ICD-10-CM

## 2021-07-05 DIAGNOSIS — Z6827 Body mass index (BMI) 27.0-27.9, adult: Secondary | ICD-10-CM

## 2021-07-05 DIAGNOSIS — L80 Vitiligo: Secondary | ICD-10-CM

## 2021-07-05 DIAGNOSIS — Z0001 Encounter for general adult medical examination with abnormal findings: Secondary | ICD-10-CM

## 2021-07-05 DIAGNOSIS — Z79899 Other long term (current) drug therapy: Secondary | ICD-10-CM

## 2021-07-05 DIAGNOSIS — K219 Gastro-esophageal reflux disease without esophagitis: Secondary | ICD-10-CM

## 2021-07-05 MED ORDER — ALPRAZOLAM 0.5 MG PO TABS: ORAL_TABLET | ORAL | 0 refills | Status: DC

## 2021-07-06 ENCOUNTER — Telehealth: Payer: Self-pay

## 2021-07-06 LAB — LIPID PANEL
Cholesterol: 126 mg/dL (ref <200)
HDL: 41 mg/dL — ABNORMAL LOW (ref >=50)
LDL Cholesterol (Calc): 58 mg/dL (calc)
Non-HDL Cholesterol (Calc): 85 mg/dL (calc) (ref <130)
Total CHOL/HDL Ratio: 3.1 (calc) (ref <5.0)
Triglycerides: 197 mg/dL — ABNORMAL HIGH (ref <150)

## 2021-07-06 LAB — CBC WITH DIFFERENTIAL/PLATELET
Absolute Monocytes: 425 cells/uL (ref 200–950)
Basophils Absolute: 30 cells/uL (ref 0–200)
Basophils Relative: 0.6 %
Eosinophils Absolute: 140 cells/uL (ref 15–500)
Eosinophils Relative: 2.8 %
HCT: 36.7 % (ref 35.0–45.0)
Hemoglobin: 11.7 g/dL (ref 11.7–15.5)
Lymphs Abs: 1745 cells/uL (ref 850–3900)
MCH: 25.8 pg — ABNORMAL LOW (ref 27.0–33.0)
MCHC: 31.9 g/dL — ABNORMAL LOW (ref 32.0–36.0)
MCV: 80.8 fL (ref 80.0–100.0)
MPV: 9.4 fL (ref 7.5–12.5)
Monocytes Relative: 8.5 %
Neutro Abs: 2660 cells/uL (ref 1500–7800)
Neutrophils Relative %: 53.2 %
Platelets: 195 10*3/uL (ref 140–400)
RBC: 4.54 10*6/uL (ref 3.80–5.10)
RDW: 14.7 % (ref 11.0–15.0)
Total Lymphocyte: 34.9 %
WBC: 5 10*3/uL (ref 3.8–10.8)

## 2021-07-06 LAB — COMPLETE METABOLIC PANEL WITH GFR
AG Ratio: 2 (calc) (ref 1.0–2.5)
ALT: 36 U/L — ABNORMAL HIGH (ref 6–29)
AST: 34 U/L (ref 10–35)
Albumin: 4.3 g/dL (ref 3.6–5.1)
Alkaline phosphatase (APISO): 65 U/L (ref 37–153)
BUN: 14 mg/dL (ref 7–25)
CO2: 26 mmol/L (ref 20–32)
Calcium: 9.7 mg/dL (ref 8.6–10.4)
Chloride: 104 mmol/L (ref 98–110)
Creat: 0.94 mg/dL (ref 0.60–1.00)
Globulin: 2.2 g/dL (calc) (ref 1.9–3.7)
Glucose, Bld: 174 mg/dL — ABNORMAL HIGH (ref 65–99)
Potassium: 4.4 mmol/L (ref 3.5–5.3)
Sodium: 139 mmol/L (ref 135–146)
Total Bilirubin: 1 mg/dL (ref 0.2–1.2)
Total Protein: 6.5 g/dL (ref 6.1–8.1)
eGFR: 63 mL/min/{1.73_m2} (ref >=60)

## 2021-07-06 LAB — HEMOGLOBIN A1C
Hgb A1c MFr Bld: 6.2 % of total Hgb — ABNORMAL HIGH (ref <5.7)
Mean Plasma Glucose: 131 mg/dL
eAG (mmol/L): 7.3 mmol/L

## 2021-07-06 LAB — TSH: TSH: 1.27 mIU/L (ref 0.40–4.50)

## 2021-07-06 NOTE — Telephone Encounter (Signed)
Called patient to complete a fall risk review call. Unable to reach patient. VM box is full.   Total time spent:8 [place], [facility]

## 2021-07-09 ENCOUNTER — Telehealth: Payer: Self-pay

## 2021-07-09 NOTE — Telephone Encounter (Signed)
Called patient for hypertension review call. Accidentally did not fully complete assessment and will have to call patient at another time. Pt. stated she was busy preparing food. I don't want to bother her, so I will give her a call again this week to finish her assessment.   Total time spent: [place], [facility]

## 2021-07-13 ENCOUNTER — Telehealth: Payer: Self-pay

## 2021-07-13 NOTE — Telephone Encounter (Signed)
Called patient to complete fall risk assessment. Pt. Did not pick up. Voicemail box is full, unable to leave message. Will try calling at another time.  Total time spent: 2 minutes Vanetta Shawl, [facility]

## 2021-07-19 ENCOUNTER — Telehealth: Payer: Self-pay

## 2021-07-19 NOTE — Telephone Encounter (Signed)
Called patient for pharmacy dispensing call. Pt. Asked if it would be any cost to have her vitamins filled with [place], I told patient I was unsure and would find out for her. Called patient back to let her know that it would be an additonal cost, but patient did not answer. Unable to reach patient. Will try calling patient again at another time.   Total time spent: [place], [facility]

## 2021-07-20 ENCOUNTER — Telehealth: Payer: Self-pay

## 2021-07-20 NOTE — Telephone Encounter (Signed)
Called patient and informed her that it would be an additional cost to have her vitaminds filled, and let her know buying them OTC w/ be more cost affective. Patient verbalized agreement. PT. Asked when she will receive her medications, I told her I was not sure, but usually its around 2 weeks per my knowledge. Told patient she should get a call from the pharmacy with more information. Patient verbalized understanding and agreed. I asked patient if she had run out of any medications to do an acute form, pt. Stated "no".  Total time spent: [place], [facility]

## 2021-07-21 ENCOUNTER — Other Ambulatory Visit: Payer: Self-pay | Admitting: Nurse Practitioner

## 2021-07-21 DIAGNOSIS — E1122 Type 2 diabetes mellitus with diabetic chronic kidney disease: Secondary | ICD-10-CM

## 2021-07-31 ENCOUNTER — Telehealth: Payer: Self-pay

## 2021-07-31 NOTE — Telephone Encounter (Signed)
LM-07/31/21-4th attempt calling patient for fall risk call review and hypertension call review. Unsucessful reaching patient.  Total time spent: 2 Minutes Vanetta Shawl, [facility]

## 2021-08-13 ENCOUNTER — Other Ambulatory Visit: Payer: Self-pay | Admitting: Internal Medicine

## 2021-08-13 DIAGNOSIS — E782 Mixed hyperlipidemia: Secondary | ICD-10-CM

## 2021-08-13 MED ORDER — ROSUVASTATIN CALCIUM 20 MG PO TABS: ORAL_TABLET | ORAL | 0 refills | Status: DC

## 2021-08-15 ENCOUNTER — Other Ambulatory Visit: Payer: Self-pay | Admitting: Nurse Practitioner

## 2021-08-15 DIAGNOSIS — F419 Anxiety disorder, unspecified: Secondary | ICD-10-CM

## 2021-10-01 NOTE — Patient Instructions (Signed)

## 2021-10-01 NOTE — Progress Notes (Signed)
? ?Future Appointments  ?Date Time Provider Department  ?10/02/2021                   6 mo OV 11:30 AM [name], MD [facility]  ?12/18/2021 10:30 AM Ward Givens, NP [facility]  ?01/08/2022  3:30 PM Carleene Mains, RPH [facility]  ?04/01/2022              CPE 10:00 AM [name], MD [facility]  ?07/05/2022                Wellness 10:00 AM Magda Bernheim, NP [facility]  ? ? ?History of Present Illness: ? ? ?    This very nice 76 y.o. Michele [name] presents for 3 month follow up with HTN, HLD, T2_NIDDM and Vitamin D Deficiency. Patient has hx/o OSA, but is Mask Intolerant. Patient has hx/o Depression in partial remission on her meds and requests to increase dose Sertraline. She's also c/o bilateral shoulder & LBP.   ? ? ?    Patient is treated for HTN  (1978)  & BP has been controlled at home. Today?s BP is at goal - 140/70. Patient has had no complaints of any cardiac type chest pain, palpitations, dyspnea / orthopnea / PND, dizziness, claudication, or dependent edema. ? ? ?    Hyperlipidemia is controlled with diet & Rosuvastatin.   Patient denies myalgias or other med SE?s. Last Lipids were at goal except elevated Trig's : ? ?Lab Results  ?Component Value Date  ? CHOL 126 07/05/2021  ? HDL 41 (L) 07/05/2021  ? [place] 58 07/05/2021  ? TRIG 197 (H) 07/05/2021  ? CHOLHDL 3.1 07/05/2021  ? ? ? 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  Last A1c was not at goal : ? ?Lab Results  ?Component Value Date  ? HGBA1C 6.2 (H) 07/05/2021  ? ?    ? ?Further, the patient also has history of Vitamin D Deficiency ("19"/2008) and supplements vitamin D. Last vitamin D was at goal : ? ?Lab Results  ?Component Value Date  ? VD25OH 82 03/30/2021  ? ? ?Current Outpatient Medications on File Prior to Visit  ?Medication Sig  ? acetaminophen 500 MG tablet Take 500 mg  3 times daily as needed   ? ALPRAZolam   .5 MG tablet TAKE 1 TABLET  TWICE DAILY AS NEEDED    ? aspirin 325 MG tablet Take 325 mg daily.  ? atenolol  50 MG tablet TAKE 1 TO 2 TABLETS  IN THE MORNING   ? Calcium Carbonate 600  Take 1,200 mg daily.  ? VITAMIN D  Take 10,000 Units 2 times a week on  W and F  ? cloNIDine 0.2 MG tablet Take 1 tablet 3 x /day   ? VITAMIN B-12    5, 000 mcg tab Take 1 tablet daily  ? Magnesium 500 MG TABS Take 1 tablet 3 (times daily.  ? Menaquinone-7   (VITAMIN K2 ) Take 1 tablet daily.  ? metFORMIN (500 MG TAKE TWO TABLETS MORNING &  EVENING  ? minoxidil 10 MG tablet Take 1/2 to 1 tablet Daily   ? olmesartan  40 MG tablet Take  1 tablet   Daily  ? Omega-3 FISH OIL Take 1 capsule  3 TIMES PER WEEK   ? oxybutynin XL 10 MG  Take  1 tablet  Daily  for Bladder  ?  rosuvastatin CRESTOR 20 MG tablet Take  1 tablet  Daily   ? sertraline 50 MG tablet Take  1 tablet  Daily   ? Verapamil-SR 240 MG CR  Take  1 tablet  Daily    ? zinc gluconate 50 MG tablet Take daily  ? ? ? ?Allergies  ?Allergen Reactions  ? Lipitor [Atorvastatin]   ?  Elevates LFT's  ? ? ?PMHx:   ?Past Medical History:  ?Diagnosis Date  ? Anemia   ? Anxiety   ? Arthritis   ? Carpal tunnel syndrome   ? Cerebrovascular disease 01/02/2017  ? Duodenal diverticulum   ? Elevated uric acid in blood   ? Esophageal motility disorder   ? Fatty liver   ? GERD (gastroesophageal reflux disease)   ? Hyperlipidemia   ? Hypertension   ? 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 Sleep apnea   ? Tubulovillous adenoma of colon   ? Type II or unspecified type diabetes mellitus without mention of complication, not stated as uncontrolled   ? ? ? ?Immunization History  ?Administered Date(s) Administered  ? Influenza Split 03/25/2013  ? Influenza, High Dose 02/18/2018, 03/11/2019, 03/20/2020, 03/30/2021  ? PFIZER -SARS-COV-2 Vacc 06/24/2019, 07/15/2019, 03/08/2020  ? Pneumococcal -13 01/26/2014  ? Pneumococcal -23 03/14/2011  ? Td 03/25/2013  ? Zoster, Live 06/03/2005  ? ? ?Past Surgical History:  ?Procedure  Laterality Date  ? APPENDECTOMY    ? BREAST BIOPSY Right   ? CARPAL TUNNEL RELEASE Right 1992  ? cataract extration    ? COLONOSCOPY    ? CYST REMOVAL HAND    ? TONSILLECTOMY  1952  ? ? ?FHx:    Reviewed / unchanged ? ?SHx:    Reviewed / unchanged  ? ?Systems Review: ? ?Constitutional: Denies fever, chills, wt changes, headaches, insomnia, fatigue, night sweats, change in appetite. ?Eyes: Denies redness, blurred vision, diplopia, discharge, itchy, watery eyes.  ?ENT: Denies discharge, congestion, post nasal drip, epistaxis, sore throat, earache, hearing loss, dental pain, tinnitus, vertigo, sinus pain, snoring.  ?CV: Denies chest pain, palpitations, irregular heartbeat, syncope, dyspnea, diaphoresis, orthopnea, PND, claudication or edema. ?Respiratory: denies cough, dyspnea, DOE, pleurisy, hoarseness, laryngitis, wheezing.  ?Gastrointestinal: Denies dysphagia, odynophagia, heartburn, reflux, water brash, abdominal pain or cramps, nausea, vomiting, bloating, diarrhea, constipation, hematemesis, melena, hematochezia  or hemorrhoids. ?Genitourinary: Denies dysuria, frequency, urgency, nocturia, hesitancy, discharge, hematuria or flank pain. ?Musculoskeletal: Denies arthralgias, myalgias, stiffness, jt. swelling, pain, limping or strain/sprain.  ?Skin: Denies pruritus, rash, hives, warts, acne, eczema or change in skin lesion(s). ?Neuro: No weakness, tremor, incoordination, spasms, paresthesia or pain. ?Psychiatric: Denies confusion, memory loss or sensory loss. ?Endo: Denies change in weight, skin or hair change.  ?Heme/Lymph: No excessive bleeding, bruising or enlarged lymph nodes. ? ?Physical Exam ? ?BP 140/70   Pulse 70   Temp 97.8 ?F (36.6 ?C)   Resp 16   Ht '5\' 3"'$  (1.6 m)   Wt 157 lb 6.4 oz (71.4 kg)   SpO2 96%   BMI 27.88 kg/m?  ? ?Appears  well nourished, well groomed  and in no distress. ? ?Eyes: PERRLA, EOMs, conjunctiva no swelling or erythema. ?Sinuses: No frontal/maxillary tenderness ?ENT/Mouth: EAC's  clear, TM's nl w/o erythema, bulging. Nares clear w/o erythema, swelling, exudates. Oropharynx clear without erythema or exudates. Oral hygiene is good. Tongue normal, non obstructing. Hearing intact.  ?Neck: Supple. Thyroid not palpable. Car 2+/2+ without bruits, nodes or JVD. ?Chest: Respirations nl with BS clear & equal w/o rales, rhonchi, wheezing or stridor.  ?  Cor: Heart sounds normal w/ regular rate and rhythm without sig. murmurs, gallops, clicks or rubs. Peripheral pulses normal and equal  without edema.  ?Abdomen: Soft & bowel sounds normal. Non-tender w/o guarding, rebound, hernias, masses or organomegaly.  ?Lymphatics: Unremarkable.  ?Musculoskeletal: Full ROM all peripheral extremities, joint stability, 5/5 strength and normal gait.  ?Skin: Warm, dry without exposed rashes, lesions or ecchymosis apparent.  ?Neuro: Cranial nerves intact, reflexes equal bilaterally. Sensory-motor testing grossly intact. Tendon reflexes grossly intact.  ?Pysch: Alert & oriented x 3.  Insight and judgement nl & appropriate. No ideations. ? ?Assessment and Plan: ? ?1. Essential hypertension ? ?- Continue medication, monitor blood pressure at home.  ?- Continue DASH diet.  Reminder to go to the ER if any CP,  ?SOB, nausea, dizziness, severe HA, changes vision/speech. ?   ?- CBC with Differential/Platelet ?- COMPLETE METABOLIC PANEL WITH GFR ?- Magnesium ?- TSH ? ?2. Hyperlipidemia associated with type 2 diabetes mellitus ([place]) ? ?- Continue diet/meds, exercise,& lifestyle modifications.  ?- Continue monitor periodic cholesterol/liver & renal functions  ? ?- TSH ? ?3. Type 2 diabetes mellitus with stage 2 chronic kidney  ?disease, without long-term current use of insulin ([place]) ? ?- Continue diet, exercise  ?- Lifestyle modifications.  ?- Monitor appropriate labs ?  ?- Hemoglobin A1c ?- Insulin, random ? ?4. Vitamin D deficiency ? ?- Continue supplementation ?    ?- VITAMIN D 25 Hydroxy  ? ?5. Depression, major, recurrent, in  partial remission ([place]) ? ?- Increasing Sertraline  -> 100 mg  ? ?- TSH ? ?6. Medication management ? ?- CBC with Differential/Platelet ?- COMPLETE METABOLIC PANEL WITH GFR ?- Magnesium ?- Lipid panel ?- TSH ?

## 2021-10-02 ENCOUNTER — Ambulatory Visit (INDEPENDENT_AMBULATORY_CARE_PROVIDER_SITE_OTHER): Payer: HMO | Admitting: Internal Medicine

## 2021-10-02 ENCOUNTER — Encounter: Payer: Self-pay | Admitting: Internal Medicine

## 2021-10-02 VITALS — BP 140/70 | HR 70 | Temp 97.8°F | Resp 16 | Ht 63.0 in | Wt 157.4 lb

## 2021-10-02 DIAGNOSIS — E785 Hyperlipidemia, unspecified: Secondary | ICD-10-CM

## 2021-10-02 DIAGNOSIS — M545 Low back pain, unspecified: Secondary | ICD-10-CM | POA: Diagnosis not present

## 2021-10-02 DIAGNOSIS — G8929 Other chronic pain: Secondary | ICD-10-CM | POA: Diagnosis not present

## 2021-10-02 DIAGNOSIS — Z79899 Other long term (current) drug therapy: Secondary | ICD-10-CM

## 2021-10-02 DIAGNOSIS — M25512 Pain in left shoulder: Secondary | ICD-10-CM

## 2021-10-02 DIAGNOSIS — N182 Chronic kidney disease, stage 2 (mild): Secondary | ICD-10-CM

## 2021-10-02 DIAGNOSIS — E1169 Type 2 diabetes mellitus with other specified complication: Secondary | ICD-10-CM | POA: Diagnosis not present

## 2021-10-02 DIAGNOSIS — M25511 Pain in right shoulder: Secondary | ICD-10-CM

## 2021-10-02 DIAGNOSIS — E559 Vitamin D deficiency, unspecified: Secondary | ICD-10-CM

## 2021-10-02 DIAGNOSIS — I10 Essential (primary) hypertension: Secondary | ICD-10-CM

## 2021-10-02 DIAGNOSIS — F3341 Major depressive disorder, recurrent, in partial remission: Secondary | ICD-10-CM

## 2021-10-02 DIAGNOSIS — E1122 Type 2 diabetes mellitus with diabetic chronic kidney disease: Secondary | ICD-10-CM | POA: Diagnosis not present

## 2021-10-02 MED ORDER — DEXAMETHASONE 2 MG PO TABS: ORAL_TABLET | ORAL | 0 refills | Status: DC

## 2021-10-02 MED ORDER — SERTRALINE HCL 100 MG PO TABS: ORAL_TABLET | ORAL | 3 refills | Status: DC

## 2021-10-03 LAB — HEMOGLOBIN A1C
Hgb A1c MFr Bld: 6.1 % of total Hgb — ABNORMAL HIGH (ref <5.7)
Mean Plasma Glucose: 128 mg/dL
eAG (mmol/L): 7.1 mmol/L

## 2021-10-03 LAB — TSH: TSH: 1.05 mIU/L (ref 0.40–4.50)

## 2021-10-03 LAB — CBC WITH DIFFERENTIAL/PLATELET
Absolute Monocytes: 637 cells/uL (ref 200–950)
Basophils Absolute: 30 cells/uL (ref 0–200)
Basophils Relative: 0.5 %
Eosinophils Absolute: 384 cells/uL (ref 15–500)
Eosinophils Relative: 6.5 %
HCT: 38 % (ref 35.0–45.0)
Hemoglobin: 11.9 g/dL (ref 11.7–15.5)
Lymphs Abs: 1770 cells/uL (ref 850–3900)
MCH: 24.9 pg — ABNORMAL LOW (ref 27.0–33.0)
MCHC: 31.3 g/dL — ABNORMAL LOW (ref 32.0–36.0)
MCV: 79.5 fL — ABNORMAL LOW (ref 80.0–100.0)
MPV: 9.5 fL (ref 7.5–12.5)
Monocytes Relative: 10.8 %
Neutro Abs: 3080 cells/uL (ref 1500–7800)
Neutrophils Relative %: 52.2 %
Platelets: 178 10*3/uL (ref 140–400)
RBC: 4.78 10*6/uL (ref 3.80–5.10)
RDW: 15.3 % — ABNORMAL HIGH (ref 11.0–15.0)
Total Lymphocyte: 30 %
WBC: 5.9 10*3/uL (ref 3.8–10.8)

## 2021-10-03 LAB — COMPLETE METABOLIC PANEL WITH GFR
AG Ratio: 1.9 (calc) (ref 1.0–2.5)
ALT: 31 U/L — ABNORMAL HIGH (ref 6–29)
AST: 28 U/L (ref 10–35)
Albumin: 4.4 g/dL (ref 3.6–5.1)
Alkaline phosphatase (APISO): 70 U/L (ref 37–153)
BUN: 10 mg/dL (ref 7–25)
CO2: 28 mmol/L (ref 20–32)
Calcium: 9.9 mg/dL (ref 8.6–10.4)
Chloride: 106 mmol/L (ref 98–110)
Creat: 0.81 mg/dL (ref 0.60–1.00)
Globulin: 2.3 g/dL (calc) (ref 1.9–3.7)
Glucose, Bld: 103 mg/dL — ABNORMAL HIGH (ref 65–99)
Potassium: 5 mmol/L (ref 3.5–5.3)
Sodium: 143 mmol/L (ref 135–146)
Total Bilirubin: 0.8 mg/dL (ref 0.2–1.2)
Total Protein: 6.7 g/dL (ref 6.1–8.1)
eGFR: 76 mL/min/{1.73_m2} (ref >=60)

## 2021-10-03 LAB — LIPID PANEL
Cholesterol: 151 mg/dL (ref <200)
HDL: 46 mg/dL — ABNORMAL LOW (ref >=50)
LDL Cholesterol (Calc): 78 mg/dL (calc)
Non-HDL Cholesterol (Calc): 105 mg/dL (calc) (ref <130)
Total CHOL/HDL Ratio: 3.3 (calc) (ref <5.0)
Triglycerides: 178 mg/dL — ABNORMAL HIGH (ref <150)

## 2021-10-03 LAB — INSULIN, RANDOM: Insulin: 15.1 u[IU]/mL

## 2021-10-03 LAB — VITAMIN D 25 HYDROXY (VIT D DEFICIENCY, FRACTURES): Vit D, 25-Hydroxy: 68 ng/mL (ref 30–100)

## 2021-10-03 LAB — MAGNESIUM: Magnesium: 1.9 mg/dL (ref 1.5–2.5)

## 2021-10-03 NOTE — Progress Notes (Signed)
<><><><><><><><><><><><><><><><><><><><><><><><><><><><><><><><><> ?<><><><><><><><><><><><><><><><><><><><><><><><><><><><><><><><><> ?-   Test results slightly outside the reference range are not unusual. ?If there is anything important, I will review this with you,  ?otherwise it is considered normal test values.  ?If you have further questions,  ?please do not hesitate to contact me at the office or via My Chart.  ?<><><><><><><><><><><><><><><><><><><><><><><><><><><><><><><><><> ?<><><><><><><><><><><><><><><><><><><><><><><><><><><><><><><><><> ? ?-  Total Chol = 151   &  Bad LDL Chol = 78   -   Both  Excellent  ? ?- Very low risk for Heart Attack  / Stroke ?============================================================ ?============================================================ ? ?-  Triglycerides (  178   ) or fats in blood are slightly high  ?               (  Ideal or Goal is less than 150)   ? ?- Recommend avoid fried & greasy foods,  sweets / candy,  ? ?- Avoid white rice  ?(brown or wild rice or Quinoa is OK),  ? ?- Avoid white potatoes  ?(sweet potatoes are OK)  ? ?- Avoid anything made from white flour  ?- bagels, doughnuts, rolls, buns, biscuits, white and  ? ?wheat breads, pizza crust and traditional  ?pasta made of white flour & egg white ? ?- (vegetarian pasta or spinach or wheat pasta is OK).   ? ?- Multi-grain bread is OK - like multi-grain flat bread or ? sandwich thins.  ? ?- Avoid alcohol in excess.  ? ?- Exercise is also important. ?<><><><><><><><><><><><><><><><><><><><><><><><><><><><><><><><><> ? ?-  A1c = 6.1% Blood sugar and A1c are elevated STILL in the borderline and  ?                                                            early or pre-diabetes range which has the same  ? ?300% increased risk for heart attack, stroke, cancer and  ?                                              alzheimer- type vascular dementia as full blown diabetes.  ? ?But the good news is that diet, exercise  with weight loss  ?                                                                         can cure the early diabetes at this point. ?<><><><><><><><><><><><><><><><><><><><><><><><><><><><><><><><><> ? ?-  Vitamin D = 68   - Excellent   - Please keep dose  the same ?<><><><><><><><><><><><><><><><><><><><><><><><><><><><><><><><><> ? ?-  All Else - CBC - Kidneys - Electrolytes - Liver - Magnesium & Thyroid   ? ?- all  Normal / OK ?===========================================================  ?===========================================================  ? ?- Keep up the [place] Work ! ? ?===========================================================  ?===========================================================  ? ? ? ? ? ? ? ? ? ? ? ? ? ?

## 2021-10-08 ENCOUNTER — Other Ambulatory Visit: Payer: Self-pay | Admitting: Internal Medicine

## 2021-10-08 DIAGNOSIS — R0989 Other specified symptoms and signs involving the circulatory and respiratory systems: Secondary | ICD-10-CM

## 2021-10-09 ENCOUNTER — Telehealth: Payer: Self-pay

## 2021-10-09 NOTE — Telephone Encounter (Signed)
LM-10/09/21-Calling pt. At [phone] to complete program transfer from [facility]CM to [address]. Unable to reach pt. Unable to LVM, VM box is full. ? ?Total time spent: 2 min. ?

## 2021-10-11 NOTE — Telephone Encounter (Signed)
LM-10/11/21-Calling pt. To complete [facility] to [facility] transfer. Spoke to pt. And completed transfer. Informed pt. To continue following up w/ Dr. Melford Aase as scheduled and to reach out to [place] if any new health concerns arise. Pt. Verbalized understanding and agreed. ? ?Total time spent: 4 min. ?

## 2021-11-09 ENCOUNTER — Other Ambulatory Visit: Payer: Self-pay | Admitting: Internal Medicine

## 2021-11-09 DIAGNOSIS — Z1231 Encounter for screening mammogram for malignant neoplasm of breast: Secondary | ICD-10-CM

## 2021-11-28 ENCOUNTER — Other Ambulatory Visit: Payer: Self-pay | Admitting: Internal Medicine

## 2021-11-28 ENCOUNTER — Other Ambulatory Visit: Payer: Self-pay

## 2021-11-28 DIAGNOSIS — I10 Essential (primary) hypertension: Secondary | ICD-10-CM

## 2021-11-28 MED ORDER — OLMESARTAN MEDOXOMIL 40 MG PO TABS: ORAL_TABLET | ORAL | 3 refills | Status: DC

## 2021-11-29 ENCOUNTER — Other Ambulatory Visit: Payer: Self-pay | Admitting: Nurse Practitioner

## 2021-11-29 ENCOUNTER — Other Ambulatory Visit: Payer: Self-pay | Admitting: Internal Medicine

## 2021-11-29 DIAGNOSIS — R0989 Other specified symptoms and signs involving the circulatory and respiratory systems: Secondary | ICD-10-CM

## 2021-11-29 DIAGNOSIS — N3281 Overactive bladder: Secondary | ICD-10-CM

## 2021-12-10 DIAGNOSIS — H18513 Endothelial corneal dystrophy, bilateral: Secondary | ICD-10-CM | POA: Diagnosis not present

## 2021-12-10 DIAGNOSIS — H26493 Other secondary cataract, bilateral: Secondary | ICD-10-CM | POA: Diagnosis not present

## 2021-12-10 DIAGNOSIS — E113291 Type 2 diabetes mellitus with mild nonproliferative diabetic retinopathy without macular edema, right eye: Secondary | ICD-10-CM | POA: Diagnosis not present

## 2021-12-10 DIAGNOSIS — H52203 Unspecified astigmatism, bilateral: Secondary | ICD-10-CM | POA: Diagnosis not present

## 2021-12-10 LAB — HM DIABETES EYE EXAM

## 2021-12-17 NOTE — Progress Notes (Unsigned)
PATIENT: Michele [name] DOB: January 19, 1946  REASON FOR VISIT: follow up HISTORY FROM: patient Primary neurologist: Dr. Brett Fairy  HISTORY OF PRESENT ILLNESS: Today 12/17/21:  12/12/20: Michele [name] is a 76 year old female with a history of obstructive sleep apnea on CPAP.  She returns today for follow-up.  She denies any new issues with the CPAP.  She reports that she still has some daytime fatigue but reports that she is very active throughout the day.  They mow 7 yards and they have a garden that they maintain.  Patient also does a lot of canning vegetables.  She returns today for an evaluation.    12/13/19: Michele [name] is a 76 year old female with a history of obstructive sleep apnea on CPAP.  Her download indicates that she used her machine 29 out of 30 days for compliance of 97%.  She used her machine greater than 4 hours each night.  On average she uses her machine 5 hours and 44 minutes.  Her residual AHI is 1.7 on 5 to 10 cm of water with EPR 3.  Leak in the 95th percentile is 24.1 L/min.  HISTORY 12/10/18 :   Michele [name] is a 76 year old female with a history of obstructive sleep apnea on CPAP.  Her download indicates that she use her machine nightly for compliance of 100%.  She used her machine greater than 4 hours 29 days for compliance of 97%.  On average she uses her machine 6 hours and 8 minutes.  Her residual AHI is 2.5 on 5-10 cmH2O with EPR of 3.  Her leak in the 95th percentile is 27.6 L/min.  She reports that some mornings she will wake up with a headache however she feels that this is irritation from wearing the mask and straps.  She reports that she is not interested in changing the style of mask at this time.  REVIEW OF SYSTEMS: Out of a complete 14 system review of symptoms, the patient complains only of the following symptoms, and all other reviewed systems are negative.  FSS 38 ESS 4  ALLERGIES: Allergies  Allergen Reactions   Lipitor [Atorvastatin]      Elevates LFT's    HOME MEDICATIONS: Outpatient Medications Prior to Visit  Medication Sig Dispense Refill   acetaminophen (TYLENOL) 500 MG tablet Take 500 mg by mouth 3 (three) times daily as needed for moderate pain.     ALPRAZolam (XANAX) 0.5 MG tablet TAKE 1 TABLET BY MOUTH TWICE DAILY AS NEEDED FOR ANXIETY , LIMIT TO 5 DAYS A WEEK 30 tablet 0   aspirin 325 MG tablet Take 325 mg by mouth daily.     atenolol (TENORMIN) 50 MG tablet TAKE 1 TO 2 TABLETS BY MOUTH IN THE MORNING FOR BLOOD PRESSURE. 180 tablet 1   blood glucose meter kit and supplies KIT Check blood sugar 1 time daily-DX-E11.22 1 each 0   blood glucose meter kit and supplies Test sugar once a day. 1 each 0   Blood Glucose Monitoring Suppl DEVI Test sugars once daily.DX diabetes 1 each 0   Calcium Carbonate (CALCIUM 600 PO) Take 1,200 Units by mouth daily.     Cholecalciferol (VITAMIN D PO) Take 10,000 Units by mouth. 2 times a week no W and F     cloNIDine (CATAPRES) 0.2 MG tablet Take      1 tablet       3 x /day        for BP. Take if BP > 160 270 tablet  1   Cyanocobalamin (VITAMIN B-12 PO) Take 1 tablet by mouth daily. 5000 mc one time in the AM     dexamethasone (DECADRON) 2 MG tablet Take 1 tab 3 x day - 3 days, then 2 x day - 3 days, then 1 tab daily for back & shoulder pains 20 tablet 0   glucose blood test strip Test  Blood Sugar  Daily  (Dx:  e11.29  )) 100 each 3   Lancets MISC Test blood sugar once daily 100 each 11   Magnesium 500 MG TABS Take 1 tablet by mouth 3 (three) times daily.     Menaquinone-7 (VITAMIN K2 PO) Take 1 tablet by mouth daily. (Patient not taking: Reported on 10/02/2021)     metFORMIN (GLUCOPHAGE) 500 MG tablet TAKE TWO TABLETS BY MOUTH EVERY MORNING and TAKE TWO TABLETS BY MOUTH EVERY EVENING FOR DIABETES 360 tablet 1   minoxidil (LONITEN) 10 MG tablet Take  1 tablet  Daily for BP                                       /                                                TAKE                       BY                           MOUTH 90 tablet 3   olmesartan (BENICAR) 40 MG tablet Take 1 tablet every Evening for BP 90 tablet 3   Omega-3 Fatty Acids (FISH OIL PO) Take 1 capsule by mouth daily. 3 TIMES PER WEEK IN THE AM     oxybutynin (DITROPAN-XL) 10 MG 24 hr tablet Take  1 tablet  Daily for Bladder Control                                              /                          TAKE                       BY                       MOUTH 90 tablet 3   rosuvastatin (CRESTOR) 20 MG tablet Take  1 tablet  Daily for Cholesterol                                                 /                               TAKE  BY              MOUTH     (?) ONCE DAILY 90 tablet 0   sertraline (ZOLOFT) 100 MG tablet Take  1 tablet  Daily  for Mood & Chronic Anxiety 90 tablet 3   verapamil (CALAN-SR) 240 MG CR tablet Take  1 tablet  Daily  with Food for BP & Heart Rhythm 90 tablet 3   zinc gluconate 50 MG tablet Take 50 mg by mouth daily. In the AM     No facility-administered medications prior to visit.    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   Sleep apnea    Tubulovillous adenoma of colon    Type II or unspecified type diabetes mellitus without mention of complication, not stated as uncontrolled     PAST SURGICAL HISTORY: Past Surgical History:  Procedure Laterality Date   APPENDECTOMY     BREAST BIOPSY Right    CARPAL TUNNEL RELEASE Right 1992   cataract extration     COLONOSCOPY     CYST REMOVAL HAND     TONSILLECTOMY  1952    FAMILY HISTORY: Family History  Problem Relation Age of Onset   Heart disease Mother    Diabetes Mother    Colon polyps Mother     Heart disease Father    Diabetes Father    Heart attack Father    Heart disease Brother    Heart attack Brother    Cerebral aneurysm Sister    Heart disease Brother        Stent   Colon cancer Neg Hx    Esophageal cancer Neg Hx    Stomach cancer Neg Hx    Rectal cancer Neg Hx    Breast cancer Neg Hx     SOCIAL HISTORY: Social History   Socioeconomic History   Marital status: Married    Spouse name: Trilby Drummer   Number of children: 1   Years of education: 12   Highest education level: Not on file  Occupational History   Occupation: Retired    Fish farm manager: [employer]  Tobacco Use   Smoking status: Never   Smokeless tobacco: Never  Vaping Use   Vaping Use: Never used  Substance and Sexual Activity   Alcohol use: No   Drug use: No   Sexual activity: Never    Partners: Male    Comment: Married  Other Topics Concern   Not on file  Social History Narrative   Lives with husband.  Independent of ADLs. Retired but stays active with [employer] part time.   Right-handed   Caffeine: about 1 cup of coffee per day, tea and Cokes a few times per week   Social Determinants of Health   Financial Resource Strain: Not on file  Food Insecurity: Not on file  Transportation Needs: No Transportation Needs (04/13/2021)   PRAPARE - Hydrologist (Medical): No    Lack of Transportation (Non-Medical): No  Physical Activity: Not on file  Stress: Not on file  Social Connections: Not on file  Intimate Partner Violence: Not on file      PHYSICAL EXAM  There were no vitals filed for this visit.  There is no height or weight on file to calculate BMI.  Generalized: Well developed, in no acute distress  Chest: Lungs clear to auscultation bilaterally  Neurological examination  Mentation: Alert oriented to time, place, history taking. Follows all commands speech and language fluent Cranial nerve II-XII: Extraocular movements were full, visual field  were full on confrontational test Head turning and shoulder shrug  were normal and symmetric. Motor: The motor testing reveals 5 over 5 strength of all 4 extremities. Good symmetric motor tone is noted throughout.  Sensory: Sensory testing is intact to soft touch on all 4 extremities. No evidence of extinction is noted.  Gait and station: Gait is normal.    DIAGNOSTIC DATA (LABS, IMAGING, TESTING) - I reviewed patient records, labs, notes, testing and imaging myself where available.  Lab Results  Component Value Date   WBC 5.9 10/02/2021   HGB 11.9 10/02/2021   HCT 38.0 10/02/2021   MCV 79.5 (L) 10/02/2021   PLT 178 10/02/2021      Component Value Date/Time   NA 143 10/02/2021 1057   K 5.0 10/02/2021 1057   CL 106 10/02/2021 1057   CO2 28 10/02/2021 1057   GLUCOSE 103 (H) 10/02/2021 1057   BUN 10 10/02/2021 1057   CREATININE 0.81 10/02/2021 1057   CALCIUM 9.9 10/02/2021 1057   PROT 6.7 10/02/2021 1057   ALBUMIN 4.6 01/09/2017 1730   AST 28 10/02/2021 1057   ALT 31 (H) 10/02/2021 1057   ALKPHOS 66 01/09/2017 1730   BILITOT 0.8 10/02/2021 1057   GFRNONAA 68 09/28/2020 1021   GFRAA 79 09/28/2020 1021   Lab Results  Component Value Date   CHOL 151 10/02/2021   HDL 46 (L) 10/02/2021   LDLCALC 78 10/02/2021   TRIG 178 (H) 10/02/2021   CHOLHDL 3.3 10/02/2021   Lab Results  Component Value Date   HGBA1C 6.1 (H) 10/02/2021   Lab Results  Component Value Date   [id number] 027 02/16/2016   Lab Results  Component Value Date   TSH 1.05 10/02/2021      ASSESSMENT AND PLAN 76 y.o. year old female  has a past medical history of Anemia, Anxiety, Arthritis, Carpal tunnel syndrome, Cerebrovascular disease (01/02/2017), Duodenal diverticulum, Elevated uric acid in blood, Esophageal motility disorder, Fatty liver, GERD (gastroesophageal reflux disease), Hyperlipidemia, Hypertension, Lymphocytic colitis (09/22/12), Sleep apnea, Tubulovillous adenoma of colon, and Type II or  unspecified type diabetes mellitus without mention of complication, not stated as uncontrolled. here with:  OSA on CPAP  - CPAP compliance excellent - Good treatment of AHI  - Encourage patient to use CPAP nightly and > 4 hours each night - F/U in 1 year or sooner if needed   Ward Givens, MSN, NP-C 12/17/2021, 9:27 AM [facility] [address], [place], [place] [address] [phone]

## 2021-12-18 ENCOUNTER — Ambulatory Visit: Payer: PPO | Admitting: Adult Health

## 2021-12-18 ENCOUNTER — Encounter: Payer: Self-pay | Admitting: Adult Health

## 2021-12-18 VITALS — BP 137/68 | HR 61 | Ht 63.0 in | Wt 149.2 lb

## 2021-12-18 DIAGNOSIS — Z9989 Dependence on other enabling machines and devices: Secondary | ICD-10-CM

## 2021-12-18 DIAGNOSIS — G4733 Obstructive sleep apnea (adult) (pediatric): Secondary | ICD-10-CM

## 2021-12-26 ENCOUNTER — Encounter: Payer: Self-pay | Admitting: Internal Medicine

## 2021-12-28 DIAGNOSIS — H26492 Other secondary cataract, left eye: Secondary | ICD-10-CM | POA: Diagnosis not present

## 2022-01-02 ENCOUNTER — Ambulatory Visit: Payer: Self-pay | Admitting: Nurse Practitioner

## 2022-01-07 NOTE — Progress Notes (Unsigned)
Assessment and Plan:  There are no diagnoses linked to this encounter.    Further disposition pending results of labs. Discussed med's effects and SE's.   Over 30 minutes of exam, counseling, chart review, and critical decision making was performed.   Future Appointments  Date Time Provider [place]  01/08/2022 11:00 AM Alycia Rossetti, NP [facility] None  01/14/2022 11:30 AM Darrol Jump, NP [facility] None  01/22/2022 10:20 AM [facility] MM 2 [facility]  04/18/2022  2:00 PM [name], MD [facility] None  07/05/2022 10:00 AM Alycia Rossetti, NP [facility] None  01/27/2023 10:30 AM Ward Givens, NP [facility] None    ------------------------------------------------------------------------------------------------------------------   HPI There were no vitals taken for this visit. 76 y.o.female presents for  Past Medical History:  Diagnosis Date   Anemia    Anxiety    Arthritis    Carpal tunnel syndrome    Cerebrovascular disease 01/02/2017   Duodenal diverticulum    Elevated uric acid in blood    Esophageal motility disorder    Fatty liver    GERD (gastroesophageal reflux disease)    Hyperlipidemia    Hypertension    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   Sleep apnea    Tubulovillous adenoma of colon    Type II or unspecified type diabetes mellitus without mention of complication, not stated as uncontrolled      Allergies  Allergen Reactions   Lipitor [Atorvastatin]     Elevates LFT's    Current Outpatient Medications on File Prior to Visit  Medication Sig   acetaminophen (TYLENOL) 500 MG tablet Take 500 mg by mouth 3 (three) times daily as needed for moderate pain.   ALPRAZolam (XANAX) 0.5 MG tablet TAKE 1 TABLET BY MOUTH TWICE DAILY AS NEEDED FOR ANXIETY , LIMIT TO 5 DAYS A WEEK (Patient not taking: Reported on 12/18/2021)   aspirin 325 MG tablet Take 325 mg by mouth daily.   atenolol (TENORMIN) 50 MG tablet TAKE 1 TO 2 TABLETS BY MOUTH IN THE  MORNING FOR BLOOD PRESSURE.   blood glucose meter kit and supplies KIT Check blood sugar 1 time daily-DX-E11.22   blood glucose meter kit and supplies Test sugar once a day.   Blood Glucose Monitoring Suppl DEVI Test sugars once daily.DX diabetes   Calcium Carbonate (CALCIUM 600 PO) Take 1,200 Units by mouth daily.   Cholecalciferol (VITAMIN D PO) Take 10,000 Units by mouth. 2 times a week no W and F   cloNIDine (CATAPRES) 0.2 MG tablet Take      1 tablet       3 x /day        for BP. Take if BP > 160   Cyanocobalamin (VITAMIN B-12 PO) Take 1 tablet by mouth daily. 5000 mc one time in the AM   dexamethasone (DECADRON) 2 MG tablet Take 1 tab 3 x day - 3 days, then 2 x day - 3 days, then 1 tab daily for back & shoulder pains (Patient not taking: Reported on 12/18/2021)   glucose blood test strip Test  Blood Sugar  Daily  (Dx:  e11.29  ))   Lancets MISC Test blood sugar once daily   Magnesium 500 MG TABS Take 1 tablet by mouth daily.   Menaquinone-7 (VITAMIN K2 PO) Take 1 tablet by mouth daily.   metFORMIN (GLUCOPHAGE) 500 MG tablet TAKE TWO TABLETS BY MOUTH EVERY MORNING and TAKE TWO TABLETS BY MOUTH EVERY EVENING FOR DIABETES   minoxidil (LONITEN) 10 MG tablet Take  1 tablet  Daily for BP                                       /                                                TAKE                       BY                          MOUTH   olmesartan (BENICAR) 40 MG tablet Take 1 tablet every Evening for BP   Omega-3 Fatty Acids (FISH OIL PO) Take 1 capsule by mouth daily. 3 TIMES PER WEEK IN THE AM (Patient not taking: Reported on 12/18/2021)   oxybutynin (DITROPAN-XL) 10 MG 24 hr tablet Take  1 tablet  Daily for Bladder Control                                              /                          TAKE                       BY                       MOUTH   rosuvastatin (CRESTOR) 20 MG tablet Take  1 tablet  Daily for Cholesterol                                                 /                                TAKE                   BY              MOUTH     (?) ONCE DAILY   sertraline (ZOLOFT) 100 MG tablet Take  1 tablet  Daily  for Mood & Chronic Anxiety   verapamil (CALAN-SR) 240 MG CR tablet Take  1 tablet  Daily  with Food for BP & Heart Rhythm   zinc gluconate 50 MG tablet Take 50 mg by mouth daily. In the AM   No current facility-administered medications on file prior to visit.    ROS: all negative except above.   Physical Exam:  There were no vitals taken for this visit.  General Appearance: Well nourished, in no apparent distress. Eyes: PERRLA, EOMs, conjunctiva no swelling or erythema Sinuses: No Frontal/maxillary tenderness ENT/Mouth: Ext aud canals clear, TMs without erythema, bulging. No erythema, swelling, or exudate on post pharynx.  Tonsils not swollen or erythematous. Hearing normal.  Neck: Supple, thyroid normal.  Respiratory: Respiratory effort normal,  BS equal bilaterally without rales, rhonchi, wheezing or stridor.  Cardio: RRR with no MRGs. Brisk peripheral pulses without edema.  Abdomen: Soft, + BS.  Non tender, no guarding, rebound, hernias, masses. Lymphatics: Non tender without lymphadenopathy.  Musculoskeletal: Full ROM, 5/5 strength, normal gait.  Skin: Warm, dry without rashes, lesions, ecchymosis.  Neuro: Cranial nerves intact. Normal muscle tone, no cerebellar symptoms. Sensation intact.  Psych: Awake and oriented X 3, normal affect, Insight and Judgment appropriate.     Alycia Rossetti, NP 2:01 PM [facility]

## 2022-01-08 ENCOUNTER — Ambulatory Visit (INDEPENDENT_AMBULATORY_CARE_PROVIDER_SITE_OTHER): Payer: HMO | Admitting: Nurse Practitioner

## 2022-01-08 ENCOUNTER — Ambulatory Visit
Admission: RE | Admit: 2022-01-08 | Discharge: 2022-01-08 | Disposition: A | Discharge status: Home / Self Care | Payer: HMO | Source: Ambulatory Visit | Attending: Nurse Practitioner | Admitting: Nurse Practitioner

## 2022-01-08 ENCOUNTER — Telehealth: Payer: Self-pay | Admitting: Pharmacy Technician

## 2022-01-08 ENCOUNTER — Encounter: Payer: Self-pay | Admitting: Nurse Practitioner

## 2022-01-08 VITALS — BP 158/78 | HR 58 | Temp 97.9°F | Ht 63.0 in | Wt 149.6 lb

## 2022-01-08 DIAGNOSIS — I10 Essential (primary) hypertension: Secondary | ICD-10-CM | POA: Diagnosis not present

## 2022-01-08 DIAGNOSIS — M7731 Calcaneal spur, right foot: Secondary | ICD-10-CM | POA: Diagnosis not present

## 2022-01-08 DIAGNOSIS — M79671 Pain in right foot: Secondary | ICD-10-CM

## 2022-01-08 DIAGNOSIS — M7989 Other specified soft tissue disorders: Secondary | ICD-10-CM | POA: Diagnosis not present

## 2022-01-08 MED ORDER — DEXAMETHASONE 4 MG PO TABS: ORAL_TABLET | ORAL | 0 refills | Status: DC

## 2022-01-08 MED ORDER — MELOXICAM 7.5 MG PO TABS: 7.5000 mg | ORAL_TABLET | Freq: Every day | ORAL | 0 refills | Status: AC

## 2022-01-08 NOTE — Patient Instructions (Signed)
Xray of right foot and ankle is ordered [facility] [address] Walk In 8:30-3:45  Take Dexamethasone and Meloxicam as directed  Heel Spur  A heel spur is a bony growth that forms on the bottom of the heel bone (calcaneus). Heel spurs are common. They often cause inflammation in the plantar fascia, which is the band of tissue that connects the toe bones to the heel bone. When the plantar fascia is inflamed, it is called plantar fasciitis. This may cause pain on the bottom of the foot, near the heel. Many people with plantar fasciitis also have heel spurs. However, spurs are not the cause of plantar fasciitis pain. What are the causes? The exact cause of heel spurs is not known. They may be caused by: Pressure on the heel bone. Bands of tissues that connect muscle to bone (tendons) pulling on the heel bone. What increases the risk? You are more likely to develop this condition if you: Are older than age 15. Are overweight. Have wear-and-tear arthritis (osteoarthritis). Have plantar fascia inflammation. Participate in sports or activities that include a lot of running or jumping. Wear poorly fitted shoes. What are the signs or symptoms? Some people have no symptoms. If you do have symptoms, they may include: Pain in the bottom of your heel. Pain that is worse when you first get out of bed. Pain that gets worse after walking or standing. How is this diagnosed? This condition may be diagnosed based on: Your symptoms and medical history. A physical exam. A foot X-ray. How is this treated? Treatment for this condition depends on how much pain you have. Treatment options may include: Doing stretching exercises and losing weight, if necessary. Wearing specific shoes or inserts inside of shoes (orthotics) for comfort and support. Wearing splints on your feet while you sleep. Splints keep your feet in a position (usually a 90-degree angle) that should prevent and relieve the  pain you feel when you first get out of bed. They also make stretching easier in the morning. Taking over-the-counter medicine to relieve pain, such as NSAIDs. Using high-intensity sound waves to break up the heel spur (extracorporeal shock wave therapy). Getting steroid injections in your heel to reduce inflammation. Having surgery, if your heel spur causes long-term (chronic) pain. Follow these instructions at home:  Activity Avoid activities that cause pain until you recover, or for as long as told by your health care provider. Do stretching exercises as told. Stretch before exercising or being physically active. Managing pain, stiffness, and swelling If directed, put ice on your foot. To do this: Put ice in a plastic bag. Place a towel between your skin and the bag. Leave the ice on for 20 minutes, 2-3 times a day. Remove the ice if your skin turns bright red. This is very important. If you cannot feel pain, heat, or cold, you have a greater risk of damage to the area. Move your toes often to reduce stiffness and swelling. When possible, raise (elevate) your foot above the level of your heart while you are sitting or lying down. General instructions Take over-the-counter and prescription medicines only as told by your health care provider. Wear supportive shoes that fit well. Wear splints, inserts, or orthotics as told by your health care provider. If recommended, work with your health care provider to lose weight. This can relieve pressure on your foot. Do not use any products that contain nicotine or tobacco, such as cigarettes, e-cigarettes, and chewing tobacco. These can delay  bone healing. If you need help quitting, ask your health care provider. Keep all follow-up visits. This is important. Where to find more information [employer]: www.orthoinfo.aaos.org Contact a health care provider if: Your pain does not go away with treatment. Your pain gets  worse. Summary A heel spur is a bony growth that forms on the bottom of the heel bone (calcaneus). Heel spurs often cause inflammation in the plantar fascia, which is the band of tissue that connects the toes to the heel bone. This may cause pain on the bottom of the foot, near the heel. Doing stretching exercises, losing weight, wearing specific shoes or shoe inserts, wearing splints while you sleep, and taking pain medicine may ease the pain and stiffness. Other treatment options may include high-intensity sound waves to break up the heel spur, steroid injections, or surgery. This information is not intended to replace advice given to you by your health care provider. Make sure you discuss any questions you have with your health care provider. Document Revised: 09/14/2019 Document Reviewed: 09/14/2019 Elsevier Patient Education  [place].

## 2022-01-09 ENCOUNTER — Other Ambulatory Visit: Payer: Self-pay | Admitting: Nurse Practitioner

## 2022-01-09 DIAGNOSIS — M7750 Other enthesopathy of unspecified foot: Secondary | ICD-10-CM

## 2022-01-11 DIAGNOSIS — H26491 Other secondary cataract, right eye: Secondary | ICD-10-CM | POA: Diagnosis not present

## 2022-01-14 ENCOUNTER — Ambulatory Visit (INDEPENDENT_AMBULATORY_CARE_PROVIDER_SITE_OTHER): Payer: HMO | Admitting: Nurse Practitioner

## 2022-01-14 ENCOUNTER — Encounter: Payer: Self-pay | Admitting: Nurse Practitioner

## 2022-01-14 VITALS — BP 150/60 | HR 53 | Temp 97.7°F | Ht 63.0 in | Wt 153.0 lb

## 2022-01-14 DIAGNOSIS — Z79899 Other long term (current) drug therapy: Secondary | ICD-10-CM | POA: Diagnosis not present

## 2022-01-14 DIAGNOSIS — K760 Fatty (change of) liver, not elsewhere classified: Secondary | ICD-10-CM | POA: Diagnosis not present

## 2022-01-14 DIAGNOSIS — N182 Chronic kidney disease, stage 2 (mild): Secondary | ICD-10-CM

## 2022-01-14 DIAGNOSIS — M7731 Calcaneal spur, right foot: Secondary | ICD-10-CM

## 2022-01-14 DIAGNOSIS — E1169 Type 2 diabetes mellitus with other specified complication: Secondary | ICD-10-CM | POA: Diagnosis not present

## 2022-01-14 DIAGNOSIS — E559 Vitamin D deficiency, unspecified: Secondary | ICD-10-CM

## 2022-01-14 DIAGNOSIS — K219 Gastro-esophageal reflux disease without esophagitis: Secondary | ICD-10-CM | POA: Diagnosis not present

## 2022-01-14 DIAGNOSIS — E785 Hyperlipidemia, unspecified: Secondary | ICD-10-CM | POA: Diagnosis not present

## 2022-01-14 DIAGNOSIS — L80 Vitiligo: Secondary | ICD-10-CM

## 2022-01-14 DIAGNOSIS — E1122 Type 2 diabetes mellitus with diabetic chronic kidney disease: Secondary | ICD-10-CM

## 2022-01-14 DIAGNOSIS — I10 Essential (primary) hypertension: Secondary | ICD-10-CM

## 2022-01-14 DIAGNOSIS — F3341 Major depressive disorder, recurrent, in partial remission: Secondary | ICD-10-CM

## 2022-01-14 NOTE — Progress Notes (Signed)
3 MONTH FOLLOW UP  Assessment:    Type 2 diabetes mellitus with stage 2 chronic kidney disease, without long-term current use of insulin (HCC) Continue Metformin Monitor BG while taking steroids. Education: Reviewed 'ABCs' of diabetes management  Discussed goals to be met and/or maintained include A1C (<7) Blood pressure (<130/80) Cholesterol (LDL <70) Continue Eye Exam yearly  Continue Dental Exam Q6 mo Discussed dietary recommendations Discussed Physical Activity recommendations Foot exam UTD Check A1C  Hyperlipidemia associated with type 2 diabetes mellitus (HCC) Continue Rosuvastatin Discussed lifestyle modifications. Recommended diet heavy in fruits and veggies, omega 3's. Decrease consumption of animal meats, cheeses, and dairy products. Remain active and exercise as tolerated. Continue to monitor. Check lipids/TSH   CKD stage 2 due to type 2 diabetes mellitus (HCC) Continue ARB, bASA, statin Discussed how what you eat and drink can aide in kidney protection. Stay well hydrated. Avoid high salt foods. Avoid NSAIDS. Keep BP and BG well controlled.   Take medications as prescribed. Remain active and exercise as tolerated daily. Maintain weight.  Continue to monitor. Check CMP/GFR/Microablumin   Essential hypertension Continue Clonidine PRN, Minoxidil, Olmesartan Discussed DASH (Dietary Approaches to Stop Hypertension) DASH diet is lower in sodium than a typical American diet. Cut back on foods that are high in saturated fat, cholesterol, and trans fats. Eat more whole-grain foods, fish, poultry, and nuts Remain active and exercise as tolerated daily.  Monitor BP at home-Call if greater than 130/80.  Check CMP/CBC   Vitamin D deficiency Continue supplement  Fatty liver Monitor LFTs, check CMP Weight loss by low processed [place] diet advised  Overweight (BMI 27.0-27.9) Discussed appropriate BMI Goal of losing 1 lb per month. Diet  modification. Physical activity. Encouraged/praised to build confidence.   Gastroesophageal reflux disease, unspecified whether esophagitis present No suspected reflux complications ([place]/stricture). Lifestyle modification:  wt loss, avoid meals 2-3h before bedtime. Consider eliminating food triggers:  chocolate, caffeine, EtOH, acid/spicy food.   Vitiligo Monitor  Depression, major, recurrent, in partial remission (HCC)/ anxiety Continue medications, Sertraline, Verapamil Discussed stress management techniques discussed, increase water, good sleep hygiene discussed, increase exercise, and increase veggies.  Continue to monitor   Medication Management All medications discussed and reviewed in full. All questions and concerns regarding medications addressed.    Calcaneal spur of right foot Continue Meloxicam and steroid. May start bASA after oral therapy for pain completed. Continue insert. Continue f/u with Orthopedics 01/30/22  Orders Placed This Encounter  Procedures   CBC with Differential/Platelet   COMPLETE METABOLIC PANEL WITH GFR   Lipid panel   Hemoglobin A1c   VITAMIN D 25 Hydroxy (Vit-D Deficiency, Fractures)     Over 30 minutes of exam, counseling, chart review and critical decision making was performed Future Appointments  Date Time Provider [place]  01/22/2022 10:20 AM [facility] MM 2 [facility]  01/30/2022  9:45 AM Mcarthur Rossetti, MD [facility] None  04/18/2022  2:00 PM [name], MD [facility] None  07/05/2022 10:00 AM Alycia Rossetti, NP [facility] None  01/27/2023 10:30 AM Ward Givens, NP [facility] None      Subjective:  Michele [name] is a 76 y.o. female who presents for 3 month follow up. She has Hyperlipidemia associated with type 2 diabetes mellitus ([place]); Essential hypertension; GERD; Fatty liver; COLONIC POLYPS, ADENOMATOUS, HX OF; CKD stage 2 due to type 2 diabetes mellitus ([place]); Vitamin D deficiency; History  of hyperthyroidism; Cerebrovascular disease; Type 2 diabetes mellitus ([place]); Overweight (BMI 25.0-29.9); Vitiligo; Sleep apnea on  CPAP; Anxiety; Depression, major, recurrent, in partial remission ([place]); Osteopenia; and Lumbar spondylosis on their problem list.  She was seen last month for evaluation of heel pain which resulted int a bone spur.  She has been treated with meloxicam and steroid.  This has helped.  She is wearing an insert.  She continues to have pain in her lower back and hip but states it is not as bad as previous. She continues to have some occasional numbness and tingling. She has persistent right great toe toe nail fungus.  Used Lamisil in the past but nail fungus has persisted.  It is not causing any pain.   She will follow with orthopedics on 01/30/2022.  She had a diabetic eye exam completed on 7/10/203. She reports having had recent laser surgery, she will follow up in two weeks.  Her depression is currently well controlled.  She is staying active by helping her husband mow yards.    BMI is Body mass index is 27.1 kg/m., she has not been working on diet and exercise but has garden and mows 6 yards, active and outdoors most of the day.  Wt Readings from Last 3 Encounters:  01/14/22 153 lb (69.4 kg)  01/08/22 149 lb 9.6 oz (67.9 kg)  12/18/21 149 lb 3.2 oz (67.7 kg)    Her blood pressure has been controlled at home, renal US 2014,  she is on benicar 34m, verapamil 240, minoxidil 10 mg , she will take clonidine 0.1 mg if her systolic is above 1086 will do this once a week or less often, today their BP is BP: (!) 150/60. She took Clonidine last night BP Readings from Last 3 Encounters:  01/14/22 (!) 150/60  01/08/22 (!) 158/78  12/18/21 137/68     She does workout (works on a farm) She denies chest pain, shortness of breath, dizziness.    She has been working on diet and exercise for T2 diabetes With CKD II on olmesartan, is on ASA With hyperlipidemia crestor 20 mg M, W, S  and denies myalgias,  at goal less than 70  well controlled with metformin with recent A1C diabetic range Fasting runs 100-110's denies increased appetite, nausea, paresthesia of the feet, polydipsia, polyuria, visual disturbances and vomiting.  Last A1C in the office was:  Lab Results  Component Value Date   HGBA1C 6.1 (H) 10/02/2021   She is on Rosuvastatin and is not having any myalgias. Lab Results  Component Value Date   CHOL 151 10/02/2021   HDL 46 (L) 10/02/2021   LDLCALC 78 10/02/2021   TRIG 178 (H) 10/02/2021   CHOLHDL 3.3 10/02/2021   Lab Results  Component Value Date   GFRNONAA 68 09/28/2020   Patient is on Vitamin D supplement, taking 10000 IU daily:    Lab Results  Component Value Date   VD25OH 68 10/02/2021         Medication Review:  Current Outpatient Medications (Endocrine & Metabolic):    dexamethasone (DECADRON) 4 MG tablet, Take 3 tabs for 3 days, 2 tabs for 3 days 1 tab for 5 days. Take with food.   metFORMIN (GLUCOPHAGE) 500 MG tablet, TAKE TWO TABLETS BY MOUTH EVERY MORNING and TAKE TWO TABLETS BY MOUTH EVERY EVENING FOR DIABETES  Current Outpatient Medications (Cardiovascular):    atenolol (TENORMIN) 50 MG tablet, TAKE 1 TO 2 TABLETS BY MOUTH IN THE MORNING FOR BLOOD PRESSURE.   cloNIDine (CATAPRES) 0.2 MG tablet, Take      1 tablet  3 x /day        for BP. Take if BP > 160   minoxidil (LONITEN) 10 MG tablet, Take  1 tablet  Daily for BP                                       /                                                TAKE                       BY                          MOUTH   olmesartan (BENICAR) 40 MG tablet, Take 1 tablet every Evening for BP   rosuvastatin (CRESTOR) 20 MG tablet, Take  1 tablet  Daily for Cholesterol                                                 /                               TAKE                   BY              MOUTH     (?) ONCE DAILY   verapamil (CALAN-SR) 240 MG CR tablet, Take  1 tablet  Daily  with Food  for BP & Heart Rhythm   Current Outpatient Medications (Analgesics):    acetaminophen (TYLENOL) 500 MG tablet, Take 500 mg by mouth 3 (three) times daily as needed for moderate pain.   meloxicam (MOBIC) 7.5 MG tablet, Take 1 tablet (7.5 mg total) by mouth daily for 10 days.   aspirin 325 MG tablet, Take 325 mg by mouth daily. (Patient not taking: Reported on 01/14/2022)  Current Outpatient Medications (Hematological):    Cyanocobalamin (VITAMIN B-12 PO), Take 1 tablet by mouth daily. 5000 mc one time in the AM PRN  Current Outpatient Medications (Other):    blood glucose meter kit and supplies KIT, Check blood sugar 1 time daily-DX-E11.22   blood glucose meter kit and supplies, Test sugar once a day.   Blood Glucose Monitoring Suppl DEVI, Test sugars once daily.DX diabetes   Cholecalciferol (VITAMIN D PO), Take 10,000 Units by mouth. 2 times a week no W and F   glucose blood test strip, Test  Blood Sugar  Daily  (Dx:  e11.29  ))   Lancets MISC, Test blood sugar once daily   oxybutynin (DITROPAN-XL) 10 MG 24 hr tablet, Take  1 tablet  Daily for Bladder Control                                              /  TAKE                       BY                       MOUTH   sertraline (ZOLOFT) 100 MG tablet, Take  1 tablet  Daily  for Mood & Chronic Anxiety   Multiple Minerals (CALCIUM/MAGNESIUM/ZINC PO), Take by mouth. (Patient not taking: Reported on 01/14/2022)   Current Problems (verified) Patient Active Problem List   Diagnosis Date Noted   Lumbar spondylosis 01/01/2021   Osteopenia 06/08/2020   Depression, major, recurrent, in partial remission ([place]) 11/02/2019   Anxiety 12/10/2018   Sleep apnea on CPAP 09/18/2018   Vitiligo 11/09/2017   Type 2 diabetes mellitus ([place]) 07/24/2017   Overweight (BMI 25.0-29.9) 07/24/2017   Cerebrovascular disease 01/02/2017   History of hyperthyroidism 09/11/2015   Vitamin D deficiency 06/28/2013   CKD stage 2 due to type 2  diabetes mellitus ([place])    Hyperlipidemia associated with type 2 diabetes mellitus ([place]) 03/21/2008   Essential hypertension 03/21/2008   GERD 03/21/2008   Fatty liver 03/21/2008   COLONIC POLYPS, ADENOMATOUS, HX OF 03/21/2008   Allergies Allergies  Allergen Reactions   Lipitor [Atorvastatin]     Elevates LFT's   Immunization History  Administered Date(s) Administered   Influenza Split 03/25/2013   Influenza, High Dose Seasonal PF 03/15/2014, 03/07/2015, 03/27/2016, 04/21/2017, 02/18/2018, 03/11/2019, 03/20/2020, 03/30/2021   PFIZER(Purple Top)SARS-COV-2 Vaccination 06/24/2019, 07/15/2019, 03/08/2020   Pneumococcal Conjugate-13 01/26/2014   Pneumococcal Polysaccharide-23 03/14/2011   Td 03/25/2013   Zoster, Live 06/03/2005   Health Maintenance  Topic Date Due   Zoster Vaccines- Shingrix (1 of 2) Never done   COVID-19 Vaccine (4 - Pfizer risk series) 05/03/2020   INFLUENZA VACCINE  01/01/2022   FOOT EXAM  03/29/2022   HEMOGLOBIN A1C  04/04/2022   OPHTHALMOLOGY EXAM  12/11/2022   COLONOSCOPY (Pts 45-36yr Insurance coverage will need to be confirmed)  02/11/2023   TETANUS/TDAP  03/26/2023   Pneumonia Vaccine 76 Years old  Completed   DEXA SCAN  Completed   Hepatitis C Screening  Completed   HPV VACCINES  Aged Out      Vision: Dr. MEllie Lunch- last 11/2020- no retinopathy Dental: Dr. SClaudie Leach last 2022  Patient Care Team: MUnk Pinto MD as PCP - General (Internal Medicine) ERush [facility] Pharmacist (Pharmacist)   SURGICAL HISTORY She  has a past surgical history that includes Appendectomy; Tonsillectomy (1952); Carpal tunnel release (Right, 1992); Cyst removal hand; cataract extration; Colonoscopy; and Breast biopsy (Right). FAMILY HISTORY Her family history includes Cerebral aneurysm in her sister; Colon polyps in her mother; Diabetes in her father and mother; Heart attack in her brother and father; Heart disease in her brother, brother, father, and  mother. SOCIAL HISTORY She  reports that she has never smoked. She has never used smokeless tobacco. She reports that she does not drink alcohol and does not use drugs.   Review of Systems  Constitutional:  Negative for chills, fever, malaise/fatigue and weight loss.  HENT:  Negative for congestion, ear pain, hearing loss and tinnitus.   Eyes:  Negative for blurred vision and double vision.  Respiratory:  Negative for cough, shortness of breath and wheezing.   Cardiovascular:  Negative for chest pain, palpitations, orthopnea, claudication and leg swelling.  Gastrointestinal:  Negative for abdominal pain, blood in stool, constipation, diarrhea, heartburn, melena, nausea and vomiting.  Genitourinary: Negative.  Musculoskeletal:  Positive for back pain (lumbar R) and joint pain (R hip). Negative for falls and myalgias.  Skin:  Negative for rash.  Neurological:  Positive for tingling (of feet bilaterally). Negative for dizziness, tremors, sensory change, speech change, focal weakness, seizures, loss of consciousness, weakness and headaches.  Endo/Heme/Allergies:  Negative for polydipsia.  Psychiatric/Behavioral:  Negative for depression, memory loss, substance abuse and suicidal ideas. The patient does not have insomnia.   All other systems reviewed and are negative.   Objective:     Today's Vitals   01/14/22 1116  BP: (!) 150/60  Pulse: (!) 53  Temp: 97.7 F (36.5 C)  SpO2: 98%  Weight: 153 lb (69.4 kg)  Height: _0  (1.6 m)    Body mass index is 27.1 kg/m. General Appearance: Well nourished, in no apparent distress. Eyes: conjunctiva no swelling or erythema ENT/Mouth: No erythema, swelling, or exudate on post pharynx.  Tonsils not swollen or erythematous. Hearing normal.  Neck: Supple Respiratory: Respiratory effort normal, BS equal bilaterally without rales, rhonchi, wheezing or stridor.  Cardio: RRR with no holosystolic murmur. Brisk peripheral pulses without edema.   Abdomen: Soft, + BS.  Non tender, no guarding, rebound, hernias, masses. Lymphatics: no palpable lymphadenopathy Musculoskeletal: Symmetrical strength, normal gait. She has lumbar pain with ROM limited in all spheres, neg straight leg raise, L hip ROM normal, R with guarding secondary to lumbar/gluteal pain, no crepitus or popping. No tenderness  Skin: Warm, dry without rashes, lesions, ecchymosis. Irregular patchy brown discoloration throughout. Bilateral big toe nails with thickening, brittle and yellow, no erythema. Normal at bases.  Neuro: Cranial nerves intact. Normal muscle tone, no cerebellar symptoms. Sensation intact.  Psych: Awake and oriented X 3, normal affect, Insight and Judgment appropriate.     Darrol Jump, NP   01/14/2022

## 2022-01-14 NOTE — Patient Instructions (Signed)

## 2022-01-15 LAB — LIPID PANEL
Cholesterol: 169 mg/dL (ref <200)
HDL: 62 mg/dL (ref >=50)
LDL Cholesterol (Calc): 76 mg/dL (calc)
Non-HDL Cholesterol (Calc): 107 mg/dL (calc) (ref <130)
Total CHOL/HDL Ratio: 2.7 (calc) (ref <5.0)
Triglycerides: 213 mg/dL — ABNORMAL HIGH (ref <150)

## 2022-01-15 LAB — CBC WITH DIFFERENTIAL/PLATELET
Absolute Monocytes: 398 cells/uL (ref 200–950)
Basophils Absolute: 8 cells/uL (ref 0–200)
Basophils Relative: 0.1 %
Eosinophils Absolute: 17 cells/uL (ref 15–500)
Eosinophils Relative: 0.2 %
HCT: 36.7 % (ref 35.0–45.0)
Hemoglobin: 11.9 g/dL (ref 11.7–15.5)
Lymphs Abs: 1403 cells/uL (ref 850–3900)
MCH: 25.6 pg — ABNORMAL LOW (ref 27.0–33.0)
MCHC: 32.4 g/dL (ref 32.0–36.0)
MCV: 78.9 fL — ABNORMAL LOW (ref 80.0–100.0)
MPV: 9.4 fL (ref 7.5–12.5)
Monocytes Relative: 4.8 %
Neutro Abs: 6474 cells/uL (ref 1500–7800)
Neutrophils Relative %: 78 %
Platelets: 185 10*3/uL (ref 140–400)
RBC: 4.65 10*6/uL (ref 3.80–5.10)
RDW: 15.8 % — ABNORMAL HIGH (ref 11.0–15.0)
Total Lymphocyte: 16.9 %
WBC: 8.3 10*3/uL (ref 3.8–10.8)

## 2022-01-15 LAB — COMPLETE METABOLIC PANEL WITH GFR
AG Ratio: 1.7 (calc) (ref 1.0–2.5)
ALT: 55 U/L — ABNORMAL HIGH (ref 6–29)
AST: 43 U/L — ABNORMAL HIGH (ref 10–35)
Albumin: 4 g/dL (ref 3.6–5.1)
Alkaline phosphatase (APISO): 48 U/L (ref 37–153)
BUN: 20 mg/dL (ref 7–25)
CO2: 26 mmol/L (ref 20–32)
Calcium: 9.6 mg/dL (ref 8.6–10.4)
Chloride: 102 mmol/L (ref 98–110)
Creat: 0.97 mg/dL (ref 0.60–1.00)
Globulin: 2.3 g/dL (calc) (ref 1.9–3.7)
Glucose, Bld: 126 mg/dL — ABNORMAL HIGH (ref 65–99)
Potassium: 4.2 mmol/L (ref 3.5–5.3)
Sodium: 140 mmol/L (ref 135–146)
Total Bilirubin: 1 mg/dL (ref 0.2–1.2)
Total Protein: 6.3 g/dL (ref 6.1–8.1)
eGFR: 61 mL/min/{1.73_m2} (ref >=60)

## 2022-01-15 LAB — VITAMIN D 25 HYDROXY (VIT D DEFICIENCY, FRACTURES): Vit D, 25-Hydroxy: 47 ng/mL (ref 30–100)

## 2022-01-15 LAB — HEMOGLOBIN A1C
Hgb A1c MFr Bld: 5.9 % of total Hgb — ABNORMAL HIGH (ref <5.7)
Mean Plasma Glucose: 123 mg/dL
eAG (mmol/L): 6.8 mmol/L

## 2022-01-22 ENCOUNTER — Ambulatory Visit: Payer: PPO

## 2022-01-29 ENCOUNTER — Ambulatory Visit
Admission: RE | Admit: 2022-01-29 | Discharge: 2022-01-29 | Disposition: A | Discharge status: Home / Self Care | Payer: HMO | Source: Ambulatory Visit | Attending: Internal Medicine | Admitting: Internal Medicine

## 2022-01-29 DIAGNOSIS — Z1231 Encounter for screening mammogram for malignant neoplasm of breast: Secondary | ICD-10-CM

## 2022-01-30 ENCOUNTER — Ambulatory Visit (INDEPENDENT_AMBULATORY_CARE_PROVIDER_SITE_OTHER): Payer: HMO | Admitting: Orthopaedic Surgery

## 2022-01-30 DIAGNOSIS — M722 Plantar fascial fibromatosis: Secondary | ICD-10-CM

## 2022-01-30 DIAGNOSIS — M79671 Pain in right foot: Secondary | ICD-10-CM

## 2022-01-30 MED ORDER — LIDOCAINE HCL 1 % IJ SOLN
1.0000 mL | INTRAMUSCULAR | Status: AC | PRN
  Administered 2022-01-30: 1 mL

## 2022-01-30 MED ORDER — MELOXICAM 15 MG PO TABS: 15.0000 mg | ORAL_TABLET | Freq: Every day | ORAL | 3 refills | Status: DC | PRN

## 2022-01-30 MED ORDER — METHYLPREDNISOLONE ACETATE 40 MG/ML IJ SUSP
40.0000 mg | INTRAMUSCULAR | Status: AC | PRN
  Administered 2022-01-30: 40 mg

## 2022-01-30 NOTE — Progress Notes (Signed)
Office Visit Note   Patient: Michele [name]           Date of Birth: 1946-03-28           MRN: [id number] Visit Date: 01/30/2022              Requested by: Alycia Rossetti, NP [address] [place],  [place] [address] PCP: [name], MD   Assessment & Plan: Visit Diagnoses:  1. Pain of right heel   2. Plantar fasciitis of right foot     Plan: I did speak with her in length in detail about plantar fasciitis.  She has not had any type of injections I did recommend a steroid injection over the point of maximal tenderness in her calcaneus and she agreed to this and tolerated well.  I showed her stretching exercises to try and I need her to do these in the morning in the evening as well as place Voltaren gel on her foot.  She can do this twice a day.  This is over-the-counter.  I did send in some more meloxicam.  I recommended she avoid sandals and closed back shoes for the next few months and mainly wear well fitting firm tennis shoes.  We can always repeat an injection if needed.  I can see her back in 6 weeks.  If she is still having back issues and wants this work-up we can obtain x-rays of her lumbar spine.  Follow-Up Instructions: Return if symptoms worsen or fail to improve.   Orders:  Orders Placed This Encounter  Procedures   Foot Inj   No orders of the defined types were placed in this encounter.     Procedures: Foot Inj  Date/Time: 01/30/2022 9:55 AM  Performed by: Mcarthur Rossetti, MD Authorized by: Mcarthur Rossetti, MD   Condition: Plantar Fasciitis   Location: right plantar fascia muscle   Medications:  1 mL lidocaine 1 %; 40 mg methylPREDNISolone acetate 40 MG/ML    Clinical Data: No additional findings.   Subjective: Chief Complaint  Patient presents with   Right Foot - Pain  The patient is a 76 year old female sent to me from her primary care physician due to right heel pain.  X-rays were obtained that showed a "bone  spur" and a referral was made to orthopedics.  The patient describes pain consistent with planter fasciitis.  Her first up in the morning is very painful when she gets up.  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  This is been going on for a while now.  She is diabetic but has good blood glucose control.  She said a steroid and meloxicam that her primary care physician put her on helped her quite a bit for her foot and her back but then she ran out of that.  She denies any specific injuries.  She denies any numbness and tingling in her feet or deformities of her feet.  HPI  Review of Systems There is currently listed no fever, chills, nausea, vomiting  Objective: Vital Signs: There were no vitals taken for this visit.  Physical Exam She is alert and orient x3 and in no acute distress Ortho Exam Examination of her right foot shows normal foot contours.  She is neurovascular intact.  Her Achilles tendon is intact.  Her pain is over the plantar aspect of her heel but not where the "bone spur" is. Specialty Comments:  No specialty comments  available.  Imaging: No results found. X-rays of the right foot were reviewed and I can see the "bone spur" that is referred to but this is also the site of attachment for the flexor houses longus tendon.  This is a common area to see on most people's feet x-rays.  PMFS History: Patient Active Problem List   Diagnosis Date Noted   Lumbar spondylosis 01/01/2021   Osteopenia 06/08/2020   Depression, major, recurrent, in partial remission ([place]) 11/02/2019   Anxiety 12/10/2018   Sleep apnea on CPAP 09/18/2018   Vitiligo 11/09/2017   Type 2 diabetes mellitus ([place]) 07/24/2017   Overweight (BMI 25.0-29.9) 07/24/2017   Cerebrovascular disease 01/02/2017   History of hyperthyroidism 09/11/2015   Vitamin D deficiency 06/28/2013   CKD stage 2 due to type 2 diabetes mellitus ([place])    Hyperlipidemia associated with type 2 diabetes mellitus ([place])  03/21/2008   Essential hypertension 03/21/2008   GERD 03/21/2008   Fatty liver 03/21/2008   COLONIC POLYPS, ADENOMATOUS, HX OF 03/21/2008   Past Medical History:  Diagnosis Date   Anemia    Anxiety    Arthritis    Carpal tunnel syndrome    Cerebrovascular disease 01/02/2017   Duodenal diverticulum    Elevated uric acid in blood    Esophageal motility disorder    Fatty liver    GERD (gastroesophageal reflux disease)    Hyperlipidemia    Hypertension    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   Sleep apnea    Tubulovillous adenoma of colon    Type II or unspecified type diabetes mellitus without mention of complication, not stated as uncontrolled     Family History  Problem Relation Age of Onset   Heart disease Mother    Diabetes Mother    Colon polyps Mother    Heart disease Father    Diabetes Father    Heart attack Father    Cerebral aneurysm Sister    Heart disease Brother    Heart attack Brother    Heart disease Brother        Stent   Colon cancer Neg Hx    Esophageal cancer Neg Hx    Stomach cancer Neg Hx    Rectal cancer Neg Hx    Breast cancer Neg Hx    Sleep apnea Neg Hx     Past Surgical History:  Procedure Laterality Date   APPENDECTOMY     BREAST BIOPSY Right    CARPAL TUNNEL RELEASE Right 1992   cataract extration     COLONOSCOPY     CYST REMOVAL HAND     TONSILLECTOMY  1952   Social History   Occupational History   Occupation: Retired    Fish farm manager: [employer]  Tobacco Use   Smoking status: Never   Smokeless tobacco: Never  Vaping Use   Vaping Use: Never used  Substance and Sexual Activity   Alcohol use: No   Drug use: No   Sexual activity: Never    Partners: Male    Comment: Married

## 2022-03-11 DIAGNOSIS — L814 Other melanin hyperpigmentation: Secondary | ICD-10-CM | POA: Diagnosis not present

## 2022-03-11 DIAGNOSIS — L281 Prurigo nodularis: Secondary | ICD-10-CM | POA: Diagnosis not present

## 2022-03-11 DIAGNOSIS — L80 Vitiligo: Secondary | ICD-10-CM | POA: Diagnosis not present

## 2022-03-11 DIAGNOSIS — L570 Actinic keratosis: Secondary | ICD-10-CM | POA: Diagnosis not present

## 2022-03-11 DIAGNOSIS — D2239 Melanocytic nevi of other parts of face: Secondary | ICD-10-CM | POA: Diagnosis not present

## 2022-03-11 DIAGNOSIS — D225 Melanocytic nevi of trunk: Secondary | ICD-10-CM | POA: Diagnosis not present

## 2022-03-13 ENCOUNTER — Ambulatory Visit (INDEPENDENT_AMBULATORY_CARE_PROVIDER_SITE_OTHER): Payer: HMO | Admitting: Orthopaedic Surgery

## 2022-03-13 ENCOUNTER — Encounter: Payer: Self-pay | Admitting: Orthopaedic Surgery

## 2022-03-13 DIAGNOSIS — M7541 Impingement syndrome of right shoulder: Secondary | ICD-10-CM

## 2022-03-13 DIAGNOSIS — G8929 Other chronic pain: Secondary | ICD-10-CM

## 2022-03-13 DIAGNOSIS — M5441 Lumbago with sciatica, right side: Secondary | ICD-10-CM

## 2022-03-13 DIAGNOSIS — M722 Plantar fascial fibromatosis: Secondary | ICD-10-CM

## 2022-03-13 NOTE — Progress Notes (Signed)
Office Visit Note   Patient: Michele [name] [name]           Date of Birth: Jan 08, 1946           MRN: [id number] Visit Date: 03/13/2022              Requested by: Michele [name] [name], [place] Michele [name] [name],  [place] [id number] PCP: Michele Pinto, MD   Assessment & Plan: Visit Diagnoses:  1. Plantar fasciitis of right foot   2. Chronic low back pain with right-sided sciatica, unspecified back pain laterality   3. Impingement syndrome of right shoulder     Plan: Discussed formal therapy for back heel and shoulder she defers.  Offered her cortisone injection right shoulder she defers.  Therefore at her request she is given back exercise handouts which are reviewed and also shoulder handouts and Thera-Band which is reviewed with her.  Discussed gastrocsoleus stretching with her also.  Questions were encouraged and answered.  She will follow-up with [place] as needed  Follow-Up Instructions: Return if symptoms worsen or fail to improve.   Orders:  No orders of the defined types were placed in this encounter.  No orders of the defined types were placed in this encounter.     Procedures: No procedures performed   Clinical Data: No additional findings.   Subjective: Chief Complaint  Patient presents with   Right Foot - Follow-up    HPI [name] returns today 6 weeks status post right heel injection for plantar fasciitis.  She states heel is overall doing well.  Also she has been good about her shoe wear.  She is doing some stretching activities.  She feels that the meloxicam is helped.  She is only using Voltaren gel in the heel once daily. She is also having some back pain and asking if she can have some exercises for her back.  She does have some radicular symptoms down into the right thigh but states this has been ongoing for years. Patient notes that she has had right shoulder pain and would like for [place] to look at this today.  She is never noted shoulder pain  before now aware of.  She states the pain is chronic.  Symptomatic treatment for the shoulder.  She is diabetic but states she has good control of her diabetes.  Review of Systems  Constitutional:  Negative for chills and fever.     Objective: Vital Signs: There were no vitals taken for this visit.  Physical Exam Constitutional:      Appearance: She is not ill-appearing or diaphoretic.  Cardiovascular:     Pulses: Normal pulses.  Pulmonary:     Effort: Pulmonary effort is normal.  Neurological:     Mental Status: She is alert and oriented to person, place, and time.  Psychiatric:        Mood and Affect: Mood normal.     Ortho Exam Bilateral shoulders 5 out of 5 strength external and internal rotation against resistance.  Empty can test is negative bilaterally.  Impingement testing positive on the right negative on the left. Lower extremity she has tight hamstrings bilaterally.  Tight gastrocs bilaterally.  Left foot slight tenderness over the medial tubercle of calcaneus.  Remainder of foot is nontender.  She has no rashes skin lesions ulcerations she does have vitiligo. Specialty Comments:  No specialty comments available.  Imaging: No results found.   PMFS History: Patient Active Problem List   Diagnosis Date Noted  Lumbar spondylosis 01/01/2021   Osteopenia 06/08/2020   Depression, major, recurrent, in partial remission ([place]) 11/02/2019   Anxiety 12/10/2018   Sleep apnea on CPAP 09/18/2018   Vitiligo 11/09/2017   Type 2 diabetes mellitus ([place]) 07/24/2017   Overweight (BMI 25.0-29.9) 07/24/2017   Cerebrovascular disease 01/02/2017   History of hyperthyroidism 09/11/2015   Vitamin D deficiency 06/28/2013   CKD stage 2 due to type 2 diabetes mellitus ([place])    Hyperlipidemia associated with type 2 diabetes mellitus ([place]) 03/21/2008   Essential hypertension 03/21/2008   GERD 03/21/2008   Fatty liver 03/21/2008   COLONIC POLYPS, ADENOMATOUS, HX OF 03/21/2008    Past Medical History:  Diagnosis Date   Anemia    Anxiety    Arthritis    Carpal tunnel syndrome    Cerebrovascular disease 01/02/2017   Duodenal diverticulum    Elevated uric acid in blood    Esophageal motility disorder    Fatty liver    GERD (gastroesophageal reflux disease)    Hyperlipidemia    Hypertension    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   Sleep apnea    Tubulovillous adenoma of colon    Type II or unspecified type diabetes mellitus without mention of complication, not stated as uncontrolled     Family History  Problem Relation Age of Onset   Heart disease Mother    Diabetes Mother    Colon polyps Mother    Heart disease Father    Diabetes Father    Heart attack Father    Cerebral aneurysm Sister    Heart disease Brother    Heart attack Brother    Heart disease Brother        Stent   Colon cancer Neg Hx    Esophageal cancer Neg Hx    Stomach cancer Neg Hx    Rectal cancer Neg Hx    Breast cancer Neg Hx    Sleep apnea Neg Hx     Past Surgical History:  Procedure Laterality Date   APPENDECTOMY     BREAST BIOPSY Right    CARPAL TUNNEL RELEASE Right 1992   cataract extration     COLONOSCOPY     CYST REMOVAL HAND     TONSILLECTOMY  1952   Social History   Occupational History   Occupation: Retired    Fish farm manager: [employer]  Tobacco Use   Smoking status: Never   Smokeless tobacco: Never  Vaping Use   Vaping Use: Never used  Substance and Sexual Activity   Alcohol use: No   Drug use: No   Sexual activity: Never    Partners: Male    Comment: Married

## 2022-04-01 ENCOUNTER — Encounter: Payer: Self-pay | Admitting: Internal Medicine

## 2022-04-18 ENCOUNTER — Encounter: Payer: Self-pay | Admitting: Internal Medicine

## 2022-04-18 ENCOUNTER — Ambulatory Visit (INDEPENDENT_AMBULATORY_CARE_PROVIDER_SITE_OTHER): Payer: HMO | Admitting: Internal Medicine

## 2022-04-18 VITALS — BP 130/60 | HR 78 | Temp 97.8°F | Resp 16 | Ht 63.0 in | Wt 153.0 lb

## 2022-04-18 DIAGNOSIS — Z0000 Encounter for general adult medical examination without abnormal findings: Secondary | ICD-10-CM

## 2022-04-18 DIAGNOSIS — Z23 Encounter for immunization: Secondary | ICD-10-CM | POA: Diagnosis not present

## 2022-04-18 DIAGNOSIS — Z0001 Encounter for general adult medical examination with abnormal findings: Secondary | ICD-10-CM

## 2022-04-18 DIAGNOSIS — Z79899 Other long term (current) drug therapy: Secondary | ICD-10-CM

## 2022-04-18 DIAGNOSIS — M109 Gout, unspecified: Secondary | ICD-10-CM

## 2022-04-18 DIAGNOSIS — E1122 Type 2 diabetes mellitus with diabetic chronic kidney disease: Secondary | ICD-10-CM

## 2022-04-18 DIAGNOSIS — Z136 Encounter for screening for cardiovascular disorders: Secondary | ICD-10-CM

## 2022-04-18 DIAGNOSIS — B351 Tinea unguium: Secondary | ICD-10-CM

## 2022-04-18 DIAGNOSIS — K219 Gastro-esophageal reflux disease without esophagitis: Secondary | ICD-10-CM

## 2022-04-18 DIAGNOSIS — E1169 Type 2 diabetes mellitus with other specified complication: Secondary | ICD-10-CM

## 2022-04-18 DIAGNOSIS — G4730 Sleep apnea, unspecified: Secondary | ICD-10-CM

## 2022-04-18 DIAGNOSIS — E559 Vitamin D deficiency, unspecified: Secondary | ICD-10-CM

## 2022-04-18 DIAGNOSIS — I10 Essential (primary) hypertension: Secondary | ICD-10-CM | POA: Diagnosis not present

## 2022-04-18 DIAGNOSIS — F3341 Major depressive disorder, recurrent, in partial remission: Secondary | ICD-10-CM

## 2022-04-18 DIAGNOSIS — Z1211 Encounter for screening for malignant neoplasm of colon: Secondary | ICD-10-CM

## 2022-04-18 MED ORDER — TERBINAFINE HCL 250 MG PO TABS: ORAL_TABLET | ORAL | 0 refills | Status: DC

## 2022-04-18 NOTE — Patient Instructions (Signed)

## 2022-04-18 NOTE — Progress Notes (Signed)
Annual Screening/Preventative Visit & Comprehensive Evaluation &  Examination  Future Appointments  Date Time Provider Department  04/18/2022                     cpe  2:00 PM [name], MD [facility]  07/05/2022                        wellness 10:00 AM Alycia Rossetti, NP [facility]  01/27/2023 10:30 AM Ward Givens, NP [facility]  04/24/2023                      cpe  2:00 PM [name], MD [facility]        This very nice 76 y.o. [name]  presents for a Screening /Preventative Visit & comprehensive evaluation and management of multiple medical co-morbidities.  Patient has been followed for HTN, HLD, T2_NIDDM  and Vitamin D Deficiency.  Patient has hx/o OSA, but was Mask Intolerant. Patient has hx/o Depression in partial remission on her meds.         HTN predates since 78. Patient has long hx/o very labile elevated BP's. Patient denies any cardiac symptoms as chest pain, palpitations, shortness of breath, dizziness or ankle swelling. Today's BP was initially slightly elevated & rechecked at goal - 130/60 .       Patient's hyperlipidemia is controlled with diet and medications. Patient denies myalgias or other medication SE's. Last lipids were at goal except sl  elevated Trig's :  Lab Results  Component Value Date   CHOL 169 01/14/2022   HDL 62 01/14/2022   LDLCALC 76 01/14/2022   TRIG 213 (H) 01/14/2022   CHOLHDL 2.7 01/14/2022         Patient has hx/o T2_NIDDM 2006) /CKD2 (GFR 69) and patient denies reactive hypoglycemic symptoms, visual blurring, diabetic polys or paresthesias. Last A1c was near goal :  Lab Results  Component Value Date   HGBA1C 5.9 (H) 01/14/2022         Finally, patient has history of Vitamin D Deficiency ("19"/2008)  and last Vitamin D was not at goal (70-100) :  Lab Results  Component Value Date   VD25OH 47 01/14/2022       Current Outpatient Medications  Medication Instructions   acetaminophen   500 mg 3 times daily PRN    Aspirin 325 mg Daily   Atenolol 50 MG tablet TAKE 1 TO 2 TABLETS  IN THE MORNING    VITAMIN D 10,000 Unit 2 times a week no W and F   cloNIDine   0.2 MG tablet Take 1 tablet   3 x /day    VITAMIN B-12 5000 mc  1 tablet Daily   meloxicam   15 mg , Oral, Daily PRN   metFORMIN  500 MG tablet TAKE TWO TABLETS  MORNING and EVENING    minoxidil  10 MG tablet Take  1 tablet  Daily   Multiple Minerals  Take daily   olmesartan 40 MG tablet Take 1 tablet every Evening    DITROPAN-XL) 10 MG 24 hr tablet Take  1 tablet  Daily   rosuvastatin 20 MG tablet Take  1 tablet  Daily    sertraline  100 MG tablet Take  1 tablet  Daily     verapamil  240 MG Take  1 tablet  Daily        Allergies  Allergen Reactions   Lipitor [Atorvastatin]  Elevates LFT's     Past Medical History:  Diagnosis Date   Anemia    Anxiety    Arthritis    Carpal tunnel syndrome    Cerebrovascular disease 01/02/2017   Duodenal diverticulum    Elevated uric acid in blood    Esophageal motility disorder    Fatty liver    GERD (gastroesophageal reflux disease)    Hyperlipidemia    Hypertension    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   Sleep apnea    Tubulovillous adenoma of colon    Type II  diabetes mellitus       Health Maintenance  Topic Date Due   Zoster Vaccines- Shingrix (1 of 2) Never done   COVID-19 Vaccine (4 - Booster for Pfizer series) 05/03/2020   INFLUENZA VACCINE  01/01/2021   FOOT EXAM  02/26/2021   HEMOGLOBIN A1C  06/30/2021   OPHTHALMOLOGY EXAM  11/29/2021   COLONOSCOPY ( 02/11/2023   TETANUS/TDAP  03/26/2023   Pneumonia Vaccine  Completed   DEXA SCAN  Completed   Hepatitis C Screening  Completed   HPV VACCINES  Aged Out     Immunization History  Administered Date(s) Administered   Influenza Split 03/25/2013   Influenza, High Dose  04/21/2017, 02/18/2018, 03/11/2019, 03/20/2020   PFIZER  SARS-COV-2 Vacc 06/24/2019, 07/15/2019, 03/08/2020   Pneumococcal-13 01/26/2014   Pneumococcal-23  03/14/2011   Td 03/25/2013   Zoster, Live 06/03/2005    Last Colon - 09/22/2012 - Dr Delfin Edis - Dx Lymphocytic colitis                     - 02/10/2018 - Colonoscopy - Dr Henrene Pastor - Recc 5 yr f/u due Sept 2024   Last MGM - 01/29/2022   Past Surgical History:  Procedure Laterality Date   APPENDECTOMY     BREAST BIOPSY Right    CARPAL TUNNEL RELEASE Right 1992   cataract extration     COLONOSCOPY     CYST REMOVAL HAND     TONSILLECTOMY  1952     Family History  Problem Relation Age of Onset   Heart disease Mother    Diabetes Mother    Colon polyps Mother    Heart disease Father    Diabetes Father    Heart attack Father    Heart disease Brother    Heart attack Brother    Cerebral aneurysm Sister    Heart disease Brother        Stent   Colon cancer Neg Hx    Esophageal cancer Neg Hx    Stomach cancer Neg Hx    Rectal cancer Neg Hx    Breast cancer Neg Hx      Social History   Tobacco Use   Smoking status: Never   Smokeless tobacco: Never  Vaping Use   Vaping Use: Never used  Substance Use Topics   Alcohol use: No   Drug use: No      ROS Constitutional: Denies fever, chills, weight loss/gain, headaches, insomnia,  night sweats, and change in appetite. Does c/o fatigue. Eyes: Denies redness, blurred vision, diplopia, discharge, itchy, watery eyes.  ENT: Denies discharge, congestion, post nasal drip, epistaxis, sore throat, earache, hearing loss, dental pain, Tinnitus, Vertigo, Sinus pain, snoring.  Cardio: Denies chest pain, palpitations, irregular heartbeat, syncope, dyspnea, diaphoresis, orthopnea, PND, claudication, edema Respiratory: denies cough, dyspnea, DOE, pleurisy, hoarseness, laryngitis, wheezing.  Gastrointestinal: Denies dysphagia, heartburn, reflux, water brash, pain, cramps, nausea, vomiting, bloating, diarrhea, constipation, hematemesis, melena,  hematochezia, jaundice, hemorrhoids Genitourinary: Denies dysuria, frequency, urgency, nocturia,  hesitancy, discharge, hematuria, flank pain Breast: Breast lumps, nipple discharge, bleeding.  Musculoskeletal: Denies arthralgia, myalgia, stiffness, Jt. Swelling, pain, limp, and strain/sprain. Denies falls. Skin: Denies puritis, rash, hives, warts, acne, eczema, changing in skin lesion Neuro: No weakness, tremor, incoordination, spasms, paresthesia, pain Psychiatric: Denies confusion, memory loss, sensory loss. Denies Depression. Endocrine: Denies change in weight, skin, hair change, nocturia, and paresthesia, diabetic polys, visual blurring, hyper / hypo glycemic episodes.  Heme/Lymph: No excessive bleeding, bruising, enlarged lymph nodes.  Physical Exam  BP 130/60   Pulse 78   Temp 97.8 F (36.6 C)   Resp 16   Ht '5\' 3"'$  (1.6 m)   Wt 153 lb (69.4 kg)   SpO2 97%   BMI 27.10 kg/m   General Appearance: Well nourished, well groomed and in no apparent distress.  Eyes: PERRLA, EOMs, conjunctiva no swelling or erythema, normal fundi and vessels. Sinuses: No frontal/maxillary tenderness ENT/Mouth: EACs patent / TMs  nl. Nares clear without erythema, swelling, mucoid exudates. Oral hygiene is good. No erythema, swelling, or exudate. Tongue normal, non-obstructing. Tonsils not swollen or erythematous. Hearing normal.  Neck: Supple, thyroid not palpable. No bruits, nodes or JVD. Respiratory: Respiratory effort normal.  BS equal and clear bilateral without rales, rhonci, wheezing or stridor. Cardio: Heart sounds are normal with regular rate and rhythm and no murmurs, rubs or gallops. Peripheral pulses are normal and equal bilaterally without edema. No aortic or femoral bruits. Chest: symmetric with normal excursions and percussion. Breasts: Symmetric, without lumps, nipple discharge, retractions, or fibrocystic changes.  Abdomen: Flat, soft with bowel sounds active. Nontender, no guarding, rebound, hernias, masses, or organomegaly.  Lymphatics: Non tender without lymphadenopathy.   Genitourinary:  Musculoskeletal: Full ROM all peripheral extremities, joint stability, 5/5 strength, and normal gait. Skin: Warm and dry without rashes, lesions, cyanosis, clubbing or  ecchymosis.  Neuro: Cranial nerves intact, reflexes equal bilaterally. Normal muscle tone, no cerebellar symptoms. Sensation intact.  Pysch: Alert and oriented X 3, normal affect, Insight and Judgment appropriate.    Assessment and Plan  1. Annual Preventative Screening Examination   2. Essential hypertension  - EKG 12-Lead - Urinalysis, Routine w reflex microscopic - Microalbumin / creatinine urine ratio - CBC with Differential/Platelet - COMPLETE METABOLIC PANEL WITH GFR - Magnesium - TSH  3. Hyperlipidemia associated with type 2 diabetes mellitus ([place])  - EKG 12-Lead - Lipid panel - TSH  4. Type 2 diabetes mellitus with stage 2 chronic kidney  disease, without long-term current use of insulin ([facility])  - EKG 12-Lead - HM DIABETES FOOT EXAM - LOW EXTREMITY NEUR EXAM DOCUM - Hemoglobin A1c - Insulin, random  5. Vitamin D deficiency  - VITAMIN D 25 Hydroxy   6. Idiopathic gout  - Uric acid  7. Depression, major, recurrent, in partial remission ([facility])  - TSH  8. Gastroesophageal reflux disease  - CBC with Differential/Platelet  9. Screening for colorectal cancer  - POC Hemoccult Bld/Stl  10. Sleep apnea, unspecified type   11. Screening for ischemic heart disease  - EKG 12-Lead  12. Medication management  - Urinalysis, Routine w reflex microscopic - Microalbumin / creatinine urine ratio - CBC with Differential/Platelet - COMPLETE METABOLIC PANEL WITH GFR - Magnesium - Lipid panel - TSH - Hemoglobin A1c - Insulin, random - VITAMIN D 25 Hydroxy           Patient was counseled in prudent diet to achieve/maintain BMI less than 25 for  weight control, BP monitoring, regular exercise and medications. Discussed med's effects and SE's. Screening labs and tests as  requested with regular follow-up as recommended. Over 40 minutes of exam, counseling, chart review and high complex critical decision making was performed.   Kirtland Bouchard, MD

## 2022-04-19 LAB — URINALYSIS, ROUTINE W REFLEX MICROSCOPIC
Bilirubin Urine: NEGATIVE
Glucose, UA: NEGATIVE
Hgb urine dipstick: NEGATIVE
Ketones, ur: NEGATIVE
Leukocytes,Ua: NEGATIVE
Nitrite: NEGATIVE
Protein, ur: NEGATIVE
Specific Gravity, Urine: 1.017 (ref 1.001–1.035)
pH: 5.5 (ref 5.0–8.0)

## 2022-04-19 LAB — COMPLETE METABOLIC PANEL WITH GFR
AG Ratio: 1.7 (calc) (ref 1.0–2.5)
ALT: 43 U/L — ABNORMAL HIGH (ref 6–29)
AST: 57 U/L — ABNORMAL HIGH (ref 10–35)
Albumin: 4.2 g/dL (ref 3.6–5.1)
Alkaline phosphatase (APISO): 67 U/L (ref 37–153)
BUN/Creatinine Ratio: 13 (calc) (ref 6–22)
BUN: 14 mg/dL (ref 7–25)
CO2: 27 mmol/L (ref 20–32)
Calcium: 9.6 mg/dL (ref 8.6–10.4)
Chloride: 105 mmol/L (ref 98–110)
Creat: 1.04 mg/dL — ABNORMAL HIGH (ref 0.60–1.00)
Globulin: 2.5 g/dL (calc) (ref 1.9–3.7)
Glucose, Bld: 157 mg/dL — ABNORMAL HIGH (ref 65–99)
Potassium: 4.1 mmol/L (ref 3.5–5.3)
Sodium: 142 mmol/L (ref 135–146)
Total Bilirubin: 0.8 mg/dL (ref 0.2–1.2)
Total Protein: 6.7 g/dL (ref 6.1–8.1)
eGFR: 56 mL/min/{1.73_m2} — ABNORMAL LOW (ref >=60)

## 2022-04-19 LAB — CBC WITH DIFFERENTIAL/PLATELET
Absolute Monocytes: 566 cells/uL (ref 200–950)
Basophils Absolute: 28 cells/uL (ref 0–200)
Basophils Relative: 0.5 %
Eosinophils Absolute: 151 cells/uL (ref 15–500)
Eosinophils Relative: 2.7 %
HCT: 36.5 % (ref 35.0–45.0)
Hemoglobin: 12 g/dL (ref 11.7–15.5)
Lymphs Abs: 1770 cells/uL (ref 850–3900)
MCH: 26 pg — ABNORMAL LOW (ref 27.0–33.0)
MCHC: 32.9 g/dL (ref 32.0–36.0)
MCV: 79 fL — ABNORMAL LOW (ref 80.0–100.0)
MPV: 9.5 fL (ref 7.5–12.5)
Monocytes Relative: 10.1 %
Neutro Abs: 3086 cells/uL (ref 1500–7800)
Neutrophils Relative %: 55.1 %
Platelets: 194 10*3/uL (ref 140–400)
RBC: 4.62 10*6/uL (ref 3.80–5.10)
RDW: 14.7 % (ref 11.0–15.0)
Total Lymphocyte: 31.6 %
WBC: 5.6 10*3/uL (ref 3.8–10.8)

## 2022-04-19 LAB — TSH: TSH: 1.06 mIU/L (ref 0.40–4.50)

## 2022-04-19 LAB — MICROALBUMIN / CREATININE URINE RATIO
Creatinine, Urine: 133 mg/dL (ref 20–275)
Microalb Creat Ratio: 13 mcg/mg creat (ref <30)
Microalb, Ur: 1.7 mg/dL

## 2022-04-19 LAB — LIPID PANEL
Cholesterol: 136 mg/dL (ref <200)
HDL: 38 mg/dL — ABNORMAL LOW (ref >=50)
LDL Cholesterol (Calc): 71 mg/dL (calc)
Non-HDL Cholesterol (Calc): 98 mg/dL (calc) (ref <130)
Total CHOL/HDL Ratio: 3.6 (calc) (ref <5.0)
Triglycerides: 199 mg/dL — ABNORMAL HIGH (ref <150)

## 2022-04-19 LAB — MAGNESIUM: Magnesium: 1.7 mg/dL (ref 1.5–2.5)

## 2022-04-19 LAB — HEMOGLOBIN A1C
Hgb A1c MFr Bld: 6.4 % of total Hgb — ABNORMAL HIGH (ref <5.7)
Mean Plasma Glucose: 137 mg/dL
eAG (mmol/L): 7.6 mmol/L

## 2022-04-19 LAB — URIC ACID: Uric Acid, Serum: 6.2 mg/dL (ref 2.5–7.0)

## 2022-04-19 LAB — INSULIN, RANDOM: Insulin: 55 u[IU]/mL — ABNORMAL HIGH

## 2022-04-19 LAB — VITAMIN D 25 HYDROXY (VIT D DEFICIENCY, FRACTURES): Vit D, 25-Hydroxy: 57 ng/mL (ref 30–100)

## 2022-04-20 ENCOUNTER — Encounter: Payer: Self-pay | Admitting: Internal Medicine

## 2022-04-20 NOTE — Progress Notes (Signed)
<><><><><><><><><><><><><><><><><><><><><><><><><><><><><><><><><> <><><><><><><><><><><><><><><><><><><><><><><><><><><><><><><><><> - Test results slightly outside the reference range are not unusual. If there is anything important, I will review this with you,  otherwise it is considered normal test values.  If you have further questions,  please do not hesitate to contact me at the office or via My Chart.  <><><><><><><><><><><><><><><><><><><><><><><><><><><><><><><><><> <><><><><><><><><><><><><><><><><><><><><><><><><><><><><><><><><>  -  CBC - is Normal & OK  - CMET shows   -   Glucose = 157 mg% - is too high  ( Ideal or Goal is less than 120 mg%   And A1c = 6.4% also too high   ( Ideal or goal is less than 5.7 %   !   )   ( So please work harder on diet & weight loss  Your blood sugar and A1c are elevated.    Being diabetic has a  300% increased risk for heart attack,  stroke, cancer, and alzheimer- type vascular dementia.   It is very important that you work harder with diet by  avoiding all foods that are white except chicken,   fish & calliflower.  - Avoid white rice  (brown & wild rice is OK),   - Avoid white potatoes  (sweet potatoes in moderation is OK),   White bread or wheat bread or anything made out of   white flour like bagels, donuts, rolls, buns, biscuits, cakes,  - pastries, cookies, pizza crust, and pasta (made from  white flour & egg whites)   - vegetarian pasta or spinach or wheat pasta is OK.  - Multigrain breads like Arnold's, Pepperidge Farm or   multigrain sandwich thins or high fiber breads like   [place] bread or "Dave's Killer" breads that are  4 to 5 grams fiber per slice !  are best.    Diet, exercise and weight loss can reverse and cure  diabetes in the early stages.    - Diet, exercise and weight loss is very important in the   control and prevention of complications of diabetes which  affects every system in your body, ie.    -Brain - dementia/stroke,  - eyes - glaucoma/blindness,  - heart - heart attack/heart failure,  - kidneys - dialysis,  - stomach - gastric paralysis,  - intestines - malabsorption,  - nerves - severe painful neuritis,  - circulation - gangrene & loss of a leg(s)  - and finally  . . . . . . . . . . . . . . . . . .    - cancer and Alzheimers. <><><><><><><><><><><><><><><><><><><><><><><><><><><><><><><><><>  -  Also on CMET,        2 Liver enzymes are even higher,   which is due to "Fatty Liver " & the only treatment is Weight Loss  !  <><><><><><><><><><><><><><><><><><><><><><><><><><><><><><><><><> <><><><><><><><><><><><><><><><><><><><><><><><><><><><><><><><><>  -  Total Chol = 136   &     LDL Chol = 71       -      Both  Excellent   - Very low risk for Heart Attack  / Stroke <><><><><><><><><><><><><><><><><><><><><><><><><><><><><><><><><>  -  But Triglycerides (   199    ) or fats in blood are too high                 (   Ideal or  Goal is less than 150  !  )    - Recommend avoid fried & greasy foods,  sweets / candy,   - Avoid white rice  (brown or wild rice or Quinoa  is OK),   - Avoid white potatoes  (sweet potatoes are OK)   - Avoid anything made from white flour  - bagels, doughnuts, rolls, buns, biscuits, white and   wheat breads, pizza crust and traditional  pasta made of white flour & egg white  - (vegetarian pasta or spinach or wheat pasta is OK).    - Multi-grain bread is OK - like multi-grain flat bread or  sandwich thins.   - Avoid alcohol   - Exercise is also important. <><><><><><><><><><><><><><><><><><><><><><><><><><><><><><><><><> <><><><><><><><><><><><><><><><><><><><><><><><><><><><><><><><><>  -  Vitamin D= 57  - Great  - Please continue dose same  <><><><><><><><><><><><><><><><><><><><><><><><><><><><><><><><><> <><><><><><><><><><><><><><><><><><><><><><><><><><><><><><><><><>  -  Uric Acid / Gout test is OK - Continue  Allopurinol  <><><><><><><><><><><><><><><><><><><><><><><><><><><><><><><><><>  -  All Else - CBC - Kidneys - Electrolytes - Liver - Magnesium & Thyroid    - all  Normal / OK <><><><><><><><><><><><><><><><><><><><><><><><><><><><><><><><><> <><><><><><><><><><><><><><><><><><><><><><><><><><><><><><><><><>

## 2022-05-02 ENCOUNTER — Other Ambulatory Visit: Payer: Self-pay

## 2022-05-02 DIAGNOSIS — N183 Chronic kidney disease, stage 3 unspecified: Secondary | ICD-10-CM

## 2022-05-02 MED ORDER — METFORMIN HCL 500 MG PO TABS: ORAL_TABLET | ORAL | 1 refills | Status: DC

## 2022-05-13 ENCOUNTER — Other Ambulatory Visit: Payer: Self-pay | Admitting: Internal Medicine

## 2022-05-13 DIAGNOSIS — I10 Essential (primary) hypertension: Secondary | ICD-10-CM

## 2022-05-13 MED ORDER — ATENOLOL 50 MG PO TABS: ORAL_TABLET | ORAL | 3 refills | Status: DC

## 2022-05-14 ENCOUNTER — Other Ambulatory Visit: Payer: Self-pay | Admitting: Internal Medicine

## 2022-05-14 DIAGNOSIS — N183 Chronic kidney disease, stage 3 unspecified: Secondary | ICD-10-CM

## 2022-05-14 MED ORDER — METFORMIN HCL 500 MG PO TABS: ORAL_TABLET | ORAL | 3 refills | Status: DC

## 2022-05-16 ENCOUNTER — Other Ambulatory Visit: Payer: Self-pay | Admitting: Internal Medicine

## 2022-05-16 DIAGNOSIS — E1122 Type 2 diabetes mellitus with diabetic chronic kidney disease: Secondary | ICD-10-CM

## 2022-05-16 MED ORDER — METFORMIN HCL ER 500 MG PO TB24: ORAL_TABLET | ORAL | 3 refills | Status: DC
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
## 2022-06-07 ENCOUNTER — Encounter: Payer: Self-pay | Admitting: Nurse Practitioner

## 2022-06-07 ENCOUNTER — Ambulatory Visit (INDEPENDENT_AMBULATORY_CARE_PROVIDER_SITE_OTHER): Payer: PPO | Admitting: Nurse Practitioner

## 2022-06-07 ENCOUNTER — Ambulatory Visit (HOSPITAL_COMMUNITY)
Admission: RE | Admit: 2022-06-07 | Discharge: 2022-06-07 | Disposition: A | Discharge status: Home / Self Care | Payer: PPO | Source: Ambulatory Visit | Attending: Cardiology | Admitting: Cardiology

## 2022-06-07 VITALS — BP 158/68 | HR 64 | Temp 98.6°F | Ht 63.0 in | Wt 159.4 lb

## 2022-06-07 DIAGNOSIS — I10 Essential (primary) hypertension: Secondary | ICD-10-CM

## 2022-06-07 DIAGNOSIS — F3341 Major depressive disorder, recurrent, in partial remission: Secondary | ICD-10-CM

## 2022-06-07 DIAGNOSIS — R600 Localized edema: Secondary | ICD-10-CM | POA: Insufficient documentation

## 2022-06-07 DIAGNOSIS — N183 Chronic kidney disease, stage 3 unspecified: Secondary | ICD-10-CM | POA: Diagnosis not present

## 2022-06-07 DIAGNOSIS — M7989 Other specified soft tissue disorders: Secondary | ICD-10-CM | POA: Diagnosis not present

## 2022-06-07 DIAGNOSIS — M79604 Pain in right leg: Secondary | ICD-10-CM | POA: Diagnosis not present

## 2022-06-07 DIAGNOSIS — E1122 Type 2 diabetes mellitus with diabetic chronic kidney disease: Secondary | ICD-10-CM

## 2022-06-07 MED ORDER — SERTRALINE HCL 100 MG PO TABS: ORAL_TABLET | ORAL | 1 refills | Status: DC

## 2022-06-07 NOTE — Patient Instructions (Signed)
If positive U/S go to ER  Deep Vein Thrombosis  Deep vein thrombosis (DVT) is a condition in which a blood clot forms in a vein of the deep venous system. This can occur in the lower leg, thigh, pelvis, arm, or neck. A clot is blood that has thickened into a gel or solid. This condition is serious and can be life-threatening if the clot travels to the arteries of the lungs and causes a blockage (pulmonary embolism). A DVT can also damage veins in the leg, which can lead to long-term venous disease, leg pain, swelling, discoloration, and ulcers or sores (post-thrombotic syndrome). What are the causes? This condition may be caused by: A slowdown of blood flow. Damage to a vein. A condition that causes blood to clot more easily, such as certain bleeding disorders. What increases the risk? The following factors may make you more likely to develop this condition: Obesity. Being older, especially older than age 10. Being inactive or not moving around (sedentary lifestyle). This may include: Sitting or lying down for longer than 4-6 hours other than to sleep at night. Being in the hospital, or having major or lengthy surgery. Having any recent bone injuries, such as breaks (fractures), that reduce movement, especially in the lower extremities. Having recent orthopedic surgery on the lower extremities. Being pregnant, giving birth, or having recently given birth. Taking medicines that contain estrogen, such as birth control or hormone replacement therapy. Using products that contain nicotine or tobacco, especially if you use hormonal birth control. Having a history of a blood vessel disease (peripheral vascular disease) or congestive heart disease. Having a history of cancer, especially if being treated with chemotherapy. What are the signs or symptoms? Symptoms of this condition include: Swelling, pain, pressure, or tenderness in an arm or a leg. An arm or a leg becoming warm, red, or  discolored. A leg turning very pale or blue. You may have a large DVT. This is rare. If the clot is in your leg, you may notice that symptoms get worse when you stand or walk. In some cases, there are no symptoms. How is this diagnosed? This condition is diagnosed with: Your medical history and a physical exam. Tests, such as: Blood tests to check how well your blood clots. Doppler ultrasound. This is the best way to find a DVT. CT venogram. Contrast dye is injected into a vein, and X-rays are taken to check for clots. This is helpful for veins in the chest or pelvis. How is this treated? Treatment for this condition depends on: The cause of your DVT. The size and location of your DVT, or having more than one DVT. Your risk for bleeding or developing more clots. Other medical conditions you may have. Treatment may include: Taking a blood thinner medicine (anticoagulant) to prevent more clots from forming or current clots from growing. Wearing compression stockings. Injecting medicines into the affected vein to break up the clot (catheter-directed thrombolysis). Surgical procedures, when DVT is severe or hard to treat. These may be done to: Isolate and remove your clot. Place an inferior vena cava (IVC) filter. This filter is placed into a large vein called the inferior vena cava to catch blood clots before they reach your lungs. You may get some medical treatments for 6 months or longer. Follow these instructions at home: If you are taking blood thinners: Talk with your health care provider before you take any medicines that contain aspirin or NSAIDs, such as ibuprofen. These medicines increase your risk  for dangerous bleeding. Take your medicine exactly as told, at the same time every day. Do not skip a dose. Do not take more than the prescribed dose. This is important. Ask your health care provider about foods and medicines that could change or interact with the way your blood thinner  works. Avoid these foods and medicines if you are told to do so. Avoid anything that may cause bleeding or bruising. You may bleed more easily while taking blood thinners. Be very careful when using knives, scissors, or other sharp objects. Use an electric razor instead of a blade. Avoid activities that could cause injury or bruising, and follow instructions for preventing falls. Tell your health care provider if you have had any internal bleeding, bleeding ulcers, or neurologic diseases, such as strokes or cerebral aneurysms. Wear a medical alert bracelet or carry a card that lists what medicines you take. General instructions Take over-the-counter and prescription medicines only as told by your health care provider. Return to your normal activities as told by your health care provider. Ask your health care provider what activities are safe for you. If recommended, wear compression stockings as told by your health care provider. These stockings help to prevent blood clots and reduce swelling in your legs. Never wear your compression stockings while sleeping at night. Keep all follow-up visits. This is important. Where to find more information American Heart Association: www.heart.org Centers for Disease Control and Prevention: http://www.wolf.info/ National Heart, Lung, and Blood Institute: https://wilson-eaton.com/ Contact a health care provider if: You miss a dose of your blood thinner. You have unusual bruising or other color changes. You have new or worse pain, swelling, or redness in an arm or a leg. You have worsening numbness or tingling in an arm or a leg. You have a significant color change (pale or blue) in the extremity that has the DVT. Get help right away if: You have signs or symptoms that a blood clot has moved to the lungs. These may include: Shortness of breath. Chest pain. Fast or irregular heartbeats (palpitations). Light-headedness, dizziness, or fainting. Coughing up blood. You have  signs or symptoms that your blood is too thin. These may include: Blood in your vomit, stool, or urine. A cut that will not stop bleeding. A menstrual period that is heavier than usual. A severe headache or confusion. These symptoms may be an emergency. Get help right away. Call 911. Do not wait to see if the symptoms will go away. Do not drive yourself to the hospital. Summary Deep vein thrombosis (DVT) happens when a blood clot forms in a deep vein. This may occur in the lower leg, thigh, pelvis, arm, or neck. Symptoms affect the arm or leg and can include swelling, pain, tenderness, warmth, redness, or discoloration. This condition may be treated with medicines. In severe cases, a procedure or surgery may be done to remove or dissolve the clots. If you are taking blood thinners, take them exactly as told. Do not skip a dose. Do not take more than is prescribed. Get help right away if you have a severe headache, shortness of breath, chest pain, fast or irregular heartbeats, or blood in your vomit, urine, or stool. This information is not intended to replace advice given to you by your health care provider. Make sure you discuss any questions you have with your health care provider. Document Revised: 12/11/2020 Document Reviewed: 12/11/2020 Elsevier Patient Education  [place].

## 2022-06-10 ENCOUNTER — Other Ambulatory Visit: Payer: Self-pay

## 2022-06-10 ENCOUNTER — Encounter: Payer: Self-pay | Admitting: Internal Medicine

## 2022-06-10 ENCOUNTER — Emergency Department: Payer: PPO

## 2022-06-10 ENCOUNTER — Inpatient Hospital Stay
Admission: EM | Admit: 2022-06-10 | Discharge: 2022-06-14 | DRG: 866 | Disposition: A | Discharge status: Home / Self Care | Payer: PPO | Attending: Internal Medicine | Admitting: Internal Medicine

## 2022-06-10 DIAGNOSIS — D61818 Other pancytopenia: Secondary | ICD-10-CM | POA: Diagnosis not present

## 2022-06-10 DIAGNOSIS — R195 Other fecal abnormalities: Secondary | ICD-10-CM | POA: Diagnosis not present

## 2022-06-10 DIAGNOSIS — G8929 Other chronic pain: Secondary | ICD-10-CM | POA: Diagnosis present

## 2022-06-10 DIAGNOSIS — F418 Other specified anxiety disorders: Secondary | ICD-10-CM | POA: Diagnosis present

## 2022-06-10 DIAGNOSIS — B349 Viral infection, unspecified: Secondary | ICD-10-CM | POA: Diagnosis not present

## 2022-06-10 DIAGNOSIS — G4733 Obstructive sleep apnea (adult) (pediatric): Secondary | ICD-10-CM | POA: Diagnosis present

## 2022-06-10 DIAGNOSIS — R001 Bradycardia, unspecified: Secondary | ICD-10-CM | POA: Diagnosis not present

## 2022-06-10 DIAGNOSIS — F3341 Major depressive disorder, recurrent, in partial remission: Secondary | ICD-10-CM | POA: Diagnosis present

## 2022-06-10 DIAGNOSIS — E1169 Type 2 diabetes mellitus with other specified complication: Secondary | ICD-10-CM | POA: Diagnosis not present

## 2022-06-10 DIAGNOSIS — Z1152 Encounter for screening for COVID-19: Secondary | ICD-10-CM

## 2022-06-10 DIAGNOSIS — N3281 Overactive bladder: Secondary | ICD-10-CM | POA: Diagnosis present

## 2022-06-10 DIAGNOSIS — K760 Fatty (change of) liver, not elsewhere classified: Secondary | ICD-10-CM | POA: Diagnosis not present

## 2022-06-10 DIAGNOSIS — S00532A Contusion of oral cavity, initial encounter: Secondary | ICD-10-CM | POA: Diagnosis not present

## 2022-06-10 DIAGNOSIS — Z7984 Long term (current) use of oral hypoglycemic drugs: Secondary | ICD-10-CM

## 2022-06-10 DIAGNOSIS — X58XXXA Exposure to other specified factors, initial encounter: Secondary | ICD-10-CM | POA: Diagnosis present

## 2022-06-10 DIAGNOSIS — R509 Fever, unspecified: Principal | ICD-10-CM | POA: Insufficient documentation

## 2022-06-10 DIAGNOSIS — G4730 Sleep apnea, unspecified: Secondary | ICD-10-CM | POA: Diagnosis present

## 2022-06-10 DIAGNOSIS — R531 Weakness: Secondary | ICD-10-CM | POA: Diagnosis present

## 2022-06-10 DIAGNOSIS — M5134 Other intervertebral disc degeneration, thoracic region: Secondary | ICD-10-CM | POA: Diagnosis present

## 2022-06-10 DIAGNOSIS — I129 Hypertensive chronic kidney disease with stage 1 through stage 4 chronic kidney disease, or unspecified chronic kidney disease: Secondary | ICD-10-CM | POA: Diagnosis present

## 2022-06-10 DIAGNOSIS — R262 Difficulty in walking, not elsewhere classified: Secondary | ICD-10-CM | POA: Diagnosis not present

## 2022-06-10 DIAGNOSIS — L80 Vitiligo: Secondary | ICD-10-CM | POA: Diagnosis present

## 2022-06-10 DIAGNOSIS — M4620 Osteomyelitis of vertebra, site unspecified: Secondary | ICD-10-CM | POA: Diagnosis not present

## 2022-06-10 DIAGNOSIS — E119 Type 2 diabetes mellitus without complications: Secondary | ICD-10-CM

## 2022-06-10 DIAGNOSIS — N182 Chronic kidney disease, stage 2 (mild): Secondary | ICD-10-CM | POA: Diagnosis not present

## 2022-06-10 DIAGNOSIS — Z79899 Other long term (current) drug therapy: Secondary | ICD-10-CM

## 2022-06-10 DIAGNOSIS — E1122 Type 2 diabetes mellitus with diabetic chronic kidney disease: Secondary | ICD-10-CM | POA: Diagnosis not present

## 2022-06-10 DIAGNOSIS — I10 Essential (primary) hypertension: Secondary | ICD-10-CM | POA: Diagnosis present

## 2022-06-10 DIAGNOSIS — F411 Generalized anxiety disorder: Secondary | ICD-10-CM | POA: Diagnosis present

## 2022-06-10 DIAGNOSIS — Z86010 Personal history of colonic polyps: Secondary | ICD-10-CM

## 2022-06-10 DIAGNOSIS — Z833 Family history of diabetes mellitus: Secondary | ICD-10-CM

## 2022-06-10 DIAGNOSIS — M545 Low back pain, unspecified: Secondary | ICD-10-CM | POA: Diagnosis not present

## 2022-06-10 DIAGNOSIS — K219 Gastro-esophageal reflux disease without esophagitis: Secondary | ICD-10-CM | POA: Diagnosis present

## 2022-06-10 DIAGNOSIS — Z83719 Family history of colon polyps, unspecified: Secondary | ICD-10-CM

## 2022-06-10 DIAGNOSIS — E0590 Thyrotoxicosis, unspecified without thyrotoxic crisis or storm: Secondary | ICD-10-CM | POA: Diagnosis present

## 2022-06-10 DIAGNOSIS — E871 Hypo-osmolality and hyponatremia: Secondary | ICD-10-CM | POA: Diagnosis not present

## 2022-06-10 DIAGNOSIS — E785 Hyperlipidemia, unspecified: Secondary | ICD-10-CM | POA: Diagnosis not present

## 2022-06-10 DIAGNOSIS — M6281 Muscle weakness (generalized): Secondary | ICD-10-CM | POA: Diagnosis not present

## 2022-06-10 DIAGNOSIS — R5383 Other fatigue: Secondary | ICD-10-CM

## 2022-06-10 DIAGNOSIS — Z8249 Family history of ischemic heart disease and other diseases of the circulatory system: Secondary | ICD-10-CM

## 2022-06-10 DIAGNOSIS — F419 Anxiety disorder, unspecified: Secondary | ICD-10-CM | POA: Diagnosis present

## 2022-06-10 DIAGNOSIS — Z7982 Long term (current) use of aspirin: Secondary | ICD-10-CM

## 2022-06-10 DIAGNOSIS — I6381 Other cerebral infarction due to occlusion or stenosis of small artery: Secondary | ICD-10-CM | POA: Diagnosis not present

## 2022-06-10 LAB — COMPREHENSIVE METABOLIC PANEL
ALT: 28 U/L (ref 0–44)
AST: 42 U/L — ABNORMAL HIGH (ref 15–41)
Albumin: 4 g/dL (ref 3.5–5.0)
Alkaline Phosphatase: 51 U/L (ref 38–126)
Anion gap: 11 (ref 5–15)
BUN: 17 mg/dL (ref 8–23)
CO2: 20 mmol/L — ABNORMAL LOW (ref 22–32)
Calcium: 9.1 mg/dL (ref 8.9–10.3)
Chloride: 98 mmol/L (ref 98–111)
Creatinine, Ser: 1.06 mg/dL — ABNORMAL HIGH (ref 0.44–1.00)
GFR, Estimated: 54 mL/min — ABNORMAL LOW (ref >60)
Glucose, Bld: 150 mg/dL — ABNORMAL HIGH (ref 70–99)
Potassium: 3.6 mmol/L (ref 3.5–5.1)
Sodium: 129 mmol/L — ABNORMAL LOW (ref 135–145)
Total Bilirubin: 1.2 mg/dL (ref 0.3–1.2)
Total Protein: 6.9 g/dL (ref 6.5–8.1)

## 2022-06-10 LAB — PHOSPHORUS: Phosphorus: 2.5 mg/dL (ref 2.5–4.6)

## 2022-06-10 LAB — RESP PANEL BY RT-PCR (RSV, FLU A&B, COVID)  RVPGX2
Influenza A by PCR: NEGATIVE
Influenza B by PCR: NEGATIVE
Resp Syncytial Virus by PCR: NEGATIVE
SARS Coronavirus 2 by RT PCR: NEGATIVE

## 2022-06-10 LAB — CBC WITH DIFFERENTIAL/PLATELET
Abs Immature Granulocytes: 0.01 10*3/uL (ref 0.00–0.07)
Basophils Absolute: 0 10*3/uL (ref 0.0–0.1)
Basophils Relative: 0 %
Eosinophils Absolute: 0 10*3/uL (ref 0.0–0.5)
Eosinophils Relative: 1 %
HCT: 35.7 % — ABNORMAL LOW (ref 36.0–46.0)
Hemoglobin: 11.4 g/dL — ABNORMAL LOW (ref 12.0–15.0)
Immature Granulocytes: 0 %
Lymphocytes Relative: 26 %
Lymphs Abs: 1.6 10*3/uL (ref 0.7–4.0)
MCH: 24.6 pg — ABNORMAL LOW (ref 26.0–34.0)
MCHC: 31.9 g/dL (ref 30.0–36.0)
MCV: 76.9 fL — ABNORMAL LOW (ref 80.0–100.0)
Monocytes Absolute: 0.8 10*3/uL (ref 0.1–1.0)
Monocytes Relative: 14 %
Neutro Abs: 3.5 10*3/uL (ref 1.7–7.7)
Neutrophils Relative %: 59 %
Platelets: 137 10*3/uL — ABNORMAL LOW (ref 150–400)
RBC: 4.64 MIL/uL (ref 3.87–5.11)
RDW: 15.2 % (ref 11.5–15.5)
WBC: 6 10*3/uL (ref 4.0–10.5)
nRBC: 0 % (ref 0.0–0.2)

## 2022-06-10 LAB — URINALYSIS, ROUTINE W REFLEX MICROSCOPIC
Bilirubin Urine: NEGATIVE
Glucose, UA: NEGATIVE mg/dL
Hgb urine dipstick: NEGATIVE
Ketones, ur: NEGATIVE mg/dL
Leukocytes,Ua: NEGATIVE
Nitrite: NEGATIVE
Protein, ur: NEGATIVE mg/dL
Specific Gravity, Urine: 1.017 (ref 1.005–1.030)
pH: 5 (ref 5.0–8.0)

## 2022-06-10 LAB — LACTIC ACID, PLASMA
Lactic Acid, Venous: 1.5 mmol/L (ref 0.5–1.9)
Lactic Acid, Venous: 1 mmol/L (ref 0.5–1.9)

## 2022-06-10 LAB — PROCALCITONIN: Procalcitonin: <0.1 ng/mL

## 2022-06-10 LAB — MAGNESIUM: Magnesium: 1.4 mg/dL — ABNORMAL LOW (ref 1.7–2.4)

## 2022-06-10 LAB — GROUP A STREP BY PCR: Group A Strep by PCR: NOT DETECTED

## 2022-06-10 MED ORDER — METRONIDAZOLE 500 MG/100ML IV SOLN
500.0000 mg | Freq: Two times a day (BID) | INTRAVENOUS | Status: DC
  Administered 2022-06-11 (×3): 500 mg via INTRAVENOUS
  Filled 2022-06-10 (×3): qty 100

## 2022-06-10 MED ORDER — IBUPROFEN 400 MG PO TABS
400.0000 mg | ORAL_TABLET | Freq: Once | ORAL | Status: AC
  Administered 2022-06-10: 400 mg via ORAL
  Filled 2022-06-10: qty 1

## 2022-06-10 MED ORDER — ATENOLOL 50 MG PO TABS
50.0000 mg | ORAL_TABLET | Freq: Every morning | ORAL | Status: DC
  Administered 2022-06-11: 50 mg via ORAL
  Filled 2022-06-10: qty 2

## 2022-06-10 MED ORDER — ACETAMINOPHEN 650 MG RE SUPP: 650.0000 mg | Freq: Four times a day (QID) | RECTAL | Status: DC | PRN

## 2022-06-10 MED ORDER — SODIUM CHLORIDE 0.9 % IV BOLUS
500.0000 mL | Freq: Once | INTRAVENOUS | Status: AC
  Administered 2022-06-10: 500 mL via INTRAVENOUS

## 2022-06-10 MED ORDER — ACETAMINOPHEN 325 MG PO TABS: 650.0000 mg | ORAL_TABLET | Freq: Four times a day (QID) | ORAL | Status: DC | PRN

## 2022-06-10 MED ORDER — SODIUM CHLORIDE 0.9 % IV BOLUS (SEPSIS)
1000.0000 mL | Freq: Once | INTRAVENOUS | Status: AC
  Administered 2022-06-10: 1000 mL via INTRAVENOUS

## 2022-06-10 MED ORDER — VANCOMYCIN HCL 1500 MG/300ML IV SOLN
1500.0000 mg | INTRAVENOUS | Status: DC
  Filled 2022-06-10: qty 300

## 2022-06-10 MED ORDER — VANCOMYCIN HCL 1500 MG/300ML IV SOLN
1500.0000 mg | Freq: Once | INTRAVENOUS | Status: AC
  Administered 2022-06-10: 1500 mg via INTRAVENOUS
  Filled 2022-06-10: qty 300

## 2022-06-10 MED ORDER — SODIUM CHLORIDE 0.9 % IV SOLN
2.0000 g | Freq: Two times a day (BID) | INTRAVENOUS | Status: DC
  Administered 2022-06-10 – 2022-06-11 (×2): 2 g via INTRAVENOUS
  Filled 2022-06-10 (×2): qty 12.5

## 2022-06-10 MED ORDER — ONDANSETRON HCL 4 MG PO TABS: 4.0000 mg | ORAL_TABLET | Freq: Four times a day (QID) | ORAL | Status: DC | PRN

## 2022-06-10 MED ORDER — IRBESARTAN 150 MG PO TABS
150.0000 mg | ORAL_TABLET | Freq: Every evening | ORAL | Status: DC
  Administered 2022-06-10 – 2022-06-13 (×4): 150 mg via ORAL
  Filled 2022-06-10 (×5): qty 1

## 2022-06-10 MED ORDER — ENOXAPARIN SODIUM 40 MG/0.4ML IJ SOSY
40.0000 mg | PREFILLED_SYRINGE | INTRAMUSCULAR | Status: DC
  Administered 2022-06-10 – 2022-06-13 (×4): 40 mg via SUBCUTANEOUS
  Filled 2022-06-10 (×4): qty 0.4

## 2022-06-10 MED ORDER — SENNOSIDES-DOCUSATE SODIUM 8.6-50 MG PO TABS: 1.0000 | ORAL_TABLET | Freq: Every evening | ORAL | Status: DC | PRN

## 2022-06-10 MED ORDER — MAGNESIUM SULFATE 2 GM/50ML IV SOLN
2.0000 g | Freq: Once | INTRAVENOUS | Status: AC
  Administered 2022-06-11: 2 g via INTRAVENOUS
  Filled 2022-06-10 (×2): qty 50

## 2022-06-10 MED ORDER — ACETAMINOPHEN 500 MG PO TABS
ORAL_TABLET | ORAL | Status: AC
  Filled 2022-06-10: qty 2

## 2022-06-10 MED ORDER — SERTRALINE HCL 50 MG PO TABS: 150.0000 mg | ORAL_TABLET | Freq: Every day | ORAL | Status: DC

## 2022-06-10 MED ORDER — IBUPROFEN 400 MG PO TABS
600.0000 mg | ORAL_TABLET | Freq: Four times a day (QID) | ORAL | Status: DC | PRN
  Administered 2022-06-11: 600 mg via ORAL
  Filled 2022-06-10: qty 2
  Filled 2022-06-10: qty 1

## 2022-06-10 MED ORDER — QUETIAPINE FUMARATE 25 MG PO TABS: 25.0000 mg | ORAL_TABLET | Freq: Every evening | ORAL | Status: DC | PRN

## 2022-06-10 MED ORDER — ROSUVASTATIN CALCIUM 20 MG PO TABS
20.0000 mg | ORAL_TABLET | Freq: Every day | ORAL | Status: DC
  Administered 2022-06-11 – 2022-06-13 (×3): 20 mg via ORAL
  Filled 2022-06-10 (×3): qty 1

## 2022-06-10 MED ORDER — ONDANSETRON HCL 4 MG/2ML IJ SOLN: 4.0000 mg | Freq: Four times a day (QID) | INTRAMUSCULAR | Status: DC | PRN

## 2022-06-10 MED ORDER — ACETAMINOPHEN 500 MG PO TABS
1000.0000 mg | ORAL_TABLET | Freq: Four times a day (QID) | ORAL | Status: DC | PRN
  Administered 2022-06-11 – 2022-06-13 (×7): 1000 mg via ORAL
  Filled 2022-06-10 (×7): qty 2

## 2022-06-10 MED ORDER — ACETAMINOPHEN 500 MG PO TABS
1000.0000 mg | ORAL_TABLET | Freq: Once | ORAL | Status: AC
  Administered 2022-06-10: 1000 mg via ORAL

## 2022-06-10 NOTE — Hospital Course (Addendum)
[name] is a 77 year old female with history of non-insulin-dependent diabetes mellitus, hyperlipidemia, hypertension, who presents emergency department for generalized weakness.  Initial vitals in the ED showed Tmax of 101.5, respiration rate of 16, heart rate of 80, blood pressure 135/63, SpO2 of 95% on room air.  Serum sodium is 129, potassium 3.6, chloride 98, bicarb 20, BUN of 17, serum creatinine 1.06, EGFR 54, nonfasting blood glucose 150, WBC 6.0, hemoglobin 11.4, platelets of 137.  Lactic acid is 1.5, procalcitonin < 0.10, UA was negative for leukocytes nitrates.  COVID/influenza A/influenza B/RSV PCR were negative.  ED treatment: Acetaminophen 1 g p.o., ibuprofen 400 mg p.o., sodium chloride 500 mL bolus.

## 2022-06-10 NOTE — Assessment & Plan Note (Signed)
-   With fever - Check Legionella urine antigen, if positive will initiate appropriate antibiotics - BMP in a.m.

## 2022-06-10 NOTE — Assessment & Plan Note (Signed)
-   Resumed home sertraline 100 mg daily

## 2022-06-10 NOTE — ED Triage Notes (Signed)
C/O weakness since last night.  Symptoms worsening today.  Decreased PO intake today.  Also c/o chills.

## 2022-06-10 NOTE — H&P (Signed)
History and Physical   Michele [name] IRC:[id number] DOB: 1946-04-15 DOA: 06/10/2022  PCP: [name], MD  Patient coming from: home  I have personally briefly reviewed patient's old medical records in [place].  Chief Concern: weakness  HPI: [name] is a 77 year old female with history of non-insulin-dependent diabetes mellitus, hyperlipidemia, hypertension, who presents emergency department for generalized weakness.  Initial vitals in the ED showed Tmax of 101.5, respiration rate of 16, heart rate of 80, blood pressure 135/63, SpO2 of 95% on room air.  Serum sodium is 129, potassium 3.6, chloride 98, bicarb 20, BUN of 17, serum creatinine 1.06, EGFR 54, nonfasting blood glucose 150, WBC 6.0, hemoglobin 11.4, platelets of 137.  Lactic acid is 1.5, procalcitonin < 0.10, UA was negative for leukocytes nitrates.  COVID/influenza A/influenza B/RSV PCR were negative.  ED treatment: Acetaminophen 1 g p.o., ibuprofen 400 mg p.o., sodium chloride 500 mL bolus. ------------------------- At bedside, she is able to tell me her name, age, current location, current calendar year.   She reprots that for the last two weeks she was out walking and difficulty walking.   Son was visited yesterday, she was able to walk and was her baseline self.   The changes happened today. She denies skin rashes, blister, cough, nausea, vomiting, chest pain, shortness of breath, dysuria, diarrhea.   She reports her last bowel movement was on 06/09/22 and it was brown and well formed. She denies known sick contacts, trauma to her person.  Family denies new pets or pet illness.  [place] does not have livestock. She denies rashes, tick bites.  Patient was not aware that she had a fever at home.  Social history: She lives at home with her husband.  She denies recreational drug use and EtOH use.  ROS: Constitutional: no weight change, no fever ENT/Mouth: no sore throat, no  rhinorrhea Eyes: no eye pain, no vision changes Cardiovascular: no chest pain, no dyspnea,  no edema, no palpitations Respiratory: no cough, no sputum, no wheezing Gastrointestinal: no nausea, no vomiting, no diarrhea, no constipation Genitourinary: no urinary incontinence, no dysuria, no hematuria Musculoskeletal: no arthralgias, no myalgias Skin: no skin lesions, no pruritus, Neuro: + weakness, no loss of consciousness, no syncope Psych: no anxiety, no depression, + decrease appetite Heme/Lymph: no bruising, no bleeding  ED Course: Discussed with emergency medicine provider, patient requiring hospitalization for chief concerns of fever.  Assessment/Plan  Principal Problem:   Fever Active Problems:   Hyperlipidemia associated with type 2 diabetes mellitus (HCC)   Essential hypertension   GERD   Anxiety   Depression, major, recurrent, in partial remission (HCC)   Weakness   Hyponatremia   HLD (hyperlipidemia)   Assessment and Plan:  * Fever - Etiology workup in progress - Patient does not meet criteria for SIRS/sepsis at this time - Kernig and Brudzinski were negative - Blood cultures x 2 are in process - Strep a by PCR was negative, procalcitonin was negative on admission - Check Legionella, 20 pathogen respiratory panel - Lactic acid is reassuring and that they were both not elevated - Broad-spectrum antibiotics ordered: Cefepime and vancomycin per pharmacy, metronidazole 500 mg IV twice daily - Admit to telemetry medical, inpatient  HLD (hyperlipidemia) - Resumed home rosuvastatin 20 mg nightly  Hyponatremia - With fever - Check Legionella urine antigen, if positive will initiate appropriate antibiotics - BMP in a.m.  Depression, major, recurrent, in partial remission ([place]) - Resumed home sertraline 100  mg daily  Essential hypertension - Resumed home atenolol 50 mg every morning, irbesartan 150 mg every evening  Chart reviewed.   DVT prophylaxis:  enoxaparin Code Status: DNR Diet: heart healthy Family Communication: updated spouse and son at bedside Disposition Plan: pending clinical course Consults called: none at this time Admission status: telemetry medical  Past Medical History:  Diagnosis Date   Anemia    Anxiety    Arthritis    Carpal tunnel syndrome    Cerebrovascular disease 01/02/2017   Duodenal diverticulum    Elevated uric acid in blood    Esophageal motility disorder    Fatty liver    GERD (gastroesophageal reflux disease)    Hyperlipidemia    Hypertension    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   Sleep apnea    Tubulovillous adenoma of colon    Type II or unspecified type diabetes mellitus without mention of complication, not stated as uncontrolled    Past Surgical History:  Procedure Laterality Date   APPENDECTOMY     BREAST BIOPSY Right    CARPAL TUNNEL RELEASE Right 1992   cataract extration     COLONOSCOPY     CYST REMOVAL HAND     TONSILLECTOMY  1952   Social History:  reports that she has never smoked. She has never used smokeless tobacco. She reports that she does not drink alcohol and does not use drugs.  Allergies  Allergen Reactions   Lipitor [Atorvastatin]     Elevates LFT's   Family History  Problem Relation Age of Onset   Heart disease Mother    Diabetes Mother    Colon polyps Mother    Heart disease Father    Diabetes Father    Heart attack Father    Cerebral aneurysm Sister    Heart disease Brother    Heart attack Brother    Heart disease Brother        Stent   Colon cancer Neg Hx    Esophageal cancer Neg Hx    Stomach cancer Neg Hx    Rectal cancer Neg Hx    Breast cancer Neg Hx    Sleep apnea Neg Hx    Family history: Family history reviewed and not pertinent.  Prior to Admission medications   Medication Sig Start Date End Date Taking? Authorizing Provider  acetaminophen (TYLENOL) 500 MG tablet Take 500 mg by mouth 3 (three) times daily as needed for moderate pain.     [provider]  aspirin 325 MG tablet Take 325 mg by mouth daily.    [provider]  atenolol (TENORMIN) 50 MG tablet Take 1 to 2 tablets every Morning for BP                                               /                            TAKE                                                   BY  MOUTH 05/13/22   [name], MD  blood glucose meter kit and supplies KIT Check blood sugar 1 time daily-DX-E11.22 10/03/20   [name], MD  blood glucose meter kit and supplies Test sugar once a day. 10/14/18   Vladimir Crofts, PA-C  Blood Glucose Monitoring Suppl DEVI Test sugars once daily.DX diabetes 11/26/18   Liane Comber, NP  Cholecalciferol (VITAMIN D PO) Take 10,000 Units by mouth. 2 times a week no W and F    [provider]  cloNIDine (CATAPRES) 0.2 MG tablet Take      1 tablet       3 x /day        for BP. Take if BP > 160 05/03/21   Alycia Rossetti, NP  Cyanocobalamin (VITAMIN B-12 PO) Take 1 tablet by mouth daily. 5000 mc one time in the AM PRN    [provider]  glucose blood test strip Test  Blood Sugar  Daily  (Dx:  e11.29  )) 06/28/21   [name], MD  Lancets MISC Test blood sugar once daily 11/26/18   Liane Comber, NP  meloxicam (MOBIC) 15 MG tablet Take 1 tablet (15 mg total) by mouth daily as needed for pain. 01/30/22   Mcarthur Rossetti, MD  metFORMIN (GLUCOPHAGE-XR) 500 MG 24 hr tablet Take  2 tablets   2 x /day   with Meals  for Diabetes 05/16/22   [name], MD  minoxidil (LONITEN) 10 MG tablet Take  1 tablet  Daily for BP                                       /                                                TAKE                       BY                          MOUTH 11/29/21   [name], MD  olmesartan (BENICAR) 40 MG tablet Take 1 tablet every Evening for BP 11/28/21   [name], MD  oxybutynin (DITROPAN-XL) 10 MG 24 hr tablet Take  1 tablet  Daily for Bladder Control                                               /                          TAKE                       BY                       MOUTH 11/29/21   [name], MD  rosuvastatin (CRESTOR) 20 MG tablet Take  1 tablet  Daily for Cholesterol                                                 /  TAKE                   BY              MOUTH     (?) ONCE DAILY 08/13/21   [name], MD  sertraline (ZOLOFT) 100 MG tablet 1 and 1/2 tablet daily 06/07/22   Alycia Rossetti, NP  terbinafine (LAMISIL) 250 MG tablet Take   1 tablet  Daily  for Toenail Fungus 04/18/22   [name], MD  verapamil (CALAN-SR) 240 MG CR tablet Take  1 tablet  Daily  with Food for BP & Heart Rhythm 05/03/21   Alycia Rossetti, NP   Physical Exam: Vitals:   06/10/22 1406 06/10/22 1527 06/10/22 1907 06/10/22 1940  BP: 135/65  (!) 137/55   Pulse: 80 75 71   Resp: 16  17   Temp: (!) 101.5 F (38.6 C) 97.8 F (36.6 C) (!) 102.7 F (39.3 C) (!) 101.8 F (38.8 C)  TempSrc: Oral Oral Oral   SpO2: 95% 94% 96%   Weight:      Height:       Constitutional: appears age appropriate, NAD, calm, comfortable Eyes: PERRL, lids and conjunctivae normal ENMT: Mucous membranes are moist. Posterior pharynx clear of any exudate or lesions. Age-appropriate dentition. Hearing appropriate Neck: normal, supple, no masses, no thyromegaly Respiratory: clear to auscultation bilaterally, no wheezing, no crackles. Normal respiratory effort. No accessory muscle use.  Cardiovascular: Regular rate and rhythm, no murmurs / rubs / gallops. No extremity edema. 2+ pedal pulses. No carotid bruits.  Abdomen: no tenderness, no masses palpated, no hepatosplenomegaly. Bowel sounds positive.  Musculoskeletal: no clubbing / cyanosis. No joint deformity upper and lower extremities. Good ROM, no contractures, no atrophy. Normal muscle tone.  Skin: increased warm skin, no rashes, lesions, ulcers. No induration Neurologic: Sensation intact.  Strength 5/5 in all 4.  Psychiatric: Normal judgment and insight. Alert and oriented x 3. Normal mood.   EKG: ordered  Chest x-ray on Admission: I personally reviewed and I agree with radiologist reading as below.  DG Chest 2 View  Result Date: 06/10/2022 CLINICAL DATA:  Weakness. EXAM: CHEST - 2 VIEW COMPARISON:  Chest x-ray dated April 01, 2016. FINDINGS: The heart size and mediastinal contours are within normal limits. Both lungs are clear. The visualized skeletal structures are unremarkable. IMPRESSION: No active cardiopulmonary disease. Electronically Signed   By: Titus Dubin M.D.   On: 06/10/2022 16:39    Labs on Admission: I have personally reviewed following labs  CBC: Recent Labs  Lab 06/10/22 1531  WBC 6.0  NEUTROABS 3.5  HGB 11.4*  HCT 35.7*  MCV 76.9*  PLT 474*   Basic Metabolic Panel: Recent Labs  Lab 06/10/22 1531 06/10/22 2016  NA 129*  --   K 3.6  --   CL 98  --   CO2 20*  --   GLUCOSE 150*  --   BUN 17  --   CREATININE 1.06*  --   CALCIUM 9.1  --   MG  --  1.4*  PHOS  --  2.5   GFR: Estimated Creatinine Clearance: 43.1 mL/min (A) (by C-G formula based on SCr of 1.06 mg/dL (H)).  Liver Function Tests: Recent Labs  Lab 06/10/22 1531  AST 42*  ALT 28  ALKPHOS 51  BILITOT 1.2  PROT 6.9  ALBUMIN 4.0   Urine analysis:    Component Value Date/Time   COLORURINE YELLOW (A) 06/10/2022 1532  APPEARANCEUR CLEAR (A) 06/10/2022 1532   LABSPEC 1.017 06/10/2022 1532   PHURINE 5.0 06/10/2022 1532   GLUCOSEU NEGATIVE 06/10/2022 1532   HGBUR NEGATIVE 06/10/2022 [place] 06/10/2022 [place] 06/10/2022 1532   PROTEINUR NEGATIVE 06/10/2022 1532   NITRITE NEGATIVE 06/10/2022 [place] 06/10/2022 1532   This document was prepared using Dragon Voice Recognition software and may include unintentional dictation errors.  Dr. Tobie Poet [facility]  If 7PM-7AM, please contact overnight-coverage  provider If 7AM-7PM, please contact day coverage provider [email or web address]  06/10/2022, 10:13 PM

## 2022-06-10 NOTE — Progress Notes (Signed)
Pharmacy Antibiotic Note  Michele [name] is a 77 y.o. female admitted on 06/10/2022 with sepsis.  Pharmacy has been consulted for Cefepime and Vancomycin dosing.  Plan: Vancomycin 1500 mg IV Q 36 hrs. Goal AUC 400-550. Expected AUC: 542.6 SCr used: 1.06 Expected Cmin: 11.8  Cefepime 2g IV q12h   Height: '5\' 3"'$  (160 cm) Weight: 72.3 kg (159 lb 6.3 oz) IBW/kg (Calculated) : 52.4  Temp (24hrs), Avg:101 F (38.3 C), Min:97.8 F (36.6 C), Max:102.7 F (39.3 C)  Recent Labs  Lab 06/10/22 1531 06/10/22 1855  WBC 6.0  --   CREATININE 1.06*  --   LATICACIDVEN  --  1.5    Estimated Creatinine Clearance: 43.1 mL/min (A) (by C-G formula based on SCr of 1.06 mg/dL (H)).    Allergies  Allergen Reactions   Lipitor [Atorvastatin]     Elevates LFT's    Antimicrobials this admission: Cefepime 1/8 >>  Vancomycin 1/8 >>  Metronidazole 1/8 >>  Dose adjustments this admission:  Microbiology results:   Thank you for allowing pharmacy to be a part of this patient's care.  Paulina Fusi, PharmD, BCPS 06/10/2022 8:17 PM

## 2022-06-10 NOTE — Assessment & Plan Note (Signed)
-   Resumed home atenolol 50 mg every morning, irbesartan 150 mg every evening

## 2022-06-10 NOTE — Assessment & Plan Note (Signed)
-   Resumed home rosuvastatin 20 mg nightly

## 2022-06-10 NOTE — ED Provider Notes (Signed)
[facility] Provider Note    Event Date/Time   First MD Initiated Contact with Patient 06/10/22 1749     (approximate)   History   No chief complaint on file.   HPI  Michele [name] is a 77 y.o. female with history of OSA, pretension, depression who presents with generalized weakness since yesterday, acute onset, and characterized by feeling shaky and unsteady.  The patient states that when she walks she feels like she is going to run into things.  However, she does not feel like she is going to pass out.  The patient did not realize she had a fever until she came to the ED.  She denies any vomiting or diarrhea, abdominal or chest pain, cough or shortness of breath, sore throat, nasal congestion, or any urinary symptoms.  She does have a mild bilateral frontal headache over the last 1 to 2 days.  Denies any neck stiffness.  She does not feel confused and family members state that there has been no change in her mental status.  I reviewed the past medical records.  The patient's most recent outpatient encounter was with primary care on 1/5. She was followed up for hypertension and depression and was continued on her medications.  She was also scheduled for an outpatient RLE ultrasound to rule out DVT.   Physical Exam   Triage Vital Signs: ED Triage Vitals  Enc Vitals Group     BP 06/10/22 1406 135/65     Pulse Rate 06/10/22 1406 80     Resp 06/10/22 1406 16     Temp 06/10/22 1406 (!) 101.5 F (38.6 C)     Temp Source 06/10/22 1406 Oral     SpO2 06/10/22 1406 95 %     Weight 06/10/22 1405 159 lb 6.3 oz (72.3 kg)     Height 06/10/22 1405 '5\' 3"'$  (1.6 m)     Head Circumference --      Peak Flow --      Pain Score 06/10/22 1405 0     Pain Loc --      Pain Edu? --      Excl. in [place]? --     Most recent vital signs: Vitals:   06/10/22 1907 06/10/22 1940  BP: (!) 137/55   Pulse: 71   Resp: 17   Temp: (!) 102.7 F (39.3 C) (!) 101.8 F (38.8 C)  SpO2:  96%      General: Alert and oriented, relatively well-appearing. CV:  Good peripheral perfusion.  Normal heart sounds. Resp:  Normal effort.  Lungs CTAB. Abd:  Often nontender.  No distention.  Other:  No peripheral edema.  EOMI.  PERRLA.  No photophobia.  Neck supple, full range of motion.  No meningeal signs.  Oropharynx clear with no erythema or exudate.   ED Results / Procedures / Treatments   Labs (all labs ordered are listed, but only abnormal results are displayed) Labs Reviewed  COMPREHENSIVE METABOLIC PANEL - Abnormal; Notable for the following components:      Result Value   Sodium 129 (*)    CO2 20 (*)    Glucose, Bld 150 (*)    Creatinine, Ser 1.06 (*)    AST 42 (*)    GFR, Estimated 54 (*)    All other components within normal limits  CBC WITH DIFFERENTIAL/PLATELET - Abnormal; Notable for the following components:   Hemoglobin 11.4 (*)    HCT 35.7 (*)    MCV  76.9 (*)    MCH 24.6 (*)    Platelets 137 (*)    All other components within normal limits  URINALYSIS, ROUTINE W REFLEX MICROSCOPIC - Abnormal; Notable for the following components:   Color, Urine YELLOW (*)    APPearance CLEAR (*)    All other components within normal limits  RESP PANEL BY RT-PCR (RSV, FLU A&B, COVID)  [id number]  GROUP A STREP BY PCR  CULTURE, BLOOD (ROUTINE X 2)  CULTURE, BLOOD (ROUTINE X 2)  LACTIC ACID, PLASMA  PROCALCITONIN  LACTIC ACID, PLASMA  PROCALCITONIN  PHOSPHORUS  MAGNESIUM  BASIC METABOLIC PANEL  CBC     EKG     RADIOLOGY  Chest x-ray: I independently viewed and interpreted the images; there is no focal consolidation or edema  PROCEDURES:  Critical Care performed: No  Procedures   MEDICATIONS ORDERED IN ED: Medications  ondansetron (ZOFRAN) tablet 4 mg (has no administration in time range)    Or  ondansetron (ZOFRAN) injection 4 mg (has no administration in time range)  enoxaparin (LOVENOX) injection 40 mg (has no administration in time range)   sodium chloride 0.9 % bolus 1,000 mL (has no administration in time range)  metroNIDAZOLE (FLAGYL) IVPB 500 mg (has no administration in time range)  senna-docusate (Senokot-S) tablet 1 tablet (has no administration in time range)  ceFEPIme (MAXIPIME) 2 g in sodium chloride 0.9 % 100 mL IVPB (has no administration in time range)  vancomycin (VANCOREADY) IVPB 1500 mg/300 mL (has no administration in time range)  ibuprofen (ADVIL) tablet 600 mg (has no administration in time range)  acetaminophen (TYLENOL) tablet 1,000 mg (has no administration in time range)    Or  acetaminophen (TYLENOL) suppository 650 mg (has no administration in time range)  acetaminophen (TYLENOL) tablet 1,000 mg (1,000 mg Oral Given 06/10/22 1411)  sodium chloride 0.9 % bolus 500 mL (500 mLs Intravenous New Bag/Given 06/10/22 1923)  ibuprofen (ADVIL) tablet 400 mg (400 mg Oral Given 06/10/22 1924)     IMPRESSION / MDM / ASSESSMENT AND PLAN / ED COURSE  I reviewed the triage vital signs and the nursing notes.  77 year old female with PMH as noted above presents with generalized weakness and feeling shaky since yesterday.  She was found to be febrile on arrival to the ED.  Review of systems is negative aside from mild headache.  Physical exam is unremarkable for acute findings except for fever.  Initial workup so far is unrevealing.  The patient has no leukocytosis.  Electrolytes are normal.  Respiratory panel is negative.  Urinalysis is negative.  Chest x-ray does not show any findings consistent with pneumonia.  Differential diagnosis includes, but is not limited to, other viral syndrome, group A strep, possible COVID or flu with a false negative respiratory panel.  There is no clinical evidence for meningitis.  Patient's presentation is most consistent with acute presentation with potential threat to life or bodily function.  ----------------------------------------- 8:13 PM on  06/10/2022 -----------------------------------------   Lactate is normal.  Procalcitonin and strep are pending.  On reassessment, the patient continues to appear relatively comfortable although she is still febrile.  She denies any headache at this time and has a negative Kernig and presents key sign.  Her only symptom at this time is mild back pain.  Given her weakness and her age along with the persistent fever I we will admit her for further workup and monitoring.  At this time she does not meet sepsis criteria so I have  not ordered empiric antibiotics.  I consulted Dr. Tobie Poet from the hospitalist service; based her discussion she agrees to admit the patient.  FINAL CLINICAL IMPRESSION(S) / ED DIAGNOSES   Final diagnoses:  Febrile illness     Rx / DC Orders   ED Discharge Orders     None        Note:  This document was prepared using Dragon voice recognition software and may include unintentional dictation errors.    Arta Silence, MD 06/10/22 2014

## 2022-06-10 NOTE — Assessment & Plan Note (Addendum)
-   Etiology workup in progress - Patient does not meet criteria for SIRS/sepsis at this time - Kernig and Brudzinski were negative - Blood cultures x 2 are in process - Strep a by PCR was negative, procalcitonin was negative on admission - Check Legionella, 20 pathogen respiratory panel - Lactic acid is reassuring and that they were both not elevated - Broad-spectrum antibiotics ordered: Cefepime and vancomycin per pharmacy, metronidazole 500 mg IV twice daily - Admit to telemetry medical, inpatient

## 2022-06-11 ENCOUNTER — Inpatient Hospital Stay: Payer: PPO

## 2022-06-11 ENCOUNTER — Encounter: Payer: Self-pay | Admitting: Internal Medicine

## 2022-06-11 ENCOUNTER — Ambulatory Visit: Payer: PPO | Admitting: Nurse Practitioner

## 2022-06-11 DIAGNOSIS — M4620 Osteomyelitis of vertebra, site unspecified: Secondary | ICD-10-CM | POA: Diagnosis not present

## 2022-06-11 DIAGNOSIS — R509 Fever, unspecified: Secondary | ICD-10-CM | POA: Diagnosis not present

## 2022-06-11 LAB — MRSA NEXT GEN BY PCR, NASAL: MRSA by PCR Next Gen: NOT DETECTED

## 2022-06-11 LAB — RESPIRATORY PANEL BY PCR

## 2022-06-11 LAB — CBC
HCT: 32.3 % — ABNORMAL LOW (ref 36.0–46.0)
Hemoglobin: 10.4 g/dL — ABNORMAL LOW (ref 12.0–15.0)
MCH: 25.2 pg — ABNORMAL LOW (ref 26.0–34.0)
MCHC: 32.2 g/dL (ref 30.0–36.0)
MCV: 78.4 fL — ABNORMAL LOW (ref 80.0–100.0)
Platelets: 111 10*3/uL — ABNORMAL LOW (ref 150–400)
RBC: 4.12 MIL/uL (ref 3.87–5.11)
RDW: 14.9 % (ref 11.5–15.5)
WBC: 4.9 10*3/uL (ref 4.0–10.5)
nRBC: 0 % (ref 0.0–0.2)

## 2022-06-11 LAB — CBC WITH DIFFERENTIAL/PLATELET
Abs Immature Granulocytes: 0.02 10*3/uL (ref 0.00–0.07)
Basophils Absolute: 0 10*3/uL (ref 0.0–0.1)
Basophils Relative: 1 %
Eosinophils Absolute: 0.1 10*3/uL (ref 0.0–0.5)
Eosinophils Relative: 1 %
HCT: 33 % — ABNORMAL LOW (ref 36.0–46.0)
Hemoglobin: 10.4 g/dL — ABNORMAL LOW (ref 12.0–15.0)
Immature Granulocytes: 0 %
Lymphocytes Relative: 26 %
Lymphs Abs: 1.3 10*3/uL (ref 0.7–4.0)
MCH: 24.7 pg — ABNORMAL LOW (ref 26.0–34.0)
MCHC: 31.5 g/dL (ref 30.0–36.0)
MCV: 78.4 fL — ABNORMAL LOW (ref 80.0–100.0)
Monocytes Absolute: 0.7 10*3/uL (ref 0.1–1.0)
Monocytes Relative: 14 %
Neutro Abs: 2.9 10*3/uL (ref 1.7–7.7)
Neutrophils Relative %: 58 %
Platelets: 121 10*3/uL — ABNORMAL LOW (ref 150–400)
RBC: 4.21 MIL/uL (ref 3.87–5.11)
RDW: 15.3 % (ref 11.5–15.5)
WBC: 5.1 10*3/uL (ref 4.0–10.5)
nRBC: 0 % (ref 0.0–0.2)

## 2022-06-11 LAB — BASIC METABOLIC PANEL
Anion gap: 8 (ref 5–15)
BUN: 14 mg/dL (ref 8–23)
CO2: 20 mmol/L — ABNORMAL LOW (ref 22–32)
Calcium: 8.1 mg/dL — ABNORMAL LOW (ref 8.9–10.3)
Chloride: 104 mmol/L (ref 98–111)
Creatinine, Ser: 0.98 mg/dL (ref 0.44–1.00)
GFR, Estimated: 60 mL/min — ABNORMAL LOW (ref >60)
Glucose, Bld: 137 mg/dL — ABNORMAL HIGH (ref 70–99)
Potassium: 3.5 mmol/L (ref 3.5–5.1)
Sodium: 132 mmol/L — ABNORMAL LOW (ref 135–145)

## 2022-06-11 LAB — TECHNOLOGIST SMEAR REVIEW: Plt Morphology: NORMAL

## 2022-06-11 LAB — TSH: TSH: 1.237 u[IU]/mL (ref 0.350–4.500)

## 2022-06-11 LAB — PROCALCITONIN: Procalcitonin: <0.1 ng/mL

## 2022-06-11 LAB — RESP PANEL BY RT-PCR (RSV, FLU A&B, COVID)  RVPGX2
Influenza A by PCR: NEGATIVE
Influenza B by PCR: NEGATIVE
Resp Syncytial Virus by PCR: NEGATIVE
SARS Coronavirus 2 by RT PCR: NEGATIVE

## 2022-06-11 LAB — CK: Total CK: 187 U/L (ref 38–234)

## 2022-06-11 LAB — SEDIMENTATION RATE: Sed Rate: 12 mm/hr (ref 0–22)

## 2022-06-11 LAB — MAGNESIUM: Magnesium: 2.1 mg/dL (ref 1.7–2.4)

## 2022-06-11 LAB — T4, FREE: Free T4: 0.89 ng/dL (ref 0.61–1.12)

## 2022-06-11 MED ORDER — VERAPAMIL HCL ER 240 MG PO TBCR
240.0000 mg | EXTENDED_RELEASE_TABLET | Freq: Every day | ORAL | Status: DC
  Administered 2022-06-11: 240 mg via ORAL
  Filled 2022-06-11 (×2): qty 1

## 2022-06-11 MED ORDER — GADOBUTROL 1 MMOL/ML IV SOLN: 9.0000 mL | Freq: Once | INTRAVENOUS | Status: DC | PRN

## 2022-06-11 MED ORDER — OXYBUTYNIN CHLORIDE ER 10 MG PO TB24
10.0000 mg | ORAL_TABLET | Freq: Every day | ORAL | Status: DC
  Administered 2022-06-11 – 2022-06-13 (×3): 10 mg via ORAL
  Filled 2022-06-11 (×3): qty 1

## 2022-06-11 MED ORDER — SODIUM CHLORIDE 0.9 % IV SOLN
2.0000 g | Freq: Two times a day (BID) | INTRAVENOUS | Status: DC
  Administered 2022-06-11 – 2022-06-12 (×3): 2 g via INTRAVENOUS
  Filled 2022-06-11: qty 2
  Filled 2022-06-11: qty 20
  Filled 2022-06-11: qty 2

## 2022-06-11 MED ORDER — SODIUM CHLORIDE 0.9 % IV SOLN: INTRAVENOUS | Status: DC

## 2022-06-11 MED ORDER — CLONIDINE HCL 0.1 MG PO TABS
0.2000 mg | ORAL_TABLET | Freq: Three times a day (TID) | ORAL | Status: DC
  Administered 2022-06-11: 0.2 mg via ORAL
  Filled 2022-06-11: qty 2

## 2022-06-11 NOTE — Progress Notes (Signed)
   06/11/22 2226  Assess: MEWS Score  Temp (!) 102.7 F (39.3 C)  BP (!) 132/53  MAP (mmHg) 76  Pulse Rate 66  Resp (!) 28  SpO2 90 %  O2 Device Room Air  Assess: MEWS Score  MEWS Temp 2  MEWS Systolic 0  MEWS Pulse 0  MEWS RR 2  MEWS LOC 0  MEWS Score 4  MEWS Score Color Red  Assess: if the MEWS score is Yellow or Red  Were vital signs taken at a resting state? Yes  Focused Assessment No change from prior assessment  Does the patient meet 2 or more of the SIRS criteria? Yes  Does the patient have a confirmed or suspected source of infection? Yes  Provider and Rapid Response Notified? Yes  MEWS guidelines implemented *See Row Information* Yes  Treat  MEWS Interventions Administered prn meds/treatments  Take Vital Signs  Increase Vital Sign Frequency  Red: Q 1hr X 4 then Q 4hr X 4, if remains red, continue Q 4hrs  Escalate  MEWS: Escalate Red: discuss with charge nurse/RN and provider, consider discussing with RRT  Notify: Charge Nurse/RN  Name of Charge Nurse/RN Notified Jerral Bonito  Date Charge Nurse/RN Notified 06/11/22  Time Charge Nurse/RN Notified 2230  Provider Notification  Provider Name/Title Chinita Pester  Date Provider Notified 06/11/22  Time Provider Notified 2235  Method of Notification Shakirra Buehler  Notification Reason Other (Comment) (Red MEWS)  Provider response No new orders  Date of Provider Response 06/11/22  Time of Provider Response 2240  Document  Patient Outcome Other (Comment) (PRN Ibuprofen given)  Progress note created (see row info) Yes  Assess: SIRS CRITERIA  SIRS Temperature  1  SIRS Pulse 0  SIRS Respirations  1  SIRS WBC 0  SIRS Score Sum  2

## 2022-06-11 NOTE — Progress Notes (Addendum)
PROGRESS NOTE    [name]  TDD:[id number] DOB: Jun 03, 1946 DOA: 06/10/2022 PCP: [name], MD     Brief Narrative:   From admission h and p  [name] is a 77 year old female with history of non-insulin-dependent diabetes mellitus, hyperlipidemia, hypertension, who presents emergency department for generalized weakness.   At bedside, she is able to tell me her name, age, current location, current calendar year.    She reprots that for the last two weeks she was out walking and difficulty walking.    Son was visited yesterday, she was able to walk and was her baseline self.    The changes happened today. She denies skin rashes, blister, cough, nausea, vomiting, chest pain, shortness of breath, dysuria, diarrhea.    She reports her last bowel movement was on 06/09/22 and it was brown and well formed. She denies known sick contacts, trauma to her person.   Family denies new pets or pet illness.  [place] does not have livestock. She denies rashes, tick bites.   Patient was not aware that she had a fever at home.  Assessment & Plan:   Principal Problem:   Fever Active Problems:   Hyperlipidemia associated with type 2 diabetes mellitus (HCC)   Essential hypertension   GERD   CKD stage 2 due to type 2 diabetes mellitus (HCC)   Type 2 diabetes mellitus (HCC)   Sleep apnea on CPAP   Anxiety   Depression, major, recurrent, in partial remission (HCC)   Weakness   Hyponatremia   HLD (hyperlipidemia)  # Fever Without a source. With encephalopathy. Though no neck pain/stiffness or headache. Rsv/covid/flu negative, cxr clear. Ua clear. Strep neg. No vision symptoms to suggest gca. No recent travel. No GI symptoms to suspect GI source. No skin lesions. Only symptoms is several weeks low back pain. Only on one serotonergic agent (sertraline). No tick or other bites reported, nor is it the season for such. - will check mri of low and mid back.  Hemodynamically stable, normal lactate - f/u tsh and t4 given hx of hyperthyroid - check ck, hiv - ct head - will cover for meningitis with ceftriaxone/vancomycin, may need LP if etiology not identified - consider ct c/a/p for further w/u - f/u cultures, smear - gentle fluids  # HTN Here bp elevated - cont home atenolol, irbesartan, verapamil. As for home clonidine, she says she only takes it prn for elevated pressures  # Overactive bladder - home oxybutynin  # T2DM Glucose appropraite - hold home metformin - SSI  # GAD - hold home sertraline for now  # Thrombocytopenia Mild, likely reactive - smear, trend  # OSA - cpap qhs   DVT prophylaxis: lovenox Code Status: dnr documented in h and p but husband indicates she would want to be resuscitated, will change to full code Family Communication: husband updated telephonically 1/9  Level of care: Telemetry Medical Status is: Inpatient Remains inpatient appropriate because: severity of illness    Consultants:  none  Procedures: none  Antimicrobials:  See atove    Subjective: Mild low back pain, confused,   Objective: Vitals:   06/11/22 0630 06/11/22 0700 06/11/22 0701 06/11/22 0835  BP: (!) 185/61  (!) 162/93   Pulse: 65 67    Resp: (!) 27 16    Temp:    (!) 100.5 F (38.1 C)  TempSrc:    Oral  SpO2: 93% 98%    Weight:  Height:       No intake or output data in the 24 hours ending 06/11/22 0850 Filed Weights   06/10/22 1405  Weight: 72.3 kg    Examination:  General exam: Appears calm and comfortable  Respiratory system: Clear to auscultation. Respiratory effort normal. Cardiovascular system: S1 & S2 heard, RRR. No JVD, murmurs, rubs, gallops or clicks. No pedal edema. Gastrointestinal system: Abdomen is nondistended, soft and nontender. No organomegaly or masses felt. Normal bowel sounds heard. Central nervous system: Alert and oriented x3 but slow to respond, moving all 4 Extremities:  Symmetric 5 x 5 power. Skin: No rashes, lesions or ulcers Psychiatry: confused, slow to respond     Data Reviewed: I have personally reviewed following labs and imaging studies  CBC: Recent Labs  Lab 06/10/22 1531 06/11/22 0425  WBC 6.0 4.9  NEUTROABS 3.5  --   HGB 11.4* 10.4*  HCT 35.7* 32.3*  MCV 76.9* 78.4*  PLT 137* 427*   Basic Metabolic Panel: Recent Labs  Lab 06/10/22 1531 06/10/22 2016 06/11/22 0425  NA 129*  --  132*  K 3.6  --  3.5  CL 98  --  104  CO2 20*  --  20*  GLUCOSE 150*  --  137*  BUN 17  --  14  CREATININE 1.06*  --  0.98  CALCIUM 9.1  --  8.1*  MG  --  1.4* 2.1  PHOS  --  2.5  --    GFR: Estimated Creatinine Clearance: 46.6 mL/min (by C-G formula based on SCr of 0.98 mg/dL). Liver Function Tests: Recent Labs  Lab 06/10/22 1531  AST 42*  ALT 28  ALKPHOS 51  BILITOT 1.2  PROT 6.9  ALBUMIN 4.0   No results for input(s): "LIPASE", "AMYLASE" in the last 168 hours. No results for input(s): "AMMONIA" in the last 168 hours. Coagulation Profile: No results for input(s): "INR", "PROTIME" in the last 168 hours. Cardiac Enzymes: No results for input(s): "CKTOTAL", "CKMB", "CKMBINDEX", "TROPONINI" in the last 168 hours. BNP (last 3 results) No results for input(s): "PROBNP" in the last 8760 hours. HbA1C: No results for input(s): "HGBA1C" in the last 72 hours. CBG: No results for input(s): "GLUCAP" in the last 168 hours. Lipid Profile: No results for input(s): "CHOL", "HDL", "LDLCALC", "TRIG", "CHOLHDL", "LDLDIRECT" in the last 72 hours. Thyroid Function Tests: No results for input(s): "TSH", "T4TOTAL", "FREET4", "T3FREE", "THYROIDAB" in the last 72 hours. Anemia Panel: No results for input(s): "VITAMINB12", "FOLATE", "FERRITIN", "TIBC", "IRON", "RETICCTPCT" in the last 72 hours. Urine analysis:    Component Value Date/Time   COLORURINE YELLOW (A) 06/10/2022 1532   APPEARANCEUR CLEAR (A) 06/10/2022 1532   LABSPEC 1.017 06/10/2022 1532    PHURINE 5.0 06/10/2022 1532   GLUCOSEU NEGATIVE 06/10/2022 1532   HGBUR NEGATIVE 06/10/2022 1532   BILIRUBINUR NEGATIVE 06/10/2022 1532   KETONESUR NEGATIVE 06/10/2022 1532   PROTEINUR NEGATIVE 06/10/2022 1532   NITRITE NEGATIVE 06/10/2022 1532   LEUKOCYTESUR NEGATIVE 06/10/2022 1532   Sepsis Labs: '@LABRCNTIP'$ (procalcitonin:4,lacticidven:4)  ) Recent Results (from the past 240 hour(s))  Resp panel by RT-PCR (RSV, Flu A&B, Covid) Anterior Nasal Swab     Status: None   Collection Time: 06/10/22  2:10 PM   Specimen: Anterior Nasal Swab  Result Value Ref Range Status   SARS Coronavirus 2 by RT PCR NEGATIVE NEGATIVE Final    Comment: (NOTE) SARS-CoV-2 target nucleic acids are NOT DETECTED.  The SARS-CoV-2 RNA is generally detectable in upper respiratory specimens during the  acute phase of infection. The lowest concentration of SARS-CoV-2 viral copies this assay can detect is 138 copies/mL. A negative result does not preclude SARS-Cov-2 infection and should not be used as the sole basis for treatment or other patient management decisions. A negative result may occur with  improper specimen collection/handling, submission of specimen other than nasopharyngeal swab, presence of viral mutation(s) within the areas targeted by this assay, and inadequate number of viral copies(<138 copies/mL). A negative result must be combined with clinical observations, patient history, and epidemiological information. The expected result is Negative.  Fact Sheet for Patients:  EntrepreneurPulse.com.au  Fact Sheet for Healthcare Providers:  IncredibleEmployment.be  This test is no t yet approved or cleared by the [place] FDA and  has been authorized for detection and/or diagnosis of SARS-CoV-2 by FDA under an Emergency Use Authorization (EUA). This EUA will remain  in effect (meaning this test can be used) for the duration of the COVID-19 declaration under  Section 564(b)(1) of the Act, 21 U.S.C.section 360bbb-3(b)(1), unless the authorization is terminated  or revoked sooner.       Influenza A by PCR NEGATIVE NEGATIVE Final   Influenza B by PCR NEGATIVE NEGATIVE Final    Comment: (NOTE) The Xpert Xpress SARS-CoV-2/FLU/RSV plus assay is intended as an aid in the diagnosis of influenza from Nasopharyngeal swab specimens and should not be used as a sole basis for treatment. Nasal washings and aspirates are unacceptable for Xpert Xpress SARS-CoV-2/FLU/RSV testing.  Fact Sheet for Patients: EntrepreneurPulse.com.au  Fact Sheet for Healthcare Providers: IncredibleEmployment.be  This test is not yet approved or cleared by the [place] FDA and has been authorized for detection and/or diagnosis of SARS-CoV-2 by FDA under an Emergency Use Authorization (EUA). This EUA will remain in effect (meaning this test can be used) for the duration of the COVID-19 declaration under Section 564(b)(1) of the Act, 21 U.S.C. section 360bbb-3(b)(1), unless the authorization is terminated or revoked.     Resp Syncytial Virus by PCR NEGATIVE NEGATIVE Final    Comment: (NOTE) Fact Sheet for Patients: EntrepreneurPulse.com.au  Fact Sheet for Healthcare Providers: IncredibleEmployment.be  This test is not yet approved or cleared by the [place] FDA and has been authorized for detection and/or diagnosis of SARS-CoV-2 by FDA under an Emergency Use Authorization (EUA). This EUA will remain in effect (meaning this test can be used) for the duration of the COVID-19 declaration under Section 564(b)(1) of the Act, 21 U.S.C. section 360bbb-3(b)(1), unless the authorization is terminated or revoked.  Performed at [facility], [address] [place], [place] [address]   Group A Strep by PCR     Status: None   Collection Time: 06/10/22  6:55 PM   Specimen: Throat;  Sterile Swab  Result Value Ref Range Status   Group A Strep by PCR NOT DETECTED NOT DETECTED Final    Comment: Performed at [facility], [place], [place] [address]  Respiratory (~20 pathogens) panel by PCR     Status: None   Collection Time: 06/10/22 11:36 PM   Specimen: Nasopharyngeal Swab; Respiratory  Result Value Ref Range Status   Adenovirus NOT DETECTED NOT DETECTED Final   Coronavirus 229E NOT DETECTED NOT DETECTED Final    Comment: (NOTE) The Coronavirus on the Respiratory Panel, DOES NOT test for the novel  Coronavirus (2019 nCoV)    Coronavirus HKU1 NOT DETECTED NOT DETECTED Final   Coronavirus NL63 NOT DETECTED NOT DETECTED Final   Coronavirus OC43 NOT  DETECTED NOT DETECTED Final   Metapneumovirus NOT DETECTED NOT DETECTED Final   Rhinovirus / Enterovirus NOT DETECTED NOT DETECTED Final   Influenza A NOT DETECTED NOT DETECTED Final   Influenza B NOT DETECTED NOT DETECTED Final   Parainfluenza Virus 1 NOT DETECTED NOT DETECTED Final   Parainfluenza Virus 2 NOT DETECTED NOT DETECTED Final   Parainfluenza Virus 3 NOT DETECTED NOT DETECTED Final   Parainfluenza Virus 4 NOT DETECTED NOT DETECTED Final   Respiratory Syncytial Virus NOT DETECTED NOT DETECTED Final   Bordetella pertussis NOT DETECTED NOT DETECTED Final   Bordetella Parapertussis NOT DETECTED NOT DETECTED Final   Chlamydophila pneumoniae NOT DETECTED NOT DETECTED Final   Mycoplasma pneumoniae NOT DETECTED NOT DETECTED Final    Comment: Performed at [facility], [place] [address] [place], [place] [address]         Radiology Studies: DG Chest 2 View  Result Date: 06/10/2022 CLINICAL DATA:  Weakness. EXAM: CHEST - 2 VIEW COMPARISON:  Chest x-ray dated April 01, 2016. FINDINGS: The heart size and mediastinal contours are within normal limits. Both lungs are clear. The visualized skeletal structures are unremarkable. IMPRESSION: No active cardiopulmonary disease. Electronically  Signed   By: Titus Dubin M.D.   On: 06/10/2022 16:39        Scheduled Meds:  atenolol  50 mg Oral q morning   enoxaparin (LOVENOX) injection  40 mg Subcutaneous Q24H   irbesartan  150 mg Oral QPM   rosuvastatin  20 mg Oral QHS   sertraline  150 mg Oral Daily   Continuous Infusions:  ceFEPime (MAXIPIME) IV Stopped (06/10/22 2332)   metronidazole Stopped (06/11/22 0305)   [START ON 06/12/2022] vancomycin       LOS: 1 day     Desma Maxim, MD [facility]   If 7PM-7AM, please contact night-coverage [email or web address] Password [email or web address] 06/11/2022, 8:50 AM

## 2022-06-11 NOTE — Progress Notes (Signed)
   06/11/22 2043  Assess: MEWS Score  Temp (!) 101.1 F (38.4 C)  BP (!) 153/61  MAP (mmHg) 89  Pulse Rate 65  Resp (!) 23  SpO2 93 %  O2 Device Room Air  Assess: MEWS Score  MEWS Temp 1  MEWS Systolic 0  MEWS Pulse 0  MEWS RR 1  MEWS LOC 0  MEWS Score 2  MEWS Score Color Yellow  Assess: if the MEWS score is Yellow or Red  Were vital signs taken at a resting state? Yes  Focused Assessment No change from prior assessment  Does the patient meet 2 or more of the SIRS criteria? Yes  Does the patient have a confirmed or suspected source of infection? Yes  Provider and Rapid Response Notified? No  MEWS guidelines implemented *See Row Information* Yes  Treat  MEWS Interventions Administered prn meds/treatments  Pain Scale 0-10  Pain Score 0  Take Vital Signs  Increase Vital Sign Frequency  Yellow: Q 2hr X 2 then Q 4hr X 2, if remains yellow, continue Q 4hrs  Escalate  MEWS: Escalate Yellow: discuss with charge nurse/RN and consider discussing with provider and RRT  Notify: Charge Nurse/RN  Name of Charge Nurse/RN Notified Jerral Bonito  Date Charge Nurse/RN Notified 06/11/22  Time Charge Nurse/RN Notified 2045  Document  Patient Outcome Other (Comment) (PRN tylenol given, will continue to monitor)  Progress note created (see row info) Yes  Assess: SIRS CRITERIA  SIRS Temperature  1  SIRS Pulse 0  SIRS Respirations  1  SIRS WBC 0  SIRS Score Sum  2

## 2022-06-12 DIAGNOSIS — I10 Essential (primary) hypertension: Secondary | ICD-10-CM

## 2022-06-12 DIAGNOSIS — E1122 Type 2 diabetes mellitus with diabetic chronic kidney disease: Secondary | ICD-10-CM | POA: Diagnosis not present

## 2022-06-12 DIAGNOSIS — R531 Weakness: Secondary | ICD-10-CM | POA: Diagnosis not present

## 2022-06-12 DIAGNOSIS — N182 Chronic kidney disease, stage 2 (mild): Secondary | ICD-10-CM

## 2022-06-12 DIAGNOSIS — R509 Fever, unspecified: Secondary | ICD-10-CM | POA: Diagnosis not present

## 2022-06-12 LAB — BASIC METABOLIC PANEL
Anion gap: 8 (ref 5–15)
BUN: 12 mg/dL (ref 8–23)
CO2: 23 mmol/L (ref 22–32)
Calcium: 8.4 mg/dL — ABNORMAL LOW (ref 8.9–10.3)
Chloride: 103 mmol/L (ref 98–111)
Creatinine, Ser: 0.94 mg/dL (ref 0.44–1.00)
GFR, Estimated: >60 mL/min (ref >60)
Glucose, Bld: 114 mg/dL — ABNORMAL HIGH (ref 70–99)
Potassium: 3.3 mmol/L — ABNORMAL LOW (ref 3.5–5.1)
Sodium: 134 mmol/L — ABNORMAL LOW (ref 135–145)

## 2022-06-12 LAB — CBC
HCT: 35.6 % — ABNORMAL LOW (ref 36.0–46.0)
Hemoglobin: 11.5 g/dL — ABNORMAL LOW (ref 12.0–15.0)
MCH: 25.3 pg — ABNORMAL LOW (ref 26.0–34.0)
MCHC: 32.3 g/dL (ref 30.0–36.0)
MCV: 78.2 fL — ABNORMAL LOW (ref 80.0–100.0)
Platelets: 111 10*3/uL — ABNORMAL LOW (ref 150–400)
RBC: 4.55 MIL/uL (ref 3.87–5.11)
RDW: 15.2 % (ref 11.5–15.5)
WBC: 3 10*3/uL — ABNORMAL LOW (ref 4.0–10.5)
nRBC: 0 % (ref 0.0–0.2)

## 2022-06-12 LAB — C DIFFICILE QUICK SCREEN W PCR REFLEX
C Diff antigen: NEGATIVE
C Diff interpretation: NOT DETECTED
C Diff toxin: NEGATIVE

## 2022-06-12 LAB — URINE CULTURE: Culture: NO GROWTH

## 2022-06-12 LAB — IRON AND TIBC
Iron: 26 ug/dL — ABNORMAL LOW (ref 28–170)
Saturation Ratios: 6 % — ABNORMAL LOW (ref 10.4–31.8)
TIBC: 419 ug/dL (ref 250–450)
UIBC: 393 ug/dL

## 2022-06-12 LAB — LEGIONELLA PNEUMOPHILA SEROGP 1 UR AG: L. pneumophila Serogp 1 Ur Ag: NEGATIVE

## 2022-06-12 LAB — C-REACTIVE PROTEIN: CRP: <0.5 mg/dL (ref <1.0)

## 2022-06-12 LAB — PROCALCITONIN: Procalcitonin: <0.1 ng/mL

## 2022-06-12 LAB — VITAMIN B12: Vitamin B-12: 408 pg/mL (ref 180–914)

## 2022-06-12 MED ORDER — POTASSIUM CHLORIDE CRYS ER 20 MEQ PO TBCR
20.0000 meq | EXTENDED_RELEASE_TABLET | Freq: Once | ORAL | Status: AC
  Administered 2022-06-12: 20 meq via ORAL
  Filled 2022-06-12: qty 1

## 2022-06-12 MED ORDER — VANCOMYCIN HCL IN DEXTROSE 1-5 GM/200ML-% IV SOLN
1000.0000 mg | INTRAVENOUS | Status: DC
  Administered 2022-06-12: 1000 mg via INTRAVENOUS
  Filled 2022-06-12 (×2): qty 200

## 2022-06-12 MED ORDER — AZITHROMYCIN 500 MG PO TABS: 500.0000 mg | ORAL_TABLET | Freq: Every day | ORAL | Status: DC

## 2022-06-12 MED ORDER — POLYSACCHARIDE IRON COMPLEX 150 MG PO CAPS
150.0000 mg | ORAL_CAPSULE | Freq: Every day | ORAL | Status: DC
  Administered 2022-06-12 – 2022-06-14 (×3): 150 mg via ORAL
  Filled 2022-06-12 (×3): qty 1

## 2022-06-12 MED ORDER — SERTRALINE HCL 50 MG PO TABS
100.0000 mg | ORAL_TABLET | Freq: Every day | ORAL | Status: DC
  Administered 2022-06-12 – 2022-06-14 (×3): 100 mg via ORAL
  Filled 2022-06-12 (×3): qty 2

## 2022-06-12 MED ORDER — SODIUM CHLORIDE 0.9 % IV SOLN: 2.0000 g | INTRAVENOUS | Status: DC

## 2022-06-12 NOTE — Evaluation (Signed)
Physical Therapy Evaluation Patient Details Name: Michele [name] MRN: [id number] DOB: 01-14-1946 Today's Date: 06/12/2022  History of Present Illness  Pt admitted for fever with complaints of weakness. History includes HTN, anxiety, and DM.  Clinical Impression  Pt is a pleasant 77 year old female who was admitted for fever and complaints of weakness. Pt performs bed mobility with mod assist, transfers with min assist, and ambulation with cga and RW. Pt demonstrates deficits with strength/mobility/endurance. Would benefit from skilled PT to address above deficits and promote optimal return to PLOF. Recommend transition to [place] upon discharge from acute hospitalization.      Recommendations for follow up therapy are one component of a multi-disciplinary discharge planning process, led by the attending physician.  Recommendations may be updated based on patient status, additional functional criteria and insurance authorization.  Follow Up Recommendations Home health PT      Assistance Recommended at Discharge Intermittent Supervision/Assistance  Patient can return home with the following  A little help with walking and/or transfers;A little help with bathing/dressing/bathroom;Help with stairs or ramp for entrance    Equipment Recommendations Rolling walker (2 wheels)  Recommendations for Other Services       Functional Status Assessment Patient has had a recent decline in their functional status and demonstrates the ability to make significant improvements in function in a reasonable and predictable amount of time.     Precautions / Restrictions Precautions Precautions: Fall Restrictions Weight Bearing Restrictions: No      Mobility  Bed Mobility Overal bed mobility: Needs Assistance Bed Mobility: Supine to Sit     Supine to sit: Mod assist     General bed mobility comments: needs assist for trunkal elevation. Once seated, upright posture. INcreased tremors noted     Transfers Overall transfer level: Needs assistance Equipment used: Rolling walker (2 wheels) Transfers: Sit to/from Stand Sit to Stand: Min assist           General transfer comment: needs assist and demonstrates post lean once standing, able to self correct with cues. RW used    Ambulation/Gait Ambulation/Gait assistance: Min Conservator, museum/gallery (Feet): 200 Feet Assistive device: Rolling walker (2 wheels) Gait Pattern/deviations: Step-through pattern       General Gait Details: unsteadiness noted. Ambulated 10' without AD with min assist. RW used  Stairs            Wheelchair Mobility    Modified Rankin (Stroke Patients Only)       Balance Overall balance assessment: Needs assistance Sitting-balance support: Feet supported Sitting balance-Leahy Scale: Fair     Standing balance support: Bilateral upper extremity supported Standing balance-Leahy Scale: Fair                               Pertinent Vitals/Pain Pain Assessment Pain Assessment: No/denies pain    Home Living Family/patient expects to be discharged to:: Private residence Living Arrangements: Spouse/significant other Available Help at Discharge: Family Type of Home: House Home Access: Ramped entrance       Home Layout: One level Home Equipment: None      Prior Function Prior Level of Function : Independent/Modified Independent             Mobility Comments: very active ADLs Comments: indep     Hand Dominance        Extremity/Trunk Assessment   Upper Extremity Assessment Upper Extremity Assessment: Overall WFL for tasks assessed  Lower Extremity Assessment Lower Extremity Assessment: Overall WFL for tasks assessed       Communication   Communication: No difficulties  Cognition Arousal/Alertness: Awake/alert Behavior During Therapy: WFL for tasks assessed/performed Overall Cognitive Status: Within Functional Limits for tasks assessed                                           General Comments      Exercises Other Exercises Other Exercises: ambulated to bathroom with cga. Pt with incontient episode on the way to the bathroom. Needs cga for hygiene including changing socks and cleaning floor.   Assessment/Plan    PT Assessment Patient needs continued PT services  PT Problem List Decreased strength;Decreased balance;Decreased mobility       PT Treatment Interventions Gait training;DME instruction;Therapeutic exercise;Balance training    PT Goals (Current goals can be found in the Care Plan section)  Acute Rehab PT Goals Patient Stated Goal: to go home PT Goal Formulation: With patient Time For Goal Achievement: 06/26/22 Potential to Achieve Goals: Good    Frequency Min 2X/week     Co-evaluation               AM-PAC PT "6 Clicks" Mobility  Outcome Measure Help needed turning from your back to your side while in a flat bed without using bedrails?: A Little Help needed moving from lying on your back to sitting on the side of a flat bed without using bedrails?: A Little Help needed moving to and from a bed to a chair (including a wheelchair)?: A Little Help needed standing up from a chair using your arms (e.g., wheelchair or bedside chair)?: A Little Help needed to walk in hospital room?: A Little Help needed climbing 3-5 steps with a railing? : A Little 6 Click Score: 18    End of Session Equipment Utilized During Treatment: Gait belt Activity Tolerance: Patient tolerated treatment well Patient left: in bed;with bed alarm set Nurse Communication: Mobility status PT Visit Diagnosis: Unsteadiness on feet (R26.81);Muscle weakness (generalized) (M62.81);Difficulty in walking, not elsewhere classified (R26.2)    Time: 9518-8416 PT Time Calculation (min) (ACUTE ONLY): 26 min   Charges:   PT Evaluation $PT Eval Low Complexity: 1 Low PT Treatments $Gait Training: 8-22 mins        Michele [name], PT, DPT, GCS [phone]   Michele [name] 06/12/2022, 4:32 PM

## 2022-06-12 NOTE — TOC Initial Note (Addendum)
Transition of Care [facility]) - Initial/Assessment Note    Patient Details  Name: Michele [name] MRN: [id number] Date of Birth: 31-May-1946  [facility]) CM/SW Contact:    Gerilyn Pilgrim, LCSW Phone Number: 06/12/2022, 2:31 PM  Clinical Narrative:  SW spoke with patient regarding therapy recommendation for [facility]. Pt agreeable to this and does not have a preference for agency. Pt lives alone with husband and states that she does not use any DME at home but is interested in getting a walker. Pt is still currently seeing Dr. Artis Flock for primary care.  SW to order walker and HH PT/OT                2:55pm [facility] accepted for PT/OT   Expected Discharge Plan: [place] Barriers to Discharge: Continued Medical Work up   Patient Goals and CMS Choice Patient states their goals for this hospitalization and ongoing recovery are:: return home with [facility] CMS Medicare.gov Compare Post Acute Care list provided to:: Patient Choice offered to / list presented to : Patient      Expected Discharge Plan and Services       Living arrangements for the past 2 months: Single Family Home                 DME Arranged: Walker rolling DME Agency: [employer]                  Prior Living Arrangements/Services Living arrangements for the past 2 months: [place]   Patient language and need for interpreter reviewed:: Yes        Need for Family Participation in Patient Care: Yes (Comment) Care giver support system in place?: Yes (comment) Current home services: DME, Home OT, Home PT    Activities of Daily Living Home Assistive Devices/Equipment: Dentures (specify type) (upper partial) ADL Screening (condition at time of admission) Patient's cognitive ability adequate to safely complete daily activities?: Yes Is the patient deaf or have difficulty hearing?: No Does the patient have difficulty seeing, even when wearing glasses/contacts?: No Does the  patient have difficulty concentrating, remembering, or making decisions?: No Patient able to express need for assistance with ADLs?: Yes Does the patient have difficulty dressing or bathing?: No Independently performs ADLs?: Yes (appropriate for developmental age) Does the patient have difficulty walking or climbing stairs?: No Weakness of Legs: None Weakness of Arms/Hands: None  Permission Sought/Granted Permission sought to share information with : [facility] administrator granted to share info w AGENCY: [facility] agency  Permission granted to share info w Relationship: husband     Emotional Assessment       Orientation: : Oriented to Situation, Oriented to Self, Oriented to Place, Oriented to  Time      Admission diagnosis:  Weakness [R53.1] Febrile illness [R50.9] Patient Active Problem List   Diagnosis Date Noted   Weakness 06/10/2022   Fever 06/10/2022   Hyponatremia 06/10/2022   HLD (hyperlipidemia) 06/10/2022   Lumbar spondylosis 01/01/2021   Osteopenia 06/08/2020   Depression, major, recurrent, in partial remission ([place]) 11/02/2019   Anxiety 12/10/2018   Sleep apnea on CPAP 09/18/2018   Vitiligo 11/09/2017   Type 2 diabetes mellitus ([place]) 07/24/2017   Overweight (BMI 25.0-29.9) 07/24/2017   Cerebrovascular disease 01/02/2017   History of hyperthyroidism 09/11/2015   Vitamin D deficiency 06/28/2013   CKD stage 2 due to type 2 diabetes mellitus ([place])    Hyperlipidemia  associated with type 2 diabetes mellitus ([place]) 03/21/2008   Essential hypertension 03/21/2008   GERD 03/21/2008   Fatty liver 03/21/2008   COLONIC POLYPS, ADENOMATOUS, HX OF 03/21/2008   PCP:  [name], MD Pharmacy:   [facility] [address], [place] - [place] [address] Phone: [phone] Fax: [phone]  [id number] , [place] [address]: [phone]: [phone] [facility] [place] [address] [place]Phone: [phone]: [phone]    Social Determinants of Health (SDOH) Social History: [place] No Food Insecurity (06/11/2022)  Housing: Low Risk  (06/11/2022)  Transportation Needs: No Transportation Needs (06/11/2022)  Utilities: Not At Risk (06/11/2022)  Depression (PHQ2-9): Low Risk  (07/05/2021)  Tobacco Use: Low Risk  (06/11/2022)   SDOH Interventions:     Readmission Risk Interventions     No data to display
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
## 2022-06-12 NOTE — Progress Notes (Signed)
Date of Admission:  06/10/2022     ID: Michele [name] is a 77 y.o. female  Principal Problem:   Fever Active Problems:   Hyperlipidemia associated with type 2 diabetes mellitus ([place])   Essential hypertension   GERD   CKD stage 2 due to type 2 diabetes mellitus ([place])   Type 2 diabetes mellitus ([place])   Sleep apnea on CPAP   Anxiety   Depression, major, recurrent, in partial remission (HCC)   Weakness   Hyponatremia   HLD (hyperlipidemia)    Subjective: Pt says she is feeling better Walked with PT Still feeling a bit shaky Back pain not significant Medications:   enoxaparin (LOVENOX) injection  40 mg Subcutaneous Q24H   irbesartan  150 mg Oral QPM   oxybutynin  10 mg Oral QHS   rosuvastatin  20 mg Oral QHS   sertraline  100 mg Oral Daily    Objective: Vital signs in last 24 hours: Patient Vitals for the past 24 hrs:  BP Temp Temp src Pulse Resp SpO2 Height Weight  06/12/22 1637 (!) 187/65 (!) 100.8 F (38.2 C) Oral 67 17 97 % -- --  06/12/22 1144 (!) 147/49 98.4 F (36.9 C) -- (!) 59 17 94 % -- --  06/12/22 0839 135/89 98.5 F (36.9 C) -- 63 17 96 % -- --  06/12/22 0714 -- -- -- (!) 53 -- -- -- --  06/12/22 0304 (!) 109/51 (!) 97.5 F (36.4 C) Rectal (!) 42 20 100 % -- --  06/12/22 0040 (!) 114/46 97.6 F (36.4 C) Oral (!) 58 (!) 23 93 % -- --  06/11/22 2329 (!) 109/92 98.7 F (37.1 C) Oral (!) 57 (!) 26 95 % -- --  06/11/22 2226 (!) 132/53 (!) 102.7 F (39.3 C) -- 66 (!) 28 90 % -- --  06/11/22 2110 -- -- -- -- -- -- '5\' 3"'$  (1.6 m) 70.4 kg  06/11/22 2057 (!) 161/57 (!) 102.9 F (39.4 C) Oral 66 -- -- -- --  06/11/22 2043 (!) 153/61 (!) 101.1 F (38.4 C) -- 65 (!) 23 93 % -- --      PHYSICAL EXAM:  General: Alert, cooperative, no distress, appears stated age.  Oral cavity       Lungs: Clear to auscultation bilaterally. No Wheezing or Rhonchi. No rales. Heart: Regular rate and rhythm, no murmur, rub or gallop. Abdomen: Soft, non-tender,not  distended. Bowel sounds normal. No masses Extremities: atraumatic, no cyanosis. No edema. No clubbing Skin: vitiligo Lymph: Cervical, supraclavicular normal. Neurologic: Grossly non-focal  Lab Results Recent Labs    06/11/22 0425 06/12/22 0437  WBC 4.9 3.0*  HGB 10.4* 11.5*  HCT 32.3* 35.6*  NA 132* 134*  K 3.5 3.3*  CL 104 103  CO2 20* 23  BUN 14 12  CREATININE 0.98 0.94   Liver Panel Recent Labs    06/10/22 1531  PROT 6.9  ALBUMIN 4.0  AST 42*  ALT 28  ALKPHOS 51  BILITOT 1.2   Sedimentation Rate Recent Labs    06/10/22 2336  ESRSEDRATE 12   C-Reactive Protein Recent Labs    06/12/22 0437  CRP <0.5    Microbiology:  Studies/Results: MR LUMBAR SPINE WO CONTRAST  Result Date: 06/11/2022 CLINICAL DATA:  Pain, fever and generalized weakness. EXAM: MRI THORACIC AND LUMBAR SPINE WITHOUT CONTRAST TECHNIQUE: Multiplanar and multiecho pulse sequences of the thoracic and lumbar spine were obtained without intravenous contrast. COMPARISON:  Lumbar spine radiographs from 2022 FINDINGS: MRI  THORACIC SPINE FINDINGS Alignment:  Normal Vertebrae: Slight anterior wedging of the T11 vertebral body. There is abnormal T1 and T2 signal intensity in both the T11 and T12 vertebral bodies and there appears to be a small amount of fluid in the disc space and the endplates are irregular. Findings suspicious for early discitis and osteomyelitis. Recommend correlation with clinical findings such as back pain, white count, fever, ex cetera. Cord:  Normal cord signal intensity.  No cord lesions or syrinx. Paraspinal and other soft tissues: No significant paraspinal findings. No epidural or paraspinal abscess. Disc levels: Central disc protrusions are noted at T7-8 and T8-9 with mild mass effect on the ventral thecal sac at both these levels. Mild diffuse bulging discs at T10-11 and T11-12. MRI LUMBAR SPINE FINDINGS Segmentation: There are five lumbar type vertebral bodies. The last full  intervertebral disc space is labeled L5-S1. Alignment: Overall normal alignment. Mild degenerative anterolisthesis of L4. Vertebrae: Normal marrow signal. No bone lesions or fractures. Benign L1 hemangioma. No findings for discitis or osteomyelitis in the lumbar spine. Conus medullaris and cauda equina: Conus extends to the L1 level. Conus and cauda equina appear normal. Paraspinal and other soft tissues: No significant paraspinal or retroperitoneal findings but study is quite limited due to motion artifact. Disc levels: Very limited axial images due to significant patient motion. No significant disc protrusions, spinal or foraminal stenosis on the sagittal images. IMPRESSION: 1. MR findings suspicious for early discitis and osteomyelitis at T11-12. Recommend correlation with clinical findings such as back pain, white count, fever, ex cetera. A repeat study with contrast may be helpful for further assessment. No obvious paraspinal or epidural abscess. 2. Central disc protrusions at T7-8 and T8-9. 3. Mild diffuse bulging discs at T10-11 and T11-12. 4. Limited examination of the lumbar spine disc spaces due to patient motion but no findings for discitis/osteomyelitis or significant spinal or foraminal stenosis in the lumbar spine. These results will be called to the ordering clinician or representative by the Radiologist Assistant, and communication documented in the PACS or [employer]. Electronically Signed   By: Marijo Sanes M.D.   On: 06/11/2022 11:21   MR THORACIC SPINE WO CONTRAST  Result Date: 06/11/2022 CLINICAL DATA:  Pain, fever and generalized weakness. EXAM: MRI THORACIC AND LUMBAR SPINE WITHOUT CONTRAST TECHNIQUE: Multiplanar and multiecho pulse sequences of the thoracic and lumbar spine were obtained without intravenous contrast. COMPARISON:  Lumbar spine radiographs from 2022 FINDINGS: MRI THORACIC SPINE FINDINGS Alignment:  Normal Vertebrae: Slight anterior wedging of the T11 vertebral body.  There is abnormal T1 and T2 signal intensity in both the T11 and T12 vertebral bodies and there appears to be a small amount of fluid in the disc space and the endplates are irregular. Findings suspicious for early discitis and osteomyelitis. Recommend correlation with clinical findings such as back pain, white count, fever, ex cetera. Cord:  Normal cord signal intensity.  No cord lesions or syrinx. Paraspinal and other soft tissues: No significant paraspinal findings. No epidural or paraspinal abscess. Disc levels: Central disc protrusions are noted at T7-8 and T8-9 with mild mass effect on the ventral thecal sac at both these levels. Mild diffuse bulging discs at T10-11 and T11-12. MRI LUMBAR SPINE FINDINGS Segmentation: There are five lumbar type vertebral bodies. The last full intervertebral disc space is labeled L5-S1. Alignment: Overall normal alignment. Mild degenerative anterolisthesis of L4. Vertebrae: Normal marrow signal. No bone lesions or fractures. Benign L1 hemangioma. No findings for discitis or osteomyelitis in  the lumbar spine. Conus medullaris and cauda equina: Conus extends to the L1 level. Conus and cauda equina appear normal. Paraspinal and other soft tissues: No significant paraspinal or retroperitoneal findings but study is quite limited due to motion artifact. Disc levels: Very limited axial images due to significant patient motion. No significant disc protrusions, spinal or foraminal stenosis on the sagittal images. IMPRESSION: 1. MR findings suspicious for early discitis and osteomyelitis at T11-12. Recommend correlation with clinical findings such as back pain, white count, fever, ex cetera. A repeat study with contrast may be helpful for further assessment. No obvious paraspinal or epidural abscess. 2. Central disc protrusions at T7-8 and T8-9. 3. Mild diffuse bulging discs at T10-11 and T11-12. 4. Limited examination of the lumbar spine disc spaces due to patient motion but no findings  for discitis/osteomyelitis or significant spinal or foraminal stenosis in the lumbar spine. These results will be called to the ordering clinician or representative by the Radiologist Assistant, and communication documented in the PACS or [employer]. Electronically Signed   By: Marijo Sanes M.D.   On: 06/11/2022 11:21   CT HEAD WO CONTRAST (5MM)  Result Date: 06/11/2022 CLINICAL DATA:  77 year old female with fever and encephalopathy. EXAM: CT HEAD WITHOUT CONTRAST TECHNIQUE: Contiguous axial images were obtained from the base of the skull through the vertex without intravenous contrast. RADIATION DOSE REDUCTION: This exam was performed according to the departmental dose-optimization program which includes automated exposure control, adjustment of the mA and/or kV according to patient size and/or use of iterative reconstruction technique. COMPARISON:  Brain MRI 08/23/2015.  Head CT 08/23/2015. FINDINGS: Brain: Cerebral volume is not significantly changed. 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 Also new is hypodensity in the left pons which is age indeterminate (same image). No superimposed midline shift, ventriculomegaly, mass effect, evidence of mass lesion, intracranial hemorrhage or evidence of cortically based acute infarction. Vascular: Calcified atherosclerosis at the skull base. No suspicious intracranial vascular hyperdensity. Skull: No acute osseous abnormality identified. Sinuses/Orbits: Chronic paranasal sinus inflammation not significantly changed from 2017. Chronic bubbly opacity in the left sphenoid. Tympanic cavities and mastoids remain well aerated. Other: Postoperative changes to both globes since 2017. No acute orbit or scalp soft tissue finding. IMPRESSION: 1. Chronically advanced small vessel disease, but progressed  since 2017 and age indeterminate in the left pons. Query right side weakness. 2. No other acute intracranial abnormality identified. 3. Chronic paranasal sinus disease. Electronically Signed   By: Genevie Ann M.D.   On: 06/11/2022 09:21   DG Chest 2 View  Result Date: 06/10/2022 CLINICAL DATA:  Weakness. EXAM: CHEST - 2 VIEW COMPARISON:  Chest x-ray dated April 01, 2016. FINDINGS: The heart size and mediastinal contours are within normal limits. Both lungs are clear. The visualized skeletal structures are unremarkable. IMPRESSION: No active cardiopulmonary disease. Electronically Signed   By: Titus Dubin M.D.   On: 06/10/2022 16:39    MRI thoracic spine- more of degenerative changes  Assessment/Plan: Fever - unclear etiology- there is a temperature - heart rate discordance So far blood culture, viral pcr, have been neg Legionella urine neg ( but this detects only serogroup 1)- ???azithromycin /levaquin if fever persist CXR N ESR 12 CRP < 0.5 Lactate 1 MRI spine T11-T12- chronic degenerative changes- this is less likely to be infection especially with normal ESR/CRP- discussed with [facility] radiologist  WD/c vanco and ceftriaxone  as no response in fever as well- will get a repeat MRI in 2-4 weeks to see if there is progression  Could the fever and shaky feeling be due to serotonin syndrome as the dose of zoloft was increased recently?   Hematoma tongue? Cause  ? Trauma ( bite) observe  Discussed the management with patient , husband

## 2022-06-12 NOTE — Final Progress Note (Signed)
       CROSS COVER NOTE  NAME: Michele [name] MRN: [id number] DOB : 10/23/1945 ATTENDING PHYSICIAN: Gwynne Edinger, MD    Date of Service   06/12/2022   HPI/Events of Note   Notified of bradycardia HR 42 BP 109/51. Patient is asymptomatic.   Interventions   Assessment/Plan:  EKG Telemetry Hold home Atenolol and verapamil       To reach the provider On-Call:   7AM- 7PM see care teams to locate the attending and reach out to them via [email or web address] Password: [email or web address] 7PM-7AM contact night-coverage If you still have difficulty reaching the appropriate provider, please page the [facility] (Director on Call) for [facility] on amion for assistance  This document was prepared using Systems analyst and may include unintentional dictation errors.  Neomia Glass DNP, MBA, FNP-BC Nurse Practitioner [facility] Pager [phone]

## 2022-06-12 NOTE — TOC CM/SW Note (Signed)
  Transition of Care (TOC) Screening Note   Patient Details  Name: ARLET MARTER Date of Birth: [birth year]   Transition of Care [facility]) CM/SW Contact:    Gerilyn Pilgrim, LCSW Phone Number: 06/12/2022, 8:54 AM    Transition of Care Department [facility]) has reviewed patient and no TOC needs have been identified at this time. We will continue to monitor patient advancement through interdisciplinary progression rounds. If new patient transition needs arise, please place a TOC consult.

## 2022-06-12 NOTE — Progress Notes (Signed)
[facility] [place] [facility] [place] NAME: Michele [name]    MR#:  [id number]  DATE OF BIRTH:  [birth year]  SUBJECTIVE:  Came in with fever and weakness. She is afebrile now Some loose stool per RN Pt reports chronic back pin Denies any fall, h/o seizures or tongue bite. Denies any oral pain  VITALS:  Blood pressure (!) 147/49, pulse (!) 59, temperature 98.4 F (36.9 C), resp. rate 17, height '5\' 3"'$  (1.6 m), weight 70.4 kg, SpO2 94 %.  PHYSICAL EXAMINATION:   GENERAL:  77 y.o.-year-old patient lying in the bed with no acute distress. Tongue hematoma/bruise LUNGS: Normal breath sounds bilaterally, no wheezing CARDIOVASCULAR: S1, S2 normal. No murmur   ABDOMEN: Soft, nontender, nondistended. Bowel sounds present.  EXTREMITIES: No  edema b/l.    NEUROLOGIC: nonfocal  patient is alert and awake SKIN: No obvious rash, lesion, or ulcer.   LABORATORY PANEL:  CBC Recent Labs  Lab 06/12/22 0437  WBC 3.0*  HGB 11.5*  HCT 35.6*  PLT 111*    Chemistries  Recent Labs  Lab 06/10/22 1531 06/10/22 2016 06/11/22 0425 06/12/22 0437  NA 129*  --  132* 134*  K 3.6  --  3.5 3.3*  CL 98  --  104 103  CO2 20*  --  20* 23  GLUCOSE 150*  --  137* 114*  BUN 17  --  14 12  CREATININE 1.06*  --  0.98 0.94  CALCIUM 9.1  --  8.1* 8.4*  MG  --    < > 2.1  --   AST 42*  --   --   --   ALT 28  --   --   --   ALKPHOS 51  --   --   --   BILITOT 1.2  --   --   --    < > = values in this interval not displayed.   Cardiac Enzymes No results for input(s): "TROPONINI" in the last 168 hours. RADIOLOGY:  MR LUMBAR SPINE WO CONTRAST  Result Date: 06/11/2022 CLINICAL DATA:  Pain, fever and generalized weakness. EXAM: MRI THORACIC AND LUMBAR SPINE WITHOUT CONTRAST TECHNIQUE: Multiplanar and multiecho pulse sequences of the thoracic and lumbar spine were obtained without intravenous contrast. COMPARISON:  Lumbar spine radiographs from 2022 FINDINGS: MRI THORACIC SPINE  FINDINGS Alignment:  Normal Vertebrae: Slight anterior wedging of the T11 vertebral body. There is abnormal T1 and T2 signal intensity in both the T11 and T12 vertebral bodies and there appears to be a small amount of fluid in the disc space and the endplates are irregular. Findings suspicious for early discitis and osteomyelitis. Recommend correlation with clinical findings such as back pain, white count, fever, ex cetera. Cord:  Normal cord signal intensity.  No cord lesions or syrinx. Paraspinal and other soft tissues: No significant paraspinal findings. No epidural or paraspinal abscess. Disc levels: Central disc protrusions are noted at T7-8 and T8-9 with mild mass effect on the ventral thecal sac at both these levels. Mild diffuse bulging discs at T10-11 and T11-12. MRI LUMBAR SPINE FINDINGS Segmentation: There are five lumbar type vertebral bodies. The last full intervertebral disc space is labeled L5-S1. Alignment: Overall normal alignment. Mild degenerative anterolisthesis of L4. Vertebrae: Normal marrow signal. No bone lesions or fractures. Benign L1 hemangioma. No findings for discitis or osteomyelitis in the lumbar spine. Conus medullaris and cauda equina: Conus extends to the L1 level. Conus and cauda equina appear normal.  Paraspinal and other soft tissues: No significant paraspinal or retroperitoneal findings but study is quite limited due to motion artifact. Disc levels: Very limited axial images due to significant patient motion. No significant disc protrusions, spinal or foraminal stenosis on the sagittal images. IMPRESSION: 1. MR findings suspicious for early discitis and osteomyelitis at T11-12. Recommend correlation with clinical findings such as back pain, white count, fever, ex cetera. A repeat study with contrast may be helpful for further assessment. No obvious paraspinal or epidural abscess. 2. Central disc protrusions at T7-8 and T8-9. 3. Mild diffuse bulging discs at T10-11 and T11-12. 4.  Limited examination of the lumbar spine disc spaces due to patient motion but no findings for discitis/osteomyelitis or significant spinal or foraminal stenosis in the lumbar spine. These results will be called to the ordering clinician or representative by the Radiologist Assistant, and communication documented in the PACS or [employer]. Electronically Signed   By: Marijo Sanes M.D.   On: 06/11/2022 11:21   MR THORACIC SPINE WO CONTRAST  Result Date: 06/11/2022 CLINICAL DATA:  Pain, fever and generalized weakness. EXAM: MRI THORACIC AND LUMBAR SPINE WITHOUT CONTRAST TECHNIQUE: Multiplanar and multiecho pulse sequences of the thoracic and lumbar spine were obtained without intravenous contrast. COMPARISON:  Lumbar spine radiographs from 2022 FINDINGS: MRI THORACIC SPINE FINDINGS Alignment:  Normal Vertebrae: Slight anterior wedging of the T11 vertebral body. There is abnormal T1 and T2 signal intensity in both the T11 and T12 vertebral bodies and there appears to be a small amount of fluid in the disc space and the endplates are irregular. Findings suspicious for early discitis and osteomyelitis. Recommend correlation with clinical findings such as back pain, white count, fever, ex cetera. Cord:  Normal cord signal intensity.  No cord lesions or syrinx. Paraspinal and other soft tissues: No significant paraspinal findings. No epidural or paraspinal abscess. Disc levels: Central disc protrusions are noted at T7-8 and T8-9 with mild mass effect on the ventral thecal sac at both these levels. Mild diffuse bulging discs at T10-11 and T11-12. MRI LUMBAR SPINE FINDINGS Segmentation: There are five lumbar type vertebral bodies. The last full intervertebral disc space is labeled L5-S1. Alignment: Overall normal alignment. Mild degenerative anterolisthesis of L4. Vertebrae: Normal marrow signal. No bone lesions or fractures. Benign L1 hemangioma. No findings for discitis or osteomyelitis in the lumbar spine. Conus  medullaris and cauda equina: Conus extends to the L1 level. Conus and cauda equina appear normal. Paraspinal and other soft tissues: No significant paraspinal or retroperitoneal findings but study is quite limited due to motion artifact. Disc levels: Very limited axial images due to significant patient motion. No significant disc protrusions, spinal or foraminal stenosis on the sagittal images. IMPRESSION: 1. MR findings suspicious for early discitis and osteomyelitis at T11-12. Recommend correlation with clinical findings such as back pain, white count, fever, ex cetera. A repeat study with contrast may be helpful for further assessment. No obvious paraspinal or epidural abscess. 2. Central disc protrusions at T7-8 and T8-9. 3. Mild diffuse bulging discs at T10-11 and T11-12. 4. Limited examination of the lumbar spine disc spaces due to patient motion but no findings for discitis/osteomyelitis or significant spinal or foraminal stenosis in the lumbar spine. These results will be called to the ordering clinician or representative by the Radiologist Assistant, and communication documented in the PACS or [employer]. Electronically Signed   By: Marijo Sanes M.D.   On: 06/11/2022 11:21   CT HEAD WO CONTRAST (5MM)  Result  Date: 06/11/2022 CLINICAL DATA:  77 year old female with fever and encephalopathy. EXAM: CT HEAD WITHOUT CONTRAST TECHNIQUE: Contiguous axial images were obtained from the base of the skull through the vertex without intravenous contrast. RADIATION DOSE REDUCTION: This exam was performed according to the departmental dose-optimization program which includes automated exposure control, adjustment of the mA and/or kV according to patient size and/or use of iterative reconstruction technique. COMPARISON:  Brain MRI 08/23/2015.  Head CT 08/23/2015. FINDINGS: Brain: Cerebral volume is not significantly changed. Chronic small vessel disease including chronic lacunar infarcts of the right caudate,  brainstem (better demonstrated by MRI) and small chronic appearing infarct of the left cerebellar hemisphere (new since 2017). Also new is hypodensity in the left pons which is age indeterminate (same image). No superimposed midline shift, ventriculomegaly, mass effect, evidence of mass lesion, intracranial hemorrhage or evidence of cortically based acute infarction. Vascular: Calcified atherosclerosis at the skull base. No suspicious intracranial vascular hyperdensity. Skull: No acute osseous abnormality identified. Sinuses/Orbits: Chronic paranasal sinus inflammation not significantly changed from 2017. Chronic bubbly opacity in the left sphenoid. Tympanic cavities and mastoids remain well aerated. Other: Postoperative changes to both globes since 2017. No acute orbit or scalp soft tissue finding. IMPRESSION: 1. Chronically advanced small vessel disease, but progressed since 2017 and age indeterminate in the left pons. Query right side weakness. 2. No other acute intracranial abnormality identified. 3. Chronic paranasal sinus disease. Electronically Signed   By: Genevie Ann M.D.   On: 06/11/2022 09:21   DG Chest 2 View  Result Date: 06/10/2022 CLINICAL DATA:  Weakness. EXAM: CHEST - 2 VIEW COMPARISON:  Chest x-ray dated April 01, 2016. FINDINGS: The heart size and mediastinal contours are within normal limits. Both lungs are clear. The visualized skeletal structures are unremarkable. IMPRESSION: No active cardiopulmonary disease. Electronically Signed   By: Titus Dubin M.D.   On: 06/10/2022 16:39    Assessment and Plan  Ms. Melea Prezioso is a 77 year old female with history of non-insulin-dependent diabetes mellitus, hyperlipidemia, hypertension, who presents emergency department for generalized weakness and fever at home.  Febrile illness abnormal MRI question able diskitis -- RSV COVID flu negative -- chest x-ray clear -- UA negative -- strep negative -- no recent travel -- currently  afebrile -- patient having some loose stool. Discussed with ID Dr. Steva Ready. Will get C. difficile study done. -- Patient currently on IV antibiotics empirically. CRP negative. Per ID who discussed with radiology looks more favorable for chronic DJD. Will await final recommendation.  Hypertension -- resume home meds  Overactive bladder -- continue oxybutynin  Type II diabetes -- sliding scale insulin. Will resume meds at discharge  Generalized anxiety -- discussed with patient regarding her Zoloft no she is okay to continue hundred milligrams daily her original dose  Obstructive sleep apnea -- CPAP QHS  Thrombocytopenia -- appears reactive  Generalized weakness/debility seen by physical therapy recommends home health PT    Procedures: Family communication :Husband on the phone Consults :ID CODE STATUS: Full code DVT Prophylaxis :lovenox Level of care: Telemetry Medical Status is: Inpatient Remains inpatient appropriate because: monitoring fever for one more day    TOTAL TIME TAKING CARE OF THIS PATIENT: 35 minutes.  >50% time spent on counselling and coordination of care  Note: This dictation was prepared with Dragon dictation along with smaller phrase technology. Any transcriptional errors that result from this process are unintentional.  Fritzi Mandes M.D    [facility]   CC: Primary care physician; [name], MD[facility]  [place] [facility]

## 2022-06-12 NOTE — Progress Notes (Signed)
Patient alert and oriented, Yellow and Red MEWS this shift due to fever, PRN meds given with improvement. Hospitalist made aware. Patient HR in the 40's, hospitalist notified, telemetry monitor ordered and EKG ordered, will continue to monitor.

## 2022-06-13 DIAGNOSIS — R509 Fever, unspecified: Secondary | ICD-10-CM | POA: Diagnosis not present

## 2022-06-13 LAB — HIV ANTIBODY (ROUTINE TESTING W REFLEX): HIV Screen 4th Generation wRfx: NONREACTIVE

## 2022-06-13 LAB — RHEUMATOID FACTOR: Rheumatoid fact SerPl-aCnc: <10 IU/mL (ref <14.0)

## 2022-06-13 LAB — ANA W/REFLEX: Anti Nuclear Antibody (ANA): NEGATIVE

## 2022-06-13 MED ORDER — ACETAMINOPHEN 500 MG PO TABS: 500.0000 mg | ORAL_TABLET | Freq: Four times a day (QID) | ORAL | Status: DC | PRN

## 2022-06-13 MED ORDER — LIDOCAINE 5 % EX PTCH
1.0000 | MEDICATED_PATCH | CUTANEOUS | Status: DC
  Administered 2022-06-13: 1 via TRANSDERMAL
  Filled 2022-06-13 (×2): qty 1

## 2022-06-13 MED ORDER — ASPIRIN 325 MG PO TABS
325.0000 mg | ORAL_TABLET | Freq: Every day | ORAL | Status: DC
  Administered 2022-06-13 – 2022-06-14 (×2): 325 mg via ORAL
  Filled 2022-06-13 (×2): qty 1

## 2022-06-13 MED ORDER — HYDRALAZINE HCL 20 MG/ML IJ SOLN
10.0000 mg | Freq: Four times a day (QID) | INTRAMUSCULAR | Status: DC | PRN
  Filled 2022-06-13: qty 1

## 2022-06-13 MED ORDER — ATENOLOL 50 MG PO TABS
50.0000 mg | ORAL_TABLET | Freq: Every morning | ORAL | Status: DC
  Administered 2022-06-13 – 2022-06-14 (×2): 50 mg via ORAL
  Filled 2022-06-13 (×2): qty 1

## 2022-06-13 MED ORDER — VITAMIN D 25 MCG (1000 UNIT) PO TABS
1000.0000 [IU] | ORAL_TABLET | Freq: Every day | ORAL | Status: DC
  Administered 2022-06-13 – 2022-06-14 (×2): 1000 [IU] via ORAL
  Filled 2022-06-13 (×2): qty 1

## 2022-06-13 MED ORDER — MINOXIDIL 10 MG PO TABS
10.0000 mg | ORAL_TABLET | Freq: Every day | ORAL | Status: DC
  Administered 2022-06-13 – 2022-06-14 (×2): 10 mg via ORAL
  Filled 2022-06-13 (×2): qty 1

## 2022-06-13 MED ORDER — CYANOCOBALAMIN 500 MCG PO TABS
500.0000 ug | ORAL_TABLET | Freq: Every day | ORAL | Status: DC
  Administered 2022-06-13 – 2022-06-14 (×2): 500 ug via ORAL
  Filled 2022-06-13 (×2): qty 1

## 2022-06-13 NOTE — Care Management Important Message (Signed)
Important Message  Patient Details  Name: OCEANNA ARRUDA MRN: [id number] Date of Birth: July 20, 1945   Medicare Important Message Given:  N/A - LOS <3 / Initial given by admissions     Juliann Pulse A Shilee Biggs 06/13/2022, 8:17 AM

## 2022-06-13 NOTE — Progress Notes (Signed)
[facility] [place] NAME: Michele [name]    MR#:  [id number]  DATE OF BIRTH:  [birth year]  SUBJECTIVE:  Came in with fever and weakness. She is afebrile now Pt reports chronic back pin Denies any fall, h/o seizures or tongue bite. Denies any oral pain Shakiness resolved BP a bit up today   VITALS:  Blood pressure (!) 159/74, pulse 75, temperature (!) 97.5 F (36.4 C), temperature source Oral, resp. rate 16, height '5\' 3"'$  (1.6 m), weight 70.4 kg, SpO2 98 %.  PHYSICAL EXAMINATION:   GENERAL:  77 y.o.-year-old patient lying in the bed with no acute distress. Tongue hematoma/bruise LUNGS: Normal breath sounds bilaterally, no wheezing CARDIOVASCULAR: S1, S2 normal. No murmur   ABDOMEN: Soft, nontender, nondistended. Bowel sounds present.  EXTREMITIES: No  edema b/l.    NEUROLOGIC: nonfocal  patient is alert and awake. No tremors or hand shakiness   LABORATORY PANEL:  CBC Recent Labs  Lab 06/12/22 0437  WBC 3.0*  HGB 11.5*  HCT 35.6*  PLT 111*     Chemistries  Recent Labs  Lab 06/10/22 1531 06/10/22 2016 06/11/22 0425 06/12/22 0437  NA 129*  --  132* 134*  K 3.6  --  3.5 3.3*  CL 98  --  104 103  CO2 20*  --  20* 23  GLUCOSE 150*  --  137* 114*  BUN 17  --  14 12  CREATININE 1.06*  --  0.98 0.94  CALCIUM 9.1  --  8.1* 8.4*  MG  --    < > 2.1  --   AST 42*  --   --   --   ALT 28  --   --   --   ALKPHOS 51  --   --   --   BILITOT 1.2  --   --   --    < > = values in this interval not displayed.     Assessment and Plan  Ms. Jazleen Robeck is a 77 year old female with history of non-insulin-dependent diabetes mellitus, hyperlipidemia, hypertension, who presents emergency department for generalized weakness and fever at home.  Febrile illness abnormal MRI ?/questionable diskitis -- RSV COVID flu negative -- chest x-ray clear -- UA negative -- strep negative -- no recent travel -- currently afebrile -- patient  having some loose stool. Discussed with ID Dr. Steva Ready. Will get C. difficile study done. -- CRP negative. Per ID who discussed with radiology looks more favorable for chronic DJD. Per ID d/c abxs and f/u MRI in few weeks  Hypertension--Malignant -- resume home meds and cont monitor over one more day  Overactive bladder -- continue oxybutynin  Type II diabetes -- sliding scale insulin. Will resume meds at discharge  Generalized anxiety -- discussed with patient regarding her Zoloft no she is okay to continue 100 mg daily her original dose --no hand tremors or shakiness  Obstructive sleep apnea -- CPAP QHS  Thrombocytopenia -- appears reactive  Generalized weakness/debility seen by physical therapy recommends home health PT  Will monitor BP one more day and d/c in am to Home--pt agreeable She would like to ambulate in the Hallways today--sent message to MT  Procedures: Family communication :Husband on the phone Consults :ID CODE STATUS: Full code DVT Prophylaxis :lovenox Level of care: med -surg Status is: Inpatient Remains inpatient appropriate because: monitoring BPfor one more day    TOTAL TIME TAKING CARE OF THIS PATIENT: 35 minutes.  >  50% time spent on counselling and coordination of care  Note: This dictation was prepared with Dragon dictation along with smaller phrase technology. Any transcriptional errors that result from this process are unintentional.  Fritzi Mandes M.D    [facility]   CC: Primary care physician; [name], [place] at [facility]

## 2022-06-13 NOTE — Progress Notes (Signed)
Mobility Specialist - Progress Note   06/13/22 1226  Mobility  Activity Ambulated independently in hallway  Level of Assistance Modified independent, requires aide device or extra time  Assistive Device None  Distance Ambulated (ft) 320 ft  Activity Response Tolerated well  $Mobility charge 1 Mobility   Pt in bathroom upon entry, utilizing RA. Pt ambulated two laps around NS ModI (extra time), tolerated well. Pt denied any pain, dizziness or SOB throughout session. Pt returned to room, left standing by sink with family member present at bedside.   Candie Mile Mobility Specialist 06/13/22 12:31 PM

## 2022-06-13 NOTE — Progress Notes (Signed)
Date of Admission:  06/10/2022     ID: Michele [name] is a 77 y.o. female  Principal Problem:   Febrile illness Active Problems:   Hyperlipidemia associated with type 2 diabetes mellitus ([place])   Essential hypertension   GERD   CKD stage 2 due to type 2 diabetes mellitus ([place])   Type 2 diabetes mellitus ([place])   Sleep apnea on CPAP   Anxiety   Depression, major, recurrent, in partial remission ([place])   Weakness   Hyponatremia   HLD (hyperlipidemia)    Subjective: Pt says she is feeling better No shaking No fever but getting tylenol 1gram for chronic back pain Says the pain has bene the same for more than 6  month Medications:   aspirin  325 mg Oral Daily   atenolol  50 mg Oral q morning   cholecalciferol  1,000 Units Oral Daily   vitamin B-12  500 mcg Oral Daily   enoxaparin (LOVENOX) injection  40 mg Subcutaneous Q24H   irbesartan  150 mg Oral QPM   iron polysaccharides  150 mg Oral Daily   minoxidil  10 mg Oral Daily   oxybutynin  10 mg Oral QHS   rosuvastatin  20 mg Oral QHS   sertraline  100 mg Oral Daily    Objective: Vital signs in last 24 hours: Patient Vitals for the past 24 hrs:  BP Temp Temp src Pulse Resp SpO2  06/13/22 1329 111/60 -- -- (!) 52 16 --  06/13/22 1001 (!) 159/74 -- -- 75 16 --  06/13/22 0818 (!) 188/74 (!) 97.5 F (36.4 C) Oral 67 14 98 %  06/13/22 0504 (!) 170/88 97.6 F (36.4 C) -- 64 20 99 %  06/12/22 2044 (!) 176/65 98.4 F (36.9 C) Oral 61 17 95 %  06/12/22 1637 (!) 187/65 (!) 100.8 F (38.2 C) Oral 67 17 97 %      PHYSICAL EXAM:  General: Alert, cooperative, no distress, appears stated age.  Oral cavity     Lungs: Clear to auscultation bilaterally. No Wheezing or Rhonchi. No rales. Heart: Regular rate and rhythm, no murmur, rub or gallop. Abdomen: Soft, non-tender,not distended. Bowel sounds normal. No masses Extremities: atraumatic, no cyanosis. No edema. No clubbing Skin: vitiligo Lymph: Cervical, supraclavicular  normal. Neurologic: Grossly non-focal  Lab Results Recent Labs    06/11/22 0425 06/12/22 0437  WBC 4.9 3.0*  HGB 10.4* 11.5*  HCT 32.3* 35.6*  NA 132* 134*  K 3.5 3.3*  CL 104 103  CO2 20* 23  BUN 14 12  CREATININE 0.98 0.94   Liver Panel Recent Labs    06/10/22 1531  PROT 6.9  ALBUMIN 4.0  AST 42*  ALT 28  ALKPHOS 51  BILITOT 1.2   Sedimentation Rate Recent Labs    06/10/22 2336  ESRSEDRATE 12   C-Reactive Protein Recent Labs    06/12/22 0437  CRP <0.5    Microbiology: [facility] - NG Studies/Results: No results found.  MRI thoracic spine- more of degenerative changes  Assessment/Plan: Fever - unclear etiology-is this viral illness   there was a temperature - heart rate discordance So far blood culture, viral pcr, have been neg Legionella urine neg  CXR N ESR 12 CRP < 0.5 Lactate 1 MRI spine T11-T12- chronic degenerative changes- this is less likely to be infection especially with normal ESR/CRP- discussed with [facility] radiologist WD/c vanco and ceftriaxone  as no response in fever as DC tylenol for back pain-so that we can  assess her true temp Lidocaine patch for back pain   will follow her as OP and get a repeat MRI in 4 weeks to see if there is progression  Could the fever and shaking  be due to serotonin syndrome as the dose of zoloft was increased recently?   Hematoma tongue? Cause  ? Trauma ( bite) observe  Discussed the management with patient and hospitalist

## 2022-06-13 NOTE — Plan of Care (Signed)

## 2022-06-14 DIAGNOSIS — R509 Fever, unspecified: Secondary | ICD-10-CM | POA: Diagnosis not present

## 2022-06-14 MED ORDER — SERTRALINE HCL 100 MG PO TABS: 100.0000 mg | ORAL_TABLET | Freq: Every day | ORAL | 0 refills | Status: DC

## 2022-06-14 MED ORDER — POLYSACCHARIDE IRON COMPLEX 150 MG PO CAPS: 150.0000 mg | ORAL_CAPSULE | Freq: Every day | ORAL | 1 refills | Status: DC

## 2022-06-14 NOTE — Discharge Summary (Signed)
Physician Discharge Summary   Patient: Michele [name] MRN: [id number] DOB: 09-14-1945  Admit date:     06/10/2022  Discharge date: 06/14/22  Discharge Physician: Fritzi Mandes   PCP: [name], MD   Recommendations at discharge:   follow-up PCP and one week  Discharge Diagnoses: Principal Problem:   Febrile illness Active Problems:   Hyperlipidemia associated with type 2 diabetes mellitus ([place])   Essential hypertension   GERD   CKD stage 2 due to type 2 diabetes mellitus ([place])   Type 2 diabetes mellitus ([place])   Sleep apnea on CPAP   Anxiety   Depression, major, recurrent, in partial remission ([place])   Weakness   Hyponatremia   HLD (hyperlipidemia)   Assessment and Plan   [name] is a 77 year old female with history of non-insulin-dependent diabetes mellitus, hyperlipidemia, hypertension, who presents emergency department for generalized weakness and fever at home.   Febrile illness--suspect viral illness abnormal MRI ?/questionable diskitis -- RSV COVID flu negative -- chest x-ray clear -- UA negative -- strep negative -- no recent travel -- currently afebrile -- patient having some loose stool. Discussed with ID Dr. Steva Ready.  C. difficile neg. -- CRP negative. Per ID who discussed with radiology looks more favorable for chronic DJD. Per ID d/c abxs and f/u MRI in few weeks   Hypertension--Malignant -- resume home meds and BP much better today   Overactive bladder -- continue oxybutynin   Type II diabetes -- sliding scale insulin. Will resume meds at discharge   Generalized anxiety -- discussed with patient regarding her Zoloft dose she is okay to continue 100 mg daily her original dose --no hand tremors or shakiness   Obstructive sleep apnea -- CPAP QHS   Thrombocytopenia -- appears reactive   Generalized weakness/debility seen by physical therapy recommends home health PT--pt declines HH --she is able to ambulate by herself. Will give  RW.   Much better.d/c today. Pt agreeable   Procedures: Family communication :Husband aware Consults :ID CODE STATUS: Full code DVT Prophylaxis :lovenox Level of care: med -surg      Consultants: ID Procedures performed: none  Disposition: Home Diet recommendation:  Discharge Diet Orders (From admission, onward)     Start     Ordered   06/14/22 0000  Diet - low sodium heart healthy        06/14/22 1047           Cardiac and Carb modified diet DISCHARGE MEDICATION: Allergies as of 06/14/2022       Reactions   Lipitor [atorvastatin]    Elevates LFT's        Medication List     TAKE these medications    acetaminophen 500 MG tablet Commonly known as: TYLENOL Take 500 mg by mouth 3 (three) times daily as needed for moderate pain.   aspirin 325 MG tablet Take 325 mg by mouth daily.   atenolol 50 MG tablet Commonly known as: TENORMIN Take 1 to 2 tablets every Morning for BP                                               /                            TAKE  BY                         MOUTH What changed:  how much to take how to take this when to take this   blood glucose meter kit and supplies Test sugar once a day.   blood glucose meter kit and supplies Kit Check blood sugar 1 time daily-DX-E11.22   Blood Glucose Monitoring Suppl Devi Test sugars once daily.DX diabetes   cloNIDine 0.2 MG tablet Commonly known as: CATAPRES Take      1 tablet       3 x /day        for BP. Take if BP > 160 What changed:  how much to take how to take this when to take this   glucose blood test strip Test  Blood Sugar  Daily  (Dx:  e11.29  ))   iron polysaccharides 150 MG capsule Commonly known as: NIFEREX Take 1 capsule (150 mg total) by mouth daily. Start taking on: June 15, 2022   Lancets Misc Test blood sugar once daily   meloxicam 15 MG tablet Commonly known as: MOBIC Take 1 tablet (15 mg total) by mouth  daily as needed for pain.   metFORMIN 500 MG 24 hr tablet Commonly known as: GLUCOPHAGE-XR Take  2 tablets   2 x /day   with Meals  for Diabetes What changed:  how much to take how to take this when to take this   minoxidil 10 MG tablet Commonly known as: LONITEN Take  1 tablet  Daily for BP                                       /                                                TAKE                       BY                          MOUTH What changed:  how much to take how to take this when to take this   olmesartan 40 MG tablet Commonly known as: BENICAR Take 1 tablet every Evening for BP What changed:  how much to take how to take this when to take this   oxybutynin 10 MG 24 hr tablet Commonly known as: DITROPAN-XL Take  1 tablet  Daily for Bladder Control                                              /                          TAKE                       BY                       MOUTH What changed:  how much to take how to take this when to take this   rosuvastatin 20 MG tablet Commonly known as: CRESTOR Take  1 tablet  Daily for Cholesterol                                                 /                               TAKE                   BY              MOUTH     (?) ONCE DAILY What changed:  how much to take how to take this when to take this   sertraline 100 MG tablet Commonly known as: ZOLOFT Take 1 tablet (100 mg total) by mouth daily. Start taking on: June 15, 2022 What changed:  how much to take how to take this when to take this additional instructions   terbinafine 250 MG tablet Commonly known as: LAMISIL Take   1 tablet  Daily  for Toenail Fungus What changed:  how much to take how to take this when to take this   verapamil 240 MG CR tablet Commonly known as: CALAN-SR Take  1 tablet  Daily  with Food for BP & Heart Rhythm What changed:  how much to take how to take this when to take this   VITAMIN B-12 PO Take 1 tablet by mouth  daily. 5000 mc one time in the AM PRN   VITAMIN D PO Take 10,000 Units by mouth. 2 times a week no W and F        Follow-up Information     [name], MD. Schedule an appointment as soon as possible for a visit in 1 week(s).   Specialty: Internal Medicine Why: hospital f/u Contact information: [address] [place] [address] [phone]                 [place] Weights   06/10/22 1405 06/11/22 2110  Weight: 72.3 kg 70.4 kg     Condition at discharge: fair  The results of significant diagnostics from this hospitalization (including imaging, microbiology, ancillary and laboratory) are listed below for reference.   Imaging Studies: MR LUMBAR SPINE WO CONTRAST  Result Date: 06/11/2022 CLINICAL DATA:  Pain, fever and generalized weakness. EXAM: MRI THORACIC AND LUMBAR SPINE WITHOUT CONTRAST TECHNIQUE: Multiplanar and multiecho pulse sequences of the thoracic and lumbar spine were obtained without intravenous contrast. COMPARISON:  Lumbar spine radiographs from 2022 FINDINGS: MRI THORACIC SPINE FINDINGS Alignment:  Normal Vertebrae: Slight anterior wedging of the T11 vertebral body. There is abnormal T1 and T2 signal intensity in both the T11 and T12 vertebral bodies and there appears to be a small amount of fluid in the disc space and the endplates are irregular. Findings suspicious for early discitis and osteomyelitis. Recommend correlation with clinical findings such as back pain, white count, fever, ex cetera. Cord:  Normal cord signal intensity.  No cord lesions or syrinx. Paraspinal and other soft tissues: No significant paraspinal findings. No epidural or paraspinal abscess. Disc levels: Central disc protrusions are noted at T7-8 and T8-9 with mild mass effect on the ventral thecal sac at  both these levels. Mild diffuse bulging discs at T10-11 and T11-12. MRI LUMBAR SPINE FINDINGS Segmentation: There are five lumbar type vertebral bodies. The last  full intervertebral disc space is labeled L5-S1. Alignment: Overall normal alignment. Mild degenerative anterolisthesis of L4. Vertebrae: Normal marrow signal. No bone lesions or fractures. Benign L1 hemangioma. No findings for discitis or osteomyelitis in the lumbar spine. Conus medullaris and cauda equina: Conus extends to the L1 level. Conus and cauda equina appear normal. Paraspinal and other soft tissues: No significant paraspinal or retroperitoneal findings but study is quite limited due to motion artifact. Disc levels: Very limited axial images due to significant patient motion. No significant disc protrusions, spinal or foraminal stenosis on the sagittal images. IMPRESSION: 1. MR findings suspicious for early discitis and osteomyelitis at T11-12. Recommend correlation with clinical findings such as back pain, white count, fever, ex cetera. A repeat study with contrast may be helpful for further assessment. No obvious paraspinal or epidural abscess. 2. Central disc protrusions at T7-8 and T8-9. 3. Mild diffuse bulging discs at T10-11 and T11-12. 4. Limited examination of the lumbar spine disc spaces due to patient motion but no findings for discitis/osteomyelitis or significant spinal or foraminal stenosis in the lumbar spine. These results will be called to the ordering clinician or representative by the Radiologist Assistant, and communication documented in the PACS or [employer]. Electronically Signed   By: Marijo Sanes M.D.   On: 06/11/2022 11:21   MR THORACIC SPINE WO CONTRAST  Result Date: 06/11/2022 CLINICAL DATA:  Pain, fever and generalized weakness. EXAM: MRI THORACIC AND LUMBAR SPINE WITHOUT CONTRAST TECHNIQUE: Multiplanar and multiecho pulse sequences of the thoracic and lumbar spine were obtained without intravenous contrast. COMPARISON:  Lumbar spine radiographs from 2022 FINDINGS: MRI THORACIC SPINE FINDINGS Alignment:  Normal Vertebrae: Slight anterior wedging of the T11 vertebral  body. There is abnormal T1 and T2 signal intensity in both the T11 and T12 vertebral bodies and there appears to be a small amount of fluid in the disc space and the endplates are irregular. Findings suspicious for early discitis and osteomyelitis. Recommend correlation with clinical findings such as back pain, white count, fever, ex cetera. Cord:  Normal cord signal intensity.  No cord lesions or syrinx. Paraspinal and other soft tissues: No significant paraspinal findings. No epidural or paraspinal abscess. Disc levels: Central disc protrusions are noted at T7-8 and T8-9 with mild mass effect on the ventral thecal sac at both these levels. Mild diffuse bulging discs at T10-11 and T11-12. MRI LUMBAR SPINE FINDINGS Segmentation: There are five lumbar type vertebral bodies. The last full intervertebral disc space is labeled L5-S1. Alignment: Overall normal alignment. Mild degenerative anterolisthesis of L4. Vertebrae: Normal marrow signal. No bone lesions or fractures. Benign L1 hemangioma. No findings for discitis or osteomyelitis in the lumbar spine. Conus medullaris and cauda equina: Conus extends to the L1 level. Conus and cauda equina appear normal. Paraspinal and other soft tissues: No significant paraspinal or retroperitoneal findings but study is quite limited due to motion artifact. Disc levels: Very limited axial images due to significant patient motion. No significant disc protrusions, spinal or foraminal stenosis on the sagittal images. IMPRESSION: 1. MR findings suspicious for early discitis and osteomyelitis at T11-12. Recommend correlation with clinical findings such as back pain, white count, fever, ex cetera. A repeat study with contrast may be helpful for further assessment. No obvious paraspinal or epidural abscess. 2. Central disc protrusions at T7-8 and T8-9. 3. Mild diffuse bulging  discs at T10-11 and T11-12. 4. Limited examination of the lumbar spine disc spaces due to patient motion but no  findings for discitis/osteomyelitis or significant spinal or foraminal stenosis in the lumbar spine. These results will be called to the ordering clinician or representative by the Radiologist Assistant, and communication documented in the PACS or [employer]. Electronically Signed   By: Marijo Sanes M.D.   On: 06/11/2022 11:21   CT HEAD WO CONTRAST (5MM)  Result Date: 06/11/2022 CLINICAL DATA:  77 year old female with fever and encephalopathy. EXAM: CT HEAD WITHOUT CONTRAST TECHNIQUE: Contiguous axial images were obtained from the base of the skull through the vertex without intravenous contrast. RADIATION DOSE REDUCTION: This exam was performed according to the departmental dose-optimization program which includes automated exposure control, adjustment of the mA and/or kV according to patient size and/or use of iterative reconstruction technique. COMPARISON:  Brain MRI 08/23/2015.  Head CT 08/23/2015. FINDINGS: Brain: Cerebral volume is not significantly changed. 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 Also new is hypodensity in the left pons which is age indeterminate (same image). No superimposed midline shift, ventriculomegaly, mass effect, evidence of mass lesion, intracranial hemorrhage or evidence of cortically based acute infarction. Vascular: Calcified atherosclerosis at the skull base. No suspicious intracranial vascular hyperdensity. Skull: No acute osseous abnormality identified. Sinuses/Orbits: Chronic paranasal sinus inflammation not significantly changed from 2017. Chronic bubbly opacity in the left sphenoid. Tympanic cavities and mastoids remain well aerated. Other: Postoperative changes to both globes since 2017. No acute orbit or scalp soft tissue finding. IMPRESSION: 1. Chronically advanced small vessel disease, but  progressed since 2017 and age indeterminate in the left pons. Query right side weakness. 2. No other acute intracranial abnormality identified. 3. Chronic paranasal sinus disease. Electronically Signed   By: Genevie Ann M.D.   On: 06/11/2022 09:21   DG Chest 2 View  Result Date: 06/10/2022 CLINICAL DATA:  Weakness. EXAM: CHEST - 2 VIEW COMPARISON:  Chest x-ray dated April 01, 2016. FINDINGS: The heart size and mediastinal contours are within normal limits. Both lungs are clear. The visualized skeletal structures are unremarkable. IMPRESSION: No active cardiopulmonary disease. Electronically Signed   By: Titus Dubin M.D.   On: 06/10/2022 16:39   VAS [place] LOWER EXTREMITY VENOUS (DVT)  Result Date: 06/07/2022  Lower Venous DVT Study Patient Name:  LAILANY ENOCH  Date of Exam:   06/07/2022 Medical Rec #: [id number]         Accession #:    [id number] Date of Birth: August 12, 1945          Patient Gender: F Patient Age:   59 years Exam Location:  [facility] Procedure:      VAS [place] LOWER EXTREMITY VENOUS (DVT) Referring Phys: Lorenda Peck --------------------------------------------------------------------------------  Indications: Swelling. Other Indications: Right calf swelling for one week; denies SOB. Performing Technologist: Wilkie Aye RVT  Examination Guidelines: A complete evaluation includes B-mode imaging, spectral Doppler, color Doppler, and power Doppler as needed of all accessible portions of each vessel. Bilateral testing is considered an integral part of a complete examination. Limited examinations for reoccurring indications may be performed as noted. The reflux portion of the exam is performed with the patient in reverse Trendelenburg.  +---------+---------------+---------+-----------+----------+--------------+ RIGHT    CompressibilityPhasicitySpontaneityPropertiesThrombus Aging +---------+---------------+---------+-----------+----------+--------------+ CFV      Full           Yes  Yes                                  +---------+---------------+---------+-----------+----------+--------------+ SFJ      Full           Yes      Yes                                 +---------+---------------+---------+-----------+----------+--------------+ FV Prox  Full           Yes      Yes                                 +---------+---------------+---------+-----------+----------+--------------+ FV Mid   Full           Yes      Yes                                 +---------+---------------+---------+-----------+----------+--------------+ FV DistalFull           Yes      Yes                                 +---------+---------------+---------+-----------+----------+--------------+ PFV      Full           Yes      Yes                                 +---------+---------------+---------+-----------+----------+--------------+ POP      Full           Yes      Yes                                 +---------+---------------+---------+-----------+----------+--------------+ PTV      Full           Yes      Yes                                 +---------+---------------+---------+-----------+----------+--------------+ PERO     Full           Yes      Yes                                 +---------+---------------+---------+-----------+----------+--------------+ Gastroc  Full                                                        +---------+---------------+---------+-----------+----------+--------------+ GSV      Full           Yes      Yes                                 +---------+---------------+---------+-----------+----------+--------------+   +----+---------------+---------+-----------+----------+--------------+ LEFTCompressibilityPhasicitySpontaneityPropertiesThrombus  Aging +----+---------------+---------+-----------+----------+--------------+ CFV Full           Yes      Yes                                  +----+---------------+---------+-----------+----------+--------------+    Summary: RIGHT: - No evidence of deep vein thrombosis in the lower extremity. No indirect evidence of obstruction proximal to the inguinal ligament. - No cystic structure found in the popliteal fossa.  LEFT: - No evidence of common femoral vein obstruction.  *See table(s) above for measurements and observations. Electronically signed by Ida Rogue MD on 06/07/2022 at 9:33:01 PM.    Final     Microbiology: Results for orders placed or performed during the hospital encounter of 06/10/22  Resp panel by RT-PCR (RSV, Flu A&B, Covid) Anterior Nasal Swab     Status: None   Collection Time: 06/10/22  2:10 PM   Specimen: Anterior Nasal Swab  Result Value Ref Range Status   SARS Coronavirus 2 by RT PCR NEGATIVE NEGATIVE Final    Comment: (NOTE) SARS-CoV-2 target nucleic acids are NOT DETECTED.  The SARS-CoV-2 RNA is generally detectable in upper respiratory specimens during the acute phase of infection. The lowest concentration of SARS-CoV-2 viral copies this assay can detect is 138 copies/mL. A negative result does not preclude SARS-Cov-2 infection and should not be used as the sole basis for treatment or other patient management decisions. A negative result may occur with  improper specimen collection/handling, submission of specimen other than nasopharyngeal swab, presence of viral mutation(s) within the areas targeted by this assay, and inadequate number of viral copies(<138 copies/mL). A negative result must be combined with clinical observations, patient history, and epidemiological information. The expected result is Negative.  Fact Sheet for Patients:  EntrepreneurPulse.com.au  Fact Sheet for Healthcare Providers:  IncredibleEmployment.be  This test is no t yet approved or cleared by the [place] FDA and  has been authorized for detection and/or diagnosis of SARS-CoV-2  by FDA under an Emergency Use Authorization (EUA). This EUA will remain  in effect (meaning this test can be used) for the duration of the COVID-19 declaration under Section 564(b)(1) of the Act, 21 U.S.C.section 360bbb-3(b)(1), unless the authorization is terminated  or revoked sooner.       Influenza A by PCR NEGATIVE NEGATIVE Final   Influenza B by PCR NEGATIVE NEGATIVE Final    Comment: (NOTE) The Xpert Xpress SARS-CoV-2/FLU/RSV plus assay is intended as an aid in the diagnosis of influenza from Nasopharyngeal swab specimens and should not be used as a sole basis for treatment. Nasal washings and aspirates are unacceptable for Xpert Xpress SARS-CoV-2/FLU/RSV testing.  Fact Sheet for Patients: EntrepreneurPulse.com.au  Fact Sheet for Healthcare Providers: IncredibleEmployment.be  This test is not yet approved or cleared by the [place] FDA and has been authorized for detection and/or diagnosis of SARS-CoV-2 by FDA under an Emergency Use Authorization (EUA). This EUA will remain in effect (meaning this test can be used) for the duration of the COVID-19 declaration under Section 564(b)(1) of the Act, 21 U.S.C. section 360bbb-3(b)(1), unless the authorization is terminated or revoked.     Resp Syncytial Virus by PCR NEGATIVE NEGATIVE Final    Comment: (NOTE) Fact Sheet for Patients: EntrepreneurPulse.com.au  Fact Sheet for Healthcare Providers: IncredibleEmployment.be  This test is not yet approved or cleared by the [place] FDA and has been authorized for detection and/or diagnosis of  SARS-CoV-2 by FDA under an Emergency Use Authorization (EUA). This EUA will remain in effect (meaning this test can be used) for the duration of the COVID-19 declaration under Section 564(b)(1) of the Act, 21 U.S.C. section 360bbb-3(b)(1), unless the authorization is terminated or revoked.  Performed at  [facility], [address] [place], [place] [address]   Culture, blood (routine x 2)     Status: None (Preliminary result)   Collection Time: 06/10/22  6:26 PM   Specimen: BLOOD  Result Value Ref Range Status   Specimen Description BLOOD LEFT ANTECUBITAL  Final   Special Requests   Final    BOTTLES DRAWN AEROBIC AND ANAEROBIC Blood Culture adequate volume   Culture   Final    NO GROWTH 4 DAYS Performed at [facility], [address], [place], [place] [address]    Report Status PENDING  Incomplete  Group A Strep by PCR     Status: None   Collection Time: 06/10/22  6:55 PM   Specimen: Throat; Sterile Swab  Result Value Ref Range Status   Group A Strep by PCR NOT DETECTED NOT DETECTED Final    Comment: Performed at [facility], [address] [place], [place] [address]  Culture, blood (routine x 2)     Status: None (Preliminary result)   Collection Time: 06/10/22  7:05 PM   Specimen: BLOOD  Result Value Ref Range Status   Specimen Description BLOOD LEFT HAND  Final   Special Requests   Final    BOTTLES DRAWN AEROBIC AND ANAEROBIC Blood Culture results may not be optimal due to an excessive volume of blood received in culture bottles   Culture   Final    NO GROWTH 4 DAYS Performed at [facility], [address] [place], [place] [address]    Report Status PENDING  Incomplete  Respiratory (~20 pathogens) panel by PCR     Status: None   Collection Time: 06/10/22 11:36 PM   Specimen: Nasopharyngeal Swab; Respiratory  Result Value Ref Range Status   Adenovirus NOT DETECTED NOT DETECTED Final   Coronavirus 229E NOT DETECTED NOT DETECTED Final    Comment: (NOTE) The Coronavirus on the Respiratory Panel, DOES NOT test for the novel  Coronavirus (2019 nCoV)    Coronavirus HKU1 NOT DETECTED NOT DETECTED Final   Coronavirus NL63 NOT DETECTED NOT DETECTED Final   Coronavirus OC43 NOT DETECTED NOT DETECTED Final   Metapneumovirus NOT  DETECTED NOT DETECTED Final   Rhinovirus / Enterovirus NOT DETECTED NOT DETECTED Final   Influenza A NOT DETECTED NOT DETECTED Final   Influenza B NOT DETECTED NOT DETECTED Final   Parainfluenza Virus 1 NOT DETECTED NOT DETECTED Final   Parainfluenza Virus 2 NOT DETECTED NOT DETECTED Final   Parainfluenza Virus 3 NOT DETECTED NOT DETECTED Final   Parainfluenza Virus 4 NOT DETECTED NOT DETECTED Final   Respiratory Syncytial Virus NOT DETECTED NOT DETECTED Final   Bordetella pertussis NOT DETECTED NOT DETECTED Final   Bordetella Parapertussis NOT DETECTED NOT DETECTED Final   Chlamydophila pneumoniae NOT DETECTED NOT DETECTED Final   Mycoplasma pneumoniae NOT DETECTED NOT DETECTED Final    Comment: Performed at [facility], [place] [address] [place], [place] [address]  Urine Culture     Status: None   Collection Time: 06/11/22 12:02 PM   Specimen: Urine, Clean Catch  Result Value Ref Range Status   Specimen Description   Final    URINE, CLEAN CATCH Performed  at [facility], [address] [place], [place] [address]    Special Requests   Final    NONE Performed at [facility], [address], [place], [place] [id number]    Culture   Final    NO GROWTH Performed at [facility], [place] [address] [place], [place] [address]    Report Status 06/12/2022 FINAL  Final  Resp panel by RT-PCR (RSV, Flu A&B, Covid) Anterior Nasal Swab     Status: None   Collection Time: 06/11/22 12:02 PM   Specimen: Anterior Nasal Swab  Result Value Ref Range Status   SARS Coronavirus 2 by RT PCR NEGATIVE NEGATIVE Final    Comment: (NOTE) SARS-CoV-2 target nucleic acids are NOT DETECTED.  The SARS-CoV-2 RNA is generally detectable in upper respiratory specimens during the acute phase of infection. The lowest concentration of SARS-CoV-2 viral copies this assay can detect is 138 copies/mL. A negative result does not preclude SARS-Cov-2 infection and should not be used as the  sole basis for treatment or other patient management decisions. A negative result may occur with  improper specimen collection/handling, submission of specimen other than nasopharyngeal swab, presence of viral mutation(s) within the areas targeted by this assay, and inadequate number of viral copies(<138 copies/mL). A negative result must be combined with clinical observations, patient history, and epidemiological information. The expected result is Negative.  Fact Sheet for Patients:  EntrepreneurPulse.com.au  Fact Sheet for Healthcare Providers:  IncredibleEmployment.be  This test is no t yet approved or cleared by the [place] FDA and  has been authorized for detection and/or diagnosis of SARS-CoV-2 by FDA under an Emergency Use Authorization (EUA). This EUA will remain  in effect (meaning this test can be used) for the duration of the COVID-19 declaration under Section 564(b)(1) of the Act, 21 U.S.C.section 360bbb-3(b)(1), unless the authorization is terminated  or revoked sooner.       Influenza A by PCR NEGATIVE NEGATIVE Final   Influenza B by PCR NEGATIVE NEGATIVE Final    Comment: (NOTE) The Xpert Xpress SARS-CoV-2/FLU/RSV plus assay is intended as an aid in the diagnosis of influenza from Nasopharyngeal swab specimens and should not be used as a sole basis for treatment. Nasal washings and aspirates are unacceptable for Xpert Xpress SARS-CoV-2/FLU/RSV testing.  Fact Sheet for Patients: EntrepreneurPulse.com.au  Fact Sheet for Healthcare Providers: IncredibleEmployment.be  This test is not yet approved or cleared by the [place] FDA and has been authorized for detection and/or diagnosis of SARS-CoV-2 by FDA under an Emergency Use Authorization (EUA). This EUA will remain in effect (meaning this test can be used) for the duration of the COVID-19 declaration under Section 564(b)(1) of the  Act, 21 U.S.C. section 360bbb-3(b)(1), unless the authorization is terminated or revoked.     Resp Syncytial Virus by PCR NEGATIVE NEGATIVE Final    Comment: (NOTE) Fact Sheet for Patients: EntrepreneurPulse.com.au  Fact Sheet for Healthcare Providers: IncredibleEmployment.be  This test is not yet approved or cleared by the [place] FDA and has been authorized for detection and/or diagnosis of SARS-CoV-2 by FDA under an Emergency Use Authorization (EUA). This EUA will remain in effect (meaning this test can be used) for the duration of the COVID-19 declaration under Section 564(b)(1) of the Act, 21 U.S.C. section 360bbb-3(b)(1), unless the authorization is terminated or revoked.  Performed at [facility], [address], [place], [place] [address]   MRSA Next Gen by PCR, Nasal     Status:  None   Collection Time: 06/11/22 12:02 PM   Specimen: Nasal Mucosa; Nasal Swab  Result Value Ref Range Status   MRSA by PCR Next Gen NOT DETECTED NOT DETECTED Final    Comment: (NOTE) The GeneXpert MRSA Assay (FDA approved for NASAL specimens only), is one component of a comprehensive MRSA colonization surveillance program. It is not intended to diagnose MRSA infection nor to guide or monitor treatment for MRSA infections. Test performance is not FDA approved in patients less than 19 years old. Performed at [facility], [address] [place], [place] [address]   C Difficile Quick Screen w PCR reflex     Status: None   Collection Time: 06/12/22  7:24 PM   Specimen: STOOL  Result Value Ref Range Status   C Diff antigen NEGATIVE NEGATIVE Final   C Diff toxin NEGATIVE NEGATIVE Final   C Diff interpretation No C. difficile detected.  Final    Comment: Performed at [facility], [address] [place], [place] [address]    Labs: CBC: Recent Labs  Lab 06/10/22 1531 06/10/22 2336 06/11/22 0425 06/12/22 0437   WBC 6.0 5.1 4.9 3.0*  NEUTROABS 3.5 2.9  --   --   HGB 11.4* 10.4* 10.4* 11.5*  HCT 35.7* 33.0* 32.3* 35.6*  MCV 76.9* 78.4* 78.4* 78.2*  PLT 137* 121* 111* 332*   Basic Metabolic Panel: Recent Labs  Lab 06/10/22 1531 06/10/22 2016 06/11/22 0425 06/12/22 0437  NA 129*  --  132* 134*  K 3.6  --  3.5 3.3*  CL 98  --  104 103  CO2 20*  --  20* 23  GLUCOSE 150*  --  137* 114*  BUN 17  --  14 12  CREATININE 1.06*  --  0.98 0.94  CALCIUM 9.1  --  8.1* 8.4*  MG  --  1.4* 2.1  --   PHOS  --  2.5  --   --    Liver Function Tests: Recent Labs  Lab 06/10/22 1531  AST 42*  ALT 28  ALKPHOS 51  BILITOT 1.2  PROT 6.9  ALBUMIN 4.0   CBG: No results for input(s): "GLUCAP" in the last 168 hours.  Discharge time spent: greater than 30 minutes.  Signed: Fritzi Mandes, MD [facility] 06/14/2022

## 2022-06-14 NOTE — Care Management Important Message (Signed)
Important Message  Patient Details  Name: Michele [name] MRN: [id number] Date of Birth: 05-23-1946   Medicare Important Message Given:  Yes     Juliann Pulse A Glennice Marcos 06/14/2022, 11:13 AM

## 2022-06-14 NOTE — TOC Transition Note (Signed)
Transition of Care [facility]) - CM/SW Discharge Note   Patient Details  Name: Michele [name] MRN: [id number] Date of Birth: 1945/07/05  Transition of Care [facility]) CM/SW Contact:  Gerilyn Pilgrim, LCSW Phone Number: 06/14/2022, 10:54 AM   Clinical Narrative:   Pt originally wanting HH but is now declining HH. [place] notified. RW ordered through [employer] to be delivered to patient room rhonda notified. CSW signing off.     Final next level of care: Home/Self Care Barriers to Discharge: Barriers Resolved   Patient Goals and CMS Choice CMS Medicare.gov Compare Post Acute Care list provided to:: Patient Choice offered to / list presented to : Patient  Discharge Placement                         Discharge Plan and Services Additional resources added to the After Visit Summary for                  DME Arranged: Walker rolling DME Agency: [employer]                  Social Determinants of Health (SDOH) Interventions SDOH Screenings   Food Insecurity: No Food Insecurity (06/11/2022)  Housing: Low Risk  (06/11/2022)  Transportation Needs: No Transportation Needs (06/11/2022)  Utilities: Not At Risk (06/11/2022)  Depression (PHQ2-9): Low Risk  (07/05/2021)  Tobacco Use: Low Risk  (06/11/2022)     Readmission Risk Interventions     No data to display

## 2022-06-14 NOTE — Progress Notes (Signed)
ID Pt is doing well She is ambulating No fever or shaking feeling She has been off tylenol and no fever Back stable as baseline  O/e awake and alert Patient Vitals for the past 24 hrs:  BP Temp Temp src Pulse Resp SpO2  06/14/22 0808 (!) 141/62 98.7 F (37.1 C) -- (!) 59 16 95 %  06/14/22 0453 (!) 140/68 98.2 F (36.8 C) Oral 67 20 96 %  06/13/22 2003 (!) 186/66 98.6 F (37 C) -- (!) 54 19 96 %  06/13/22 1650 (!) 110/93 97.9 F (36.6 C) -- (!) 48 20 99 %   Chest CTA Hss1s2 CNS non focal Skin vitiligo  Labs    Latest Ref Rng & Units 06/12/2022    4:37 AM 06/11/2022    4:25 AM 06/10/2022   11:36 PM  CBC  WBC 4.0 - 10.5 K/uL 3.0  4.9  5.1   Hemoglobin 12.0 - 15.0 g/dL 11.5  10.4  10.4   Hematocrit 36.0 - 46.0 % 35.6  32.3  33.0   Platelets 150 - 400 K/uL 111  111  121         Latest Ref Rng & Units 06/12/2022    4:37 AM 06/11/2022    4:25 AM 06/10/2022    3:31 PM  CMP  Glucose 70 - 99 mg/dL 114  137  150   BUN 8 - 23 mg/dL '12  14  17   '$ Creatinine 0.44 - 1.00 mg/dL 0.94  0.98  1.06   Sodium 135 - 145 mmol/L 134  132  129   Potassium 3.5 - 5.1 mmol/L 3.3  3.5  3.6   Chloride 98 - 111 mmol/L 103  104  98   CO2 22 - 32 mmol/L '23  20  20   '$ Calcium 8.9 - 10.3 mg/dL 8.4  8.1  9.1   Total Protein 6.5 - 8.1 g/dL   6.9   Total Bilirubin 0.3 - 1.2 mg/dL   1.2   Alkaline Phos 38 - 126 U/L   51   AST 15 - 41 U/L   42   ALT 0 - 44 U/L   28      Impression/recommendation Fever viral VS serotonin syndrome - resolved MRI showed changes t11-t12 more in favor of DDD than osteo as discussed with [facility] radiologist Normal ESR/CRP Blood culture neg RVP/covid screen neg Hence no treatment Will follow in 1 month and repeat ESR/CRP and may be MRI  Pancytopenia- could be viral Recommend checking B12/folate if it persist  Discussed the management with patient and her husband She is being discharged today

## 2022-06-14 NOTE — Progress Notes (Signed)
PT Cancellation Note  Patient Details Name: KETTY BITTON MRN: [id number] DOB: 09/27/1945   Cancelled Treatment:    Reason Eval/Treat Not Completed: Other (comment). Session attempted. Per chart, has been mobile in hallway without AD. Pt able to easily walk around room without tremors, LOB, or AD. Pt reports she feels at baseline level and politely declines further therapy. No HHPT recommended at this time as she is at baseline level. Will dc in house.   Pope Brunty 06/14/2022, 10:05 AM Greggory Stallion, PT, DPT, GCS [phone]

## 2022-06-15 LAB — CULTURE, BLOOD (ROUTINE X 2)
Culture: NO GROWTH
Culture: NO GROWTH
Special Requests: ADEQUATE

## 2022-06-20 NOTE — Progress Notes (Addendum)
[facility] Follow-Up  Future Appointments  Date Time Provider Department  06/21/2022 10:00 AM [name], MD [facility]  07/05/2022 10:30 AM Alycia Rossetti, NP [facility]  07/16/2022 10:45 AM Tsosie Billing, MD [facility]  07/26/2022 10:30 AM Alycia Rossetti, NP [facility]  11/01/2022 10:30 AM [name], MD [facility]  01/27/2023 10:30 AM Ward Givens, NP [facility]  04/24/2023  2:00 PM [name], MD [facility]        [name] was admitted to the hospital on  06/10/2022   and patient was discharged from the hospital on 06/14/2022. The patient now presents for follow up for transition from recent hospitalization.  The day after discharge  our clinical staff contacted the patient to assure stability and schedule a follow up appointment. The discharge summary, medications and diagnostic test results were reviewed before meeting with the patient. The patient was admitted for:   Febrile illness Hyperlipidemia associated with type 2 diabetes mellitus (HCC) Gastroesophageal reflux disease, unspecified whether esophagitis present Essential hypertension Type 2 diabetes mellitus with stage 2 chronic kidney disease, without long-term current use of insulin (HCC) Obstructive sleep apnea on CPAP Anxiety Depression, major, recurrent, in partial remission (HCC) Weakness Hyponatremia Hyperlipidemia, mixed      The patient is a very nice 77 yo [name] with HTN, HLD, T2_NIDDM  and Vitamin D Deficiency who presented to the ER with generalized weakness, unstable gait, fever, Hyponatremia ( Na 129)  and was admitted to work-up for infection and all  cultures , diagnostic testing & imaging returned negative and empiric IV antibiotics that had been started were d/c'd as recommended by ID consultants. Patient was ultimately signed out as a "febrile Illness - probably viral . WBC was low & consistent with a viral infection.         Hospitalization discharge  instructions and medications are reconciled with the patient.      Patient is also followed with Hypertension, Hyperlipidemia, Pre-Diabetes and Vitamin D Deficiency.      Patient is treated for HTN & BP has been controlled at home. Today's BP: 130/62. Patient has had no complaints of any cardiac type chest pain, palpitations, dyspnea/orthopnea/PND, dizziness, claudication, or dependent edema.     Hyperlipidemia is controlled with diet & meds. Patient denies myalgias or other med SE's. Last Lipids were  near5 goal :  Lab Results  Component Value Date   CHOL 136 04/18/2022   HDL 38 (L) 04/18/2022   LDLCALC 71 04/18/2022   TRIG 199 (H) 04/18/2022   CHOLHDL 3.6 04/18/2022      Also, the patient has history of T2_NIDDM PreDiabetes and has had no symptoms of reactive hypoglycemia, diabetic polys, paresthesias or visual blurring.  Last A1c was   not at goal :  Lab Results  Component Value Date   HGBA1C 6.4 (H) 04/18/2022      Further, the patient also has history of Vitamin D Deficiency and supplements vitamin D without any suspected side-effects. Last vitamin D was   at goal :  Lab Results  Component Value Date   VD25OH 57 04/18/2022   Current Outpatient Medications on File Prior to Visit  Medication Sig   acetaminophen (TYLENOL) 500 MG tablet Take 500 mg by mouth 3 (three) times daily as needed for moderate pain.   aspirin 325 MG tablet Take 325 mg by mouth daily.   atenolol (TENORMIN) 50 MG tablet  Take 100 mg by mouth daily   Cholecalciferol (VITAMIN D PO) Take 10,000  Units by mouth. 2 times a week no W and F   cloNIDine (CATAPRES) 0.2 MG tablet Take      1 tablet       3 x /day        for BP. Take if BP > 160 (Patient taking differently: Take 0.2 mg by mouth 3 (three) times daily. Take      1 tablet       3 x /day        for BP. Take if BP > 160)   Cyanocobalamin (VITAMIN B-12 PO) Take 1 tablet by mouth daily. 5000 mc one time in the AM PRN   glucose blood test strip Test  Blood  Sugar  Daily  (Dx:  e11.29  ))   iron polysaccharides (NIFEREX) 150 MG capsule Take 1 capsule (150 mg total) by mouth daily.   Lancets MISC Test blood sugar once daily   meloxicam (MOBIC) 15 MG tablet Take 1 tablet (15 mg total) by mouth daily as needed for pain.   metFORMIN (GLUCOPHAGE-XR) 500 MG 24 hr tablet Take  2 tablets   2 x /day   with Meals  for Diabetes (Patient taking differently: Take 1,000 mg by mouth 2 (two) times daily with a meal. Take  2 tablets   2 x /day   with Meals  for Diabetes)   minoxidil (LONITEN) 10 MG tablet Take  1 tablet  Daily for BP                                       /                                                TAKE                       BY                          MOUTH (Patient taking differently: Take 10 mg by mouth daily. Take  1 tablet  Daily for BP                                       /                                                TAKE                       BY                          MOUTH)   olmesartan (BENICAR) 40 MG tablet Take 1 tablet every Evening for BP (Patient taking differently: Take 40 mg by mouth daily. Take 1 tablet every Evening for BP)   oxybutynin (DITROPAN-XL) 10 MG 24 hr tablet Take  1 tablet  Daily for Bladder Control                                              /  TAKE                       BY                       MOUTH (Patient taking differently: Take 10 mg by mouth at bedtime. Take  1 tablet  Daily for Bladder Control                                              /                          TAKE                       BY                       MOUTH)   rosuvastatin (CRESTOR) 20 MG tablet Take  1 tablet  Daily for Cholesterol                                                 /                               TAKE                   BY              MOUTH     (?) ONCE DAILY (Patient taking differently: Take 20 mg by mouth daily. Take  1 tablet  Daily for Cholesterol                                                 /                                TAKE                   BY              MOUTH     (?) ONCE DAILY)   sertraline (ZOLOFT) 100 MG tablet Take 1 tablet (100 mg total) by mouth daily.   terbinafine (LAMISIL) 250 MG tablet Take   1 tablet  Daily  for Toenail Fungus (Patient taking differently: Take 250 mg by mouth daily. Take   1 tablet  Daily  for Toenail Fungus)   verapamil (CALAN-SR) 240 MG CR tablet Take  1 tablet  Daily  with Food for BP & Heart Rhythm (Patient taking differently: Take 240 mg by mouth daily. Take  1 tablet  Daily  with Food for BP & Heart Rhythm)   No current facility-administered medications on file prior to visit.   Allergies  Allergen Reactions   Lipitor [Atorvastatin]     Elevates LFT's   PMHx:   Past Medical History:  Diagnosis Date   Anemia  Anxiety    Arthritis    Carpal tunnel syndrome    Cerebrovascular disease 01/02/2017   Duodenal diverticulum    Elevated uric acid in blood    Esophageal motility disorder    Fatty liver    GERD (gastroesophageal reflux disease)    Hyperlipidemia    Hypertension    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   Sleep apnea    Tubulovillous adenoma of colon    Type II or unspecified type diabetes mellitus without mention of complication, not stated as uncontrolled    Immunization History  Administered Date(s) Administered   Influenza Split 03/25/2013   Influenza, High Dose Seasonal PF 03/15/2014, 03/07/2015, 03/27/2016, 04/21/2017, 02/18/2018, 03/11/2019, 03/20/2020, 03/30/2021, 04/18/2022   PFIZER(Purple Top)SARS-COV-2 Vaccination 06/24/2019, 07/15/2019, 03/08/2020   Pneumococcal Conjugate-13 01/26/2014   Pneumococcal Polysaccharide-23 03/14/2011   Td 03/25/2013   Zoster, Live 06/03/2005   Past Surgical History:  Procedure Laterality Date   APPENDECTOMY     BREAST BIOPSY Right    CARPAL TUNNEL RELEASE Right 1992   cataract extration     COLONOSCOPY     CYST REMOVAL HAND     TONSILLECTOMY  1952   FHx:    Reviewed / unchanged  SHx:     Reviewed / unchanged  Systems Review:  Constitutional: Denies fever, chills, wt changes, headaches, insomnia, fatigue, night sweats, change in appetite. Eyes: Denies redness, blurred vision, diplopia, discharge, itchy, watery eyes.  ENT: Denies discharge, congestion, post nasal drip, epistaxis, sore throat, earache, hearing loss, dental pain, tinnitus, vertigo, sinus pain, snoring.  CV: Denies chest pain, palpitations, irregular heartbeat, syncope, dyspnea, diaphoresis, orthopnea, PND, claudication or edema. Respiratory: denies cough, dyspnea, DOE, pleurisy, hoarseness, laryngitis, wheezing.  Gastrointestinal: Denies dysphagia, odynophagia, heartburn, reflux, water brash, abdominal pain or cramps, nausea, vomiting, bloating, diarrhea, constipation, hematemesis, melena, hematochezia  or hemorrhoids. Genitourinary: Denies dysuria, frequency, urgency, nocturia, hesitancy, discharge, hematuria or flank pain. Musculoskeletal: Denies arthralgias, myalgias, stiffness, jt. swelling, pain, limping or strain/sprain.  Skin: Denies pruritus, rash, hives, warts, acne, eczema or change in skin lesion(s). Neuro: No weakness, tremor, incoordination, spasms, paresthesia or pain. Psychiatric: Denies confusion, memory loss or sensory loss. Endo: Denies change in weight, skin or hair change.  Heme/Lymph: No excessive bleeding, bruising or enlarged lymph nodes.  Physical Exam  BP 130/62   Pulse 66   Temp 97.9 F (36.6 C)   Resp 16   Ht '5\' 3"'$  (1.6 m)   Wt 153 lb 3.2 oz (69.5 kg)   SpO2 99%   BMI 27.14 kg/m   Appears well nourished, well groomed  and in no distress.  Eyes: PERRLA, EOMs, conjunctiva no swelling or erythema. Sinuses: No frontal/maxillary tenderness ENT/Mouth: EAC's clear, TM's nl w/o erythema, bulging. Nares clear w/o erythema, swelling, exudates. Oropharynx clear without erythema or exudates. Oral hygiene is good. Tongue normal, non obstructing. Hearing intact.  Neck: Supple. Thyroid nl.  Car 2+/2+ without bruits, nodes or JVD. Chest: Respirations nl with BS clear & equal w/o rales, rhonchi, wheezing or stridor.  Cor: Heart sounds normal w/ regular rate and rhythm without sig. murmurs, gallops, clicks or rubs. Peripheral pulses normal and equal  without edema.  Abdomen: Soft & bowel sounds normal. Non-tender w/o guarding, rebound, hernias, masses or organomegaly.  Lymphatics: Unremarkable.  Musculoskeletal: Full ROM all peripheral extremities, joint stability, 5/5 strength and normal gait.  Skin: Warm, dry without exposed rashes, lesions or ecchymosis apparent.  Neuro: Cranial nerves intact, reflexes equal bilaterally. Sensory-motor testing grossly intact. Tendon reflexes grossly intact.  Pysch:  Alert & oriented x 3.  Insight and judgement nl & appropriate. No ideations.  Assessment and Plan:  1. Febrile illness  - CBC with Differential/Platelet  2. Hyperlipidemia associated with type 2 diabetes mellitus ([place])  - Continue diet/meds, exercise,& lifestyle modifications.  - Continue monitor periodic cholesterol/liver & renal functions     3. Gastroesophageal reflux disease  - CBC with Differential/Platelet  4. Essential hypertension  - Continue medication, monitor blood pressure at home.  - Continue DASH diet.  Reminder to go to the ER if any CP,  SOB, nausea, dizziness, severe HA, changes vision/speech.   - CBC with Differential/Platelet - COMPLETE METABOLIC PANEL WITH GFR  5. Type 2 diabetes mellitus with stage 2 chronic kidney  disease, without long-term current use of insulin (HCC)  - Fructosamine  6. Obstructive sleep apnea on CPAP   7. Anxiety  - Continue supplementation.    8. Depression, major, recurrent, in partial remission ([place])   9. Weakness  - CBC with Differential/Platelet  10. Hyponatremia  - COMPLETE METABOLIC PANEL WITH GFR  11. Hyperlipidemia, mixed   12. Medication management  - CBC with Differential/Platelet - COMPLETE  METABOLIC PANEL WITH GFR - Fructosamine    - Continue diet, exercise, lifestyle modifications.  - Monitor appropriate labs.       Discussed  regular exercise, BP monitoring, weight control to achieve/maintain BMI less than 25 and discussed meds and SE's. Recommended labs to assess and monitor clinical status with further disposition pending results of labs. Over 30 minutes of exam, counseling, chart review was performed.   Kirtland Bouchard, MD

## 2022-06-21 ENCOUNTER — Encounter: Payer: Self-pay | Admitting: Internal Medicine

## 2022-06-21 ENCOUNTER — Ambulatory Visit (INDEPENDENT_AMBULATORY_CARE_PROVIDER_SITE_OTHER): Payer: PPO | Admitting: Internal Medicine

## 2022-06-21 VITALS — BP 130/62 | HR 66 | Temp 97.9°F | Resp 16 | Ht 63.0 in | Wt 153.2 lb

## 2022-06-21 DIAGNOSIS — E782 Mixed hyperlipidemia: Secondary | ICD-10-CM | POA: Diagnosis not present

## 2022-06-21 DIAGNOSIS — E785 Hyperlipidemia, unspecified: Secondary | ICD-10-CM

## 2022-06-21 DIAGNOSIS — F419 Anxiety disorder, unspecified: Secondary | ICD-10-CM

## 2022-06-21 DIAGNOSIS — N182 Chronic kidney disease, stage 2 (mild): Secondary | ICD-10-CM | POA: Diagnosis not present

## 2022-06-21 DIAGNOSIS — R509 Fever, unspecified: Secondary | ICD-10-CM

## 2022-06-21 DIAGNOSIS — I10 Essential (primary) hypertension: Secondary | ICD-10-CM

## 2022-06-21 DIAGNOSIS — E871 Hypo-osmolality and hyponatremia: Secondary | ICD-10-CM | POA: Diagnosis not present

## 2022-06-21 DIAGNOSIS — K219 Gastro-esophageal reflux disease without esophagitis: Secondary | ICD-10-CM | POA: Diagnosis not present

## 2022-06-21 DIAGNOSIS — R531 Weakness: Secondary | ICD-10-CM

## 2022-06-21 DIAGNOSIS — E1122 Type 2 diabetes mellitus with diabetic chronic kidney disease: Secondary | ICD-10-CM | POA: Diagnosis not present

## 2022-06-21 DIAGNOSIS — F3341 Major depressive disorder, recurrent, in partial remission: Secondary | ICD-10-CM | POA: Diagnosis not present

## 2022-06-21 DIAGNOSIS — Z79899 Other long term (current) drug therapy: Secondary | ICD-10-CM | POA: Diagnosis not present

## 2022-06-21 DIAGNOSIS — G4733 Obstructive sleep apnea (adult) (pediatric): Secondary | ICD-10-CM | POA: Diagnosis not present

## 2022-06-21 DIAGNOSIS — E1169 Type 2 diabetes mellitus with other specified complication: Secondary | ICD-10-CM | POA: Diagnosis not present

## 2022-06-21 NOTE — Patient Instructions (Signed)

## 2022-06-23 NOTE — Progress Notes (Signed)
<><><><><><><><><><><><><><><><><><><><><><><><><><><><><><><><><> <><><><><><><><><><><><><><><><><><><><><><><><><><><><><><><><><> - Test results slightly outside the reference range are not unusual. If there is anything important, I will review this with you,  otherwise it is considered normal test values.  If you have further questions,  please do not hesitate to contact me at the office or via My Chart.  <><><><><><><><><><><><><><><><><><><><><><><><><><><><><><><><><> <><><><><><><><><><><><><><><><><><><><><><><><><><><><><><><><><>  -  CBC still shows borderline mild anemia that  looks iron deficiency,   So . . . . .  - Eat More Veggies with Iron as   Carrots, Beets , all leafy green veggies as Spinach, Collards,  Turnip - Mustard or   Mixed Greens, Kale, Asparagus, Broccoli, Brussel Sprouts, Green Beans / peas,   Soybeans, Lentils, Sweet Potatoes  <><><><><><><><><><><><><><><><><><><><><><><><><><><><><><><><><> <><><><><><><><><><><><><><><><><><><><><><><><><><><><><><><><><>  - Liver enzymes are still slightly elevated which most likely is due to                                                                                          Fatty Liver from excess weight   Fatty liver or Nonalcoholic fatty liver disease (NASH) is now   the leading cause of liver failure in the [place].   It is normally from such risk factors as obesity, diabetes, insulin resistance,   high cholesterol, or metabolic syndrome.   The only definitive therapy is weight loss and exercise.                                                                     Suggest walking 20-30 mins daily.   Decreasing carbohydrates, increasing veggies.   Vitamin E 800 IU a day may be beneficial.   z Liver cancer has been noted in patient with fatty liver without cirrhosis.   Fatty Liver   Fatty liver is the accumulation of fat in liver cells.   It is also called hepatosteatosis or  steatohepatitis.   It is normal for your liver to contain some fat.   If fat is more than 5 to 10% of your liver's weight, you have fatty liver.   There are often no symptoms (problems) for years while damage is still occurring.  People often learn about their fatty liver when they have medical tests for other reasons.   Fat can damage your liver for years or even decades without causing problems.   When it becomes severe, it can cause fatigue, weight loss, weakness, and confusion.   This makes you more likely to develop more serious liver problems.  The liver is the largest organ in the body.   It does a lot of work and often gives no warning signs  when it is sick until late in a disease.   The liver has many important jobs including:   Breaking down foods.   Storing vitamins, iron, and other minerals.   Making proteins.   Making bile for food digestion.   Breaking down many products including medications, alcohol and some poisons.   PROGNOSIS   Fatty liver may cause no damage or it can lead to an inflammation of the liver.   This is, called steatohepatitis.  <><><><><><><><><><><><><><><><><><><><><><><><><><><><><><><><><>  - So Work on losing weight  <><><><><><><><><><><><><><><><><><><><><><><><><><><><><><><><><> <><><><><><><><><><><><><><><><><><><><><><><><><><><><><><><><><>                                                                                                         Over time the liver may become scarred and hardened. This condition is called cirrhosis. Cirrhosis is serious and may lead to liver failure or cancer. NASH is one of the leading causes of cirrhosis. About 10-20% of Americans have fatty liver and a smaller 2-5% has NASH.   TREATMENT  Weight loss, fat  restriction, and exercise in overweight patients produces inconsistent results but is worth trying.  Good control of diabetes may reduce fatty liver.  Eat a balanced, healthy diet.  Increase your physical activity.  There are no medical or surgical treatments for a fatty liver or NASH, but improving your diet and increasing your exercise may help prevent or reverse some of the damage.

## 2022-06-25 LAB — CBC WITH DIFFERENTIAL/PLATELET
Absolute Monocytes: 418 cells/uL (ref 200–950)
Basophils Absolute: 19 cells/uL (ref 0–200)
Basophils Relative: 0.4 %
Eosinophils Absolute: 139 cells/uL (ref 15–500)
Eosinophils Relative: 2.9 %
HCT: 36.3 % (ref 35.0–45.0)
Hemoglobin: 11.8 g/dL (ref 11.7–15.5)
Lymphs Abs: 1570 cells/uL (ref 850–3900)
MCH: 25.5 pg — ABNORMAL LOW (ref 27.0–33.0)
MCHC: 32.5 g/dL (ref 32.0–36.0)
MCV: 78.6 fL — ABNORMAL LOW (ref 80.0–100.0)
MPV: 8.8 fL (ref 7.5–12.5)
Monocytes Relative: 8.7 %
Neutro Abs: 2654 cells/uL (ref 1500–7800)
Neutrophils Relative %: 55.3 %
Platelets: 228 10*3/uL (ref 140–400)
RBC: 4.62 10*6/uL (ref 3.80–5.10)
RDW: 15.2 % — ABNORMAL HIGH (ref 11.0–15.0)
Total Lymphocyte: 32.7 %
WBC: 4.8 10*3/uL (ref 3.8–10.8)

## 2022-06-25 LAB — COMPLETE METABOLIC PANEL WITH GFR
AG Ratio: 1.8 (calc) (ref 1.0–2.5)
ALT: 42 U/L — ABNORMAL HIGH (ref 6–29)
AST: 36 U/L — ABNORMAL HIGH (ref 10–35)
Albumin: 4.2 g/dL (ref 3.6–5.1)
Alkaline phosphatase (APISO): 63 U/L (ref 37–153)
BUN: 9 mg/dL (ref 7–25)
CO2: 30 mmol/L (ref 20–32)
Calcium: 9.5 mg/dL (ref 8.6–10.4)
Chloride: 104 mmol/L (ref 98–110)
Creat: 0.78 mg/dL (ref 0.60–1.00)
Globulin: 2.4 g/dL (calc) (ref 1.9–3.7)
Glucose, Bld: 130 mg/dL — ABNORMAL HIGH (ref 65–99)
Potassium: 4.5 mmol/L (ref 3.5–5.3)
Sodium: 143 mmol/L (ref 135–146)
Total Bilirubin: 0.4 mg/dL (ref 0.2–1.2)
Total Protein: 6.6 g/dL (ref 6.1–8.1)
eGFR: 79 mL/min/{1.73_m2} (ref >=60)

## 2022-06-25 LAB — FRUCTOSAMINE: Fructosamine: 256 umol/L (ref 205–285)

## 2022-07-05 ENCOUNTER — Ambulatory Visit: Payer: PPO | Admitting: Nurse Practitioner

## 2022-07-16 ENCOUNTER — Inpatient Hospital Stay: Payer: PPO | Admitting: Infectious Diseases

## 2022-07-18 ENCOUNTER — Other Ambulatory Visit: Payer: Self-pay | Admitting: Nurse Practitioner

## 2022-07-18 DIAGNOSIS — I10 Essential (primary) hypertension: Secondary | ICD-10-CM

## 2022-07-26 ENCOUNTER — Other Ambulatory Visit: Payer: Self-pay | Admitting: Internal Medicine

## 2022-07-26 ENCOUNTER — Ambulatory Visit: Payer: PPO | Admitting: Nurse Practitioner

## 2022-07-26 DIAGNOSIS — B351 Tinea unguium: Secondary | ICD-10-CM

## 2022-07-26 MED ORDER — TERBINAFINE HCL 250 MG PO TABS: ORAL_TABLET | ORAL | 0 refills | Status: DC

## 2022-08-02 DIAGNOSIS — G4733 Obstructive sleep apnea (adult) (pediatric): Secondary | ICD-10-CM | POA: Diagnosis not present

## 2022-09-24 ENCOUNTER — Other Ambulatory Visit: Payer: Self-pay

## 2022-09-24 ENCOUNTER — Other Ambulatory Visit: Payer: Self-pay | Admitting: Nurse Practitioner

## 2022-09-24 ENCOUNTER — Telehealth: Payer: Self-pay | Admitting: Nurse Practitioner

## 2022-09-24 DIAGNOSIS — E1122 Type 2 diabetes mellitus with diabetic chronic kidney disease: Secondary | ICD-10-CM

## 2022-09-24 MED ORDER — GLUCOSE BLOOD VI STRP: ORAL_STRIP | 3 refills | Status: DC

## 2022-09-24 NOTE — Telephone Encounter (Signed)
REQUESTING A REFILL ON ONE TOUCH VERO TEST STRIPS

## 2022-10-02 ENCOUNTER — Other Ambulatory Visit: Payer: Self-pay | Admitting: Nurse Practitioner

## 2022-10-02 DIAGNOSIS — I10 Essential (primary) hypertension: Secondary | ICD-10-CM

## 2022-10-03 ENCOUNTER — Ambulatory Visit: Payer: PPO | Admitting: Nurse Practitioner
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
## 2022-11-01 ENCOUNTER — Ambulatory Visit: Payer: PPO | Admitting: Internal Medicine

## 2022-11-01 ENCOUNTER — Ambulatory Visit (INDEPENDENT_AMBULATORY_CARE_PROVIDER_SITE_OTHER): Payer: PPO | Admitting: Nurse Practitioner

## 2022-11-01 ENCOUNTER — Encounter: Payer: Self-pay | Admitting: Nurse Practitioner

## 2022-11-01 VITALS — BP 136/70 | HR 92 | Temp 97.9°F | Resp 16 | Ht 63.0 in | Wt 147.2 lb

## 2022-11-01 DIAGNOSIS — E1122 Type 2 diabetes mellitus with diabetic chronic kidney disease: Secondary | ICD-10-CM | POA: Diagnosis not present

## 2022-11-01 DIAGNOSIS — Z79899 Other long term (current) drug therapy: Secondary | ICD-10-CM | POA: Diagnosis not present

## 2022-11-01 DIAGNOSIS — L80 Vitiligo: Secondary | ICD-10-CM

## 2022-11-01 DIAGNOSIS — I679 Cerebrovascular disease, unspecified: Secondary | ICD-10-CM

## 2022-11-01 DIAGNOSIS — I10 Essential (primary) hypertension: Secondary | ICD-10-CM

## 2022-11-01 DIAGNOSIS — F3341 Major depressive disorder, recurrent, in partial remission: Secondary | ICD-10-CM

## 2022-11-01 DIAGNOSIS — K760 Fatty (change of) liver, not elsewhere classified: Secondary | ICD-10-CM

## 2022-11-01 DIAGNOSIS — R6889 Other general symptoms and signs: Secondary | ICD-10-CM

## 2022-11-01 DIAGNOSIS — N182 Chronic kidney disease, stage 2 (mild): Secondary | ICD-10-CM | POA: Diagnosis not present

## 2022-11-01 DIAGNOSIS — R7309 Other abnormal glucose: Secondary | ICD-10-CM | POA: Diagnosis not present

## 2022-11-01 DIAGNOSIS — Z0001 Encounter for general adult medical examination with abnormal findings: Secondary | ICD-10-CM

## 2022-11-01 DIAGNOSIS — E782 Mixed hyperlipidemia: Secondary | ICD-10-CM

## 2022-11-01 DIAGNOSIS — K219 Gastro-esophageal reflux disease without esophagitis: Secondary | ICD-10-CM

## 2022-11-01 DIAGNOSIS — E1169 Type 2 diabetes mellitus with other specified complication: Secondary | ICD-10-CM

## 2022-11-01 DIAGNOSIS — Z8639 Personal history of other endocrine, nutritional and metabolic disease: Secondary | ICD-10-CM

## 2022-11-01 DIAGNOSIS — Z86010 Personal history of colonic polyps: Secondary | ICD-10-CM

## 2022-11-01 DIAGNOSIS — E785 Hyperlipidemia, unspecified: Secondary | ICD-10-CM | POA: Diagnosis not present

## 2022-11-01 DIAGNOSIS — G4733 Obstructive sleep apnea (adult) (pediatric): Secondary | ICD-10-CM

## 2022-11-01 DIAGNOSIS — D649 Anemia, unspecified: Secondary | ICD-10-CM | POA: Diagnosis not present

## 2022-11-01 DIAGNOSIS — B351 Tinea unguium: Secondary | ICD-10-CM

## 2022-11-01 DIAGNOSIS — E663 Overweight: Secondary | ICD-10-CM

## 2022-11-01 DIAGNOSIS — E559 Vitamin D deficiency, unspecified: Secondary | ICD-10-CM

## 2022-11-01 MED ORDER — ROSUVASTATIN CALCIUM 20 MG PO TABS: ORAL_TABLET | ORAL | 3 refills | Status: DC

## 2022-11-01 MED ORDER — ITRACONAZOLE 100 MG PO CAPS: ORAL_CAPSULE | ORAL | 2 refills | Status: DC

## 2022-11-01 NOTE — Patient Instructions (Signed)

## 2022-11-02 LAB — COMPLETE METABOLIC PANEL WITH GFR
AG Ratio: 1.9 (calc) (ref 1.0–2.5)
ALT: 40 U/L — ABNORMAL HIGH (ref 6–29)
AST: 51 U/L — ABNORMAL HIGH (ref 10–35)
Albumin: 4.9 g/dL (ref 3.6–5.1)
Alkaline phosphatase (APISO): 82 U/L (ref 37–153)
BUN/Creatinine Ratio: 15 (calc) (ref 6–22)
BUN: 15 mg/dL (ref 7–25)
CO2: 27 mmol/L (ref 20–32)
Calcium: 10.3 mg/dL (ref 8.6–10.4)
Chloride: 104 mmol/L (ref 98–110)
Creat: 1.02 mg/dL — ABNORMAL HIGH (ref 0.60–1.00)
Globulin: 2.6 g/dL (calc) (ref 1.9–3.7)
Glucose, Bld: 97 mg/dL (ref 65–99)
Potassium: 4.8 mmol/L (ref 3.5–5.3)
Sodium: 141 mmol/L (ref 135–146)
Total Bilirubin: 1 mg/dL (ref 0.2–1.2)
Total Protein: 7.5 g/dL (ref 6.1–8.1)
eGFR: 57 mL/min/{1.73_m2} — ABNORMAL LOW (ref >=60)

## 2022-11-02 LAB — LIPID PANEL
Cholesterol: 161 mg/dL (ref <200)
HDL: 49 mg/dL — ABNORMAL LOW (ref >=50)
LDL Cholesterol (Calc): 80 mg/dL (calc)
Non-HDL Cholesterol (Calc): 112 mg/dL (calc) (ref <130)
Total CHOL/HDL Ratio: 3.3 (calc) (ref <5.0)
Triglycerides: 228 mg/dL — ABNORMAL HIGH (ref <150)

## 2022-11-02 LAB — HEMOGLOBIN A1C
Hgb A1c MFr Bld: 6.2 % of total Hgb — ABNORMAL HIGH (ref <5.7)
Mean Plasma Glucose: 131 mg/dL
eAG (mmol/L): 7.3 mmol/L

## 2022-11-02 LAB — FERRITIN: Ferritin: 79 ng/mL (ref 16–288)

## 2022-11-02 LAB — CBC WITH DIFFERENTIAL/PLATELET
Absolute Monocytes: 572 cells/uL (ref 200–950)
Basophils Absolute: 33 cells/uL (ref 0–200)
Basophils Relative: 0.5 %
Eosinophils Absolute: 182 cells/uL (ref 15–500)
Eosinophils Relative: 2.8 %
HCT: 39 % (ref 35.0–45.0)
Hemoglobin: 12.4 g/dL (ref 11.7–15.5)
Lymphs Abs: 2308 cells/uL (ref 850–3900)
MCH: 25.3 pg — ABNORMAL LOW (ref 27.0–33.0)
MCHC: 31.8 g/dL — ABNORMAL LOW (ref 32.0–36.0)
MCV: 79.4 fL — ABNORMAL LOW (ref 80.0–100.0)
MPV: 9.2 fL (ref 7.5–12.5)
Monocytes Relative: 8.8 %
Neutro Abs: 3406 cells/uL (ref 1500–7800)
Neutrophils Relative %: 52.4 %
Platelets: 195 10*3/uL (ref 140–400)
RBC: 4.91 10*6/uL (ref 3.80–5.10)
RDW: 15.2 % — ABNORMAL HIGH (ref 11.0–15.0)
Total Lymphocyte: 35.5 %
WBC: 6.5 10*3/uL (ref 3.8–10.8)

## 2022-11-02 LAB — IRON, TOTAL/TOTAL IRON BINDING CAP
%SAT: 10 % (calc) — ABNORMAL LOW (ref 16–45)
Iron: 52 ug/dL (ref 45–160)
TIBC: 516 mcg/dL (calc) — ABNORMAL HIGH (ref 250–450)

## 2022-11-02 LAB — TSH: TSH: 1.3 mIU/L (ref 0.40–4.50)

## 2022-11-02 LAB — MAGNESIUM: Magnesium: 2 mg/dL (ref 1.5–2.5)

## 2022-11-04 ENCOUNTER — Telehealth: Payer: Self-pay

## 2022-11-04 NOTE — Telephone Encounter (Signed)
Prior auth completed and submitted. 

## 2022-11-05 NOTE — Progress Notes (Signed)
Patient is aware of lab results and instructions. -e welch

## 2022-11-06 NOTE — Telephone Encounter (Signed)
Prior auth denied. 

## 2022-12-12 ENCOUNTER — Encounter: Payer: Self-pay | Admitting: Internal Medicine

## 2022-12-12 DIAGNOSIS — G4733 Obstructive sleep apnea (adult) (pediatric): Secondary | ICD-10-CM | POA: Diagnosis not present

## 2023-01-23 NOTE — Progress Notes (Unsigned)
PATIENT: Michele [name] DOB: 12-23-1945  REASON FOR VISIT: follow up HISTORY FROM: patient Primary neurologist: Dr. Vickey Huger  No chief complaint on file.   HISTORY OF PRESENT ILLNESS: Today 01/26/23:   Michele [name] is a 77 y.o. female with a history of OSA on CPAP. Returns today for follow-up.       12/18/21:Michele [name] is a 77 year old female with a history of obstructive sleep apnea on CPAP.  Reports she is still having fatigue but does not fill its related to OSA but has busy she is in the day.  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  She does a lot of cleaning as well.  Her download is below.  She denies any new issues   12/12/20: Michele [name] is a 77 year old female with a history of obstructive sleep apnea on CPAP.  She returns today for follow-up.  She denies any new issues with the CPAP.  She reports that she still has some daytime fatigue but reports that she is very active throughout the day.  They mow 7 yards and they have a garden that they maintain.  Patient also does a lot of canning vegetables.  She returns today for an evaluation.    12/13/19: Michele [name] is a 77 year old female with a history of obstructive sleep apnea on CPAP.  Her download indicates that she used her machine 29 out of 30 days for compliance of 97%.  She used her machine greater than 4 hours each night.  On average she uses her machine 5 hours and 44 minutes.  Her residual AHI is 1.7 on 5 to 10 cm of water with EPR 3.  Leak in the 95th percentile is 24.1 L/min.  HISTORY 12/10/18 :   Michele [name] is a 77 year old female with a history of obstructive sleep apnea on CPAP.  Her download indicates that she use her machine nightly for compliance of 100%.  She used her machine greater than 4 hours 29 days for compliance of 97%.  On average she uses her machine 6 hours and 8 minutes.  Her residual AHI is 2.5 on 5-10 cmH2O with EPR of 3.  Her leak in the 95th percentile is 27.6  L/min.  She reports that some mornings she will wake up with a headache however she feels that this is irritation from wearing the mask and straps.  She reports that she is not interested in changing the style of mask at this time.  REVIEW OF SYSTEMS: Out of a complete 14 system review of symptoms, the patient complains only of the following symptoms, and all other reviewed systems are negative.   ESS 9  ALLERGIES: Allergies  Allergen Reactions   Lipitor [Atorvastatin]     Elevates LFT's    HOME MEDICATIONS: Outpatient Medications Prior to Visit  Medication Sig Dispense Refill   acetaminophen (TYLENOL) 500 MG tablet Take 500 mg by mouth 3 (three) times daily as needed for moderate pain.     aspirin 325 MG tablet Take 325 mg by mouth daily.     atenolol (TENORMIN) 50 MG tablet Take 1 to 2 tablets every Morning for BP                                               /  TAKE                                                   BY                         MOUTH (Patient taking differently: Take 100 mg by mouth daily. Take 1 to 2 tablets every Morning for BP                                               /                            TAKE                                                   BY                         MOUTH) 180 tablet 3   blood glucose meter kit and supplies KIT Check blood sugar 1 time daily-DX-E11.22 1 each 0   blood glucose meter kit and supplies Test sugar once a day. 1 each 0   Blood Glucose Monitoring Suppl DEVI Test sugars once daily.DX diabetes 1 each 0   Cholecalciferol (VITAMIN D PO) Take 10,000 Units by mouth. 2 times a week no W and F     cloNIDine (CATAPRES) 0.2 MG tablet Take      1 tablet       3 x /day        for BP. Take if BP > 160 (Patient taking differently: Take 0.2 mg by mouth 3 (three) times daily. Take      1 tablet       3 x /day        for BP. Take if BP > 160) 270 tablet 1   Cyanocobalamin (VITAMIN B-12 PO) Take 1 tablet by mouth daily.  5000 mc one time in the AM PRN     glucose blood test strip Test  Blood Sugar  Daily  (Dx:  e11.29  )) 100 each 3   iron polysaccharides (NIFEREX) 150 MG capsule Take 1 capsule (150 mg total) by mouth daily. 30 capsule 1   itraconazole (SPORANOX) 100 MG capsule Take 2 caps by mouth daily 60 capsule 2   Lancets MISC Test blood sugar once daily 100 each 11   meloxicam (MOBIC) 15 MG tablet Take 1 tablet (15 mg total) by mouth daily as needed for pain. 30 tablet 3   metFORMIN (GLUCOPHAGE-XR) 500 MG 24 hr tablet Take  2 tablets   2 x /day   with Meals  for Diabetes (Patient taking differently: Take 1,000 mg by mouth 2 (two) times daily with a meal. Take  2 tablets   2 x /day   with Meals  for Diabetes) 360 tablet 3   minoxidil (LONITEN) 10 MG tablet Take  1 tablet  Daily for BP                                       /  TAKE                       BY                          MOUTH (Patient taking differently: Take 10 mg by mouth daily. Take  1 tablet  Daily for BP                                       /                                                TAKE                       BY                          MOUTH) 90 tablet 3   olmesartan (BENICAR) 40 MG tablet Take 1 tablet every Evening for BP (Patient taking differently: Take 40 mg by mouth daily. Take 1 tablet every Evening for BP) 90 tablet 3   oxybutynin (DITROPAN-XL) 10 MG 24 hr tablet Take  1 tablet  Daily for Bladder Control                                              /                          TAKE                       BY                       MOUTH (Patient taking differently: Take 10 mg by mouth at bedtime. Take  1 tablet  Daily for Bladder Control                                              /                          TAKE                       BY                       MOUTH) 90 tablet 3   rosuvastatin (CRESTOR) 20 MG tablet Take  1 tablet  Daily for Cholesterol  3 days a week- Monday, Wednesday, Saturday  39 tablet 3   sertraline (ZOLOFT) 100 MG tablet Take 1 tablet (100 mg total) by mouth daily. 30 tablet 0   verapamil (CALAN-SR) 240 MG CR tablet TAKE 1 TABLET BY MOUTH IN THE EVENING WITH FOOD 90 tablet 0   No facility-administered medications prior to visit.    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   Sleep apnea    Tubulovillous adenoma of colon    Type II or unspecified type diabetes mellitus without mention of complication, not stated as uncontrolled     PAST SURGICAL HISTORY: Past Surgical History:  Procedure Laterality Date   APPENDECTOMY     BREAST BIOPSY Right    CARPAL TUNNEL RELEASE Right 1992   cataract extration     COLONOSCOPY     CYST REMOVAL HAND     TONSILLECTOMY  1952    FAMILY HISTORY: Family History  Problem Relation Age of Onset   Heart disease Mother    Diabetes Mother    Colon polyps Mother    Heart disease Father    Diabetes Father    Heart attack Father    Cerebral aneurysm Sister    Heart disease Brother    Heart attack Brother    Heart disease Brother        Stent   Colon cancer Neg Hx    Esophageal cancer Neg Hx    Stomach cancer Neg Hx    Rectal cancer Neg Hx    Breast cancer Neg Hx    Sleep apnea Neg Hx     SOCIAL HISTORY: Social History   Socioeconomic History   Marital status: Married    Spouse name: Alinda Money   Number of children: 1   Years of education: 12   Highest education level: Not on file  Occupational History   Occupation: Retired    Associate Professor: [employer]  Tobacco Use   Smoking status: Never   Smokeless tobacco: Never  Vaping Use   Vaping status: Never Used  Substance and Sexual Activity    Alcohol use: No   Drug use: No   Sexual activity: Never    Partners: Male    Comment: Married  Other Topics Concern   Not on file  Social History Narrative   Lives with husband.  Independent of ADLs. 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   Right-handed   Caffeine: about 1 cup of coffee per day, tea and Cokes a few times per week   Social Determinants of Health   Financial Resource Strain: Not on file  Food Insecurity: No Food Insecurity (06/11/2022)   Hunger Vital Sign    Worried About Running Out of Food in the Last Year: Never true    Ran Out of Food in the Last Year: Never true  Transportation Needs: No Transportation Needs (06/11/2022)   PRAPARE - Administrator, Civil Service (Medical): No    Lack of Transportation (Non-Medical): No  Physical Activity: Not on file  Stress: Not on file  Social Connections: Not on file  Intimate Partner Violence: Not At Risk (06/11/2022)   Humiliation, Afraid, Rape, and Kick questionnaire    Fear of Current or Ex-Partner: No    Emotionally Abused: No    Physically Abused: No    Sexually Abused: No      PHYSICAL EXAM  There were no vitals filed for this visit.   There is no height or weight  on file to calculate BMI.  Generalized: Well developed, in no acute distress  Chest: Lungs clear to auscultation bilaterally  Neurological examination  Mentation: Alert oriented to time, place, history taking. Follows all commands speech and language fluent Cranial nerve II-XII: Extraocular movements were full, visual field were full on confrontational test Head turning and shoulder shrug  were normal and symmetric. Motor: The motor testing reveals 5 over 5 strength of all 4 extremities. Good symmetric motor tone is noted throughout.  Sensory: Sensory testing is intact to soft touch on all 4 extremities. No evidence of extinction is noted.  Gait and station: Gait is normal.    DIAGNOSTIC DATA (LABS,  IMAGING, TESTING) - I reviewed patient records, labs, notes, testing and imaging myself where available.  Lab Results  Component Value Date   WBC 6.5 11/01/2022   HGB 12.4 11/01/2022   HCT 39.0 11/01/2022   MCV 79.4 (L) 11/01/2022   PLT 195 11/01/2022      Component Value Date/Time   NA 141 11/01/2022 1037   K 4.8 11/01/2022 1037   CL 104 11/01/2022 1037   CO2 27 11/01/2022 1037   GLUCOSE 97 11/01/2022 1037   BUN 15 11/01/2022 1037   CREATININE 1.02 (H) 11/01/2022 1037   CALCIUM 10.3 11/01/2022 1037   PROT 7.5 11/01/2022 1037   ALBUMIN 4.0 06/10/2022 1531   AST 51 (H) 11/01/2022 1037   ALT 40 (H) 11/01/2022 1037   ALKPHOS 51 06/10/2022 1531   BILITOT 1.0 11/01/2022 1037   GFRNONAA >60 06/12/2022 0437   GFRNONAA 68 09/28/2020 1021   GFRAA 79 09/28/2020 1021   Lab Results  Component Value Date   CHOL 161 11/01/2022   HDL 49 (L) 11/01/2022   LDLCALC 80 11/01/2022   TRIG 228 (H) 11/01/2022   CHOLHDL 3.3 11/01/2022   Lab Results  Component Value Date   HGBA1C 6.2 (H) 11/01/2022   Lab Results  Component Value Date   VITAMINB12 408 06/12/2022   Lab Results  Component Value Date   TSH 1.30 11/01/2022      ASSESSMENT AND PLAN 77 y.o. year old female  has a past medical history of Anemia, Anxiety, Arthritis, Carpal tunnel syndrome, Cerebrovascular disease (01/02/2017), Duodenal diverticulum, Elevated uric acid in blood, Esophageal motility disorder, Fatty liver, GERD (gastroesophageal reflux disease), Hyperlipidemia, Hypertension, Lymphocytic colitis (09/22/12), Sleep apnea, Tubulovillous adenoma of colon, and Type II or unspecified type diabetes mellitus without mention of complication, not stated as uncontrolled. here with:  OSA on CPAP  - CPAP compliance excellent - Good treatment of AHI  - Encourage patient to use CPAP nightly and > 4 hours each night - F/U in 1 year or sooner if needed   Michele Penny, MSN, NP-C 01/26/2023, 2:46 PM [facility] [address] [place], [place] [address] [phone]

## 2023-01-27 ENCOUNTER — Encounter: Payer: Self-pay | Admitting: Adult Health

## 2023-01-27 ENCOUNTER — Ambulatory Visit: Payer: PPO | Admitting: Adult Health

## 2023-01-27 VITALS — BP 150/72 | HR 55 | Ht 63.0 in | Wt 152.0 lb

## 2023-01-27 DIAGNOSIS — G4733 Obstructive sleep apnea (adult) (pediatric): Secondary | ICD-10-CM | POA: Diagnosis not present

## 2023-01-28 ENCOUNTER — Other Ambulatory Visit: Payer: Self-pay | Admitting: Nurse Practitioner

## 2023-01-28 ENCOUNTER — Other Ambulatory Visit: Payer: Self-pay | Admitting: Internal Medicine

## 2023-01-28 DIAGNOSIS — H18513 Endothelial corneal dystrophy, bilateral: Secondary | ICD-10-CM | POA: Diagnosis not present

## 2023-01-28 DIAGNOSIS — N3281 Overactive bladder: Secondary | ICD-10-CM

## 2023-01-28 DIAGNOSIS — Z961 Presence of intraocular lens: Secondary | ICD-10-CM | POA: Diagnosis not present

## 2023-01-28 DIAGNOSIS — I10 Essential (primary) hypertension: Secondary | ICD-10-CM

## 2023-01-28 DIAGNOSIS — E119 Type 2 diabetes mellitus without complications: Secondary | ICD-10-CM | POA: Diagnosis not present

## 2023-01-28 DIAGNOSIS — H5213 Myopia, bilateral: Secondary | ICD-10-CM | POA: Diagnosis not present

## 2023-01-28 LAB — HM DIABETES EYE EXAM

## 2023-01-29 ENCOUNTER — Telehealth: Payer: Self-pay | Admitting: *Deleted

## 2023-01-29 NOTE — Telephone Encounter (Signed)
Order to turn off heated tubing sent to [facility].

## 2023-01-30 NOTE — Telephone Encounter (Signed)
[facility] confirmed receipt of order.  

## 2023-02-05 ENCOUNTER — Encounter: Payer: Self-pay | Admitting: Internal Medicine

## 2023-02-05 NOTE — Progress Notes (Unsigned)
Future Appointments  Date Time Provider Department  11/01/2022              Michele [name]   02/06/2023                 9 mo ov 11:30 AM Lucky Cowboy, MD [facility]  04/24/2023              cpe  2:00 PM Lucky Cowboy, MD [facility]  01/27/2024 11:00 AM Michele Penny, NP [facility]    History of Present Illness:       [name] presents for 9 month follow up with HTN, HLD, T2_NIDDM and Vitamin D Deficiency. Patient has hx/o OSA, but is Mask Intolerant. Patient has hx/o Depression in partial remission.        Patient is treated for HTN  (1978)  & BP has been controlled at home. Today's BP is at goal -                    . Patient has had no complaints of any cardiac type chest pain, palpitations, dyspnea Michele [name] /PND, dizziness, claudication or dependent edema.        Hyperlipidemia is controlled with diet & Rosuvastatin.   Patient denies myalgias or other med SE's. 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  Last A1c was not at goal :  Lab Results  Component Value Date   HGBA1C 6.2 (H) 11/01/2022     Further, the patient also has history of Vitamin D Deficiency ("19"/2008) and supplements vitamin D. Last vitamin D was near  goal :  Lab Results  Component Value Date   VD25OH 57 04/18/2022       Current Outpatient Medications  Medication Instructions   acetaminophen  500 mg 3 times daily PRN   Aspirin  325 mg  Daily   atenolol  50 MG tablet Take 1 to 2 tablets every Morning    VITAMIN D   10,000 Units 2 times a week on W and F   cloNIDine  0.2 MG tablet Take  1 tablet 3 x /da for BP   VITAMIN B-12   5000 mc  1 tablet  Daily   iron polysaccharides     150 mg,  Daily   itraconazole (SPORANOX) 100 MG  Take 2 caps  daily   meloxicam  15 mg   Daily PRN   metFORMIN -XR 500 MG 2 Take  2 tablets   2 x /day   with Meals    minoxidil  10 MG tablet Take  1 tablet  Daily   olmesartan  40 MG tablet TAKE 1 TABLET IN THE EVENING   oxybutynin XL  10 mg Daily at bedtime   rosuvastatin  20 MG tablet Take  1 tablet  3 day/week- MonWedSat   sertraline  100 mg  Daily   verapamil  240 MG CR  TAKE 1 TABLET IN THE EVENING     Allergies  Allergen Reactions   Lipitor [Atorvastatin]     Elevates LFT's  PMHx:   Past Medical History:  Diagnosis Date   Anemia    Anxiety    Arthritis    Carpal tunnel syndrome    Cerebrovascular disease 01/02/2017   Duodenal diverticulum    Elevated uric acid in blood    Esophageal motility disorder    Fatty liver    GERD (gastroesophageal reflux disease)    Hyperlipidemia    Hypertension    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   Sleep apnea    Tubulovillous adenoma of colon    Type II or unspecified type diabetes mellitus without mention of complication, not stated as uncontrolled      Immunization History  Administered Date(s) Administered   Influenza Split 03/25/2013   Influenza, High Dose 02/18/2018, 03/11/2019, 03/20/2020, 03/30/2021   PFIZER -SARS-COV-2 Vacc 06/24/2019, 07/15/2019, 03/08/2020   Pneumococcal -13 01/26/2014   Pneumococcal -23 03/14/2011   Td 03/25/2013   Zoster, Live 06/03/2005    Past Surgical History:  Procedure Laterality Date   APPENDECTOMY     BREAST BIOPSY Right    CARPAL TUNNEL RELEASE Right 1992   cataract extration     COLONOSCOPY     CYST REMOVAL HAND     TONSILLECTOMY  1952    FHx:    Reviewed / unchanged  SHx:    Reviewed / unchanged   Systems Review:  Constitutional: Denies fever, chills, wt changes, headaches, insomnia, fatigue, night sweats, change in appetite. Eyes: Denies redness, blurred vision, diplopia,  discharge, itchy, watery eyes.  ENT: Denies discharge, congestion, post nasal drip, epistaxis, sore throat, earache, hearing loss, dental pain, tinnitus, vertigo, sinus pain, snoring.  CV: Denies chest pain, palpitations, irregular heartbeat, syncope, dyspnea, diaphoresis, orthopnea, PND, claudication or edema. Respiratory: denies cough, dyspnea, DOE, pleurisy, hoarseness, laryngitis, wheezing.  Gastrointestinal: Denies dysphagia, odynophagia, heartburn, reflux, water brash, abdominal pain or cramps, nausea, vomiting, bloating, diarrhea, constipation, hematemesis, melena, hematochezia  or hemorrhoids. Genitourinary: Denies dysuria, frequency, urgency, nocturia, hesitancy, discharge, hematuria or flank pain. Musculoskeletal: Denies arthralgias, myalgias, stiffness, jt. swelling, pain, limping or strain/sprain.  Skin: Denies pruritus, rash, hives, warts, acne, eczema or change in skin lesion(s). Neuro: No weakness, tremor, incoordination, spasms, paresthesia or pain. Psychiatric: Denies confusion, memory loss or sensory loss. Endo: Denies change in weight, skin or hair change.  Heme/Lymph: No excessive bleeding, bruising or enlarged lymph nodes.  Physical Exam  There were no vitals taken for this visit.  Appears  well nourished, well groomed  and in no distress.  Eyes: PERRLA, EOMs, conjunctiva no swelling or erythema. Sinuses: No frontal/maxillary tenderness ENT/Mouth: EAC's clear, TM's nl w/o erythema, bulging. Nares clear w/o erythema, swelling, exudates. Oropharynx clear without erythema or exudates. Oral hygiene is good. Tongue normal, non obstructing. Hearing intact.  Neck: Supple. Thyroid not palpable. Car 2+/2+ without bruits, nodes or JVD. Chest: Respirations nl with BS clear & equal w/o rales, rhonchi, wheezing or stridor.  Cor: Heart sounds normal w/ regular rate and rhythm without sig. murmurs, gallops, clicks or rubs. Peripheral pulses normal and equal  without edema.  Abdomen:  Soft & bowel sounds normal. Non-tender w/o guarding, rebound, hernias, masses or organomegaly.  Lymphatics: Unremarkable.  Musculoskeletal: Full ROM all peripheral extremities, joint stability, 5/5 strength and normal gait.  Skin: Warm, dry without exposed rashes, lesions or ecchymosis apparent.  Neuro: Cranial nerves intact, reflexes equal bilaterally. Sensory-motor testing grossly intact. Tendon reflexes grossly intact.  Pysch: Alert & oriented x 3.  Insight and judgement nl & appropriate. No ideations.  Assessment and Plan:  1. Essential hypertension   - Continue medication, monitor blood pressure at home.  - Continue DASH diet.  Reminder to go to the ER if any CP,  SOB, nausea, dizziness, severe HA, changes vision/speech.    - CBC with Differential/Platelet - COMPLETE METABOLIC PANEL WITH GFR - Magnesium - TSH   2. Hyperlipidemia associated with type 2 diabetes mellitus (HCC)   - Continue diet/meds, exercise,& lifestyle modifications.  - Continue monitor periodic cholesterol/liver & renal functions    - Lipid panel - TSH   3. Type 2 diabetes mellitus with stage 2 chronic kidney  disease, without long-term current use of insulin (HCC)   - Continue diet, exercise  - Lifestyle modifications.  - Monitor appropriate labs   - Hemoglobin A1c - Insulin, random   4. Vitamin D deficiency   - Continue supplementation     - VITAMIN D 25 Hydroxy    5. Depression, major, recurrent, in partial remission (HCC)  - TSH   6. Medication management  - CBC with Differential/Platelet - COMPLETE METABOLIC PANEL WITH GFR - Magnesium - Lipid panel - TSH - Hemoglobin A1c - Insulin, random - VITAMIN D 25 Hydroxy           Discussed  regular exercise, BP monitoring, weight control to achieve/maintain BMI less than 25 and discussed med and SE's.  Will try short course of steroids for her bilateral shoulder & Low back pain w/o sciatica. Recommended labs to assess /monitor  clinical status .  I discussed the assessment and treatment plan with the patient. The patient was provided an opportunity to ask questions and all were answered. The patient agreed with the plan and demonstrated an understanding of the instructions.  I provided over 30 minutes of exam, counseling, chart review and  complex critical decision making.         The patient was advised to call back or seek an in-person evaluation if the symptoms worsen or if the condition fails to improve as anticipated.   Marinus Maw, MD

## 2023-02-05 NOTE — Patient Instructions (Signed)

## 2023-02-06 ENCOUNTER — Ambulatory Visit (INDEPENDENT_AMBULATORY_CARE_PROVIDER_SITE_OTHER): Payer: PPO | Admitting: Internal Medicine

## 2023-02-06 ENCOUNTER — Encounter: Payer: Self-pay | Admitting: Internal Medicine

## 2023-02-06 VITALS — BP 134/86 | HR 59 | Temp 97.9°F | Resp 16 | Ht 63.0 in | Wt 152.0 lb

## 2023-02-06 DIAGNOSIS — E559 Vitamin D deficiency, unspecified: Secondary | ICD-10-CM

## 2023-02-06 DIAGNOSIS — F3341 Major depressive disorder, recurrent, in partial remission: Secondary | ICD-10-CM

## 2023-02-06 DIAGNOSIS — S81811A Laceration without foreign body, right lower leg, initial encounter: Secondary | ICD-10-CM | POA: Diagnosis not present

## 2023-02-06 DIAGNOSIS — Z79899 Other long term (current) drug therapy: Secondary | ICD-10-CM | POA: Diagnosis not present

## 2023-02-06 DIAGNOSIS — E1122 Type 2 diabetes mellitus with diabetic chronic kidney disease: Secondary | ICD-10-CM | POA: Diagnosis not present

## 2023-02-06 DIAGNOSIS — R0989 Other specified symptoms and signs involving the circulatory and respiratory systems: Secondary | ICD-10-CM | POA: Diagnosis not present

## 2023-02-06 DIAGNOSIS — E1169 Type 2 diabetes mellitus with other specified complication: Secondary | ICD-10-CM

## 2023-02-06 DIAGNOSIS — N182 Chronic kidney disease, stage 2 (mild): Secondary | ICD-10-CM | POA: Diagnosis not present

## 2023-02-06 DIAGNOSIS — N3281 Overactive bladder: Secondary | ICD-10-CM

## 2023-02-06 DIAGNOSIS — E785 Hyperlipidemia, unspecified: Secondary | ICD-10-CM | POA: Diagnosis not present

## 2023-02-06 DIAGNOSIS — B351 Tinea unguium: Secondary | ICD-10-CM

## 2023-02-06 DIAGNOSIS — I10 Essential (primary) hypertension: Secondary | ICD-10-CM | POA: Diagnosis not present

## 2023-02-06 MED ORDER — TERBINAFINE HCL 250 MG PO TABS: ORAL_TABLET | ORAL | 0 refills | Status: DC

## 2023-02-06 MED ORDER — CLONIDINE HCL 0.2 MG PO TABS
ORAL_TABLET | ORAL | 3 refills | Status: DC
Start: 2023-02-06 — End: 2023-04-20

## 2023-02-06 MED ORDER — ATENOLOL 100 MG PO TABS: ORAL_TABLET | ORAL | Status: DC

## 2023-02-06 MED ORDER — METFORMIN HCL ER 500 MG PO TB24
ORAL_TABLET | ORAL | 3 refills | Status: DC
Start: 2023-02-06 — End: 2023-09-04

## 2023-02-06 MED ORDER — MINOXIDIL 10 MG PO TABS
ORAL_TABLET | ORAL | Status: DC
Start: 2023-02-06 — End: 2023-04-19

## 2023-02-06 MED ORDER — MIRABEGRON ER 50 MG PO TB24
ORAL_TABLET | ORAL | 0 refills | Status: DC
Start: 2023-02-06 — End: 2023-04-26

## 2023-02-07 LAB — CBC WITH DIFFERENTIAL/PLATELET
Absolute Monocytes: 573 {cells}/uL (ref 200–950)
Basophils Absolute: 20 {cells}/uL (ref 0–200)
Basophils Relative: 0.4 %
Eosinophils Absolute: 123 {cells}/uL (ref 15–500)
Eosinophils Relative: 2.5 %
HCT: 34.3 % — ABNORMAL LOW (ref 35.0–45.0)
Hemoglobin: 11.3 g/dL — ABNORMAL LOW (ref 11.7–15.5)
Lymphs Abs: 1465 {cells}/uL (ref 850–3900)
MCH: 26.1 pg — ABNORMAL LOW (ref 27.0–33.0)
MCHC: 32.9 g/dL (ref 32.0–36.0)
MCV: 79.2 fL — ABNORMAL LOW (ref 80.0–100.0)
MPV: 9.7 fL (ref 7.5–12.5)
Monocytes Relative: 11.7 %
Neutro Abs: 2720 {cells}/uL (ref 1500–7800)
Neutrophils Relative %: 55.5 %
Platelets: 156 10*3/uL (ref 140–400)
RBC: 4.33 10*6/uL (ref 3.80–5.10)
RDW: 15.5 % — ABNORMAL HIGH (ref 11.0–15.0)
Total Lymphocyte: 29.9 %
WBC: 4.9 10*3/uL (ref 3.8–10.8)

## 2023-02-07 LAB — MAGNESIUM: Magnesium: 1.9 mg/dL (ref 1.5–2.5)

## 2023-02-07 LAB — HEMOGLOBIN A1C
Hgb A1c MFr Bld: 6.2 %{Hb} — ABNORMAL HIGH (ref <5.7)
Mean Plasma Glucose: 131 mg/dL
eAG (mmol/L): 7.3 mmol/L

## 2023-02-07 LAB — COMPLETE METABOLIC PANEL WITH GFR
AG Ratio: 1.8 (calc) (ref 1.0–2.5)
ALT: 20 U/L (ref 6–29)
AST: 30 U/L (ref 10–35)
Albumin: 4.4 g/dL (ref 3.6–5.1)
Alkaline phosphatase (APISO): 69 U/L (ref 37–153)
BUN: 14 mg/dL (ref 7–25)
CO2: 26 mmol/L (ref 20–32)
Calcium: 9.7 mg/dL (ref 8.6–10.4)
Chloride: 105 mmol/L (ref 98–110)
Creat: 0.96 mg/dL (ref 0.60–1.00)
Globulin: 2.4 g/dL (ref 1.9–3.7)
Glucose, Bld: 93 mg/dL (ref 65–99)
Potassium: 4.4 mmol/L (ref 3.5–5.3)
Sodium: 141 mmol/L (ref 135–146)
Total Bilirubin: 1 mg/dL (ref 0.2–1.2)
Total Protein: 6.8 g/dL (ref 6.1–8.1)
eGFR: 61 mL/min/{1.73_m2} (ref >=60)

## 2023-02-07 LAB — LIPID PANEL
Cholesterol: 115 mg/dL (ref <200)
HDL: 53 mg/dL (ref >=50)
LDL Cholesterol (Calc): 40 mg/dL
Non-HDL Cholesterol (Calc): 62 mg/dL (ref <130)
Total CHOL/HDL Ratio: 2.2 (calc) (ref <5.0)
Triglycerides: 136 mg/dL (ref <150)

## 2023-02-07 LAB — INSULIN, RANDOM: Insulin: 9.3 u[IU]/mL

## 2023-02-07 LAB — VITAMIN D 25 HYDROXY (VIT D DEFICIENCY, FRACTURES): Vit D, 25-Hydroxy: 46 ng/mL (ref 30–100)

## 2023-02-07 LAB — TSH: TSH: 0.92 m[IU]/L (ref 0.40–4.50)

## 2023-02-08 NOTE — Progress Notes (Signed)
<>*<>*<>*<>*<>*<>*<>*<>*<>*<>*<>*<>*<>*<>*<>*<>*<>*<>*<>*<>*<>*<>*<>*<>*<> <>*<>*<>*<>*<>*<>*<>*<>*<>*<>*<>*<>*<>*<>*<>*<>*<>*<>*<>*<>*<>*<>*<>*<>*<>  -Test results slightly outside the reference range are not unusual. If there is anything important, I will review this with you,  otherwise it is considered normal test values.  If you have further questions,  please do not hesitate to contact me at the office or via My Chart.   <>*<>*<>*<>*<>*<>*<>*<>*<>*<>*<>*<>*<>*<>*<>*<>*<>*<>*<>*<>*<>*<>*<>*<>*<> <>*<>*<>*<>*<>*<>*<>*<>*<>*<>*<>*<>*<>*<>*<>*<>*<>*<>*<>*<>*<>*<>*<>*<>*<>  -  A1c =- 6.2%  Blood sugar and A1c are   STILL   elevated in the borderline and  early or pre-diabetes range which has the same   300% increased risk for heart attack, stroke, cancer and                                       alzheimer- type vascular dementia as full blown diabetes.   But the good news is that diet, exercise with                                                 weight loss can cure the early diabetes at this point.   -   It is very important that you work harder with diet by                  Avoiding all foods that are white except chicken, fish & calliflower.  - Avoid white rice  (brown & wild rice is OK),   - Avoid white potatoes  (sweet potatoes in moderation is OK),   White bread or wheat bread or anything made out of                                  white flour like bagels, donuts, rolls, buns, biscuits, cakes,  - pastries, cookies, pizza crust, and pasta (made from white flour & egg whites)   - vegetarian pasta or spinach or wheat pasta is OK.  - Multigrain breads like Arnold's, Pepperidge [employer] or   multigrain sandwich thins or high fiber breads like   Eureka bread or "Dave's Killer" breads that are                                                            4 to 5 grams fiber per slice !  are best.    Diet, exercise and weight loss can reverse and cure                                                                               diabetes in the early stages.    - Diet, exercise and weight loss is very important in the   control and prevention of complications of diabetes which  affects every system in your body, ie.   -Brain - dementia/stroke,  - eyes - glaucoma/blindness,  -  heart - heart attack/heart failure,  - kidneys - dialysis,  - stomach - gastric paralysis,  - intestines - malabsorption,  - nerves - severe painful neuritis,  - circulation - gangrene & loss of a leg(s)  - and finally  . . . . . . . . . . . . . . . . . .    - cancer and Alzheimers.  <>*<>*<>*<>*<>*<>*<>*<>*<>*<>*<>*<>*<>*<>*<>*<>*<>*<>*<>*<>*<>*<>*<>*<>*<> <>*<>*<>*<>*<>*<>*<>*<>*<>*<>*<>*<>*<>*<>*<>*<>*<>*<>*<>*<>*<>*<>*<>*<>*<>  -   Chol = 115    Excellent   - Very low risk for Heart Attack  / Stroke  <>*<>*<>*<>*<>*<>*<>*<>*<>*<>*<>*<>*<>*<>*<>*<>*<>*<>*<>*<>*<>*<>*<>*<>*<> <>*<>*<>*<>*<>*<>*<>*<>*<>*<>*<>*<>*<>*<>*<>*<>*<>*<>*<>*<>*<>*<>*<>*<>*<>  -  Vitamin D = 46  is still Very Low  ( bad )   - Recommend change & take your    Vitamin D    5,000 units every day   - Vitamin D goal is between 70-100.    - It is very important as a natural anti-inflammatory and helping the                           immune system protect against viral infections, like the Covid-19    helping hair, skin, and nails, as well as reducing stroke and heart attack risk.   - It helps your bones and helps with mood.  - It also decreases numerous cancer risks   - Low Vit D is associated with a 200-300% higher risk for CANCER   and 200-300% higher risk for HEART   ATTACK  &  STROKE.    - It is also associated with higher death rate at younger ages,   autoimmune diseases like Rheumatoid arthritis, Lupus, Multiple Sclerosis.     - Also many other serious conditions, like depression, Alzheimer's  Dementia,  muscle aches, fatigue, fibromyalgia    <>*<>*<>*<>*<>*<>*<>*<>*<>*<>*<>*<>*<>*<>*<>*<>*<>*<>*<>*<>*<>*<>*<>*<>*<>  <>*<>*<>*<>*<>*<>*<>*<>*<>*<>*<>*<>*<>*<>*<>*<>*<>*<>*<>*<>*<>*<>*<>*<>*<>  -  All Else - CBC - Kidneys - Electrolytes - Liver - Magnesium & Thyroid    - all  Normal / OK  <>*<>*<>*<>*<>*<>*<>*<>*<>*<>*<>*<>*<>*<>*<>*<>*<>*<>*<>*<>*<>*<>*<>*<>*<> <>*<>*<>*<>*<>*<>*<>*<>*<>*<>*<>*<>*<>*<>*<>*<>*<>*<>*<>*<>*<>*<>*<>*<>*<>

## 2023-02-10 ENCOUNTER — Encounter: Payer: Self-pay | Admitting: Internal Medicine

## 2023-02-10 ENCOUNTER — Other Ambulatory Visit: Payer: Self-pay | Admitting: Internal Medicine

## 2023-02-10 DIAGNOSIS — Z1231 Encounter for screening mammogram for malignant neoplasm of breast: Secondary | ICD-10-CM

## 2023-02-14 ENCOUNTER — Ambulatory Visit
Admission: RE | Admit: 2023-02-14 | Discharge: 2023-02-14 | Disposition: A | Discharge status: Home / Self Care | Payer: PPO | Source: Ambulatory Visit | Attending: Internal Medicine | Admitting: Internal Medicine

## 2023-02-14 DIAGNOSIS — Z1231 Encounter for screening mammogram for malignant neoplasm of breast: Secondary | ICD-10-CM | POA: Diagnosis not present

## 2023-02-25 ENCOUNTER — Ambulatory Visit (AMBULATORY_SURGERY_CENTER): Payer: PPO | Admitting: *Deleted

## 2023-02-25 VITALS — Ht 63.0 in | Wt 146.0 lb

## 2023-02-25 DIAGNOSIS — Z86010 Personal history of colonic polyps: Secondary | ICD-10-CM

## 2023-02-25 MED ORDER — NA SULFATE-K SULFATE-MG SULF 17.5-3.13-1.6 GM/177ML PO SOLN
1.0000 | Freq: Once | ORAL | 0 refills | Status: AC
Start: 2023-02-25 — End: 2023-02-25

## 2023-02-25 NOTE — Progress Notes (Signed)

## 2023-02-27 ENCOUNTER — Encounter: Payer: Self-pay | Admitting: Internal Medicine

## 2023-03-18 ENCOUNTER — Ambulatory Visit: Payer: PPO

## 2023-03-20 ENCOUNTER — Ambulatory Visit: Payer: PPO

## 2023-03-20 VITALS — Temp 97.9°F

## 2023-03-20 DIAGNOSIS — Z23 Encounter for immunization: Secondary | ICD-10-CM

## 2023-03-27 ENCOUNTER — Encounter: Payer: Self-pay | Admitting: Internal Medicine

## 2023-03-27 ENCOUNTER — Ambulatory Visit: Payer: PPO | Admitting: Internal Medicine

## 2023-03-27 VITALS — BP 116/45 | HR 70 | Temp 98.0°F | Resp 18 | Ht 63.0 in | Wt 146.0 lb

## 2023-03-27 DIAGNOSIS — F419 Anxiety disorder, unspecified: Secondary | ICD-10-CM | POA: Diagnosis not present

## 2023-03-27 DIAGNOSIS — G4733 Obstructive sleep apnea (adult) (pediatric): Secondary | ICD-10-CM | POA: Diagnosis not present

## 2023-03-27 DIAGNOSIS — Z860101 Personal history of adenomatous and serrated colon polyps: Secondary | ICD-10-CM | POA: Diagnosis not present

## 2023-03-27 DIAGNOSIS — Z09 Encounter for follow-up examination after completed treatment for conditions other than malignant neoplasm: Secondary | ICD-10-CM | POA: Diagnosis not present

## 2023-03-27 DIAGNOSIS — Z860100 Personal history of colon polyps, unspecified: Secondary | ICD-10-CM | POA: Diagnosis not present

## 2023-03-27 DIAGNOSIS — K760 Fatty (change of) liver, not elsewhere classified: Secondary | ICD-10-CM | POA: Diagnosis not present

## 2023-03-27 DIAGNOSIS — I10 Essential (primary) hypertension: Secondary | ICD-10-CM | POA: Diagnosis not present

## 2023-03-27 MED ORDER — SODIUM CHLORIDE 0.9 % IV SOLN: 500.0000 mL | INTRAVENOUS | Status: DC

## 2023-03-27 NOTE — Progress Notes (Signed)
Sedate, gd SR, tolerated procedure well, VSS, report to RN 

## 2023-03-27 NOTE — Progress Notes (Signed)
Pt's states no medical or surgical changes since previsit or office visit. 

## 2023-03-27 NOTE — Op Note (Signed)
[facility] Patient Name: Michele [name] Procedure Date: 03/27/2023 11:41 AM MRN: [id number] Endoscopist: Wilhemina Bonito. Marina Goodell , MD, [id number] Age: 77 Referring MD:  Date of Birth: Aug 09, 1945 Gender: Female Account #: [id number] Procedure:                Colonoscopy Indications:              High risk colon cancer surveillance: Personal                            history of adenoma (10 mm or greater in size), High                            risk colon cancer surveillance: Personal history of                            adenoma with villous component, High risk colon                            cancer surveillance: Personal history of multiple                            (3 or more) adenomas. Previous examinations 2007,                            2014, 2019 Medicines:                Monitored Anesthesia Care Procedure:                Pre-Anesthesia Assessment:                           - Prior to the procedure, a History and Physical                            was performed, and patient medications and                            allergies were reviewed. The patient's tolerance of                            previous anesthesia was also reviewed. The risks                            and benefits of the procedure and the sedation                            options and risks were discussed with the patient.                            All questions were answered, and informed consent                            was obtained. Prior Anticoagulants: The patient has  taken no anticoagulant or antiplatelet agents. ASA                            Grade Assessment: II - A patient with mild systemic                            disease. After reviewing the risks and benefits,                            the patient was deemed in satisfactory condition to                            undergo the procedure.                           After obtaining informed consent, the  colonoscope                            was passed under direct vision. Throughout the                            procedure, the patient's blood pressure, pulse, and                            oxygen saturations were monitored continuously. The                            CF [id number] #[id number] was introduced through the anus                            and advanced to the the cecum, identified by                            appendiceal orifice and ileocecal valve. The                            ileocecal valve, appendiceal orifice, and rectum                            were photographed. The quality of the bowel                            preparation was good. The colonoscopy was performed                            without difficulty. The patient tolerated the                            procedure well. The bowel preparation used was                            SUPREP via split dose instruction. Scope In: 11:52:31 AM Scope Out: 12:04:28 PM Scope Withdrawal Time: 0 hours 8 minutes 31 seconds  Total Procedure Duration:  0 hours 11 minutes 57 seconds  Findings:                 The entire examined colon appeared normal on direct                            and retroflexion views. Hypertrophic anal papillae                            noted. Complications:            No immediate complications. Estimated blood loss:                            None. Estimated Blood Loss:     Estimated blood loss: none. Impression:               - The entire examined colon is normal on direct and                            retroflexion views. Hypertrophic anal papillae noted                           - No specimens collected. Recommendation:           - Repeat colonoscopy is not recommended for                            surveillance.                           - Patient has a contact number available for                            emergencies. The signs and symptoms of potential                            delayed  complications were discussed with the                            patient. Return to normal activities tomorrow.                            Written discharge instructions were provided to the                            patient.                           - Resume previous diet.                           - Continue present medications. Wilhemina Bonito. Marina Goodell, MD 03/27/2023 12:08:47 PM This report has been signed electronically.

## 2023-03-27 NOTE — Progress Notes (Signed)
HISTORY OF PRESENT ILLNESS:  Michele [name] is a 77 y.o. female with a history of multiple and advanced adenomatous colon polyps.  Now for colonoscopy  REVIEW OF SYSTEMS:  All non-GI ROS negative except for  Past Medical History:  Diagnosis Date   Anemia    Anxiety    Arthritis    Carpal tunnel syndrome    Cataract    bilateral,removed   Cerebrovascular disease 01/02/2017   Duodenal diverticulum    Elevated uric acid in blood    Esophageal motility disorder    Fatty liver    GERD (gastroesophageal reflux disease)    Hyperlipidemia    Hypertension    Lymphocytic colitis 09/22/2012   Sleep apnea    Tubulovillous adenoma of colon    Type II or unspecified type diabetes mellitus without mention of complication, not stated as uncontrolled     Past Surgical History:  Procedure Laterality Date   APPENDECTOMY     BREAST BIOPSY Right    CARPAL TUNNEL RELEASE Right 1992   cataract extration     COLONOSCOPY     CYST REMOVAL HAND     TONSILLECTOMY  1952    Social History Michele [name]  reports that she has never smoked. She has never used smokeless tobacco. She reports that she does not drink alcohol and does not use drugs.  family history includes Cerebral aneurysm in her sister; Diabetes in her father and mother; Heart attack in her brother and father; Heart disease in her brother, brother, father, and mother.  Allergies  Allergen Reactions   Lipitor [Atorvastatin]     Elevates LFT's       PHYSICAL EXAMINATION: Vital signs: BP (!) 159/83   Pulse 60   Temp 98 F (36.7 C)   Ht 5\' 3"  (1.6 m)   Wt 146 lb (66.2 kg)   SpO2 98%   BMI 25.86 kg/m  General: Well-developed, well-nourished, no acute distress HEENT: Sclerae are anicteric, conjunctiva pink. Oral mucosa intact Lungs: Clear Heart: Regular Abdomen: soft, nontender, nondistended, no obvious ascites, no peritoneal signs, normal bowel sounds. No organomegaly. Extremities: No edema Psychiatric: alert and  oriented x3. Cooperative     ASSESSMENT:  History of multiple and advanced adenomatous colon polyps   PLAN:  Surveillance colonoscopy

## 2023-03-27 NOTE — Patient Instructions (Addendum)
-   Resume previous diet - Continue present medications.  YOU HAD AN ENDOSCOPIC PROCEDURE TODAY AT THE [facility]:   Refer to the procedure report that was given to you for any specific questions about what was found during the examination.  If the procedure report does not answer your questions, please call your gastroenterologist to clarify.  If you requested that your care partner not be given the details of your procedure findings, then the procedure report has been included in a sealed envelope for you to review at your convenience later.  YOU SHOULD EXPECT: Some feelings of bloating in the abdomen. Passage of more gas than usual.  Walking can help get rid of the air that was put into your GI tract during the procedure and reduce the bloating. If you had a lower endoscopy (such as a colonoscopy or flexible sigmoidoscopy) you may notice spotting of blood in your stool or on the toilet paper. If you underwent a bowel prep for your procedure, you may not have a normal bowel movement for a few days.  Please Note:  You might notice some irritation and congestion in your nose or some drainage.  This is from the oxygen used during your procedure.  There is no need for concern and it should clear up in a day or so.  SYMPTOMS TO REPORT IMMEDIATELY:  Following lower endoscopy (colonoscopy or flexible sigmoidoscopy):  Excessive amounts of blood in the stool  Significant tenderness or worsening of abdominal pains  Swelling of the abdomen that is new, acute  Fever of 100F or higher  For urgent or emergent issues, a gastroenterologist can be reached at any hour by calling [phone]. Do not use MyChart messaging for urgent concerns.    DIET:  We do recommend a small meal at first, but then you may proceed to your regular diet.  Drink plenty of fluids but you should avoid alcoholic beverages for 24 hours.  ACTIVITY:  You should plan to take it easy for the rest of today and you should  NOT DRIVE or use heavy machinery until tomorrow (because of the sedation medicines used during the test).    FOLLOW UP: Our staff will call the number listed on your records the next business day following your procedure.  We will call around 7:15- 8:00 am to check on you and address any questions or concerns that you may have regarding the information given to you following your procedure. If we do not reach you, we will leave a message.     SIGNATURES/CONFIDENTIALITY: You and/or your care partner have signed paperwork which will be entered into your electronic medical record.  These signatures attest to the fact that that the information above on your After Visit Summary has been reviewed and is understood.  Full responsibility of the confidentiality of this discharge information lies with you and/or your care-partner.

## 2023-03-28 ENCOUNTER — Telehealth: Payer: Self-pay

## 2023-03-28 NOTE — Telephone Encounter (Signed)
  Follow up Call-     03/27/2023   11:02 AM  Call back number  Post procedure Call Back phone  # [phone]  Permission to leave phone message Yes     Patient questions:  Do you have a fever, pain , or abdominal swelling? No. Pain Score  0 *  Have you tolerated food without any problems? Yes.    Have you been able to return to your normal activities? Yes.    Do you have any questions about your discharge instructions: Diet   No. Medications  No. Follow up visit  No.  Do you have questions or concerns about your Care? No.  Actions: * If pain score is 4 or above: No action needed, pain <4.

## 2023-03-31 ENCOUNTER — Other Ambulatory Visit: Payer: Self-pay | Admitting: Nurse Practitioner

## 2023-03-31 DIAGNOSIS — E782 Mixed hyperlipidemia: Secondary | ICD-10-CM

## 2023-04-01 NOTE — Progress Notes (Unsigned)
Assessment and Plan: Kaydi was seen today for fall.  Diagnoses and all orders for this visit:  Essential hypertension - continue medications, DASH diet, exercise and monitor at home. Call if greater than 130/80.   Hip pain, acute, right - [facility] to [facility] for evaluation of possible fractured right hip      Further disposition pending results of labs. Discussed med's effects and SE's.   Over 30 minutes of exam, counseling, chart review, and critical decision making was performed.   Future Appointments  Date Time Provider Department Center  04/24/2023  2:00 PM Lucky Cowboy, MD [facility] None  01/27/2024 11:00 AM [name], NP [facility] None    ------------------------------------------------------------------------------------------------------------------   HPI BP 138/72   Pulse 84   Temp 98.2 F (36.8 C)   Ht 5\' 3"  (1.6 m)   Wt 155 lb 6.4 oz (70.5 kg)   SpO2 96%   BMI 27.53 kg/m   77 y.o.female presents for fall onto hardwood floor 3 days ago.  She was going to the bathroom in the middle of the night holding onto closet door which moved and she fell to the floor. Her right hip and leg hurt the worst. She has a large bruise on left buttock and upper thigh posterior. She describes the pain as an aching that is 5/10 and 8/10 with movement.  She has not used any ice.  Tylenol helps a little.     BP well controlled with Olmesartan 40 mg every day, verapamil 240 mg every day, atenolol 100 mg bid, minoxidil 10 mg every day, clonidine 0.2 mg 1 tab TID if systolic BP greater than 160 BP Readings from Last 3 Encounters:  04/02/23 138/72  03/27/23 (!) 116/45  02/06/23 134/86  Denies headaches, chest pain, shortness of breath and dizziness  BMI is Body mass index is 27.53 kg/m., she has not been working on diet and exercise. Wt Readings from Last 3 Encounters:  04/02/23 155 lb 6.4 oz (70.5 kg)  03/27/23 146 lb (66.2 kg)  02/25/23 146 lb (66.2 kg)       Past Medical History:  Diagnosis Date   Anemia    Anxiety    Arthritis    Carpal tunnel syndrome    Cataract    bilateral,removed   Cerebrovascular disease 01/02/2017   Duodenal diverticulum    Elevated uric acid in blood    Esophageal motility disorder    Fatty liver    GERD (gastroesophageal reflux disease)    Hyperlipidemia    Hypertension    Lymphocytic colitis 09/22/2012   Sleep apnea    Tubulovillous adenoma of colon    Type II or unspecified type diabetes mellitus without mention of complication, not stated as uncontrolled      Allergies  Allergen Reactions   Lipitor [Atorvastatin]     Elevates LFT's    Current Outpatient Medications on File Prior to Visit  Medication Sig   acetaminophen (TYLENOL) 500 MG tablet Take 500 mg by mouth 3 (three) times daily as needed for moderate pain.   aspirin 325 MG tablet Take 325 mg by mouth daily.   atenolol (TENORMIN) 100 MG tablet Take  1 tablet  every Morning for BP                                                                               /  TAKE                                                   BY                         MOUTH   blood glucose meter kit and supplies KIT Check blood sugar 1 time daily-DX-E11.22   blood glucose meter kit and supplies Test sugar once a day.   Blood Glucose Monitoring Suppl DEVI Test sugars once daily.DX diabetes   Cholecalciferol (VITAMIN D-3 PO) Take by mouth daily. Take one capsule daily   cloNIDine (CATAPRES) 0.2 MG tablet Take 1 tablet  3 x / day  for BP                                                          /                                                                   TAKE                                         BY                                                 MOUTH   Cyanocobalamin (VITAMIN B-12 PO) Take 1 tablet by mouth daily. 5000 mc one time in the AM PRN   glucose blood test strip Test  Blood Sugar  Daily  (Dx:  e11.29  ))   Lancets MISC  Test blood sugar once daily   metFORMIN (GLUCOPHAGE-XR) 500 MG 24 hr tablet Take  2 tablets   2 x /day   with Meals  for Diabetes   minoxidil (LONITEN) 10 MG tablet Take  1 tablet  Daily for BP                                                                                      /                                                TAKE  BY                          MOUTH   olmesartan (BENICAR) 40 MG tablet TAKE 1 TABLET BY MOUTH IN THE EVENING FOR BLOOD PRESSURE   oxybutynin (DITROPAN-XL) 10 MG 24 hr tablet Take 10 mg by mouth daily.   rosuvastatin (CRESTOR) 20 MG tablet TAKE 1 TABLET BY MOUTH IN THE MORNING   sertraline (ZOLOFT) 100 MG tablet Take 1 tablet (100 mg total) by mouth daily.   verapamil (CALAN-SR) 240 MG CR tablet TAKE 1 TABLET BY MOUTH IN THE EVENING WITH FOOD   mirabegron ER (MYRBETRIQ) 50 MG TB24 tablet Take  1 tablet  every Morning for  OAB (Overactive Bladder) (Patient not taking: Reported on 04/02/2023)   No current facility-administered medications on file prior to visit.    ROS: all negative except above.   Physical Exam:  BP 138/72   Pulse 84   Temp 98.2 F (36.8 C)   Ht 5\' 3"  (1.6 m)   Wt 155 lb 6.4 oz (70.5 kg)   SpO2 96%   BMI 27.53 kg/m   General Appearance: Well nourished, in no apparent distress. Eyes: PERRLA, EOMs, conjunctiva no swelling or erythema Neck: Supple, thyroid normal.  Respiratory: Respiratory effort normal, BS equal bilaterally without rales, rhonchi, wheezing or stridor.  Cardio: RRR with no MRGs. Brisk peripheral pulses without edema.  Musculoskeletal: Decreased ROM of right lower extremity, very tender with palpation over olecranon of right hip. Gait slow and antalgic Skin: Warm, dry. Very large hematoma of right upper leg/buttock Neuro: Cranial nerves intact. Normal muscle tone, no cerebellar symptoms. Sensation intact.  Psych: Awake and oriented X 3, normal affect, Insight and Judgment appropriate.     Raynelle Dick, NP 2:29 PM [facility]

## 2023-04-02 ENCOUNTER — Ambulatory Visit (INDEPENDENT_AMBULATORY_CARE_PROVIDER_SITE_OTHER): Payer: PPO | Admitting: Nurse Practitioner

## 2023-04-02 ENCOUNTER — Encounter: Payer: Self-pay | Admitting: Nurse Practitioner

## 2023-04-02 VITALS — BP 138/72 | HR 84 | Temp 98.2°F | Ht 63.0 in | Wt 155.4 lb

## 2023-04-02 DIAGNOSIS — M25551 Pain in right hip: Secondary | ICD-10-CM | POA: Diagnosis not present

## 2023-04-02 DIAGNOSIS — S7001XA Contusion of right hip, initial encounter: Secondary | ICD-10-CM | POA: Diagnosis not present

## 2023-04-02 DIAGNOSIS — I10 Essential (primary) hypertension: Secondary | ICD-10-CM | POA: Diagnosis not present

## 2023-04-02 NOTE — Patient Instructions (Signed)
Head to [facility] for evaluation of possible right hip fracture

## 2023-04-19 ENCOUNTER — Other Ambulatory Visit: Payer: Self-pay | Admitting: Internal Medicine

## 2023-04-19 ENCOUNTER — Other Ambulatory Visit: Payer: Self-pay | Admitting: Nurse Practitioner

## 2023-04-19 DIAGNOSIS — R0989 Other specified symptoms and signs involving the circulatory and respiratory systems: Secondary | ICD-10-CM

## 2023-04-19 DIAGNOSIS — I10 Essential (primary) hypertension: Secondary | ICD-10-CM

## 2023-04-24 ENCOUNTER — Ambulatory Visit (INDEPENDENT_AMBULATORY_CARE_PROVIDER_SITE_OTHER): Payer: PPO | Admitting: Internal Medicine

## 2023-04-24 ENCOUNTER — Encounter: Payer: Self-pay | Admitting: Internal Medicine

## 2023-04-24 ENCOUNTER — Other Ambulatory Visit: Payer: Self-pay

## 2023-04-24 VITALS — BP 128/70 | HR 63 | Temp 98.1°F | Resp 16 | Ht 63.0 in | Wt 147.8 lb

## 2023-04-24 DIAGNOSIS — Z1211 Encounter for screening for malignant neoplasm of colon: Secondary | ICD-10-CM

## 2023-04-24 DIAGNOSIS — Z0000 Encounter for general adult medical examination without abnormal findings: Secondary | ICD-10-CM | POA: Diagnosis not present

## 2023-04-24 DIAGNOSIS — K219 Gastro-esophageal reflux disease without esophagitis: Secondary | ICD-10-CM

## 2023-04-24 DIAGNOSIS — E559 Vitamin D deficiency, unspecified: Secondary | ICD-10-CM | POA: Diagnosis not present

## 2023-04-24 DIAGNOSIS — Z79899 Other long term (current) drug therapy: Secondary | ICD-10-CM

## 2023-04-24 DIAGNOSIS — Z136 Encounter for screening for cardiovascular disorders: Secondary | ICD-10-CM

## 2023-04-24 DIAGNOSIS — Z0001 Encounter for general adult medical examination with abnormal findings: Secondary | ICD-10-CM

## 2023-04-24 DIAGNOSIS — J40 Bronchitis, not specified as acute or chronic: Secondary | ICD-10-CM

## 2023-04-24 DIAGNOSIS — E785 Hyperlipidemia, unspecified: Secondary | ICD-10-CM | POA: Diagnosis not present

## 2023-04-24 DIAGNOSIS — N182 Chronic kidney disease, stage 2 (mild): Secondary | ICD-10-CM | POA: Diagnosis not present

## 2023-04-24 DIAGNOSIS — E1169 Type 2 diabetes mellitus with other specified complication: Secondary | ICD-10-CM | POA: Diagnosis not present

## 2023-04-24 DIAGNOSIS — E1122 Type 2 diabetes mellitus with diabetic chronic kidney disease: Secondary | ICD-10-CM | POA: Diagnosis not present

## 2023-04-24 DIAGNOSIS — I10 Essential (primary) hypertension: Secondary | ICD-10-CM | POA: Diagnosis not present

## 2023-04-24 DIAGNOSIS — M8589 Other specified disorders of bone density and structure, multiple sites: Secondary | ICD-10-CM

## 2023-04-24 DIAGNOSIS — F3341 Major depressive disorder, recurrent, in partial remission: Secondary | ICD-10-CM | POA: Diagnosis not present

## 2023-04-24 DIAGNOSIS — G4733 Obstructive sleep apnea (adult) (pediatric): Secondary | ICD-10-CM

## 2023-04-24 MED ORDER — AZITHROMYCIN 250 MG PO TABS: ORAL_TABLET | ORAL | 1 refills | Status: DC

## 2023-04-24 MED ORDER — DEXAMETHASONE 4 MG PO TABS: ORAL_TABLET | ORAL | 0 refills | Status: DC

## 2023-04-24 MED ORDER — PROMETHAZINE-DM 6.25-15 MG/5ML PO SYRP: ORAL_SOLUTION | ORAL | 1 refills | Status: DC

## 2023-04-24 NOTE — Patient Instructions (Addendum)
Due to recent changes in healthcare laws, you may see the results of your imaging and laboratory studies on MyChart before your provider has had a chance to review them.  We understand that in some cases there may be results that are confusing or concerning to you. Not all laboratory results come back in the same time frame and the provider may be waiting for multiple results in order to interpret others.  Please give [place] 48 hours in order for your provider to thoroughly review all the results before contacting the office for clarification of your results.   +++++++++++++++++++++++++  Vit D  & Vit C 1,000 mg   are recommended to help protect  against the Covid-19 and other Corona viruses.    Also it's recommended  to take  Zinc 50 mg x 1/2 tablet = 25 mg  /day  to help  protect against the Covid-19   and best place to get  is also on [email or web address]  and don't pay more than 6-8 cents /pill !  ================================ Coronavirus (COVID-19) Are you at risk?  Are you at risk for the Coronavirus (COVID-19)?  To be considered HIGH RISK for Coronavirus (COVID-19), you have to meet the following criteria:  Traveled to [place], [place], [place], [place] or [place]; or in the [place] to [place], [place], [place]  or [place]; and have fever, cough, and shortness of breath within the last 2 weeks of travel OR Been in close contact with a person diagnosed with COVID-19 within the last 2 weeks and have  fever, cough,and shortness of breath  IF YOU DO NOT MEET THESE CRITERIA, YOU ARE CONSIDERED LOW RISK FOR COVID-19.  What to do if you are HIGH RISK for COVID-19?  If you are having a medical emergency, call 911. Seek medical care right away. Before you go to a doctor's office, urgent care or emergency department,  call ahead and tell them about your recent travel, contact with someone diagnosed with COVID-19   and your symptoms.  You should receive instructions from your  physician's office regarding next steps of care.  When you arrive at healthcare provider, tell the healthcare staff immediately you have returned from  visiting [place], [place], [place], [place] or [place]; or traveled in the [place] to [place], [place],  [place] or [place] in the last two weeks or you have been in close contact with a person diagnosed with  COVID-19 in the last 2 weeks.   Tell the health care staff about your symptoms: fever, cough and shortness of breath. After you have been seen by a medical provider, you will be either: Tested for (COVID-19) and discharged home on quarantine except to seek medical care if  symptoms worsen, and asked to  Stay home and avoid contact with others until you get your results (4-5 days)  Avoid travel on public transportation if possible (such as bus, train, or airplane) or Sent to the Emergency Department by EMS for evaluation, COVID-19 testing  and  possible admission depending on your condition and test results.  What to do if you are LOW RISK for COVID-19?  Reduce your risk of any infection by using the same precautions used for avoiding the common cold or flu:  Wash your hands often with soap and warm water for at least 20 seconds.  If soap and water are not readily available,  use an alcohol-based hand sanitizer with at least 60% alcohol.  If coughing or  sneezing, cover your mouth and nose by coughing or sneezing into the elbow areas of your shirt or coat,  into a tissue or into your sleeve (not your hands). Avoid shaking hands with others and consider head nods or verbal greetings only. Avoid touching your eyes, nose, or mouth with unwashed hands.  Avoid close contact with people who are sick. Avoid places or events with large numbers of people in one location, like concerts or sporting events. Carefully consider travel plans you have or are making. If you are planning any travel outside or inside the [place], visit the CDC's  Travelers' Health webpage for the latest health notices. If you have some symptoms but not all symptoms, continue to monitor at home and seek medical attention  if your symptoms worsen. If you are having a medical emergency, call 911.   >>>>>>>>>>>>>>>>>>>>>>>>>>>>>>>>>  We Do NOT Approve of [employer] > > > > > > > > > > > > > > > > > > > > > > > > > > > > > > > > > > > > > > >  Preventive Care for Adults  A healthy lifestyle and preventive care can promote health and wellness. Preventive health guidelines for women include the following key practices. A routine yearly physical is a good way to check with your health care provider about your health and preventive screening. It is a chance to share any concerns and updates on your health and to receive a thorough exam. Visit your dentist for a routine exam and preventive care every 6 months. Brush your teeth twice a day and floss once a day. Good oral hygiene prevents tooth decay and gum disease. The frequency of eye exams is based on your age, health, family medical history, use of contact lenses, and other factors. Follow your health care provider's recommendations for frequency of eye exams. Eat a healthy diet. Foods like vegetables, fruits, whole grains, low-fat dairy products, and lean protein foods contain the nutrients you need without too many calories. Decrease your intake of foods high in solid fats, added sugars, and salt. Eat the right amount of calories for you. Get information about a proper diet from your health care provider, if necessary. Regular physical exercise is one of the most important things you can do for your health. Most adults should get at least 150 minutes of moderate-intensity exercise (any activity that increases your heart rate and causes you to sweat) each week. In addition, most adults need muscle-strengthening exercises on 2 or more days a week. Maintain a healthy weight. The body mass index (BMI) is a  screening tool to identify possible weight problems. It provides an estimate of body fat based on height and weight. Your health care provider can find your BMI and can help you achieve or maintain a healthy weight. For adults 20 years and older: A BMI below 18.5 is considered underweight. A BMI of 18.5 to 24.9 is normal. A BMI of 25 to 29.9 is considered overweight. A BMI of 30 and above is considered obese. Maintain normal blood lipids and cholesterol levels by exercising and minimizing your intake of saturated fat. Eat a balanced diet with plenty of fruit and vegetables. If your lipid or cholesterol levels are high, you are over 50, or you are at high risk for heart disease, you may need your cholesterol levels checked more frequently. Ongoing high lipid and cholesterol levels should be treated with medicines if diet and exercise are  not working. If you smoke, find out from your health care provider how to quit. If you do not use tobacco, do not start. Lung cancer screening is recommended for adults aged 69-80 years who are at high risk for developing lung cancer because of a history of smoking. A yearly low-dose CT scan of the lungs is recommended for people who have at least a 30-pack-year history of smoking and are a current smoker or have quit within the past 15 years. A pack year of smoking is smoking an average of 1 pack of cigarettes a day for 1 year (for example: 1 pack a day for 30 years or 2 packs a day for 15 years). Yearly screening should continue until the smoker has stopped smoking for at least 15 years. Yearly screening should be stopped for people who develop a health problem that would prevent them from having lung cancer treatment. Avoid use of street drugs. Do not share needles with anyone. Ask for help if you need support or instructions about stopping the use of drugs. High blood pressure causes heart disease and increases the risk of stroke.  Ongoing high blood pressure should be  treated with medicines if weight loss and exercise do not work. If you are 41-54 years old, ask your health care provider if you should take aspirin to prevent strokes. Diabetes screening involves taking a blood sample to check your fasting blood sugar level. This should be done once every 3 years, after age 8, if you are within normal weight and without risk factors for diabetes. Testing should be considered at a younger age or be carried out more frequently if you are overweight and have at least 1 risk factor for diabetes. Breast cancer screening is essential preventive care for women. You should practice "breast self-awareness." This means understanding the normal appearance and feel of your breasts and may include breast self-examination. Any changes detected, no matter how small, should be reported to a health care provider. Women in their 15s and 30s should have a clinical breast exam (CBE) by a health care provider as part of a regular health exam every 1 to 3 years. After age 80, women should have a CBE every year. Starting at age 33, women should consider having a mammogram (breast X-ray test) every year. Women who have a family history of breast cancer should talk to their health care provider about genetic screening. Women at a high risk of breast cancer should talk to their health care providers about having an MRI and a mammogram every year. Breast cancer gene (BRCA)-related cancer risk assessment is recommended for women who have family members with BRCA-related cancers. BRCA-related cancers include breast, ovarian, tubal, and peritoneal cancers. Having family members with these cancers may be associated with an increased risk for harmful changes (mutations) in the breast cancer genes BRCA1 and BRCA2. Results of the assessment will determine the need for genetic counseling and BRCA1 and BRCA2 testing. Routine pelvic exams to screen for cancer are no longer recommended for nonpregnant women who  are considered low risk for cancer of the pelvic organs (ovaries, uterus, and vagina) and who do not have symptoms. Ask your health care provider if a screening pelvic exam is right for you. If you have had past treatment for cervical cancer or a condition that could lead to cancer, you need Pap tests and screening for cancer for at least 20 years after your treatment. If Pap tests have been discontinued, your risk factors (such as  having a new sexual partner) need to be reassessed to determine if screening should be resumed. Some women have medical problems that increase the chance of getting cervical cancer. In these cases, your health care provider may recommend more frequent screening and Pap tests.  Colorectal cancer can be detected and often prevented. Most routine colorectal cancer screening begins at the age of 71 years and continues through age 40 years. However, your health care provider may recommend screening at an earlier age if you have risk factors for colon cancer. On a yearly basis, your health care provider may provide home test kits to check for hidden blood in the stool. Use of a small camera at the end of a tube, to directly examine the colon (sigmoidoscopy or colonoscopy), can detect the earliest forms of colorectal cancer. Talk to your health care provider about this at age 77, when routine screening begins.  Direct exam of the colon should be repeated every 5-10 years through age 44 years, unless early forms of pre-cancerous polyps or small growths are found. Osteoporosis is a disease in which the bones lose minerals and strength with aging. This can result in serious bone fractures or breaks. The risk of osteoporosis can be identified using a bone density scan. Women ages 43 years and over and women at risk for fractures or osteoporosis should discuss screening with their health care providers. Ask your health care provider whether you should take a calcium supplement or vitamin D to  reduce the rate of osteoporosis. Menopause can be associated with physical symptoms and risks. Hormone replacement therapy is available to decrease symptoms and risks. You should talk to your health care provider about whether hormone replacement therapy is right for you. Use sunscreen. Apply sunscreen liberally and repeatedly throughout the day. You should seek shade when your shadow is shorter than you. Protect yourself by wearing long sleeves, pants, a wide-brimmed hat, and sunglasses year round, whenever you are outdoors. Once a month, do a whole body skin exam, using a mirror to look at the skin on your back. Tell your health care provider of new moles, moles that have irregular borders, moles that are larger than a pencil eraser, or moles that have changed in shape or color. Stay current with required vaccines (immunizations). Influenza vaccine. All adults should be immunized every year. Tetanus, diphtheria, and acellular pertussis (Td, Tdap) vaccine. Pregnant women should receive 1 dose of Tdap vaccine during each pregnancy. The dose should be obtained regardless of the length of time since the last dose. Immunization is preferred during the 27th-36th week of gestation. An adult who has not previously received Tdap or who does not know her vaccine status should receive 1 dose of Tdap. This initial dose should be followed by tetanus and diphtheria toxoids (Td) booster doses every 10 years. Adults with an unknown or incomplete history of completing a 3-dose immunization series with Td-containing vaccines should begin or complete a primary immunization series including a Tdap dose. Adults should receive a Td booster every 10 years.  Zoster vaccine. One dose is recommended for adults aged 67 years or older unless certain conditions are present.  Pneumococcal 13-valent conjugate (PCV13) vaccine. When indicated, a person who is uncertain of her immunization history and has no record of immunization should  receive the PCV13 vaccine. An adult aged 79 years or older who has certain medical conditions and has not been previously immunized should receive 1 dose of PCV13 vaccine. This PCV13 should be followed with a  dose of pneumococcal polysaccharide (PPSV23) vaccine. The PPSV23 vaccine dose should be obtained at least 1 or more year(s) after the dose of PCV13 vaccine. An adult aged 32 years or older who has certain medical conditions and previously received 1 or more doses of PPSV23 vaccine should receive 1 dose of PCV13. The PCV13 vaccine dose should be obtained 1 or more years after the last PPSV23 vaccine dose.  Pneumococcal polysaccharide (PPSV23) vaccine. When PCV13 is also indicated, PCV13 should be obtained first. All adults aged 15 years and older should be immunized. An adult younger than age 46 years who has certain medical conditions should be immunized. Any person who resides in a nursing home or long-term care facility should be immunized. An adult smoker should be immunized. People with an immunocompromised condition and certain other conditions should receive both PCV13 and PPSV23 vaccines. People with human immunodeficiency virus (HIV) infection should be immunized as soon as possible after diagnosis. Immunization during chemotherapy or radiation therapy should be avoided. Routine use of PPSV23 vaccine is not recommended for American Indians, [place] Natives, or people younger than 65 years unless there are medical conditions that require PPSV23 vaccine. When indicated, people who have unknown immunization and have no record of immunization should receive PPSV23 vaccine. One-time revaccination 5 years after the first dose of PPSV23 is recommended for people aged 19-64 years who have chronic kidney failure, nephrotic syndrome, asplenia, or immunocompromised conditions. People who received 1-2 doses of PPSV23 before age 50 years should receive another dose of PPSV23 vaccine at age 36 years or later if at  least 5 years have passed since the previous dose. Doses of PPSV23 are not needed for people immunized with PPSV23 at or after age 33 years.  Preventive Services / Frequency  Ages 32 years and over Blood pressure check. Lipid and cholesterol check. Lung cancer screening. / Every year if you are aged 6-80 years and have a 30-pack-year history of smoking and currently smoke or have quit within the past 15 years. Yearly screening is stopped once you have quit smoking for at least 15 years or develop a health problem that would prevent you from having lung cancer treatment. Clinical breast exam.** / Every year after age 30 years.  BRCA-related cancer risk assessment.** / For women who have family members with a BRCA-related cancer (breast, ovarian, tubal, or peritoneal cancers). Mammogram.** / Every year beginning at age 63 years and continuing for as long as you are in good health. Consult with your health care provider. Pap test.** / Every 3 years starting at age 41 years through age 34 or 84 years with 3 consecutive normal Pap tests. Testing can be stopped between 65 and 70 years with 3 consecutive normal Pap tests and no abnormal Pap or HPV tests in the past 10 years. Fecal occult blood test (FOBT) of stool. / Every year beginning at age 29 years and continuing until age 47 years. You may not need to do this test if you get a colonoscopy every 10 years. Flexible sigmoidoscopy or colonoscopy.** / Every 5 years for a flexible sigmoidoscopy or every 10 years for a colonoscopy beginning at age 60 years and continuing until age 36 years. Hepatitis C blood test.** / For all people born from [birth year] through 1965 and any individual with known risks for hepatitis C. Osteoporosis screening.** / A one-time screening for women ages 70 years and over and women at risk for fractures or osteoporosis. Skin self-exam. / Monthly. Influenza vaccine. / Every  year. Tetanus, diphtheria, and acellular pertussis (Tdap/Td)  vaccine.** / 1 dose of Td every 10 years. Zoster vaccine.** / 1 dose for adults aged 25 years or older. Pneumococcal 13-valent conjugate (PCV13) vaccine.** / Consult your health care provider. Pneumococcal polysaccharide (PPSV23) vaccine.** / 1 dose for all adults aged 43 years and older. Screening for abdominal aortic aneurysm (AAA)  by ultrasound is recommended for people who have history of high blood pressure or who are current or former smokers. ++++++++++++++++++++ Recommend Adult Low Dose Aspirin or  coated  Aspirin 81 mg daily  To reduce risk of Colon Cancer 40 %,  Skin Cancer 26 % ,  Melanoma 46%  and  Pancreatic cancer 60% ++++++++++++++++++++ Vitamin D goal  is between 70-100.  Please make sure that you are taking your Vitamin D as directed.  It is very important as a natural anti-inflammatory  helping hair, skin, and nails, as well as reducing stroke and heart attack risk.  It helps your bones and helps with mood. It also decreases numerous cancer risks so please take it as directed.  Low Vit D is associated with a 200-300% higher risk for CANCER  and 200-300% higher risk for HEART   ATTACK  &  STROKE.   .....................................Marland Kitchen It is also associated with higher death rate at younger ages,  autoimmune diseases like Rheumatoid arthritis, Lupus, Multiple Sclerosis.    Also many other serious conditions, like depression, Alzheimer's Dementia, infertility, muscle aches, fatigue, fibromyalgia - just to name a few. ++++++++++++++++++ Recommend the book "The END of DIETING" by Dr Excell Seltzer  & the book "The END of DIABETES " by Dr Excell Seltzer At [email or web address] - get book & Audio CD's    Being diabetic has a  300% increased risk for heart attack, stroke, cancer, and alzheimer- type vascular dementia. It is very important that you work harder with diet by avoiding all foods that are white. Avoid white rice (brown & wild rice is OK), white potatoes (sweetpotatoes in  moderation is OK), White bread or wheat bread or anything made out of white flour like bagels, donuts, rolls, buns, biscuits, cakes, pastries, cookies, pizza crust, and pasta (made from white flour & egg whites) - vegetarian pasta or spinach or wheat pasta is OK. Multigrain breads like Arnold's or [employer], or multigrain sandwich thins or flatbreads.  Diet, exercise and weight loss can reverse and cure diabetes in the early stages.  Diet, exercise and weight loss is very important in the control and prevention of complications of diabetes which affects every system in your body, ie. Brain - dementia/stroke, eyes - glaucoma/blindness, heart - heart attack/heart failure, kidneys - dialysis, stomach - gastric paralysis, intestines - malabsorption, nerves - severe painful neuritis, circulation - gangrene & loss of a leg(s), and finally cancer and Alzheimers.    I recommend avoid fried & greasy foods,  sweets/candy, white rice (brown or wild rice or Quinoa is OK), white potatoes (sweet potatoes are OK) - anything made from white flour - bagels, doughnuts, rolls, buns, biscuits,white and wheat breads, pizza crust and traditional pasta made of white flour & egg white(vegetarian pasta or spinach or wheat pasta is OK).  Multi-grain bread is OK - like multi-grain flat bread or sandwich thins. Avoid alcohol in excess. Exercise is also important.    Eat all the vegetables you want - avoid meat, especially red meat and dairy - especially cheese.  Cheese is the most concentrated form of trans-fats which is the worst thing  to clog up our arteries. Veggie cheese is OK which can be found in the fresh produce section at [employer] or [employer] or [employer]  +++++++++++++++++++ DASH Eating Plan  DASH stands for "Dietary Approaches to Stop Hypertension."   The DASH eating plan is a healthy eating plan that has been shown to reduce high blood pressure (hypertension). Additional health benefits may include reducing  the risk of type 2 diabetes mellitus, heart disease, and stroke. The DASH eating plan may also help with weight loss. WHAT DO I NEED TO KNOW ABOUT THE DASH EATING PLAN? For the DASH eating plan, you will follow these general guidelines: Choose foods with a percent daily value for sodium of less than 5% (as listed on the food label). Use salt-free seasonings or herbs instead of table salt or sea salt. Check with your health care provider or pharmacist before using salt substitutes. Eat lower-sodium products, often labeled as "lower sodium" or "no salt added." Eat fresh foods. Eat more vegetables, fruits, and low-fat dairy products. Choose whole grains. Look for the word "whole" as the first word in the ingredient list. Choose fish  Limit sweets, desserts, sugars, and sugary drinks. Choose heart-healthy fats. Eat veggie cheese  Eat more home-cooked food and less restaurant, buffet, and fast food. Limit fried foods. Cook foods using methods other than frying. Limit canned vegetables. If you do use them, rinse them well to decrease the sodium. When eating at a restaurant, ask that your food be prepared with less salt, or no salt if possible.                      WHAT FOODS CAN I EAT? Read Dr Fara Olden Fuhrman's books on The End of Dieting & The End of Diabetes  Grains Whole grain or whole wheat bread. Brown rice. Whole grain or whole wheat pasta. Quinoa, bulgur, and whole grain cereals. Low-sodium cereals. Corn or whole wheat flour tortillas. Whole grain cornbread. Whole grain crackers. Low-sodium crackers.  Vegetables Fresh or frozen vegetables (raw, steamed, roasted, or grilled). Low-sodium or reduced-sodium tomato and vegetable juices. Low-sodium or reduced-sodium tomato sauce and paste. Low-sodium or reduced-sodium canned vegetables.   Fruits All fresh, canned (in natural juice), or frozen fruits.  Protein Products  All fish and seafood.  Dried beans, peas, or lentils. Unsalted nuts and  seeds. Unsalted canned beans.  Dairy Low-fat dairy products, such as skim or 1% milk, 2% or reduced-fat cheeses, low-fat ricotta or cottage cheese, or plain low-fat yogurt. Low-sodium or reduced-sodium cheeses.  Fats and Oils Tub margarines without trans fats. Light or reduced-fat mayonnaise and salad dressings (reduced sodium). Avocado. Safflower, olive, or canola oils. Natural peanut or almond butter.  Other Unsalted popcorn and pretzels. The items listed above may not be a complete list of recommended foods or beverages. Contact your dietitian for more options.  +++++++++++++++  WHAT FOODS ARE NOT RECOMMENDED? Grains/ White flour or wheat flour White bread. White pasta. White rice. Refined cornbread. Bagels and croissants. Crackers that contain trans fat.  Vegetables  Creamed or fried vegetables. Vegetables in a . Regular canned vegetables. Regular canned tomato sauce and paste. Regular tomato and vegetable juices.  Fruits Dried fruits. Canned fruit in light or heavy syrup. Fruit juice.  Meat and Other Protein Products Meat in general - RED meat & White meat.  Fatty cuts of meat. Ribs, chicken wings, all processed meats as bacon, sausage, bologna, salami, fatback, hot dogs, bratwurst and packaged luncheon meats.  Dairy  Whole or 2% milk, cream, half-and-half, and cream cheese. Whole-fat or sweetened yogurt. Full-fat cheeses or blue cheese. Non-dairy creamers and whipped toppings. Processed cheese, cheese spreads, or cheese curds.  Condiments Onion and garlic salt, seasoned salt, table salt, and sea salt. Canned and packaged gravies. Worcestershire sauce. Tartar sauce. Barbecue sauce. Teriyaki sauce. Soy sauce, including reduced sodium. Steak sauce. Fish sauce. Oyster sauce. Cocktail sauce. Horseradish. Ketchup and mustard. Meat flavorings and tenderizers. Bouillon cubes. Hot sauce. Tabasco sauce. Marinades. Taco seasonings. Relishes.  Fats and Oils Butter, stick margarine, lard,  shortening and bacon fat. Coconut, palm kernel, or palm oils. Regular salad dressings.  Pickles and olives. Salted popcorn and pretzels.  The items listed above may not be a complete list of foods and beverages to avoid.

## 2023-04-24 NOTE — Progress Notes (Signed)
[name]  Michele [name], M.D.        Michele [name], D.NP      Michele [name], D.NP  [facility] [address]  Michele [name], [name]. [address] Telephone [phone] Telefax [phone]  Annual Screening/Preventative Visit & Comprehensive Evaluation &  Examination  Future Appointments  Date Time Provider Department  04/24/2023                             cpe  2:00 PM Michele Cowboy, MD [facility]  01/27/2024 11:00 AM Michele Penny, NP [facility]  05/13/2024                             cpe  2:00 PM Michele Cowboy, MD [facility]        [name]  presents for a Screening /Preventative Visit & comprehensive evaluation and management of multiple medical co-morbidities.  Patient has been followed for HTN, HLD, T2_NIDDM  and Vitamin D Deficiency.  Patient has hx/o OSA, but was Mask Intolerant. Patient has hx/o Depression in partial remission on her meds.          HTN predates since 40. Patient has long hx/o very labile elevated BP's. Patient denies any cardiac symptoms as chest pain, palpitations, shortness of breath, dizziness or ankle swelling. Today's BP was initially slightly elevated & rechecked at goal - 128/70 .        Patient's hyperlipidemia is controlled with diet and medications. Patient denies myalgias or other medication SE's. 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 Last A1c was near goal :  Lab Results  Component Value Date   HGBA1C 5.9 (H) 01/14/2022         Finally, patient has history of Vitamin D Deficiency ("19"/2008)  and last Vitamin D was not at  goal (70-100) :  Lab Results  Component Value Date   VD25OH 47 01/14/2022       Current Outpatient Medications  Medication Instructions   acetaminophen   500 mg 3 times daily PRN   Aspirin 325 mg Daily   Atenolol 50 MG tablet TAKE 1 TO 2 TABLETS  IN THE MORNING    VITAMIN D 10,000 Unit 2 times a week no W and F   cloNIDine   0.2 MG tablet Take 1 tablet   3 x /day    VITAMIN B-12 5000 mc  1 tablet Daily   meloxicam   15 mg , Oral, Daily PRN   metFORMIN  500 MG tablet TAKE TWO TABLETS  MORNING and EVENING    minoxidil  10 MG tablet Take  1 tablet  Daily   Multiple Minerals  Take daily   olmesartan 40 MG tablet Take 1 tablet every Evening    DITROPAN-XL) 10 MG 24 hr tablet Take  1 tablet  Daily   rosuvastatin 20 MG tablet Take  1 tablet  Daily    sertraline  100 MG tablet Take  1 tablet  Daily  verapamil  240 MG Take  1 tablet  Daily      Allergies  Allergen Reactions   Lipitor [Atorvastatin]     Elevates LFT's     Past Medical History:  Diagnosis Date   Anemia    Anxiety    Arthritis    Carpal tunnel syndrome    Cerebrovascular disease 01/02/2017   Duodenal diverticulum    Elevated uric acid in blood    Esophageal motility disorder    Fatty liver    GERD (gastroesophageal reflux disease)    Hyperlipidemia    Hypertension    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   Sleep apnea    Tubulovillous adenoma of colon    Type II  diabetes mellitus       Health Maintenance  Topic Date Due   Zoster Vaccines- Shingrix (1 of 2) Never done   COVID-19 Vaccine (4 - Booster for Pfizer series) 05/03/2020   INFLUENZA VACCINE  01/01/2021   FOOT EXAM  02/26/2021   HEMOGLOBIN A1C  06/30/2021   OPHTHALMOLOGY EXAM  11/29/2021   TETANUS/TDAP  03/26/2023   Pneumonia Vaccine  Completed   DEXA SCAN  Completed   Hepatitis C Screening  Completed   HPV VACCINES  Aged Out     Immunization History  Administered Date(s) Administered   Influenza Split 03/25/2013   Influenza, High Dose   04/21/2017, 02/18/2018, 03/11/2019, 03/20/2020   PFIZER  SARS-COV-2 Vacc 06/24/2019, 07/15/2019, 03/08/2020   Pneumococcal-13 01/26/2014   Pneumococcal-23 03/14/2011   Td 03/25/2013   Zoster, Live 06/03/2005    Colonoscopy - 09/22/2012 - Dr [name] - Dx Lymphocytic colitis  Colonoscopy  - 02/10/2018- Dr Marina Goodell   Last Colonoscopy - Dr Marina Goodell - 03/27/2023 - Neg - Recc No F/U  due to age   Last MGM - 02/14/2023  Last dBMD - 12/13/2020  Past Surgical History:  Procedure Laterality Date   APPENDECTOMY     BREAST BIOPSY Right    CARPAL TUNNEL RELEASE Right 1992   cataract extration     COLONOSCOPY     CYST REMOVAL HAND     TONSILLECTOMY  1952     Family History  Problem Relation Age of Onset   Heart disease Mother    Diabetes Mother    Colon polyps Mother    Heart disease Father    Diabetes Father    Heart attack Father    Heart disease Brother    Heart attack Brother    Cerebral aneurysm Sister    Heart disease Brother        Stent   Colon cancer Neg Hx    Esophageal cancer Neg Hx    Stomach cancer Neg Hx    Rectal cancer Neg Hx    Breast cancer Neg Hx      Social History   Tobacco Use   Smoking status: Never   Smokeless tobacco: Never  Vaping Use   Vaping Use: Never used  Substance Use Topics   Alcohol use: No   Drug use: No      ROS Constitutional: Denies fever, chills, weight loss/gain, headaches, insomnia,  night sweats, and change in appetite. Does c/o fatigue. Eyes: Denies redness, blurred vision, diplopia, discharge, itchy, watery eyes.  ENT: Denies discharge, congestion, post nasal drip, epistaxis, sore throat, earache, hearing loss, dental pain, Tinnitus, Vertigo, Sinus pain, snoring.  Cardio: Denies chest pain, palpitations, irregular heartbeat, syncope, dyspnea, diaphoresis, orthopnea, PND, claudication, edema Respiratory: denies cough, dyspnea, DOE, pleurisy, hoarseness, laryngitis, wheezing.  Gastrointestinal: Denies dysphagia,  heartburn, reflux, water brash, pain, cramps, nausea, vomiting, bloating, diarrhea, constipation, hematemesis, melena, hematochezia, jaundice, hemorrhoids Genitourinary: Denies dysuria, frequency, urgency, nocturia, hesitancy, discharge, hematuria, flank pain Breast: Breast lumps, nipple discharge, bleeding.  Musculoskeletal: Denies arthralgia, myalgia, stiffness, Jt. Swelling, pain, limp, and strain/sprain. Denies falls. Skin: Denies puritis, rash, hives, warts, acne, eczema, changing in skin lesion Neuro: No weakness, tremor, incoordination, spasms, paresthesia, pain Psychiatric: Denies confusion, memory loss, sensory loss. Denies Depression. Endocrine: Denies change in weight, skin, hair change, nocturia, and paresthesia, diabetic polys, visual blurring, hyper / hypo glycemic episodes.  Heme/Lymph: No excessive bleeding, bruising, enlarged lymph nodes.  Physical Exam  BP 128/70   Pulse 63   Temp 98.1 F (36.7 C)   Resp 16   Ht 5\' 3"  (1.6 m)   Wt 147 lb 12.8 oz (67 kg)   SpO2 97%   BMI 26.18 kg/m   General Appearance: Well nourished, well groomed and in no apparent distress.  Eyes: PERRLA, EOMs, conjunctiva no swelling or erythema, normal fundi and vessels. Sinuses: No frontal/maxillary tenderness ENT/Mouth: EACs patent / TMs  nl. Nares clear without erythema, swelling, mucoid exudates. Oral hygiene is good. No erythema, swelling, or exudate. Tongue normal, non-obstructing. Tonsils not swollen or erythematous. Hearing normal.  Neck: Supple, thyroid not palpable. No bruits, nodes or JVD. Respiratory: Respiratory effort normal.  BS equal and clear bilateral without rales, rhonci, wheezing or stridor. Cardio: Heart sounds are normal with regular rate and rhythm and no murmurs, rubs or gallops. Peripheral pulses are normal and equal bilaterally without edema. No aortic or femoral bruits. Chest: symmetric with normal excursions and percussion. Breasts: Symmetric, without lumps, nipple  discharge, retractions, or fibrocystic changes.  Abdomen: Flat, soft with bowel sounds active. Nontender, no guarding, rebound, hernias, masses, or organomegaly.  Lymphatics: Non tender without lymphadenopathy.  Genitourinary:  Musculoskeletal: Full ROM all peripheral extremities, joint stability, 5/5 strength, and normal gait. Skin: Warm and dry without rashes, lesions, cyanosis, clubbing or  ecchymosis.  Neuro: Cranial nerves intact, reflexes equal bilaterally. Normal muscle tone, no cerebellar symptoms. Sensation intact.  Pysch: Alert and oriented X 3, normal affect, Insight and Judgment appropriate.    Assessment and Plan  1. Annual Preventative Screening Examination   2. Essential hypertension  - EKG 12-Lead - Urinalysis, Routine w reflex microscopic - Microalbumin / creatinine urine ratio - CBC with Differential/Platelet - COMPLETE METABOLIC PANEL WITH GFR - Magnesium - TSH  3. Hyperlipidemia associated with type 2 diabetes mellitus (HCC)  - EKG 12-Lead - Lipid panel - TSH  4. Type 2 diabetes mellitus with stage 2 chronic kidney  disease, without long-term current use of insulin (HCC)  - EKG 12-Lead - HM DIABETES FOOT EXAM - LOW EXTREMITY NEUR EXAM DOCUM - Hemoglobin A1c - Insulin, random  5. Vitamin D deficiency  - VITAMIN D 25 Hydroxy   6. Idiopathic gout  - Uric acid  7. Depression, major, recurrent, in partial remission (HCC)  - TSH  8. Gastroesophageal reflux disease  - CBC with Differential/Platelet  9. Screening for colorectal cancer  - POC Hemoccult Bld/Stl  10. Sleep apnea, unspecified type   11. Screening for ischemic heart disease  - EKG 12-Lead  12. Medication management  - Urinalysis, Routine w reflex microscopic - Microalbumin / creatinine urine ratio - CBC with Differential/Platelet - COMPLETE METABOLIC PANEL WITH GFR - Magnesium - Lipid panel - TSH - Hemoglobin A1c - Insulin, random - VITAMIN D 25 Hydroxy  13.  Osteopenia   -  dexaBMD        Patient was counseled in prudent diet to achieve/maintain BMI less than 25 for weight control, BP monitoring, regular exercise and medications. Discussed med's effects and SE's. Screening labs and tests as requested with regular follow-up as recommended. Over 40 minutes of exam, counseling, chart review and high complex critical decision making was performed.   Marinus Maw, MD

## 2023-04-25 LAB — URINALYSIS, ROUTINE W REFLEX MICROSCOPIC
Bilirubin Urine: NEGATIVE
Glucose, UA: NEGATIVE
Hgb urine dipstick: NEGATIVE
Ketones, ur: NEGATIVE
Nitrite: NEGATIVE
Protein, ur: NEGATIVE
RBC / HPF: NONE SEEN /[HPF] (ref 0–2)
Specific Gravity, Urine: 1.019 (ref 1.001–1.035)
Squamous Epithelial / HPF: NONE SEEN /[HPF] (ref <=5)
pH: 5.5 (ref 5.0–8.0)

## 2023-04-25 LAB — CBC WITH DIFFERENTIAL/PLATELET
Absolute Lymphocytes: 2580 {cells}/uL (ref 850–3900)
Absolute Monocytes: 679 {cells}/uL (ref 200–950)
Basophils Absolute: 34 {cells}/uL (ref 0–200)
Basophils Relative: 0.4 %
Eosinophils Absolute: 215 {cells}/uL (ref 15–500)
Eosinophils Relative: 2.5 %
HCT: 37.7 % (ref 35.0–45.0)
Hemoglobin: 12.1 g/dL (ref 11.7–15.5)
MCH: 25.6 pg — ABNORMAL LOW (ref 27.0–33.0)
MCHC: 32.1 g/dL (ref 32.0–36.0)
MCV: 79.9 fL — ABNORMAL LOW (ref 80.0–100.0)
MPV: 9.7 fL (ref 7.5–12.5)
Monocytes Relative: 7.9 %
Neutro Abs: 5091 {cells}/uL (ref 1500–7800)
Neutrophils Relative %: 59.2 %
Platelets: 221 10*3/uL (ref 140–400)
RBC: 4.72 10*6/uL (ref 3.80–5.10)
RDW: 15.2 % — ABNORMAL HIGH (ref 11.0–15.0)
Total Lymphocyte: 30 %
WBC: 8.6 10*3/uL (ref 3.8–10.8)

## 2023-04-25 LAB — COMPLETE METABOLIC PANEL WITH GFR
AG Ratio: 2 (calc) (ref 1.0–2.5)
ALT: 30 U/L — ABNORMAL HIGH (ref 6–29)
AST: 39 U/L — ABNORMAL HIGH (ref 10–35)
Albumin: 4.8 g/dL (ref 3.6–5.1)
Alkaline phosphatase (APISO): 79 U/L (ref 37–153)
BUN/Creatinine Ratio: 17 (calc) (ref 6–22)
BUN: 19 mg/dL (ref 7–25)
CO2: 26 mmol/L (ref 20–32)
Calcium: 10.2 mg/dL (ref 8.6–10.4)
Chloride: 103 mmol/L (ref 98–110)
Creat: 1.09 mg/dL — ABNORMAL HIGH (ref 0.60–1.00)
Globulin: 2.4 g/dL (ref 1.9–3.7)
Glucose, Bld: 90 mg/dL (ref 65–99)
Potassium: 4.4 mmol/L (ref 3.5–5.3)
Sodium: 140 mmol/L (ref 135–146)
Total Bilirubin: 0.7 mg/dL (ref 0.2–1.2)
Total Protein: 7.2 g/dL (ref 6.1–8.1)
eGFR: 52 mL/min/{1.73_m2} — ABNORMAL LOW (ref >=60)

## 2023-04-25 LAB — TSH: TSH: 0.71 m[IU]/L (ref 0.40–4.50)

## 2023-04-25 LAB — MICROALBUMIN / CREATININE URINE RATIO
Creatinine, Urine: 129 mg/dL (ref 20–275)
Microalb Creat Ratio: 17 mg/g{creat} (ref <30)
Microalb, Ur: 2.2 mg/dL

## 2023-04-25 LAB — MAGNESIUM: Magnesium: 1.9 mg/dL (ref 1.5–2.5)

## 2023-04-25 LAB — MICROSCOPIC MESSAGE

## 2023-04-26 ENCOUNTER — Other Ambulatory Visit: Payer: Self-pay | Admitting: Internal Medicine

## 2023-04-26 DIAGNOSIS — N390 Urinary tract infection, site not specified: Secondary | ICD-10-CM

## 2023-04-26 NOTE — Progress Notes (Signed)
<>*<>*<>*<>*<>*<>*<>*<>*<>*<>*<>*<>*<>*<>*<>*<>*<>*<>*<>*<>*<>*<>*<>*<>*<> <>*<>*<>*<>*<>*<>*<>*<>*<>*<>*<>*<>*<>*<>*<>*<>*<>*<>*<>*<>*<>*<>*<>*<>*<>  -  Test results slightly outside the reference range are not unusual. If there is anything important, I will review this with you,  otherwise it is considered normal test values.  If you have further questions,  please do not hesitate to contact me at the office or via My Chart.   <>*<>*<>*<>*<>*<>*<>*<>*<>*<>*<>*<>*<>*<>*<>*<>*<>*<>*<>*<>*<>*<>*<>*<>*<> <>*<>*<>*<>*<>*<>*<>*<>*<>*<>*<>*<>*<>*<>*<>*<>*<>*<>*<>*<>*<>*<>*<>*<>*<>  -  U/A is suspicious for UTI - Have requested culture for possible Infection  <>*<>*<>*<>*<>*<>*<>*<>*<>*<>*<>*<>*<>*<>*<>*<>*<>*<>*<>*<>*<>*<>*<>*<>*<> <>*<>*<>*<>*<>*<>*<>*<>*<>*<>*<>*<>*<>*<>*<>*<>*<>*<>*<>*<>*<>*<>*<>*<>*<>   - Kidney functions look a little dehydrated  ( GFR is Decreased )   - Very important to drink adequate amounts of fluids to prevent permanent damage    - Recommend drink at least 6 bottles (16 ounces) of fluids /water /day = 96 Oz ~100 oz  - 100 oz = 3,000 cc or 3 liters / day  - >> That's 1 &1/2 bottles of a 2 liter soda bottle /day !   <>*<>*<>*<>*<>*<>*<>*<>*<>*<>*<>*<>*<>*<>*<>*<>*<>*<>*<>*<>*<>*<>*<>*<>*<> <>*<>*<>*<>*<>*<>*<>*<>*<>*<>*<>*<>*<>*<>*<>*<>*<>*<>*<>*<>*<>*<>*<>*<>*<>  -  All Else - CBC - Kidneys - Electrolytes - Liver - Magnesium & Thyroid    - all  Normal / OK  <>*<>*<>*<>*<>*<>*<>*<>*<>*<>*<>*<>*<>*<>*<>*<>*<>*<>*<>*<>*<>*<>*<>*<>*<> <>*<>*<>*<>*<>*<>*<>*<>*<>*<>*<>*<>*<>*<>*<>*<>*<>*<>*<>*<>*<>*<>*<>*<>*<>

## 2023-04-27 ENCOUNTER — Encounter: Payer: Self-pay | Admitting: Internal Medicine

## 2023-04-28 DIAGNOSIS — N390 Urinary tract infection, site not specified: Secondary | ICD-10-CM | POA: Diagnosis not present

## 2023-04-30 ENCOUNTER — Other Ambulatory Visit: Payer: Self-pay | Admitting: Internal Medicine

## 2023-04-30 DIAGNOSIS — N390 Urinary tract infection, site not specified: Secondary | ICD-10-CM

## 2023-04-30 LAB — URINE CULTURE
MICRO NUMBER:: 15777318
SPECIMEN QUALITY:: ADEQUATE

## 2023-04-30 MED ORDER — CIPROFLOXACIN HCL 250 MG PO TABS: ORAL_TABLET | ORAL | 0 refills | Status: DC

## 2023-04-30 NOTE — Progress Notes (Signed)
[][][][][][][][][][][][][][][][][][][][][][][][][][][][][][][][][][][][][][][][][]][][][][][][][][][][][][][][][][][][][][][][][[][][][][] [][][][][][][][][][][][][][][][][][][][][][][][][][][][][][][][][][][][][][][][][]][][][][][][][][][][][][][][][][][][][][][][][[][][][][]  -   Urine culture grew an infection  - UTI = Bladder Infection, So   Sent in new Rx for an antibiotic for 10 days  - Please schedule a Nurse Visit in about a month to recheck Urine to assure infection cleared up   [] [] [] [] [] [] [] [] [] [] [] [] [] [] [] [] [] [] [] [] [] [] [] [] [] [] [] [] [] [] [] [] [] [] [] [] [] [] [] [] [] ][] [] [] [] [] [] [] [] [] [] [] [] [] [] [] [] [] [] [] [] [] [] [[] [] [] [] []  [] [] [] [] [] [] [] [] [] [] [] [] [] [] [] [] [] [] [] [] [] [] [] [] [] [] [] [] [] [] [] [] [] [] [] [] [] [] [] [] [] ][] [] [] [] [] [] [] [] [] [] [] [] [] [] [] [] [] [] [] [] [] [] [[] [] [] [] []

## 2023-05-05 NOTE — Progress Notes (Signed)
Patient is aware of urine culture results and states that she will go pick up the Cipro that was sent in for her on 11/27. 1 month NV has been scheduled to re-check urine. -e welch

## 2023-05-15 ENCOUNTER — Other Ambulatory Visit: Payer: Self-pay | Admitting: Nurse Practitioner

## 2023-05-15 ENCOUNTER — Other Ambulatory Visit: Payer: Self-pay | Admitting: Internal Medicine

## 2023-05-15 DIAGNOSIS — I10 Essential (primary) hypertension: Secondary | ICD-10-CM

## 2023-06-05 ENCOUNTER — Ambulatory Visit (INDEPENDENT_AMBULATORY_CARE_PROVIDER_SITE_OTHER): Payer: PPO

## 2023-06-05 DIAGNOSIS — N390 Urinary tract infection, site not specified: Secondary | ICD-10-CM

## 2023-06-05 NOTE — Progress Notes (Signed)
 Patient presents to the office for a nurse visit to have a urine recheck from recent treatment for a UTI. No signs or symptoms.

## 2023-06-06 LAB — URINE CULTURE
MICRO NUMBER:: 15911065
Result:: NO GROWTH
SPECIMEN QUALITY:: ADEQUATE

## 2023-06-06 LAB — URINALYSIS, ROUTINE W REFLEX MICROSCOPIC
Bilirubin Urine: NEGATIVE
Glucose, UA: NEGATIVE
Hgb urine dipstick: NEGATIVE
Ketones, ur: NEGATIVE
Leukocytes,Ua: NEGATIVE
Nitrite: NEGATIVE
Protein, ur: NEGATIVE
Specific Gravity, Urine: 1.012 (ref 1.001–1.035)
pH: 8 (ref 5.0–8.0)

## 2023-06-08 NOTE — Progress Notes (Signed)
[][][][][][][][][][][][][][][][][][][][][][][][][][][][][][][][][][][][][][][][][]][][][][][][][][][][][][][][][][][][][][][][][[][][][][] [][][][][][][][][][][][][][][][][][][][][][][][][][][][][][][][][][][][][][][][][]][][][][][][][][][][][][][][][][][][][][][][][[][][][][]  -   Urine Culture - did  NOT  GROW any Infection - Great !   [] [] [] [] [] [] [] [] [] [] [] [] [] [] [] [] [] [] [] [] [] [] [] [] [] [] [] [] [] [] [] [] [] [] [] [] [] [] [] [] [] ][] [] [] [] [] [] [] [] [] [] [] [] [] [] [] [] [] [] [] [] [] [] [[] [] [] [] []  [] [] [] [] [] [] [] [] [] [] [] [] [] [] [] [] [] [] [] [] [] [] [] [] [] [] [] [] [] [] [] [] [] [] [] [] [] [] [] [] [] ][] [] [] [] [] [] [] [] [] [] [] [] [] [] [] [] [] [] [] [] [] [] [[] [] [] [] []

## 2023-06-23 ENCOUNTER — Other Ambulatory Visit: Payer: Self-pay | Admitting: Nurse Practitioner

## 2023-06-23 DIAGNOSIS — N3281 Overactive bladder: Secondary | ICD-10-CM

## 2023-07-23 ENCOUNTER — Other Ambulatory Visit: Payer: Self-pay

## 2023-07-23 DIAGNOSIS — N3281 Overactive bladder: Secondary | ICD-10-CM

## 2023-07-23 DIAGNOSIS — E782 Mixed hyperlipidemia: Secondary | ICD-10-CM

## 2023-07-23 MED ORDER — OXYBUTYNIN CHLORIDE ER 10 MG PO TB24: 10.0000 mg | ORAL_TABLET | Freq: Every day | ORAL | 0 refills | Status: DC

## 2023-07-23 MED ORDER — ROSUVASTATIN CALCIUM 20 MG PO TABS: ORAL_TABLET | ORAL | 0 refills | Status: DC

## 2023-08-07 ENCOUNTER — Encounter: Payer: Self-pay | Admitting: *Deleted

## 2023-08-13 ENCOUNTER — Ambulatory Visit: Payer: PPO | Admitting: Nurse Practitioner

## 2023-08-13 ENCOUNTER — Other Ambulatory Visit: Payer: Self-pay | Admitting: Family

## 2023-08-13 DIAGNOSIS — N3281 Overactive bladder: Secondary | ICD-10-CM

## 2023-08-29 ENCOUNTER — Telehealth: Payer: Self-pay

## 2023-08-29 ENCOUNTER — Encounter: Payer: Self-pay | Admitting: Family Medicine

## 2023-08-29 ENCOUNTER — Ambulatory Visit: Payer: PPO | Admitting: Family Medicine

## 2023-08-29 VITALS — BP 128/70 | HR 60 | Temp 97.6°F | Ht 63.0 in | Wt 149.0 lb

## 2023-08-29 DIAGNOSIS — N182 Chronic kidney disease, stage 2 (mild): Secondary | ICD-10-CM

## 2023-08-29 DIAGNOSIS — I10 Essential (primary) hypertension: Secondary | ICD-10-CM | POA: Diagnosis not present

## 2023-08-29 DIAGNOSIS — E1122 Type 2 diabetes mellitus with diabetic chronic kidney disease: Secondary | ICD-10-CM

## 2023-08-29 DIAGNOSIS — G4730 Sleep apnea, unspecified: Secondary | ICD-10-CM

## 2023-08-29 DIAGNOSIS — Z8639 Personal history of other endocrine, nutritional and metabolic disease: Secondary | ICD-10-CM | POA: Diagnosis not present

## 2023-08-29 DIAGNOSIS — R5383 Other fatigue: Secondary | ICD-10-CM | POA: Diagnosis not present

## 2023-08-29 DIAGNOSIS — N3281 Overactive bladder: Secondary | ICD-10-CM

## 2023-08-29 DIAGNOSIS — Z7984 Long term (current) use of oral hypoglycemic drugs: Secondary | ICD-10-CM

## 2023-08-29 DIAGNOSIS — E559 Vitamin D deficiency, unspecified: Secondary | ICD-10-CM | POA: Diagnosis not present

## 2023-08-29 DIAGNOSIS — R32 Unspecified urinary incontinence: Secondary | ICD-10-CM | POA: Diagnosis not present

## 2023-08-29 DIAGNOSIS — B351 Tinea unguium: Secondary | ICD-10-CM

## 2023-08-29 DIAGNOSIS — E785 Hyperlipidemia, unspecified: Secondary | ICD-10-CM

## 2023-08-29 DIAGNOSIS — E1169 Type 2 diabetes mellitus with other specified complication: Secondary | ICD-10-CM

## 2023-08-29 LAB — COMPREHENSIVE METABOLIC PANEL WITH GFR
ALT: 18 U/L (ref 0–35)
AST: 27 U/L (ref 0–37)
Albumin: 4.6 g/dL (ref 3.5–5.2)
Alkaline Phosphatase: 76 U/L (ref 39–117)
BUN: 12 mg/dL (ref 6–23)
CO2: 28 meq/L (ref 19–32)
Calcium: 9.9 mg/dL (ref 8.4–10.5)
Chloride: 102 meq/L (ref 96–112)
Creatinine, Ser: 0.93 mg/dL (ref 0.40–1.20)
GFR: 59.22 mL/min — ABNORMAL LOW (ref >60.00)
Glucose, Bld: 74 mg/dL (ref 70–99)
Potassium: 4.1 meq/L (ref 3.5–5.1)
Sodium: 140 meq/L (ref 135–145)
Total Bilirubin: 0.6 mg/dL (ref 0.2–1.2)
Total Protein: 7.4 g/dL (ref 6.0–8.3)

## 2023-08-29 LAB — CBC WITH DIFFERENTIAL/PLATELET
Basophils Absolute: 0 10*3/uL (ref 0.0–0.1)
Basophils Relative: 0.5 % (ref 0.0–3.0)
Eosinophils Absolute: 0.2 10*3/uL (ref 0.0–0.7)
Eosinophils Relative: 2.8 % (ref 0.0–5.0)
HCT: 35.9 % — ABNORMAL LOW (ref 36.0–46.0)
Hemoglobin: 11.4 g/dL — ABNORMAL LOW (ref 12.0–15.0)
Lymphocytes Relative: 29.3 % (ref 12.0–46.0)
Lymphs Abs: 1.7 10*3/uL (ref 0.7–4.0)
MCHC: 31.9 g/dL (ref 30.0–36.0)
MCV: 73.6 fl — ABNORMAL LOW (ref 78.0–100.0)
Monocytes Absolute: 0.6 10*3/uL (ref 0.1–1.0)
Monocytes Relative: 10.9 % (ref 3.0–12.0)
Neutro Abs: 3.3 10*3/uL (ref 1.4–7.7)
Neutrophils Relative %: 56.5 % (ref 43.0–77.0)
Platelets: 217 10*3/uL (ref 150.0–400.0)
RBC: 4.87 Mil/uL (ref 3.87–5.11)
RDW: 17.7 % — ABNORMAL HIGH (ref 11.5–15.5)
WBC: 5.8 10*3/uL (ref 4.0–10.5)

## 2023-08-29 LAB — URINALYSIS WITH CULTURE, IF INDICATED
Bilirubin Urine: NEGATIVE
Hgb urine dipstick: NEGATIVE
Ketones, ur: NEGATIVE
Leukocytes,Ua: NEGATIVE
Nitrite: NEGATIVE
RBC / HPF: NONE SEEN (ref 0–?)
Specific Gravity, Urine: 1.01 (ref 1.000–1.030)
Total Protein, Urine: NEGATIVE
Urine Glucose: NEGATIVE
Urobilinogen, UA: 0.2 (ref 0.0–1.0)
pH: 6 (ref 5.0–8.0)

## 2023-08-29 LAB — FERRITIN: Ferritin: 5.3 ng/mL — ABNORMAL LOW (ref 10.0–291.0)

## 2023-08-29 LAB — VITAMIN D 25 HYDROXY (VIT D DEFICIENCY, FRACTURES): VITD: >120 ng/mL

## 2023-08-29 LAB — FOLATE: Folate: 15.7 ng/mL (ref >5.9)

## 2023-08-29 LAB — VITAMIN B12: Vitamin B-12: 693 pg/mL (ref 211–911)

## 2023-08-29 LAB — MICROALBUMIN / CREATININE URINE RATIO
Creatinine,U: 40 mg/dL
Microalb Creat Ratio: 18.3 mg/g (ref 0.0–30.0)
Microalb, Ur: 0.7 mg/dL (ref 0.0–1.9)

## 2023-08-29 LAB — T4, FREE: Free T4: 0.92 ng/dL (ref 0.60–1.60)

## 2023-08-29 LAB — TSH: TSH: 0.95 u[IU]/mL (ref 0.35–5.50)

## 2023-08-29 NOTE — Telephone Encounter (Signed)
 Left detailed message for patient on cell phone number, attempted to call home phone no answer

## 2023-08-29 NOTE — Progress Notes (Signed)
 New Patient Office Visit  Subjective    Patient ID: Michele [name], female    DOB: January 06, 1946  Age: 78 y.o. MRN: [id number]  CC:  Chief Complaint  Patient presents with   Establish Care    Gets so tired easily, usually around lunch. BP running high recently around 145 this morning, evenings can get up to 180    HPI Michele [name] presents to establish care Previous PCP Dr. Oneta Rack   HTN - reports good compliance with medications including olmesartan, verapamil, minoxidil, atenolol and if her SBP is >160 she takes clonidine.   C/o fatigue for the past year.   Hospitalized for a week in January for a viral illness.   OAB- worsening symptoms. Has to wear a pad now and having incontinence for the past year. She has been taking oxybutynin for years Urgency and frequency worsening. No dysuria  Denies seeing urology in the past   DM- checks BS every morning  On metformin 1,000 mg bid  FBS 109 today   States she has lost several family members and friends in the past 3 months.   She has been having dental work   Right great toenail with fungus x 1 year. Has been taking terbinafine for the past year. No improvement.   [place]  Married    Outpatient Encounter Medications as of 08/29/2023  Medication Sig   acetaminophen (TYLENOL) 500 MG tablet Take 500 mg by mouth 3 (three) times daily as needed for moderate pain.   aspirin 325 MG tablet Take 325 mg by mouth daily.   atenolol (TENORMIN) 100 MG tablet Take  1 tablet  every Morning for BP                                                                               /                            TAKE                                                   BY                         MOUTH   CALCIUM MAGNESIUM ZINC PO Take by mouth.   Cholecalciferol (VITAMIN D-3 PO) Take by mouth daily. Take one capsule daily   cloNIDine (CATAPRES) 0.2 MG tablet TAKE 1 TABLET BY MOUTH THREE TIMES DAILY AS NEEDED IF  BLOOD  PRESSURE  IS  OVER   160   Cyanocobalamin (VITAMIN B-12 PO) Take 1 tablet by mouth daily. 5000 mc one time in the AM PRN   metFORMIN (GLUCOPHAGE-XR) 500 MG 24 hr tablet Take  2 tablets   2 x /day   with Meals  for Diabetes   minoxidil (LONITEN) 10 MG tablet Take  1 tablet  Daily for BP                               /  TAKE                                         BY                                                 MOUTH                           once ? ? ?   daily   olmesartan (BENICAR) 40 MG tablet TAKE 1 TABLET BY MOUTH IN THE EVENING FOR BLOOD PRESSURE   oxybutynin (DITROPAN-XL) 10 MG 24 hr tablet Take 1 tablet (10 mg total) by mouth daily.   rosuvastatin (CRESTOR) 20 MG tablet TAKE 1 TABLET BY MOUTH IN THE MORNING   sertraline (ZOLOFT) 100 MG tablet TAKE 1 & 1/2 (ONE & ONE-HALF) TABLETS BY MOUTH ONCE DAILY   verapamil (CALAN-SR) 240 MG CR tablet TAKE 1 TABLET BY MOUTH IN THE EVENING WITH FOOD   [DISCONTINUED] terbinafine (LAMISIL) 250 MG tablet Take 250 mg by mouth daily.   dexamethasone (DECADRON) 4 MG tablet Take 1 tab 3 x day - 3 days, then 2 x day - 3 days, then 1 tab daily (Patient not taking: Reported on 08/29/2023)   [DISCONTINUED] atenolol (TENORMIN) 50 MG tablet TAKE 1 TO 2 TABLETS BY MOUTH IN THE MORNING FOR BLOOD PRESSURE   [DISCONTINUED] azithromycin (ZITHROMAX) 250 MG tablet Take 2 tablets with Food on  Day 1, then 1 tablet Daily with Food for Sinusitis / Bronchitis   [DISCONTINUED] blood glucose meter kit and supplies KIT Check blood sugar 1 time daily-DX-E11.22   [DISCONTINUED] blood glucose meter kit and supplies Test sugar once a day.   [DISCONTINUED] Blood Glucose Monitoring Suppl DEVI Test sugars once daily.DX diabetes   [DISCONTINUED] ciprofloxacin (CIPRO) 250 MG tablet Take  1 tablet  2 x / day  with Meals for  UTI                                /                                                                   TAKE                                          BY                                                 MOUTH   [DISCONTINUED] glucose blood test strip Test  Blood Sugar  Daily  (Dx:  e11.29  ))   [DISCONTINUED] Lancets MISC Test blood sugar once daily   [DISCONTINUED] promethazine-dextromethorphan (PROMETHAZINE-DM) 6.25-15 MG/5ML syrup  Take 1 tsp every 4 hours if needed for cough   No facility-administered encounter medications on file as of 08/29/2023.    Past Medical History:  Diagnosis Date   Anemia    Anxiety    Arthritis    Carpal tunnel syndrome    Cataract    bilateral,removed   Cerebrovascular disease 01/02/2017   Duodenal diverticulum    Elevated uric acid in blood    Esophageal motility disorder    Fatty liver    GERD (gastroesophageal reflux disease)    Hyperlipidemia    Hypertension    Lymphocytic colitis 09/22/2012   Sleep apnea    Tubulovillous adenoma of colon    Type II or unspecified type diabetes mellitus without mention of complication, not stated as uncontrolled     Past Surgical History:  Procedure Laterality Date   APPENDECTOMY     BREAST BIOPSY Right    CARPAL TUNNEL RELEASE Right 1992   cataract extration     COLONOSCOPY     CYST REMOVAL HAND     TONSILLECTOMY  1952    Family History  Problem Relation Age of Onset   Heart disease Mother    Diabetes Mother    Heart disease Father    Diabetes Father    Heart attack Father    Cerebral aneurysm Sister    Heart disease Brother    Heart attack Brother    Heart disease Brother        Stent   Colon cancer Neg Hx    Esophageal cancer Neg Hx    Stomach cancer Neg Hx    Rectal cancer Neg Hx    Breast cancer Neg Hx    Sleep apnea Neg Hx    Colon polyps Neg Hx    Crohn's disease Neg Hx    Ulcerative colitis Neg Hx     Social History   Socioeconomic History   Marital status: Married    Spouse name: Alinda Money   Number of children: 1   Years of education: 12   Highest education level: Not on file  Occupational History    Occupation: Retired    Associate Professor: [employer]  Tobacco Use   Smoking status: Never   Smokeless tobacco: Never  Vaping Use   Vaping status: Never Used  Substance and Sexual Activity   Alcohol use: No   Drug use: No   Sexual activity: Never    Partners: Male    Comment: Married  Other Topics Concern   Not on file  Social History Narrative   Lives with husband.  Independent of ADLs. 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   Right-handed   Caffeine: about 1 cup of coffee per day, tea and Cokes a few times per week   Social Drivers of Health   Financial Resource Strain: Not on file  Food Insecurity: No Food Insecurity (06/11/2022)   Hunger Vital Sign    Worried About Running Out of Food in the Last Year: Never true    Ran Out of Food in the Last Year: Never true  Transportation Needs: No Transportation Needs (06/11/2022)   PRAPARE - Administrator, Civil Service (Medical): No    Lack of Transportation (Non-Medical): No  Physical Activity: Not on file  Stress: Not on file  Social Connections: Not on file  Intimate Partner Violence: Not At Risk (06/11/2022)   Humiliation, Afraid, Rape, and Kick questionnaire    Fear of Current or Ex-Partner:  No    Emotionally Abused: No    Physically Abused: No    Sexually Abused: No    Review of Systems  Constitutional:  Positive for malaise/fatigue. Negative for chills, fever and weight loss.  Eyes:  Negative for blurred vision and double vision.  Respiratory:  Negative for shortness of breath.   Cardiovascular:  Negative for chest pain, palpitations and leg swelling.  Gastrointestinal:  Negative for abdominal pain, blood in stool, constipation, diarrhea, nausea and vomiting.  Genitourinary:  Positive for frequency and urgency. Negative for dysuria and hematuria.       Incontinence, wears pad, worsening   Neurological:  Negative for dizziness, focal weakness and headaches.  Psychiatric/Behavioral:  Negative for  depression. The patient is not nervous/anxious.         Objective    BP 128/70 (BP Location: Left Arm, Patient Position: Sitting)   Pulse 60   Temp 97.6 F (36.4 C) (Temporal)   Ht 5\' 3"  (1.6 m)   Wt 149 lb (67.6 kg)   SpO2 98%   BMI 26.39 kg/m   Physical Exam Constitutional:      General: She is not in acute distress.    Appearance: She is not ill-appearing.  Eyes:     Extraocular Movements: Extraocular movements intact.     Conjunctiva/sclera: Conjunctivae normal.  Cardiovascular:     Rate and Rhythm: Normal rate and regular rhythm.  Pulmonary:     Effort: Pulmonary effort is normal.     Breath sounds: Normal breath sounds.  Musculoskeletal:     Cervical back: Normal range of motion and neck supple.  Skin:    General: Skin is warm and dry.     Coloration: Skin is not pale.  Neurological:     General: No focal deficit present.     Mental Status: She is alert and oriented to person, place, and time.     Motor: No weakness.     Coordination: Coordination normal.     Gait: Gait normal.  Psychiatric:        Mood and Affect: Mood normal.        Behavior: Behavior normal.        Thought Content: Thought content normal.         Assessment & Plan:   Problem List Items Addressed This Visit     CKD stage 2 due to type 2 diabetes mellitus (HCC)   Continue to monitor.  Recommend good hypertension and diabetes control      Essential hypertension - Primary   Continue current medication regimen.  She has been on this regimen for years.  Closely monitor blood pressure at home.  Follow-up if not controlled.      Relevant Orders   CBC with Differential/Platelet (Completed)   Comprehensive metabolic panel with GFR (Completed)   Fatigue   Ongoing for a year or longer.  No other red flag symptoms.  Check labs to look for underlying etiology.  Colonoscopy in October 2024.      Relevant Orders   CBC with Differential/Platelet (Completed)   Comprehensive metabolic  panel with GFR (Completed)   TSH (Completed)   T4, free (Completed)   Vitamin B12 (Completed)   VITAMIN D 25 Hydroxy (Vit-D Deficiency, Fractures) (Completed)   Ferritin (Completed)   Folate (Completed)   History of hyperthyroidism   Not on medication.  Check thyroid function and follow-up      Relevant Orders   TSH (Completed)   T4, free (Completed)  Hyperlipidemia associated with type 2 diabetes mellitus (HCC)   Reviewed recent labs from PCP.  She will continue current statin therapy, low-fat diet and getting regular exercise.  Recheck fasting lipids at her CPE.      Onychomycosis of toenail   She has been taking oral terbinafine for over a year with her previous PCP.  No improvement in symptoms.  She will stop oral antifungal and referral made to podiatry.      Relevant Orders   Ambulatory referral to Podiatry   Overactive bladder   Relevant Orders   Ambulatory referral to Urogynecology   Sleep apnea on CPAP   Reports good compliance      Type 2 diabetes mellitus (HCC)   Reports blood sugars are well-controlled.  Continue metformin.  Continue low sugar, low-carb diet and exercise.  Check A1c, urine microalbumin and follow-up      Relevant Orders   CBC with Differential/Platelet (Completed)   Comprehensive metabolic panel with GFR (Completed)   TSH (Completed)   T4, free (Completed)   Vitamin B12 (Completed)   Microalbumin / creatinine urine ratio (Completed)   Hemoglobin A1c (Completed)   Ferritin (Completed)   Folate (Completed)   Urinary incontinence   Reports oxybutynin has been helpful in the past but no longer working for her.  Check UA today.  Incontinence worsening.  Referral to urogynecology      Relevant Orders   Ambulatory referral to Urogynecology   Urinalysis with Culture, if indicated (Completed)   Vitamin D deficiency   He was taking high-dose vitamin D.  Check vitamin D follow-up      Relevant Orders   VITAMIN D 25 Hydroxy (Vit-D Deficiency,  Fractures) (Completed)    Received critical vitamin D level greater than 120.  She will stop all vitamin D supplements.   Return in about 4 weeks (around 09/26/2023) for chronic health conditions.   Hetty Blend, NP-C

## 2023-08-29 NOTE — Patient Instructions (Signed)
 Please go downstairs for labs and a urine test.    You will hear from [facility] and the urogynecologist for your urinary issues.

## 2023-08-29 NOTE — Telephone Encounter (Signed)
 CRITICAL VALUE STICKER  CRITICAL VALUE: vit D >120  RECEIVER (on-site recipient of call): Joani Cosma  DATE & TIME NOTIFIED: 08/29/23 at 2:26 pm  MESSENGER (representative from lab): Hope  NOTIFIED: vickie henson

## 2023-08-30 LAB — HEMOGLOBIN A1C: Hgb A1c MFr Bld: 6.6 % — ABNORMAL HIGH (ref 4.6–6.5)

## 2023-08-31 ENCOUNTER — Encounter: Payer: Self-pay | Admitting: Family Medicine

## 2023-08-31 DIAGNOSIS — B351 Tinea unguium: Secondary | ICD-10-CM | POA: Insufficient documentation

## 2023-08-31 DIAGNOSIS — N3281 Overactive bladder: Secondary | ICD-10-CM | POA: Insufficient documentation

## 2023-08-31 DIAGNOSIS — R32 Unspecified urinary incontinence: Secondary | ICD-10-CM | POA: Insufficient documentation

## 2023-08-31 NOTE — Assessment & Plan Note (Signed)
 She has been taking oral terbinafine for over a year with her previous PCP.  No improvement in symptoms.  She will stop oral antifungal and referral made to podiatry.

## 2023-08-31 NOTE — Assessment & Plan Note (Signed)
 Not on medication.  Check thyroid function and follow up

## 2023-08-31 NOTE — Assessment & Plan Note (Signed)
 Reports blood sugars are well-controlled.  Continue metformin.  Continue low sugar, low-carb diet and exercise.  Check A1c, urine microalbumin and follow-up

## 2023-08-31 NOTE — Assessment & Plan Note (Signed)
 Reports oxybutynin has been helpful in the past but no longer working for her.  Check UA today.  Incontinence worsening.  Referral to urogynecology

## 2023-08-31 NOTE — Progress Notes (Signed)
 Please reach out and make sure she received notification regarding her critically elevated vitamin D level and that she has stopped taking vitamin D completely.  Her A1c is 6.6% and her diabetes is still well-controlled.  She is mildly anemic and her iron is low.  I would like to recheck this and follow-up on her anemia in 4 weeks when she returns for an office visit.  She may start taking an over-the-counter iron supplement daily.  We may need to refer her back to her gastroenterologist for this.

## 2023-08-31 NOTE — Assessment & Plan Note (Signed)
 He was taking high-dose vitamin D.  Check vitamin D follow-up

## 2023-08-31 NOTE — Assessment & Plan Note (Signed)
 Continue current medication regimen.  She has been on this regimen for years.  Closely monitor blood pressure at home.  Follow-up if not controlled.

## 2023-08-31 NOTE — Assessment & Plan Note (Signed)
Reports good compliance

## 2023-08-31 NOTE — Assessment & Plan Note (Signed)
 Continue to monitor.  Recommend good hypertension and diabetes control

## 2023-08-31 NOTE — Assessment & Plan Note (Signed)
 Reviewed recent labs from PCP.  She will continue current statin therapy, low-fat diet and getting regular exercise.  Recheck fasting lipids at her CPE.

## 2023-08-31 NOTE — Assessment & Plan Note (Signed)
 Ongoing for a year or longer.  No other red flag symptoms.  Check labs to look for underlying etiology.  Colonoscopy in October 2024.

## 2023-09-01 NOTE — Telephone Encounter (Signed)
 Attempted to reach patient again, unable to reach.

## 2023-09-01 NOTE — Telephone Encounter (Signed)
 Sent My-Chart message

## 2023-09-04 ENCOUNTER — Other Ambulatory Visit: Payer: Self-pay | Admitting: Family Medicine

## 2023-09-04 DIAGNOSIS — I10 Essential (primary) hypertension: Secondary | ICD-10-CM

## 2023-09-04 DIAGNOSIS — E1122 Type 2 diabetes mellitus with diabetic chronic kidney disease: Secondary | ICD-10-CM

## 2023-09-04 DIAGNOSIS — E782 Mixed hyperlipidemia: Secondary | ICD-10-CM

## 2023-09-04 DIAGNOSIS — R0989 Other specified symptoms and signs involving the circulatory and respiratory systems: Secondary | ICD-10-CM

## 2023-09-04 NOTE — Telephone Encounter (Signed)
 Copied from CRM [id number]. Topic: Clinical - Medication Refill >> Sep 04, 2023 10:09 AM Alcus Dad wrote: Most Recent Primary Care Visit:  Provider: Avanell Shackleton  Department: [facility]  Visit Type: NEW PT - OFFICE VISIT  Date: 08/29/2023  Medication: atenolol (TENORMIN) 100 MG tablet cloNIDine (CATAPRES) 0.2 MG tablet metFORMIN (GLUCOPHAGE-XR) 500 MG 24 hr tablet minoxidil (LONITEN) 10 MG tablet olmesartan (BENICAR) 40 MG tablet oxybutynin (DITROPAN-XL) 10 MG 24 hr tablet (90 day supply) rosuvastatin (CRESTOR) 20 MG tablet verapamil (CALAN-SR) 240 MG CR tablet  Has the patient contacted their pharmacy? Yes (Agent: If no, request that the patient contact the pharmacy for the refill. If patient does not wish to contact the pharmacy document the reason why and proceed with request.) (Agent: If yes, when and what did the pharmacy advise?)  Is this the correct pharmacy for this prescription? Yes If no, delete pharmacy and type the correct one.  This is the patient's preferred pharmacy:  [facility] [address] [place] - [address] [place] [address] Phone: [phone] Fax: [phone]   Has the prescription been filled recently? No  Is the patient out of the medication? No  Has the patient been seen for an appointment in the last year OR does the patient have an upcoming appointment? Yes  Can we respond through MyChart? Yes  Agent: Please be advised that Rx refills may take up to 3 business days. We ask that you follow-up with your pharmacy.

## 2023-09-08 ENCOUNTER — Telehealth: Payer: Self-pay | Admitting: Family Medicine

## 2023-09-08 MED ORDER — VERAPAMIL HCL ER 240 MG PO TBCR: EXTENDED_RELEASE_TABLET | ORAL | 0 refills | Status: DC

## 2023-09-08 MED ORDER — ATENOLOL 100 MG PO TABS: ORAL_TABLET | ORAL | 0 refills | Status: DC

## 2023-09-08 MED ORDER — OLMESARTAN MEDOXOMIL 40 MG PO TABS: ORAL_TABLET | ORAL | 0 refills | Status: DC

## 2023-09-08 MED ORDER — CLONIDINE HCL 0.2 MG PO TABS: ORAL_TABLET | ORAL | 0 refills | Status: DC

## 2023-09-08 MED ORDER — MINOXIDIL 10 MG PO TABS: ORAL_TABLET | ORAL | 3 refills | Status: AC

## 2023-09-08 MED ORDER — METFORMIN HCL ER 500 MG PO TB24
ORAL_TABLET | ORAL | 0 refills | Status: DC
Start: 2023-09-08 — End: 2024-02-19

## 2023-09-08 MED ORDER — ROSUVASTATIN CALCIUM 20 MG PO TABS: ORAL_TABLET | ORAL | 0 refills | Status: DC

## 2023-09-08 MED ORDER — SERTRALINE HCL 100 MG PO TABS: ORAL_TABLET | ORAL | 0 refills | Status: DC

## 2023-09-08 NOTE — Telephone Encounter (Signed)
 Original request was never routed so we were unaware of request. Rx has been sent.

## 2023-09-08 NOTE — Telephone Encounter (Signed)
 Request never routed to me, please see other encounter but refills have been sent.

## 2023-09-08 NOTE — Telephone Encounter (Signed)
 Copied from CRM [id number]. Topic: Clinical - Prescription Issue >> Sep 08, 2023  8:53 AM Michele [name] C wrote: Reason for CRM: patient sent in prescription refill requests on 4/3 for atenolol (TENORMIN) 100 MG tablet, cloNIDine (CATAPRES) 0.2 MG tablet, metFORMIN (GLUCOPHAGE-XR) 500 MG 24 hr tablet, minoxidil (LONITEN) 10 MG tablet, olmesartan (BENICAR) 40 MG tablet, oxybutynin (DITROPAN-XL) 10 MG 24 hr tablet (90 day supply), rosuvastatin (CRESTOR) 20 MG tablet, and  verapamil (CALAN-SR) 240 MG CR tablet. Patient previously saw Dr. Oneta Rack, however just started seeing [facility]. She is in need of these prescriptions and stated that she is out of two of them. Please advise and patient asked that she receive a call once prescriptions are sent. She stated she will be out today with her husband so you can call her husband's cell phone.

## 2023-09-23 ENCOUNTER — Ambulatory Visit (INDEPENDENT_AMBULATORY_CARE_PROVIDER_SITE_OTHER): Admitting: Podiatry

## 2023-09-23 DIAGNOSIS — B351 Tinea unguium: Secondary | ICD-10-CM

## 2023-09-23 NOTE — Progress Notes (Unsigned)
      Chief Complaint  Patient presents with   Nail Problem    new pt-onychomycosis of toenails on feet Has been using medication for years but not helping patient is also diabetic   HPI: 78 y.o. female presents today with c/o fungal toenail to the right great toe.  She was on oral terbinafine  fairly consistently over the past 3 years with minimal breaks in medication use. Her podiatrist passed away and she looked for a new provider.  She is interested in treating her nail fungus again.   Past Medical History:  Diagnosis Date   Anemia    Anxiety    Arthritis    Carpal tunnel syndrome    Cataract    bilateral,removed   Cerebrovascular disease 01/02/2017   Duodenal diverticulum    Elevated uric acid in blood    Esophageal motility disorder    Fatty liver    GERD (gastroesophageal reflux disease)    Hyperlipidemia    Hypertension    Lymphocytic colitis 09/22/2012   Sleep apnea    Tubulovillous adenoma of colon    Type II or unspecified type diabetes mellitus without mention of complication, not stated as uncontrolled     Past Surgical History:  Procedure Laterality Date   APPENDECTOMY     BREAST BIOPSY Right    CARPAL TUNNEL RELEASE Right 1992   cataract extration     COLONOSCOPY     CYST REMOVAL HAND     TONSILLECTOMY  1952    Allergies  Allergen Reactions   Lipitor [Atorvastatin]     Elevates LFT's    Physical Exam: Palpable pedal pulses.  Right great toenail is 3mm thick, with yellow discoloration, subungual debris and pain on palpation. Appears fungal.  Epicritic sensation intact.  Assessment/Plan of Care: 1. Dermatophytosis of nail     Discussed clinical findings with patient today.  Obtained clippings of the hallux toenail to send to [facility] labs for culture.  Informed patient that there is increased resistance being seen to antifungals, so the nail should be tested to confirm fungus and sensitivity.    Will contact patient with results in 2-3  weeks.   Joe Murders, DPM, FACFAS [facility]     [address] [place], [place] [address]                Office [phone]  Fax [phone]

## 2023-09-26 ENCOUNTER — Ambulatory Visit: Admitting: Family Medicine

## 2023-09-26 ENCOUNTER — Encounter: Payer: Self-pay | Admitting: Family Medicine

## 2023-09-26 VITALS — BP 134/76 | HR 63 | Temp 97.8°F | Ht 63.0 in | Wt 148.0 lb

## 2023-09-26 DIAGNOSIS — R5383 Other fatigue: Secondary | ICD-10-CM

## 2023-09-26 DIAGNOSIS — G4733 Obstructive sleep apnea (adult) (pediatric): Secondary | ICD-10-CM | POA: Diagnosis not present

## 2023-09-26 DIAGNOSIS — R32 Unspecified urinary incontinence: Secondary | ICD-10-CM

## 2023-09-26 DIAGNOSIS — N3281 Overactive bladder: Secondary | ICD-10-CM

## 2023-09-26 DIAGNOSIS — R7989 Other specified abnormal findings of blood chemistry: Secondary | ICD-10-CM

## 2023-09-26 DIAGNOSIS — D509 Iron deficiency anemia, unspecified: Secondary | ICD-10-CM | POA: Diagnosis not present

## 2023-09-26 LAB — CBC WITH DIFFERENTIAL/PLATELET
Basophils Absolute: 0 10*3/uL (ref 0.0–0.1)
Basophils Relative: 0.6 % (ref 0.0–3.0)
Eosinophils Absolute: 0.2 10*3/uL (ref 0.0–0.7)
Eosinophils Relative: 3.1 % (ref 0.0–5.0)
HCT: 36.2 % (ref 36.0–46.0)
Hemoglobin: 11.9 g/dL — ABNORMAL LOW (ref 12.0–15.0)
Lymphocytes Relative: 33.1 % (ref 12.0–46.0)
Lymphs Abs: 1.7 10*3/uL (ref 0.7–4.0)
MCHC: 32.8 g/dL (ref 30.0–36.0)
MCV: 75 fl — ABNORMAL LOW (ref 78.0–100.0)
Monocytes Absolute: 0.7 10*3/uL (ref 0.1–1.0)
Monocytes Relative: 13.7 % — ABNORMAL HIGH (ref 3.0–12.0)
Neutro Abs: 2.5 10*3/uL (ref 1.4–7.7)
Neutrophils Relative %: 49.5 % (ref 43.0–77.0)
Platelets: 161 10*3/uL (ref 150.0–400.0)
RBC: 4.83 Mil/uL (ref 3.87–5.11)
RDW: 20 % — ABNORMAL HIGH (ref 11.5–15.5)
WBC: 5.1 10*3/uL (ref 4.0–10.5)

## 2023-09-26 LAB — FERRITIN: Ferritin: 6 ng/mL — ABNORMAL LOW (ref 10.0–291.0)

## 2023-09-26 MED ORDER — OXYBUTYNIN CHLORIDE ER 10 MG PO TB24: 10.0000 mg | ORAL_TABLET | Freq: Every day | ORAL | 1 refills | Status: DC

## 2023-09-26 NOTE — Patient Instructions (Addendum)
 Please go downstairs for labs and stool kit   Continue taking iron .    [facility] Phone: [phone]

## 2023-09-26 NOTE — Progress Notes (Signed)
 Subjective:     Patient ID: Michele [name], female    DOB: 03-Jun-1946, 78 y.o.   MRN: [id number]  Chief Complaint  Patient presents with   Medical Management of Chronic Issues    4 week f/u    HPI  History of Present Illness         She is here for 4 wk follow up on IDA and vitamin D  elevation. She was previously taking high dose vitamin D  daily.   Colonoscopy UTD Denies hematuria or GI bleeding.   C/o fatigue. Significant. No dizziness, chest pain, palpitations, DOE  Taking ASA 325 mg day.   Vitamin D  >120      Health Maintenance Due  Topic Date Due   COVID-19 Vaccine (6 - Pfizer risk 2024-25 season) 10/02/2023    Past Medical History:  Diagnosis Date   Anemia    Anxiety    Arthritis    Carpal tunnel syndrome    Cataract    bilateral,removed   Cerebrovascular disease 01/02/2017   Duodenal diverticulum    Elevated uric acid in blood    Esophageal motility disorder    Fatty liver    GERD (gastroesophageal reflux disease)    Hyperlipidemia    Hypertension    Lymphocytic colitis 09/22/2012   Sleep apnea    Tubulovillous adenoma of colon    Type II or unspecified type diabetes mellitus without mention of complication, not stated as uncontrolled     Past Surgical History:  Procedure Laterality Date   APPENDECTOMY     BREAST BIOPSY Right    CARPAL TUNNEL RELEASE Right 1992   cataract extration     COLONOSCOPY     CYST REMOVAL HAND     TONSILLECTOMY  1952    Family History  Problem Relation Age of Onset   Heart disease Mother    Diabetes Mother    Heart disease Father    Diabetes Father    Heart attack Father    Cerebral aneurysm Sister    Heart disease Brother    Heart attack Brother    Heart disease Brother        Stent   Colon cancer Neg Hx    Esophageal cancer Neg Hx    Stomach cancer Neg Hx    Rectal cancer Neg Hx    Breast cancer Neg Hx    Sleep apnea Neg Hx    Colon polyps Neg Hx    Crohn's disease Neg Hx    Ulcerative colitis  Neg Hx     Social History   Socioeconomic History   Marital status: Married    Spouse name: Rufina Cough   Number of children: 1   Years of education: 12   Highest education level: Not on file  Occupational History   Occupation: Retired    Associate Professor: [employer]  Tobacco Use   Smoking status: Never   Smokeless tobacco: Never  Vaping Use   Vaping status: Never Used  Substance and Sexual Activity   Alcohol use: No   Drug use: No   Sexual activity: Never    Partners: Male    Comment: Married  Other Topics Concern   Not on file  Social History Narrative   Lives with husband.  Independent of ADLs. 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   Right-handed   Caffeine: about 1 cup of coffee per day, tea and Cokes a few times per week   Social Drivers of Health  Financial Resource Strain: Not on file  Food Insecurity: No Food Insecurity (06/11/2022)   Hunger Vital Sign    Worried About Running Out of Food in the Last Year: Never true    Ran Out of Food in the Last Year: Never true  Transportation Needs: No Transportation Needs (06/11/2022)   PRAPARE - Administrator, Civil Service (Medical): No    Lack of Transportation (Non-Medical): No  Physical Activity: Not on file  Stress: Not on file  Social Connections: Not on file  Intimate Partner Violence: Not At Risk (06/11/2022)   Humiliation, Afraid, Rape, and Kick questionnaire    Fear of Current or Ex-Partner: No    Emotionally Abused: No    Physically Abused: No    Sexually Abused: No    Outpatient Medications Prior to Visit  Medication Sig Dispense Refill   acetaminophen  (TYLENOL ) 500 MG tablet Take 500 mg by mouth 3 (three) times daily as needed for moderate pain.     aspirin  325 MG tablet Take 325 mg by mouth daily.     atenolol  (TENORMIN ) 100 MG tablet Take 1 tablet every morning for BP 90 tablet 0   CALCIUM  MAGNESIUM  ZINC PO Take by mouth.     Cholecalciferol  (VITAMIN D -3 PO) Take by mouth  daily. Take one capsule daily     cloNIDine  (CATAPRES ) 0.2 MG tablet TAKE 1 TABLET BY MOUTH THREE TIMES DAILY AS NEEDED IF  BLOOD  PRESSURE  IS  OVER  160 270 tablet 0   Cyanocobalamin  (VITAMIN B-12 PO) Take 1 tablet by mouth daily. 5000 mc one time in the AM PRN     dexamethasone  (DECADRON ) 4 MG tablet Take 1 tab 3 x day - 3 days, then 2 x day - 3 days, then 1 tab daily 20 tablet 0   metFORMIN  (GLUCOPHAGE -XR) 500 MG 24 hr tablet Take  2 tablets   2 x /day   with Meals  for Diabetes 360 tablet 0   minoxidil  (LONITEN ) 10 MG tablet Take 1 tablet daily for BP 90 tablet 3   olmesartan  (BENICAR ) 40 MG tablet TAKE 1 TABLET BY MOUTH IN THE EVENING FOR BLOOD PRESSURE 90 tablet 0   rosuvastatin  (CRESTOR ) 20 MG tablet TAKE 1 TABLET BY MOUTH IN THE MORNING 90 tablet 0   sertraline  (ZOLOFT ) 100 MG tablet TAKE 1 & 1/2 (ONE & ONE-HALF) TABLETS BY MOUTH ONCE DAILY 45 tablet 0   verapamil  (CALAN -SR) 240 MG CR tablet TAKE 1 TABLET BY MOUTH IN THE EVENING WITH FOOD 90 tablet 0   oxybutynin  (DITROPAN -XL) 10 MG 24 hr tablet Take 1 tablet (10 mg total) by mouth daily. (Patient not taking: Reported on 09/26/2023) 30 tablet 0   No facility-administered medications prior to visit.    Allergies  Allergen Reactions   Lipitor [Atorvastatin]     Elevates LFT's    Review of Systems  Constitutional:  Positive for malaise/fatigue. Negative for chills, diaphoresis and fever.  Eyes:  Negative for blurred vision and double vision.  Respiratory:  Negative for cough and shortness of breath.   Cardiovascular:  Negative for chest pain, palpitations and leg swelling.  Gastrointestinal:  Negative for abdominal pain, constipation, diarrhea, nausea and vomiting.  Genitourinary:  Positive for frequency and urgency. Negative for dysuria.  Neurological:  Negative for dizziness, focal weakness and headaches.  Psychiatric/Behavioral:  Negative for depression. The patient is not nervous/anxious.        Objective:    Physical  Exam Constitutional:  General: She is not in acute distress.    Appearance: She is not ill-appearing.  HENT:     Mouth/Throat:     Mouth: Mucous membranes are moist.  Eyes:     Extraocular Movements: Extraocular movements intact.     Conjunctiva/sclera: Conjunctivae normal.  Cardiovascular:     Rate and Rhythm: Normal rate and regular rhythm.  Pulmonary:     Effort: Pulmonary effort is normal.     Breath sounds: Normal breath sounds.  Musculoskeletal:     Cervical back: Normal range of motion and neck supple.     Right lower leg: No edema.     Left lower leg: No edema.  Skin:    General: Skin is warm and dry.  Neurological:     General: No focal deficit present.     Mental Status: She is alert and oriented to person, place, and time.     Motor: No weakness.     Coordination: Coordination normal.     Gait: Gait normal.  Psychiatric:        Mood and Affect: Mood normal.        Behavior: Behavior normal.        Thought Content: Thought content normal.      BP 134/76 (BP Location: Left Arm, Patient Position: Sitting)   Pulse 63   Temp 97.8 F (36.6 C) (Temporal)   Ht 5\' 3"  (1.6 m)   Wt 148 lb (67.1 kg)   SpO2 98%   BMI 26.22 kg/m  Wt Readings from Last 3 Encounters:  09/26/23 148 lb (67.1 kg)  08/29/23 149 lb (67.6 kg)  06/05/23 157 lb (71.2 kg)       Assessment & Plan:   Problem List Items Addressed This Visit     Fatigue   Overactive bladder   Relevant Medications   oxybutynin  (DITROPAN -XL) 10 MG 24 hr tablet   Urinary incontinence   Relevant Medications   oxybutynin  (DITROPAN -XL) 10 MG 24 hr tablet   Other Visit Diagnoses       Iron  deficiency anemia, unspecified iron  deficiency anemia type    -  Primary   Relevant Orders   CBC with Differential/Platelet (Completed)   Ferritin (Completed)   Fecal occult blood, imunochemical     Obstructive sleep apnea on CPAP         High serum vitamin D            She has stopped vitamin D  and will not  resume until we recheck her vitamin D  level in 3 months.  IDA- she started taking OTC oral iron . Discussed that it will take several months to show significant improvement in her labs. No sign of bleeding.  Colonoscopy in 03/2023 and negative for polyps.  Stool test ordered along with CBC, ferritin Using CPAP for OSA Unclear etiology for fatigue. Most likely multifactorial.  Refill oxybutynin  for OAB and she has been referred to urogyn for incontinence    I am having Sheryle Donning B. Figgs maintain her Cyanocobalamin  (VITAMIN B-12 PO), acetaminophen , aspirin , Cholecalciferol  (VITAMIN D -3 PO), dexamethasone , CALCIUM  MAGNESIUM  ZINC PO, atenolol , cloNIDine , metFORMIN , minoxidil , olmesartan , rosuvastatin , sertraline , verapamil , and oxybutynin .  Meds ordered this encounter  Medications   oxybutynin  (DITROPAN -XL) 10 MG 24 hr tablet    Sig: Take 1 tablet (10 mg total) by mouth daily.    Dispense:  30 tablet    Refill:  1

## 2023-09-29 ENCOUNTER — Telehealth: Payer: Self-pay | Admitting: Family Medicine

## 2023-09-29 ENCOUNTER — Encounter: Payer: Self-pay | Admitting: Family Medicine

## 2023-09-29 NOTE — Telephone Encounter (Signed)
 Copied from CRM [id number]. Topic: Clinical - Lab/Test Results >> Sep 29, 2023  9:44 AM Elle L wrote: Reason for CRM: The patient called for her lab results. I read the note to her verbatim and she expressed understanding. However, she is requesting to see if a stool kit is ready for her to pick up as they did not have any available when she was there for her appointment.

## 2023-09-30 NOTE — Telephone Encounter (Signed)
 Apparently our lab does not have the fecal occult test and was unable to provide one to pt, do we need to cancel order something else?

## 2023-09-30 NOTE — Telephone Encounter (Signed)
 Copied from CRM [id number]. Topic: General - Other >> Sep 30, 2023  2:40 PM Michele [name] wrote: Reason for CRM: Patient called in regarding stool kit , would like a callback on when she ca come in and get that

## 2023-10-01 NOTE — Telephone Encounter (Signed)
 Called lab downstairs and confirmed they do have kit, will call pt and inform her to come pick up from lab.  ATC pt x4 times but other line was silent each time pt picked up.

## 2023-10-01 NOTE — Telephone Encounter (Signed)
 Called pt cell, advised her I spoke w lab and confirmed w them that they do have it. Asked pt to stop by and pick up kit but pt states she lives out of town so it will prob not be until first of the week until she comes back to get it. Told pt that would be ok and if they try to tell her the same thing as last time to ask for me, Alisa App and I will come down to help. Pt verbalized understanding

## 2023-10-09 ENCOUNTER — Other Ambulatory Visit: Payer: Self-pay | Admitting: Podiatry

## 2023-10-10 ENCOUNTER — Encounter: Payer: Self-pay | Admitting: Podiatry

## 2023-10-15 ENCOUNTER — Ambulatory Visit: Payer: Self-pay | Admitting: Family Medicine

## 2023-10-15 ENCOUNTER — Ambulatory Visit (INDEPENDENT_AMBULATORY_CARE_PROVIDER_SITE_OTHER)

## 2023-10-15 ENCOUNTER — Other Ambulatory Visit: Payer: Self-pay | Admitting: Family Medicine

## 2023-10-15 ENCOUNTER — Telehealth: Payer: Self-pay

## 2023-10-15 DIAGNOSIS — D509 Iron deficiency anemia, unspecified: Secondary | ICD-10-CM

## 2023-10-15 DIAGNOSIS — R195 Other fecal abnormalities: Secondary | ICD-10-CM

## 2023-10-15 LAB — FECAL OCCULT BLOOD, IMMUNOCHEMICAL: Fecal Occult Bld: POSITIVE — AB

## 2023-10-15 MED ORDER — ITRACONAZOLE 100 MG PO CAPS: 200.0000 mg | ORAL_CAPSULE | Freq: Two times a day (BID) | ORAL | 0 refills | Status: DC

## 2023-10-15 NOTE — Telephone Encounter (Signed)
 PA request received from covermymeds for Itraconazole  100 mg capsules. PA submitted and awaiting response.  Michele [name]  (Key: Heyward Loud) PA Case ID #: [id number] Rx #: [id number]

## 2023-10-15 NOTE — Progress Notes (Signed)
 Her stool test is positive for blood. She should schedule a follow up her gastroenterologist and let me know if she has any issues getting an appointment. He last colonoscopy was in 03/2023.

## 2023-10-15 NOTE — Telephone Encounter (Signed)
 PA was approved 10/15/23-01/15/24

## 2023-10-21 ENCOUNTER — Encounter: Payer: Self-pay | Admitting: Physician Assistant

## 2023-10-21 ENCOUNTER — Ambulatory Visit: Admitting: Physician Assistant

## 2023-10-21 ENCOUNTER — Telehealth: Payer: Self-pay

## 2023-10-21 VITALS — BP 130/64 | HR 67 | Ht 63.0 in | Wt 145.8 lb

## 2023-10-21 DIAGNOSIS — D509 Iron deficiency anemia, unspecified: Secondary | ICD-10-CM

## 2023-10-21 DIAGNOSIS — R195 Other fecal abnormalities: Secondary | ICD-10-CM

## 2023-10-21 NOTE — Telephone Encounter (Signed)
 Patient called and left a message. She received the approval letter for itraconazole  from her insurance company - please send Rx to pharmacy

## 2023-10-21 NOTE — Progress Notes (Signed)
 Noted

## 2023-10-21 NOTE — Patient Instructions (Signed)
 You have been scheduled for an endoscopy. Please follow written instructions given to you at your visit today.  If you use inhalers (even only as needed), please bring them with you on the day of your procedure.  If you take any of the following medications, they will need to be adjusted prior to your procedure:   DO NOT TAKE 7 DAYS PRIOR TO TEST- Trulicity (dulaglutide) Ozempic, Wegovy (semaglutide) Mounjaro (tirzepatide) Bydureon Bcise (exanatide extended release)  DO NOT TAKE 1 DAY PRIOR TO YOUR TEST Rybelsus (semaglutide) Adlyxin (lixisenatide) Victoza (liraglutide) Byetta (exanatide) ___________________________________________________________________________  _______________________________________________________  If your blood pressure at your visit was 140/90 or greater, please contact your primary care physician to follow up on this.  _______________________________________________________  If you are age 18 or older, your body mass index should be between 23-30. Your Body mass index is 25.83 kg/m. If this is out of the aforementioned range listed, please consider follow up with your Primary Care Provider.  If you are age 46 or younger, your body mass index should be between 19-25. Your Body mass index is 25.83 kg/m. If this is out of the aformentioned range listed, please consider follow up with your Primary Care Provider.   ________________________________________________________  The [facility] providers would like to encourage you to use MYCHART to communicate with providers for non-urgent requests or questions.  Due to long hold times on the telephone, sending your provider a message by [facility] may be a faster and more efficient way to get a response.  Please allow 48 business hours for a response.  Please remember that this is for non-urgent requests.  _______________________________________________________

## 2023-10-21 NOTE — Progress Notes (Signed)
 Brigitte Canard, PA-C [address] [place], [place]  [address] Phone: [phone]   Primary Care Physician: Abram Abraham, NP-C  Primary Gastroenterologist:  Brigitte Canard, PA-C / Legrand Puma, MD   Chief Complaint: Iron  deficiency anemia, Heme Positive stool      HPI:   Michele [name] is a 78 y.o. female is here to evaluate mild iron  deficiency anemia and Hemoccult positive stool.  Established patient Dr. Elvin Hammer.  09/26/2023: Hemoglobin 11.9, MCV 75, low ferritin 6.0 08/29/2023: Hemoglobin 11.4, MCV 73.6, low ferritin 5.3, normal B12 of 693. 04/24/2023: Hemoglobin 12.1, MCV 79, normal ferritin 79  She is currently on iron  tablet once daily.  Current symptoms: She has mild constipation since starting iron .  Takes Senokot OTC which helps.  Stools have been mildly dark since starting iron .  She denies hematochezia or melena.  Denies abdominal pain or dysphagia.  Rarely has episode of heartburn.  She has had some mild weight loss.  She has had several teeth removed with new partials made.  It it has been hard for her to eat or chew well.  She has been on 325 mg aspirin  for many years.  Aspirin  will was recently decreased to 81 mg daily.  She denies any NSAID use.  Occasionally takes Tylenol  as needed.  She does not donate blood.  03/2023 colonoscopy by Dr. Elvin Hammer: Good prep.  Normal colonoscopy.  No polyps.  02/2018 colonoscopy: Excellent prep.  2 small polyps removed.  04/2008 EGD: Normal.  Current Outpatient Medications  Medication Sig Dispense Refill   acetaminophen  (TYLENOL ) 500 MG tablet Take 500 mg by mouth 3 (three) times daily as needed for moderate pain.     aspirin  EC 81 MG tablet Take 81 mg by mouth daily. Swallow whole.     atenolol  (TENORMIN ) 100 MG tablet Take 1 tablet every morning for BP 90 tablet 0   CALCIUM  MAGNESIUM  ZINC PO Take by mouth.     cloNIDine  (CATAPRES ) 0.2 MG tablet TAKE 1 TABLET BY MOUTH THREE TIMES DAILY AS NEEDED IF  BLOOD  PRESSURE  IS  OVER   160 270 tablet 0   Cyanocobalamin  (VITAMIN B-12 PO) Take 1 tablet by mouth daily. 5000 mc one time in the AM PRN     ferrous sulfate 325 (65 FE) MG tablet Take 325 mg by mouth daily with breakfast.     metFORMIN  (GLUCOPHAGE -XR) 500 MG 24 hr tablet Take  2 tablets   2 x /day   with Meals  for Diabetes 360 tablet 0   minoxidil  (LONITEN ) 10 MG tablet Take 1 tablet daily for BP 90 tablet 3   olmesartan  (BENICAR ) 40 MG tablet TAKE 1 TABLET BY MOUTH IN THE EVENING FOR BLOOD PRESSURE 90 tablet 0   oxybutynin  (DITROPAN -XL) 10 MG 24 hr tablet Take 1 tablet (10 mg total) by mouth daily. 30 tablet 1   rosuvastatin  (CRESTOR ) 20 MG tablet TAKE 1 TABLET BY MOUTH IN THE MORNING 90 tablet 0   sertraline  (ZOLOFT ) 100 MG tablet TAKE 1 & 1/2 (ONE & ONE-HALF) TABLETS BY MOUTH ONCE DAILY 45 tablet 0   verapamil  (CALAN -SR) 240 MG CR tablet TAKE 1 TABLET BY MOUTH IN THE EVENING WITH FOOD 90 tablet 0   No current facility-administered medications for this visit.    Allergies as of 10/21/2023 - Review Complete 10/21/2023  Allergen Reaction Noted   Lipitor [atorvastatin]  06/26/2013    Past Medical History:  Diagnosis Date   Anemia  Anxiety    Arthritis    Carpal tunnel syndrome    Cataract    bilateral,removed   Cerebrovascular disease 01/02/2017   Duodenal diverticulum    Elevated uric acid in blood    Esophageal motility disorder    Fatty liver    GERD (gastroesophageal reflux disease)    Hyperlipidemia    Hypertension    Lymphocytic colitis 09/22/2012   Sleep apnea    Tubulovillous adenoma of colon    Type II or unspecified type diabetes mellitus without mention of complication, not stated as uncontrolled     Past Surgical History:  Procedure Laterality Date   APPENDECTOMY     BREAST BIOPSY Right    CARPAL TUNNEL RELEASE Right 1992   cataract extration     COLONOSCOPY     CYST REMOVAL HAND     TONSILLECTOMY  1952    Review of Systems:    All systems reviewed and negative except  where noted in HPI.    Physical Exam:  BP 130/64   Pulse 67   Ht 5\' 3"  (1.6 m)   Wt 145 lb 12.8 oz (66.1 kg)   BMI 25.83 kg/m  No LMP recorded. Patient is postmenopausal.  General: Well-nourished, well-developed in no acute distress.  Lungs: Clear to auscultation bilaterally. Non-labored. Heart: Regular rate and rhythm, no murmurs rubs or gallops.  Abdomen: Bowel sounds are normal; Abdomen is Soft; No hepatosplenomegaly, masses or hernias;  No Abdominal Tenderness; No guarding or rebound tenderness. Neuro: Alert and oriented x 3.  Grossly intact.  Psych: Alert and cooperative, normal mood and affect.   Imaging Studies: No results found.  Labs: CBC    Component Value Date/Time   WBC 5.1 09/26/2023 1142   RBC 4.83 09/26/2023 1142   HGB 11.9 (L) 09/26/2023 1142   HCT 36.2 09/26/2023 1142   PLT 161.0 09/26/2023 1142   MCV 75.0 (L) 09/26/2023 1142   MCH 25.6 (L) 04/24/2023 1529   MCHC 32.8 09/26/2023 1142   RDW 20.0 (H) 09/26/2023 1142   LYMPHSABS 1.7 09/26/2023 1142   MONOABS 0.7 09/26/2023 1142   EOSABS 0.2 09/26/2023 1142   BASOSABS 0.0 09/26/2023 1142    CMP     Component Value Date/Time   NA 140 08/29/2023 1149   K 4.1 08/29/2023 1149   CL 102 08/29/2023 1149   CO2 28 08/29/2023 1149   GLUCOSE 74 08/29/2023 1149   BUN 12 08/29/2023 1149   CREATININE 0.93 08/29/2023 1149   CREATININE 1.09 (H) 04/24/2023 1529   CALCIUM  9.9 08/29/2023 1149   PROT 7.4 08/29/2023 1149   ALBUMIN 4.6 08/29/2023 1149   AST 27 08/29/2023 1149   ALT 18 08/29/2023 1149   ALKPHOS 76 08/29/2023 1149   BILITOT 0.6 08/29/2023 1149   GFRNONAA >60 06/12/2022 0437   GFRNONAA 68 09/28/2020 1021   GFRAA 79 09/28/2020 1021       Assessment and Plan:   Michele [name] is a 78 y.o. y/o female is referred to evaluate:  1.  Iron  deficiency anemia, mild, improving on oral iron .  Uncertain etiology. 2.  Heme Positive Stool.  No visible bleeding.   Normal colonoscopy 03/2023.  Last EGD  was normal in 2009.  She denies NSAID use or any GI symptoms.  She has taken 325mg  aspirin  for years - recently decreased to 81mg .  Scheduling EGD to evaluate for gastritis, AVMs, or other sources of IDA.    Plan: - Scheduling updated EGD I discussed risks of  EGD with patient to include risk of bleeding, perforation, and risk of sedation.  Patient expressed understanding and agrees to proceed with EGD.  - If EGD is unrevealing, then schedule capsule endoscopy - Continue to avoid all NSAIDS. - Continue OTC Senokot to treat mild constipation on iron .  Brigitte Canard, PA-C  Follow up 4 weeks after EGD with TG.

## 2023-10-22 ENCOUNTER — Other Ambulatory Visit: Payer: Self-pay | Admitting: Podiatry

## 2023-10-22 MED ORDER — ITRACONAZOLE 100 MG PO CAPS: 200.0000 mg | ORAL_CAPSULE | Freq: Two times a day (BID) | ORAL | 0 refills | Status: DC

## 2023-10-22 NOTE — Progress Notes (Signed)
 Rx Itraconazole  sent to patient pharmacy for toenail antifungal therapy.

## 2023-10-24 ENCOUNTER — Ambulatory Visit

## 2023-10-24 VITALS — Ht 63.0 in | Wt 147.6 lb

## 2023-10-24 DIAGNOSIS — Z0000 Encounter for general adult medical examination without abnormal findings: Secondary | ICD-10-CM

## 2023-10-24 NOTE — Progress Notes (Signed)
 Subjective:   Michele [name] is a 78 y.o. who presents for a Medicare Wellness preventive visit.  As a reminder, Annual Wellness Visits don't include a physical exam, and some assessments may be limited, especially if this visit is performed virtually. We may recommend an in-person follow-up visit with your provider if needed.  Visit Complete: In person  Persons Participating in Visit: Patient.  AWV Questionnaire: No: Patient Medicare AWV questionnaire was not completed prior to this visit.  Cardiac Risk Factors include: advanced age (>66men, >28 women);hypertension;diabetes mellitus;Other (see comment);dyslipidemia, Risk factor comments: OSA, CKD stage 2, Fatty liver,  Cerebrovascular disease     Objective:     Today's Vitals   10/24/23 1602  Weight: 147 lb 9.6 oz (67 kg)  Height: 5\' 3"  (1.6 m)   Body mass index is 26.15 kg/m.     10/24/2023    3:57 PM 11/01/2022   10:11 AM 06/10/2022    2:06 PM 07/05/2021   11:08 AM 12/28/2020   10:55 AM 11/02/2019    2:33 PM 11/02/2019    2:12 PM  Advanced Directives  Does Patient Have a Medical Advance Directive? Yes Yes No Yes Yes Yes Yes  Type of Estate agent of [place];Living will Healthcare Power of [place];Living will  Healthcare Power of [place];Living will Healthcare Power of [place];Living will Healthcare Power of [place];Living will Healthcare Power of [place];Living will  Does patient want to make changes to medical advance directive? No - Patient declined No - Patient declined  No - Patient declined No - Patient declined No - Patient declined   Copy of Healthcare Power of Attorney in Chart? Yes - validated most recent copy scanned in chart (See row information) No - copy requested  No - copy requested No - copy requested Yes - validated most recent copy scanned in chart (See row information) No - copy requested  Would patient like information on creating a medical advance directive?   No - Patient declined         Current Medications (verified) Outpatient Encounter Medications as of 10/24/2023  Medication Sig   acetaminophen  (TYLENOL ) 500 MG tablet Take 500 mg by mouth 3 (three) times daily as needed for moderate pain.   aspirin  EC 81 MG tablet Take 81 mg by mouth daily. Swallow whole.   atenolol  (TENORMIN ) 100 MG tablet Take 1 tablet every morning for BP   clobetasol cream (TEMOVATE) 0.05 % SMARTSIG:sparingly Topical Twice Daily   cloNIDine  (CATAPRES ) 0.2 MG tablet TAKE 1 TABLET BY MOUTH THREE TIMES DAILY AS NEEDED IF  BLOOD  PRESSURE  IS  OVER  160   Cyanocobalamin  (VITAMIN B-12 PO) Take 1 tablet by mouth daily. 5000 mc one time in the AM PRN   ferrous sulfate 325 (65 FE) MG tablet Take 325 mg by mouth daily with breakfast.   itraconazole  (SPORANOX ) 100 MG capsule Take 2 capsules (200 mg total) by mouth 2 (two) times daily. Take 2 capsules by mouth twice daily for 7 days, then stop for three weeks.   Repeat this for two additional months.   metFORMIN  (GLUCOPHAGE -XR) 500 MG 24 hr tablet Take  2 tablets   2 x /day   with Meals  for Diabetes   minoxidil  (LONITEN ) 10 MG tablet Take 1 tablet daily for BP   olmesartan  (BENICAR ) 40 MG tablet TAKE 1 TABLET BY MOUTH IN THE EVENING FOR BLOOD PRESSURE   oxybutynin  (DITROPAN -XL) 10 MG 24 hr tablet Take 1 tablet (10 mg total)  by mouth daily.   rosuvastatin  (CRESTOR ) 20 MG tablet TAKE 1 TABLET BY MOUTH IN THE MORNING   sertraline  (ZOLOFT ) 100 MG tablet TAKE 1 & 1/2 (ONE & ONE-HALF) TABLETS BY MOUTH ONCE DAILY   verapamil  (CALAN -SR) 240 MG CR tablet TAKE 1 TABLET BY MOUTH IN THE EVENING WITH FOOD   CALCIUM  MAGNESIUM  ZINC PO Take by mouth. (Patient not taking: Reported on 10/24/2023)   No facility-administered encounter medications on file as of 10/24/2023.    Allergies (verified) Lipitor [atorvastatin]   History: Past Medical History:  Diagnosis Date   Anemia    Anxiety    Arthritis    Carpal tunnel syndrome    Cataract    bilateral,removed    Cerebrovascular disease 01/02/2017   Duodenal diverticulum    Elevated uric acid in blood    Esophageal motility disorder    Fatty liver    GERD (gastroesophageal reflux disease)    Hyperlipidemia    Hypertension    Lymphocytic colitis 09/22/2012   Sleep apnea    Tubulovillous adenoma of colon    Type II or unspecified type diabetes mellitus without mention of complication, not stated as uncontrolled    Yeast detected    Rt big toe   Past Surgical History:  Procedure Laterality Date   APPENDECTOMY     BREAST BIOPSY Right    CARPAL TUNNEL RELEASE Right 1992   cataract extration     COLONOSCOPY     CYST REMOVAL HAND     TONSILLECTOMY  1952   Family History  Problem Relation Age of Onset   Heart disease Mother    Diabetes Mother    Heart disease Father    Diabetes Father    Heart attack Father    Cerebral aneurysm Sister    Heart disease Brother    Heart attack Brother    Heart disease Brother        Stent   Colon cancer Neg Hx    Esophageal cancer Neg Hx    Stomach cancer Neg Hx    Rectal cancer Neg Hx    Breast cancer Neg Hx    Sleep apnea Neg Hx    Colon polyps Neg Hx    Crohn's disease Neg Hx    Ulcerative colitis Neg Hx    Social History   Socioeconomic History   Marital status: Married    Spouse name: Rufina Cough   Number of children: 1   Years of education: 12   Highest education level: Not on file  Occupational History   Occupation: Retired    Associate Professor: [employer]  Tobacco Use   Smoking status: Never   Smokeless tobacco: Never  Vaping Use   Vaping status: Never Used  Substance and Sexual Activity   Alcohol use: No   Drug use: No   Sexual activity: Not Currently    Partners: Male    Comment: Married  Other Topics Concern   Not on file  Social History Narrative   Lives with husband.  Independent of ADLs. 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   Right-handed   Caffeine: about 1 cup of coffee per day, tea and Cokes a few  times per week   Social Drivers of Health   Financial Resource Strain: Low Risk  (10/24/2023)   Overall Financial Resource Strain (CARDIA)    Difficulty of Paying Living Expenses: Not hard at all  Food Insecurity: No Food Insecurity (10/24/2023)   Hunger Vital Sign  Worried About Programme researcher, broadcasting/film/video in the Last Year: Never true    Ran Out of Food in the Last Year: Never true  Transportation Needs: No Transportation Needs (10/24/2023)   PRAPARE - Administrator, Civil Service (Medical): No    Lack of Transportation (Non-Medical): No  Physical Activity: Inactive (10/24/2023)   Exercise Vital Sign    Days of Exercise per Week: 0 days    Minutes of Exercise per Session: 0 min  Stress: Stress Concern Present (10/24/2023)   [employer] of Occupational Health - Occupational Stress Questionnaire    Feeling of Stress : Very much  Social Connections: Socially Integrated (10/24/2023)   Social Connection and Isolation Panel [NHANES]    Frequency of Communication with Friends and Family: More than three times a week    Frequency of Social Gatherings with Friends and Family: More than three times a week    Attends Religious Services: More than 4 times per year    Active Member of [employer] or Organizations: Yes    Attends Banker Meetings: 1 to 4 times per year    Marital Status: Married    Tobacco Counseling Counseling given: Not Answered    Clinical Intake:  Pre-visit preparation completed: Yes  Pain : No/denies pain     BMI - recorded: 26.15 Nutritional Status: BMI 25 -29 Overweight Nutritional Risks: None Diabetes: Yes CBG done?: Yes (80-fasting-per pt) CBG resulted in Enter/ Edit results?: No Did pt. bring in CBG monitor from home?: No  Lab Results  Component Value Date   HGBA1C 6.6 (H) 08/29/2023   HGBA1C 6.2 (H) 02/06/2023   HGBA1C 6.2 (H) 11/01/2022     How often do you need to have someone help you when you read instructions, pamphlets, or  other written materials from your doctor or pharmacy?: 3 - Sometimes  Interpreter Needed?: No  Information entered by :: Claude Swendsen, RMA   Activities of Daily Living     10/24/2023    3:56 PM 04/27/2023    1:04 AM  In your present state of health, do you have any difficulty performing the following activities:  Hearing? 0 0  Vision? 0 0  Difficulty concentrating or making decisions? 0 0  Walking or climbing stairs? 0 0  Dressing or bathing? 0 0  Doing errands, shopping? 0 0  Comment Still driving but husband drives most of the time   Preparing Food and eating ? N   Using the Toilet? N   In the past six months, have you accidently leaked urine? N   Do you have problems with loss of bowel control? N   Managing your Medications? N   Managing your Finances? N   Housekeeping or managing your Housekeeping? N     Patient Care Team: Abram Abraham, NP-C as PCP - General (Family Medicine) Alonza Arthurs, [facility] (Inactive) as Pharmacist (Pharmacist)  Indicate any recent Medical Services you may have received from other than [facility] providers in the past year (date may be approximate).     Assessment:    This is a routine wellness examination for Carlynn.  Hearing/Vision screen Hearing Screening - Comments:: Denies hearing difficulties   Vision Screening - Comments:: Wears eyeglasses/Dr. Juanito Norma   Goals Addressed   None    Depression Screen     10/24/2023    4:15 PM 08/29/2023   11:02 AM 08/29/2023   11:01 AM 04/27/2023    1:04 AM 02/05/2023   11:00 PM 11/01/2022  10:23 AM 07/05/2021   11:20 AM  PHQ 2/9 Scores  PHQ - 2 Score 0 0 0 0 0 1 1  PHQ- 9 Score 3 6         Fall Risk     10/24/2023    4:10 PM 08/29/2023   11:00 AM 04/27/2023    1:04 AM 02/05/2023   11:00 PM 11/01/2022   10:12 AM  Fall Risk   Falls in the past year? 1 1 0 0 0  Number falls in past yr: 0 0   0  Injury with Fall? 0 1   0  Comment brusied rt leg      Risk for fall due to : Impaired  balance/gait;Medication side effect No Fall Risks No Fall Risks No Fall Risks Orthopedic patient  Follow up Falls evaluation completed;Falls prevention discussed Falls evaluation completed Falls prevention discussed;Education provided;Falls evaluation completed Falls evaluation completed;Education provided;Falls prevention discussed Falls evaluation completed;Falls prevention discussed    MEDICARE RISK AT HOME:  Medicare Risk at Home Any stairs in or around the home?: Yes (outside) If so, are there any without handrails?: Yes Home free of loose throw rugs in walkways, pet beds, electrical cords, etc?: Yes Adequate lighting in your home to reduce risk of falls?: Yes Life alert?: No Use of a cane, walker or w/c?: No Grab bars in the bathroom?: Yes Shower chair or bench in shower?: Yes Elevated toilet seat or a handicapped toilet?: Yes  TIMED UP AND GO:  Was the test performed?  Yes  Length of time to ambulate 10 feet: 15 sec Gait steady and fast without use of assistive device  Cognitive Function: Declined/Normal: No cognitive concerns noted by patient or family. Patient alert, oriented, able to answer questions appropriately and recall recent events. No signs of memory loss or confusion.        Immunizations Immunization History  Administered Date(s) Administered   Influenza Split 03/25/2013   Influenza, High Dose Seasonal PF 03/15/2014, 03/07/2015, 03/27/2016, 04/21/2017, 02/18/2018, 03/11/2019, 03/20/2020, 03/30/2021, 04/18/2022, 03/20/2023   PFIZER Comirnaty(Gray Top)Covid-19 Tri-Sucrose Vaccine 04/20/2022   PFIZER(Purple Top)SARS-COV-2 Vaccination 06/24/2019, 07/15/2019, 03/08/2020   Pneumococcal Conjugate-13 01/26/2014   Pneumococcal Polysaccharide-23 03/14/2011   Respiratory Syncytial Virus Vaccine,Recomb Aduvanted(Arexvy) 04/20/2022   Td 03/25/2013, 02/06/2023   Unspecified SARS-COV-2 Vaccination 04/04/2023   Zoster Recombinant(Shingrix) 02/07/2020   Zoster, Live  06/03/2005   Zoster, Unspecified 09/20/2023    Screening Tests Health Maintenance  Topic Date Due   COVID-19 Vaccine (6 - Pfizer risk 2024-25 season) 10/02/2023   INFLUENZA VACCINE  01/02/2024   OPHTHALMOLOGY EXAM  01/28/2024   HEMOGLOBIN A1C  02/29/2024   FOOT EXAM  04/23/2024   Diabetic kidney evaluation - eGFR measurement  08/28/2024   Diabetic kidney evaluation - Urine ACR  08/28/2024   Medicare Annual Wellness (AWV)  10/23/2024   DTaP/Tdap/Td (3 - Tdap) 02/05/2033   Pneumonia Vaccine 48+ Years old  Completed   DEXA SCAN  Completed   Hepatitis C Screening  Completed   Zoster Vaccines- Shingrix  Completed   HPV VACCINES  Aged Out   Meningococcal B Vaccine  Aged Out   Colonoscopy  Discontinued    Health Maintenance  Health Maintenance Due  Topic Date Due   COVID-19 Vaccine (6 - Pfizer risk 2024-25 season) 10/02/2023   Health Maintenance Items Addressed: See Nurse Notes  Additional Screening:  Vision Screening: Recommended annual ophthalmology exams for early detection of glaucoma and other disorders of the eye.  Dental Screening: Recommended annual dental  exams for proper oral hygiene  Community Resource Referral / Chronic Care Management: CRR required this visit?  No   CCM required this visit?  No   Plan:    I have personally reviewed and noted the following in the patient's chart:   Medical and social history Use of alcohol, tobacco or illicit drugs  Current medications and supplements including opioid prescriptions. Patient is not currently taking opioid prescriptions. Functional ability and status Nutritional status Physical activity Advanced directives List of other physicians Hospitalizations, surgeries, and ER visits in previous 12 months Vitals Screenings to include cognitive, depression, and falls Referrals and appointments  In addition, I have reviewed and discussed with patient certain preventive protocols, quality metrics, and best  practice recommendations. A written personalized care plan for preventive services as well as general preventive health recommendations were provided to patient.   Catie Chiao L Weston Fulco, CMA   10/24/2023   After Visit Summary: (MyChart) Due to this being a telephonic visit, the after visit summary with patients personalized plan was offered to patient via MyChart   Notes: Please refer to Routing Comments.

## 2023-10-24 NOTE — Patient Instructions (Signed)
 Michele [name] , Thank you for taking time out of your busy schedule to complete your Annual Wellness Visit with me. I enjoyed our conversation and look forward to speaking with you again next year. I, as well as your care team,  appreciate your ongoing commitment to your health goals. Please review the following plan we discussed and let me know if I can assist you in the future. Your Game plan/ To Do List    Follow up Visits: Next Medicare AWV with our clinical staff: 10/28/2024.   Have you seen your provider in the last 6 months (3 months if uncontrolled diabetes)? Yes Next Office Visit with your provider: 12/31/2023.  Clinician Recommendations:  Aim for 30 minutes of exercise or brisk walking, 6-8 glasses of water, and 5 servings of fruits and vegetables each day.  Keep up the good work.      This is a list of the screening recommended for you and due dates:  Health Maintenance  Topic Date Due   COVID-19 Vaccine (6 - Pfizer risk 2024-25 season) 10/02/2023   Flu Shot  01/02/2024   Eye exam for diabetics  01/28/2024   Hemoglobin A1C  02/29/2024   Complete foot exam   04/23/2024   Medicare Annual Wellness Visit  04/23/2024   Yearly kidney function blood test for diabetes  08/28/2024   Yearly kidney health urinalysis for diabetes  08/28/2024   DTaP/Tdap/Td vaccine (3 - Tdap) 02/05/2033   Pneumonia Vaccine  Completed   DEXA scan (bone density measurement)  Completed   Hepatitis C Screening  Completed   Zoster (Shingles) Vaccine  Completed   HPV Vaccine  Aged Out   Meningitis B Vaccine  Aged Out   Colon Cancer Screening  Discontinued    Advanced directives: (In Chart) A copy of your advanced directives are scanned into your chart should your provider ever need it. Advance Care Planning is important because it:  [x]  Makes sure you receive the medical care that is consistent with your values, goals, and preferences  [x]  It provides guidance to your family and loved ones and reduces  their decisional burden about whether or not they are making the right decisions based on your wishes.  Follow the link provided in your after visit summary or read over the paperwork we have mailed to you to help you started getting your Advance Directives in place. If you need assistance in completing these, please reach out to us  so that we can help you!  See attachments for Preventive Care and Fall Prevention Tips.

## 2023-10-27 ENCOUNTER — Encounter (HOSPITAL_COMMUNITY): Payer: Self-pay | Admitting: Emergency Medicine

## 2023-10-27 ENCOUNTER — Other Ambulatory Visit: Payer: Self-pay

## 2023-10-27 ENCOUNTER — Emergency Department (HOSPITAL_COMMUNITY)
Admission: EM | Admit: 2023-10-27 | Discharge: 2023-10-28 | Disposition: A | Discharge status: Home / Self Care | Attending: Emergency Medicine | Admitting: Emergency Medicine

## 2023-10-27 DIAGNOSIS — R42 Dizziness and giddiness: Secondary | ICD-10-CM | POA: Diagnosis present

## 2023-10-27 DIAGNOSIS — E119 Type 2 diabetes mellitus without complications: Secondary | ICD-10-CM | POA: Insufficient documentation

## 2023-10-27 DIAGNOSIS — I10 Essential (primary) hypertension: Secondary | ICD-10-CM | POA: Diagnosis not present

## 2023-10-27 DIAGNOSIS — E860 Dehydration: Secondary | ICD-10-CM | POA: Insufficient documentation

## 2023-10-27 DIAGNOSIS — N179 Acute kidney failure, unspecified: Secondary | ICD-10-CM | POA: Diagnosis not present

## 2023-10-27 NOTE — ED Triage Notes (Signed)
 Pt arrive by [facility] from home with c/o dizziness, and few episodes of near syncope with blurring vision. Pt states last feeling well yesterday night prior to go to bed at 22:00 and started feeling dizzy this morning. Pt reports having some coordination/balance problem and some HA as well. Initial vitals by EMS sitting HR 44, BP 148/58, Standing HR 21, BP 123/59. VS pta to ED HR 57, BP  118/49, SPO2 96% on RA. CBG 151. Pt is AO x 4 during triage NAD noticed.

## 2023-10-28 ENCOUNTER — Emergency Department (HOSPITAL_COMMUNITY)

## 2023-10-28 LAB — COMPREHENSIVE METABOLIC PANEL WITH GFR
ALT: 21 U/L (ref 0–44)
AST: 28 U/L (ref 15–41)
Albumin: 3.7 g/dL (ref 3.5–5.0)
Alkaline Phosphatase: 51 U/L (ref 38–126)
Anion gap: 10 (ref 5–15)
BUN: 18 mg/dL (ref 8–23)
CO2: 25 mmol/L (ref 22–32)
Calcium: 9.6 mg/dL (ref 8.9–10.3)
Chloride: 103 mmol/L (ref 98–111)
Creatinine, Ser: 1.35 mg/dL — ABNORMAL HIGH (ref 0.44–1.00)
GFR, Estimated: 40 mL/min — ABNORMAL LOW (ref >60)
Glucose, Bld: 115 mg/dL — ABNORMAL HIGH (ref 70–99)
Potassium: 4.1 mmol/L (ref 3.5–5.1)
Sodium: 138 mmol/L (ref 135–145)
Total Bilirubin: 0.9 mg/dL (ref 0.0–1.2)
Total Protein: 5.8 g/dL — ABNORMAL LOW (ref 6.5–8.1)

## 2023-10-28 LAB — CBC
HCT: 32.3 % — ABNORMAL LOW (ref 36.0–46.0)
Hemoglobin: 10.5 g/dL — ABNORMAL LOW (ref 12.0–15.0)
MCH: 25.4 pg — ABNORMAL LOW (ref 26.0–34.0)
MCHC: 32.5 g/dL (ref 30.0–36.0)
MCV: 78.2 fL — ABNORMAL LOW (ref 80.0–100.0)
Platelets: 125 10*3/uL — ABNORMAL LOW (ref 150–400)
RBC: 4.13 MIL/uL (ref 3.87–5.11)
RDW: 18.4 % — ABNORMAL HIGH (ref 11.5–15.5)
WBC: 5 10*3/uL (ref 4.0–10.5)
nRBC: 0 % (ref 0.0–0.2)

## 2023-10-28 LAB — TROPONIN I (HIGH SENSITIVITY): Troponin I (High Sensitivity): 5 ng/L (ref <18)

## 2023-10-28 LAB — TSH: TSH: 1.631 u[IU]/mL (ref 0.350–4.500)

## 2023-10-28 LAB — MAGNESIUM: Magnesium: 1.9 mg/dL (ref 1.7–2.4)

## 2023-10-28 MED ORDER — SODIUM CHLORIDE 0.9 % IV BOLUS
500.0000 mL | Freq: Once | INTRAVENOUS | Status: AC
  Administered 2023-10-28: 500 mL via INTRAVENOUS

## 2023-10-28 NOTE — ED Provider Notes (Signed)
 [facility]-EMERGENCY DEPT [facility] Emergency Department Provider Note MRN:  [id number]  Arrival date & time: 10/28/23     Chief Complaint   Near Syncope   History of Present Illness   Michele [name] is a 78 y.o. year-old female with a history of diabetes presenting to the ED with chief complaint of near syncope.  Dizzy, lightheaded, feeling like she was going to pass out basically all day today.  Noticed that her heart rate was low and her blood pressure was low and her blood sugar was high.  Urine darker than normal today.  Review of Systems  A thorough review of systems was obtained and all systems are negative except as noted in the HPI and PMH.   Patient's Health History    Past Medical History:  Diagnosis Date   Anemia    Anxiety    Arthritis    Carpal tunnel syndrome    Cataract    bilateral,removed   Cerebrovascular disease 01/02/2017   Duodenal diverticulum    Elevated uric acid in blood    Esophageal motility disorder    Fatty liver    GERD (gastroesophageal reflux disease)    Hyperlipidemia    Hypertension    Lymphocytic colitis 09/22/2012   Sleep apnea    Tubulovillous adenoma of colon    Type II or unspecified type diabetes mellitus without mention of complication, not stated as uncontrolled    Yeast detected    Rt big toe    Past Surgical History:  Procedure Laterality Date   APPENDECTOMY     BREAST BIOPSY Right    CARPAL TUNNEL RELEASE Right 1992   cataract extration     COLONOSCOPY     CYST REMOVAL HAND     TONSILLECTOMY  1952    Family History  Problem Relation Age of Onset   Heart disease Mother    Diabetes Mother    Heart disease Father    Diabetes Father    Heart attack Father    Cerebral aneurysm Sister    Heart disease Brother    Heart attack Brother    Heart disease Brother        Stent   Colon cancer Neg Hx    Esophageal cancer Neg Hx    Stomach cancer Neg Hx    Rectal cancer Neg Hx    Breast cancer Neg Hx    Sleep apnea  Neg Hx    Colon polyps Neg Hx    Crohn's disease Neg Hx    Ulcerative colitis Neg Hx     Social History   Socioeconomic History   Marital status: Married    Spouse name: Rufina Cough   Number of children: 1   Years of education: 12   Highest education level: Not on file  Occupational History   Occupation: Retired    Associate Professor: [employer]  Tobacco Use   Smoking status: Never   Smokeless tobacco: Never  Vaping Use   Vaping status: Never Used  Substance and Sexual Activity   Alcohol use: No   Drug use: No   Sexual activity: Not Currently    Partners: Male    Comment: Married  Other Topics Concern   Not on file  Social History Narrative   Lives with husband.  Independent of ADLs. 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   Right-handed   Caffeine: about 1 cup of coffee per day, tea and Cokes a few times per week   Social  Drivers of Health   Financial Resource Strain: Low Risk  (10/24/2023)   Overall Financial Resource Strain (CARDIA)    Difficulty of Paying Living Expenses: Not hard at all  Food Insecurity: No Food Insecurity (10/24/2023)   Hunger Vital Sign    Worried About Running Out of Food in the Last Year: Never true    Ran Out of Food in the Last Year: Never true  Transportation Needs: No Transportation Needs (10/24/2023)   PRAPARE - Administrator, Civil Service (Medical): No    Lack of Transportation (Non-Medical): No  Physical Activity: Inactive (10/24/2023)   Exercise Vital Sign    Days of Exercise per Week: 0 days    Minutes of Exercise per Session: 0 min  Stress: Stress Concern Present (10/24/2023)   [employer] of Occupational Health - Occupational Stress Questionnaire    Feeling of Stress : Very much  Social Connections: Socially Integrated (10/24/2023)   Social Connection and Isolation Panel [NHANES]    Frequency of Communication with Friends and Family: More than three times a week    Frequency of Social Gatherings with  Friends and Family: More than three times a week    Attends Religious Services: More than 4 times per year    Active Member of [employer] or Organizations: Yes    Attends Banker Meetings: 1 to 4 times per year    Marital Status: Married  Catering manager Violence: Not At Risk (10/24/2023)   Humiliation, Afraid, Rape, and Kick questionnaire    Fear of Current or Ex-Partner: No    Emotionally Abused: No    Physically Abused: No    Sexually Abused: No     Physical Exam   Vitals:   10/28/23 0100 10/28/23 0145  BP: (!) 117/48 (!) 117/46  Pulse: (!) 58 (!) 59  Resp: 17 19  Temp:    SpO2: 97% 95%    CONSTITUTIONAL: Well-appearing, NAD NEURO/PSYCH:  Alert and oriented x 3, no focal deficits EYES:  eyes equal and reactive ENT/NECK:  no LAD, no JVD CARDIO: Regular rate, well-perfused, normal S1 and S2 PULM:  CTAB no wheezing or rhonchi GI/GU:  non-distended, non-tender MSK/SPINE:  No gross deformities, no edema SKIN:  no rash, atraumatic   *Additional and/or pertinent findings included in MDM below  Diagnostic and Interventional Summary    EKG Interpretation Date/Time:  Tuesday Oct 28 2023 00:19:50 EDT Ventricular Rate:  56 PR Interval:  196 QRS Duration:  102 QT Interval:  485 QTC Calculation: 469 R Axis:   23  Text Interpretation: Sinus rhythm Confirmed by Gwenetta Lennert [phone]) on 10/28/2023 1:19:46 AM       Labs Reviewed  CBC - Abnormal; Notable for the following components:      Result Value   Hemoglobin 10.5 (*)    HCT 32.3 (*)    MCV 78.2 (*)    MCH 25.4 (*)    RDW 18.4 (*)    Platelets 125 (*)    All other components within normal limits  COMPREHENSIVE METABOLIC PANEL WITH GFR - Abnormal; Notable for the following components:   Glucose, Bld 115 (*)    Creatinine, Ser 1.35 (*)    Total Protein 5.8 (*)    GFR, Estimated 40 (*)    All other components within normal limits  MAGNESIUM   TSH  TROPONIN I (HIGH SENSITIVITY)  TROPONIN I (HIGH  SENSITIVITY)    DG Chest Port 1 View  Final Result  Medications  sodium chloride  0.9 % bolus 500 mL (0 mLs Intravenous Stopped 10/28/23 0112)     Procedures  /  Critical Care Procedures  ED Course and Medical Decision Making  Initial Impression and Ddx Symptoms possibly related to dehydration, says she does not drink very much water, dark urine.  Denies dysuria, not having any abdominal pain, no chest pain or shortness of breath.  Symptoms sound consistent with orthostatic hypotension.  Unclear cause of the reported bradycardia, though she is on beta-blocker.  Other considerations include electrolyte disturbance, arrhythmia, thyroid  dysfunction.  Past medical/surgical history that increases complexity of ED encounter: Hypertension, diabetes  Interpretation of Diagnostics I personally reviewed the EKG and my interpretation is as follows: Sinus rhythm  Labs reveal mild elevation in creatinine, otherwise no significant blood count or electrolyte disturbance.  Troponin negative  Patient Reassessment and Ultimate Disposition/Management     Patient doing well on reassessment, able to ambulate without issue, no near syncope.  Suspect dehydration, appropriate for discharge.  Patient management required discussion with the following services or consulting groups:  None  Complexity of Problems Addressed Acute illness or injury that poses threat of life of bodily function  Additional Data Reviewed and Analyzed Further history obtained from: Further history from spouse/family member  Additional Factors Impacting ED Encounter Risk Consideration of hospitalization  Merrick Abe. Harless Lien, MD [facility] Emergency Medicine [name] [email or web address]  Final Clinical Impressions(s) / ED Diagnoses     ICD-10-CM   1. Dehydration  E86.0     2. Acute kidney injury (HCC)  N17.9       ED Discharge Orders     None        Discharge Instructions Discussed with and  Provided to Patient:     Discharge Instructions      You were evaluated in the Emergency Department and after careful evaluation, we did not find any emergent condition requiring admission or further testing in the hospital.  Your exam/testing today is overall reassuring.  Symptoms likely related to dehydration.  Increase your fluid intake as we discussed, follow-up with your primary care doctor.  Please return to the Emergency Department if you experience any worsening of your condition.   Thank you for allowing us  to be a part of your care.     Edson Graces, MD 10/28/23 Chana Comas

## 2023-10-28 NOTE — ED Notes (Signed)
 Patient discharged in stable condition, education materials explained including, follow up, any prescriptions and reasons to return. Patient voiced agreement to education and discharge material.

## 2023-10-28 NOTE — Discharge Instructions (Signed)
 You were evaluated in the Emergency Department and after careful evaluation, we did not find any emergent condition requiring admission or further testing in the hospital.  Your exam/testing today is overall reassuring.  Symptoms likely related to dehydration.  Increase your fluid intake as we discussed, follow-up with your primary care doctor.  Please return to the Emergency Department if you experience any worsening of your condition.   Thank you for allowing us  to be a part of your care.

## 2023-10-31 NOTE — Progress Notes (Signed)
 Subjective:   Michele [name] is a 78 y.o. who presents for a Medicare Wellness preventive visit.  As a reminder, Annual Wellness Visits don't include a physical exam, and some assessments may be limited, especially if this visit is performed virtually. We may recommend an in-person follow-up visit with your provider if needed.  Visit Complete: In person  Persons Participating in Visit: Patient.  AWV Questionnaire: No: Patient Medicare AWV questionnaire was not completed prior to this visit.  This was one that I had to write it on her KPN, as I was going back to add it to her chart due to staying on time with next patient.  It was 130/70/ Lt arm, Heart rate was 57 and O2 was 100.   Cardiac Risk Factors include: advanced age (>28men, >10 women);hypertension;diabetes mellitus;Other (see comment);dyslipidemia, Risk factor comments: OSA, CKD stage 2, Fatty liver,  Cerebrovascular disease     Objective:     Today's Vitals   10/24/23 1602  Weight: 147 lb 9.6 oz (67 kg)  Height: 5\' 3"  (1.6 m)   Body mass index is 26.15 kg/m.     10/24/2023    3:57 PM 11/01/2022   10:11 AM 06/10/2022    2:06 PM 07/05/2021   11:08 AM 12/28/2020   10:55 AM 11/02/2019    2:33 PM 11/02/2019    2:12 PM  Advanced Directives  Does Patient Have a Medical Advance Directive? Yes Yes No Yes Yes Yes Yes  Type of Estate agent of [place];Living will Healthcare Power of [place];Living will  Healthcare Power of [place];Living will Healthcare Power of [place];Living will Healthcare Power of [place];Living will Healthcare Power of [place];Living will  Does patient want to make changes to medical advance directive? No - Patient declined No - Patient declined  No - Patient declined No - Patient declined No - Patient declined   Copy of Healthcare Power of Attorney in Chart? Yes - validated most recent copy scanned in chart (See row information) No - copy requested  No - copy requested No - copy  requested Yes - validated most recent copy scanned in chart (See row information) No - copy requested  Would patient like information on creating a medical advance directive?   No - Patient declined        Current Medications (verified) Outpatient Encounter Medications as of 10/24/2023  Medication Sig   acetaminophen  (TYLENOL ) 500 MG tablet Take 500 mg by mouth 3 (three) times daily as needed for moderate pain.   aspirin  EC 81 MG tablet Take 81 mg by mouth daily. Swallow whole.   atenolol  (TENORMIN ) 100 MG tablet Take 1 tablet every morning for BP   clobetasol cream (TEMOVATE) 0.05 % SMARTSIG:sparingly Topical Twice Daily   cloNIDine  (CATAPRES ) 0.2 MG tablet TAKE 1 TABLET BY MOUTH THREE TIMES DAILY AS NEEDED IF  BLOOD  PRESSURE  IS  OVER  160   Cyanocobalamin  (VITAMIN B-12 PO) Take 1 tablet by mouth daily. 5000 mc one time in the AM PRN   ferrous sulfate 325 (65 FE) MG tablet Take 325 mg by mouth daily with breakfast.   itraconazole  (SPORANOX ) 100 MG capsule Take 2 capsules (200 mg total) by mouth 2 (two) times daily. Take 2 capsules by mouth twice daily for 7 days, then stop for three weeks.   Repeat this for two additional months.   metFORMIN  (GLUCOPHAGE -XR) 500 MG 24 hr tablet Take  2 tablets   2 x /day   with Meals  for  Diabetes   minoxidil  (LONITEN ) 10 MG tablet Take 1 tablet daily for BP   olmesartan  (BENICAR ) 40 MG tablet TAKE 1 TABLET BY MOUTH IN THE EVENING FOR BLOOD PRESSURE   oxybutynin  (DITROPAN -XL) 10 MG 24 hr tablet Take 1 tablet (10 mg total) by mouth daily.   rosuvastatin  (CRESTOR ) 20 MG tablet TAKE 1 TABLET BY MOUTH IN THE MORNING   sertraline  (ZOLOFT ) 100 MG tablet TAKE 1 & 1/2 (ONE & ONE-HALF) TABLETS BY MOUTH ONCE DAILY   verapamil  (CALAN -SR) 240 MG CR tablet TAKE 1 TABLET BY MOUTH IN THE EVENING WITH FOOD   CALCIUM  MAGNESIUM  ZINC PO Take by mouth. (Patient not taking: Reported on 10/24/2023)   No facility-administered encounter medications on file as of 10/24/2023.     Allergies (verified) Lipitor [atorvastatin]   History: Past Medical History:  Diagnosis Date   Anemia    Anxiety    Arthritis    Carpal tunnel syndrome    Cataract    bilateral,removed   Cerebrovascular disease 01/02/2017   Duodenal diverticulum    Elevated uric acid in blood    Esophageal motility disorder    Fatty liver    GERD (gastroesophageal reflux disease)    Hyperlipidemia    Hypertension    Lymphocytic colitis 09/22/2012   Sleep apnea    Tubulovillous adenoma of colon    Type II or unspecified type diabetes mellitus without mention of complication, not stated as uncontrolled    Yeast detected    Rt big toe   Past Surgical History:  Procedure Laterality Date   APPENDECTOMY     BREAST BIOPSY Right    CARPAL TUNNEL RELEASE Right 1992   cataract extration     COLONOSCOPY     CYST REMOVAL HAND     TONSILLECTOMY  1952   Family History  Problem Relation Age of Onset   Heart disease Mother    Diabetes Mother    Heart disease Father    Diabetes Father    Heart attack Father    Cerebral aneurysm Sister    Heart disease Brother    Heart attack Brother    Heart disease Brother        Stent   Colon cancer Neg Hx    Esophageal cancer Neg Hx    Stomach cancer Neg Hx    Rectal cancer Neg Hx    Breast cancer Neg Hx    Sleep apnea Neg Hx    Colon polyps Neg Hx    Crohn's disease Neg Hx    Ulcerative colitis Neg Hx    Social History   Socioeconomic History   Marital status: Married    Spouse name: Rufina Cough   Number of children: 1   Years of education: 12   Highest education level: Not on file  Occupational History   Occupation: Retired    Associate Professor: [employer]  Tobacco Use   Smoking status: Never   Smokeless tobacco: Never  Vaping Use   Vaping status: Never Used  Substance and Sexual Activity   Alcohol use: No   Drug use: No   Sexual activity: Not Currently    Partners: Male    Comment: Married  Other Topics Concern   Not on file  Social  History Narrative   Lives with husband.  Independent of ADLs. 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   Right-handed   Caffeine: about 1 cup of coffee per day, tea and Cokes a few times per week  Social Drivers of Corporate investment banker Strain: Low Risk  (10/24/2023)   Overall Financial Resource Strain (CARDIA)    Difficulty of Paying Living Expenses: Not hard at all  Food Insecurity: No Food Insecurity (10/24/2023)   Hunger Vital Sign    Worried About Running Out of Food in the Last Year: Never true    Ran Out of Food in the Last Year: Never true  Transportation Needs: No Transportation Needs (10/24/2023)   PRAPARE - Administrator, Civil Service (Medical): No    Lack of Transportation (Non-Medical): No  Physical Activity: Inactive (10/24/2023)   Exercise Vital Sign    Days of Exercise per Week: 0 days    Minutes of Exercise per Session: 0 min  Stress: Stress Concern Present (10/24/2023)   [employer] of Occupational Health - Occupational Stress Questionnaire    Feeling of Stress : Very much  Social Connections: Socially Integrated (10/24/2023)   Social Connection and Isolation Panel [NHANES]    Frequency of Communication with Friends and Family: More than three times a week    Frequency of Social Gatherings with Friends and Family: More than three times a week    Attends Religious Services: More than 4 times per year    Active Member of [employer] or Organizations: Yes    Attends Banker Meetings: 1 to 4 times per year    Marital Status: Married    Tobacco Counseling Counseling given: Not Answered    Clinical Intake:  Pre-visit preparation completed: Yes  Pain : No/denies pain     BMI - recorded: 26.15 Nutritional Status: BMI 25 -29 Overweight Nutritional Risks: None Diabetes: Yes CBG done?: Yes (80-fasting-per pt) CBG resulted in Enter/ Edit results?: No Did pt. bring in CBG monitor from home?: No  Lab  Results  Component Value Date   HGBA1C 6.6 (H) 08/29/2023   HGBA1C 6.2 (H) 02/06/2023   HGBA1C 6.2 (H) 11/01/2022     How often do you need to have someone help you when you read instructions, pamphlets, or other written materials from your doctor or pharmacy?: 3 - Sometimes  Interpreter Needed?: No  Information entered by :: Kashus Karlen, RMA   Activities of Daily Living     10/24/2023    3:56 PM 04/27/2023    1:04 AM  In your present state of health, do you have any difficulty performing the following activities:  Hearing? 0 0  Vision? 0 0  Difficulty concentrating or making decisions? 0 0  Walking or climbing stairs? 0 0  Dressing or bathing? 0 0  Doing errands, shopping? 0 0  Comment Still driving but husband drives most of the time   Preparing Food and eating ? N   Using the Toilet? N   In the past six months, have you accidently leaked urine? N   Do you have problems with loss of bowel control? N   Managing your Medications? N   Managing your Finances? N   Housekeeping or managing your Housekeeping? N     Patient Care Team: Abram Abraham, NP-C as PCP - General (Family Medicine) Alonza Arthurs, [facility] (Inactive) as Pharmacist (Pharmacist)  Indicate any recent Medical Services you may have received from other than [facility] providers in the past year (date may be approximate).     Assessment:    This is a routine wellness examination for Keiandra.  Hearing/Vision screen Hearing Screening - Comments:: Denies hearing difficulties   Vision Screening - Comments::  Wears eyeglasses/Dr. Juanito Norma   Goals Addressed   None    Depression Screen     10/24/2023    4:15 PM 08/29/2023   11:02 AM 08/29/2023   11:01 AM 04/27/2023    1:04 AM 02/05/2023   11:00 PM 11/01/2022   10:23 AM 07/05/2021   11:20 AM  PHQ 2/9 Scores  PHQ - 2 Score 0 0 0 0 0 1 1  PHQ- 9 Score 3 6         Fall Risk     10/24/2023    4:10 PM 08/29/2023   11:00 AM 04/27/2023    1:04 AM 02/05/2023   11:00 PM  11/01/2022   10:12 AM  Fall Risk   Falls in the past year? 1 1 0 0 0  Number falls in past yr: 0 0   0  Injury with Fall? 0 1   0  Comment brusied rt leg      Risk for fall due to : Impaired balance/gait;Medication side effect No Fall Risks No Fall Risks No Fall Risks Orthopedic patient  Follow up Falls evaluation completed;Falls prevention discussed Falls evaluation completed Falls prevention discussed;Education provided;Falls evaluation completed Falls evaluation completed;Education provided;Falls prevention discussed Falls evaluation completed;Falls prevention discussed    MEDICARE RISK AT HOME:  Medicare Risk at Home Any stairs in or around the home?: Yes (outside) If so, are there any without handrails?: Yes Home free of loose throw rugs in walkways, pet beds, electrical cords, etc?: Yes Adequate lighting in your home to reduce risk of falls?: Yes Life alert?: No Use of a cane, walker or w/c?: No Grab bars in the bathroom?: Yes Shower chair or bench in shower?: Yes Elevated toilet seat or a handicapped toilet?: Yes  TIMED UP AND GO:  Was the test performed?  Yes  Length of time to ambulate 10 feet: 15 sec Gait steady and fast without use of assistive device  Cognitive Function: Declined/Normal: No cognitive concerns noted by patient or family. Patient alert, oriented, able to answer questions appropriately and recall recent events. No signs of memory loss or confusion.        Immunizations Immunization History  Administered Date(s) Administered   Influenza Split 03/25/2013   Influenza, High Dose Seasonal PF 03/15/2014, 03/07/2015, 03/27/2016, 04/21/2017, 02/18/2018, 03/11/2019, 03/20/2020, 03/30/2021, 04/18/2022, 03/20/2023   PFIZER Comirnaty(Gray Top)Covid-19 Tri-Sucrose Vaccine 04/20/2022   PFIZER(Purple Top)SARS-COV-2 Vaccination 06/24/2019, 07/15/2019, 03/08/2020   Pneumococcal Conjugate-13 01/26/2014   Pneumococcal Polysaccharide-23 03/14/2011   Respiratory  Syncytial Virus Vaccine,Recomb Aduvanted(Arexvy) 04/20/2022   Td 03/25/2013, 02/06/2023   Unspecified SARS-COV-2 Vaccination 04/04/2023   Zoster Recombinant(Shingrix) 02/07/2020   Zoster, Live 06/03/2005   Zoster, Unspecified 09/20/2023    Screening Tests Health Maintenance  Topic Date Due   COVID-19 Vaccine (6 - Pfizer risk 2024-25 season) 10/02/2023   INFLUENZA VACCINE  01/02/2024   OPHTHALMOLOGY EXAM  01/28/2024   HEMOGLOBIN A1C  02/29/2024   FOOT EXAM  04/23/2024   Diabetic kidney evaluation - Urine ACR  08/28/2024   Medicare Annual Wellness (AWV)  10/23/2024   Diabetic kidney evaluation - eGFR measurement  10/26/2024   DTaP/Tdap/Td (3 - Tdap) 02/05/2033   Pneumonia Vaccine 30+ Years old  Completed   DEXA SCAN  Completed   Hepatitis C Screening  Completed   Zoster Vaccines- Shingrix  Completed   HPV VACCINES  Aged Out   Meningococcal B Vaccine  Aged Out   Colonoscopy  Discontinued    Health Maintenance  Health Maintenance Due  Topic  Date Due   COVID-19 Vaccine (6 - Pfizer risk 2024-25 season) 10/02/2023   Health Maintenance Items Addressed: See Nurse Notes  Additional Screening:  Vision Screening: Recommended annual ophthalmology exams for early detection of glaucoma and other disorders of the eye.  Dental Screening: Recommended annual dental exams for proper oral hygiene  Community Resource Referral / Chronic Care Management: CRR required this visit?  No   CCM required this visit?  No   Plan:    I have personally reviewed and noted the following in the patient's chart:   Medical and social history Use of alcohol, tobacco or illicit drugs  Current medications and supplements including opioid prescriptions. Patient is not currently taking opioid prescriptions. Functional ability and status Nutritional status Physical activity Advanced directives List of other physicians Hospitalizations, surgeries, and ER visits in previous 12  months Vitals Screenings to include cognitive, depression, and falls Referrals and appointments  In addition, I have reviewed and discussed with patient certain preventive protocols, quality metrics, and best practice recommendations. A written personalized care plan for preventive services as well as general preventive health recommendations were provided to patient.   Gordon Carlson L Dietra Stokely, CMA   10/21/2023  After Visit Summary: (MyChart) Due to this being a telephonic visit, the after visit summary with patients personalized plan was offered to patient via MyChart   Notes: Please refer to Routing Comments.

## 2023-11-02 ENCOUNTER — Telehealth: Payer: Self-pay | Admitting: Family Medicine

## 2023-11-02 DIAGNOSIS — N3281 Overactive bladder: Secondary | ICD-10-CM

## 2023-11-07 NOTE — Telephone Encounter (Signed)
 Copied from CRM [id number]. Topic: Clinical - Medication Question >> Nov 07, 2023 10:49 AM Dewanda Foots wrote: Reason for CRM: Nure Chassity from [facility] called in to check on the status of oxybutynin  (DITROPAN -XL) 10 MG 24 hr tablet for the patient and upon review, looks like they have 1 refill left but when they called the pharmacy, they told her there were no refills left. This is the [facility] on [address]. Called the CAL and they stated they would put the request in and try to get this done for her today.

## 2023-11-07 NOTE — Telephone Encounter (Signed)
 Called [facility] pharmacy and asked them if they have Rx that was sent on 6/2, they report they do have rx however insurance will not cover since pt picked up a 30 day supply on 5/25 so it is not time for a refill yet. Unable to notify Nure as there was no contact info left

## 2023-11-11 ENCOUNTER — Other Ambulatory Visit: Payer: Self-pay | Admitting: Family Medicine

## 2023-11-11 DIAGNOSIS — G4733 Obstructive sleep apnea (adult) (pediatric): Secondary | ICD-10-CM

## 2023-11-11 DIAGNOSIS — M8589 Other specified disorders of bone density and structure, multiple sites: Secondary | ICD-10-CM

## 2023-11-11 DIAGNOSIS — E1122 Type 2 diabetes mellitus with diabetic chronic kidney disease: Secondary | ICD-10-CM

## 2023-11-11 DIAGNOSIS — E559 Vitamin D deficiency, unspecified: Secondary | ICD-10-CM

## 2023-11-11 DIAGNOSIS — Z79899 Other long term (current) drug therapy: Secondary | ICD-10-CM

## 2023-11-11 DIAGNOSIS — J40 Bronchitis, not specified as acute or chronic: Secondary | ICD-10-CM

## 2023-11-11 DIAGNOSIS — K219 Gastro-esophageal reflux disease without esophagitis: Secondary | ICD-10-CM

## 2023-11-11 DIAGNOSIS — F3341 Major depressive disorder, recurrent, in partial remission: Secondary | ICD-10-CM

## 2023-11-11 DIAGNOSIS — Z1211 Encounter for screening for malignant neoplasm of colon: Secondary | ICD-10-CM

## 2023-11-11 DIAGNOSIS — I10 Essential (primary) hypertension: Secondary | ICD-10-CM

## 2023-11-11 DIAGNOSIS — E1169 Type 2 diabetes mellitus with other specified complication: Secondary | ICD-10-CM

## 2023-11-11 DIAGNOSIS — Z136 Encounter for screening for cardiovascular disorders: Secondary | ICD-10-CM

## 2023-11-11 DIAGNOSIS — Z0001 Encounter for general adult medical examination with abnormal findings: Secondary | ICD-10-CM

## 2023-11-21 ENCOUNTER — Ambulatory Visit: Payer: PPO | Admitting: Internal Medicine

## 2023-12-02 ENCOUNTER — Telehealth: Payer: Self-pay | Admitting: Physician Assistant

## 2023-12-02 ENCOUNTER — Encounter: Payer: Self-pay | Admitting: Internal Medicine

## 2023-12-02 NOTE — Telephone Encounter (Signed)
 Patient called and stated that she is having an EGD on the July the 3 rd and was wondering if her insurance will pay for her EGD and how much she has to pay out of pocket. Patient is requesting a call back at her home number [phone]. Please advise.

## 2023-12-04 ENCOUNTER — Ambulatory Visit: Admitting: Internal Medicine

## 2023-12-04 ENCOUNTER — Other Ambulatory Visit: Payer: Self-pay

## 2023-12-04 ENCOUNTER — Other Ambulatory Visit: Payer: Self-pay | Admitting: Internal Medicine

## 2023-12-04 ENCOUNTER — Ambulatory Visit: Payer: Self-pay | Admitting: Internal Medicine

## 2023-12-04 ENCOUNTER — Encounter: Payer: Self-pay | Admitting: Internal Medicine

## 2023-12-04 ENCOUNTER — Other Ambulatory Visit (INDEPENDENT_AMBULATORY_CARE_PROVIDER_SITE_OTHER)

## 2023-12-04 VITALS — BP 104/41 | HR 69 | Temp 98.1°F | Resp 28 | Ht 63.0 in | Wt 144.8 lb

## 2023-12-04 DIAGNOSIS — K449 Diaphragmatic hernia without obstruction or gangrene: Secondary | ICD-10-CM | POA: Diagnosis not present

## 2023-12-04 DIAGNOSIS — R195 Other fecal abnormalities: Secondary | ICD-10-CM

## 2023-12-04 DIAGNOSIS — D509 Iron deficiency anemia, unspecified: Secondary | ICD-10-CM

## 2023-12-04 DIAGNOSIS — K3189 Other diseases of stomach and duodenum: Secondary | ICD-10-CM

## 2023-12-04 DIAGNOSIS — K2940 Chronic atrophic gastritis without bleeding: Secondary | ICD-10-CM | POA: Diagnosis not present

## 2023-12-04 LAB — CBC WITH DIFFERENTIAL/PLATELET
Basophils Absolute: 0 10*3/uL (ref 0.0–0.1)
Basophils Relative: 0.7 % (ref 0.0–3.0)
Eosinophils Absolute: 0.2 10*3/uL (ref 0.0–0.7)
Eosinophils Relative: 2.7 % (ref 0.0–5.0)
HCT: 34.7 % — ABNORMAL LOW (ref 36.0–46.0)
Hemoglobin: 11.5 g/dL — ABNORMAL LOW (ref 12.0–15.0)
Lymphocytes Relative: 21.1 % (ref 12.0–46.0)
Lymphs Abs: 1.2 10*3/uL (ref 0.7–4.0)
MCHC: 33.2 g/dL (ref 30.0–36.0)
MCV: 77.7 fl — ABNORMAL LOW (ref 78.0–100.0)
Monocytes Absolute: 0.5 10*3/uL (ref 0.1–1.0)
Monocytes Relative: 9.5 % (ref 3.0–12.0)
Neutro Abs: 3.7 10*3/uL (ref 1.4–7.7)
Neutrophils Relative %: 66 % (ref 43.0–77.0)
Platelets: 149 10*3/uL — ABNORMAL LOW (ref 150.0–400.0)
RBC: 4.47 Mil/uL (ref 3.87–5.11)
RDW: 18.6 % — ABNORMAL HIGH (ref 11.5–15.5)
WBC: 5.6 10*3/uL (ref 4.0–10.5)

## 2023-12-04 LAB — FERRITIN: Ferritin: 6.8 ng/mL — ABNORMAL LOW (ref 10.0–291.0)

## 2023-12-04 MED ORDER — SODIUM CHLORIDE 0.9 % IV SOLN: 500.0000 mL | Freq: Once | INTRAVENOUS | Status: AC

## 2023-12-04 NOTE — Progress Notes (Signed)
 Office Visit 10/21/2023 [name]    [name], PA-C Physician Assistant Iron  deficiency anemia, unspecified iron  deficiency anemia type +1 more Dx Blood In Stools ; Referred by Lendia Boby CROME, NP-C Reason for Visit   Additional Documentation  Vitals: BP 130/64   Pulse 67   Ht 5' 3 (1.6 m)   Wt 145 lb 12.8 oz (66.1 kg)   BMI 25.83 kg/m   BSA 1.71 m      More Vitals  Flowsheets: Anthropometrics,   NEWS,   MEWS Score,   Vital Signs,   Method of Visit  Encounter Info: Billing Info,   History,   Allergies,   Detailed Report   All Notes   Progress Notes by Abran Norleen SAILOR, MD at 10/21/2023 1:30 PM  Author: Abran Norleen SAILOR, MD Author Type: Physician Filed: 10/21/2023  5:02 PM  Note Status: Signed Cosign: Cosign Not Required Encounter Date: 10/21/2023  Editor: Abran Norleen SAILOR, MD (Physician)             Noted        Progress Notes by [name], PA-C at 10/21/2023 1:30 PM  Author: [name], PA-C Author Type: Physician Assistant Filed: 10/21/2023  2:07 PM  Note Status: Signed Cosign: Cosign Not Required Encounter Date: 10/21/2023  Editor: [name], PA-C (Physician Assistant)             Expand All Collapse All       [place], PA-C [address] [place], [place]  [address] Phone: [phone]     Primary Care Physician: Lendia Boby CROME, NP-C   Primary Gastroenterologist:  [place], PA-C / Norleen Abran, MD    Chief Complaint: Iron  deficiency anemia, Heme Positive stool       HPI:   Michele [name] is a 78 y.o. female is here to evaluate mild iron  deficiency anemia and Hemoccult positive stool.  Established patient Dr. Abran.   09/26/2023: Hemoglobin 11.9, MCV 75, low ferritin 6.0 08/29/2023: Hemoglobin 11.4, MCV 73.6, low ferritin 5.3, normal B12 of 693. 04/24/2023: Hemoglobin 12.1, MCV 79, normal ferritin 79   She is currently on iron  tablet once daily.   Current symptoms: She has mild  constipation since starting iron .  Takes Senokot OTC which helps.  Stools have been mildly dark since starting iron .  She denies hematochezia or melena.  Denies abdominal pain or dysphagia.  Rarely has episode of heartburn.  She has had some mild weight loss.  She has had several teeth removed with new partials made.  It it has been hard for her to eat or chew well.  She has been on 325 mg aspirin  for many years.  Aspirin  will was recently decreased to 81 mg daily.  She denies any NSAID use.  Occasionally takes Tylenol  as needed.  She does not donate blood.   03/2023 colonoscopy by Dr. Abran: Good prep.  Normal colonoscopy.  No polyps.   02/2018 colonoscopy: Excellent prep.  2 small polyps removed.   04/2008 EGD: Normal.         Current Outpatient Medications  Medication Sig Dispense Refill   acetaminophen  (TYLENOL ) 500 MG tablet Take 500 mg by mouth 3 (three) times daily as needed for moderate pain.       aspirin  EC 81 MG tablet Take 81 mg by mouth daily. Swallow whole.       atenolol  (TENORMIN ) 100 MG tablet Take 1 tablet every morning for BP 90 tablet 0   CALCIUM   MAGNESIUM  ZINC PO Take by mouth.       cloNIDine  (CATAPRES ) 0.2 MG tablet TAKE 1 TABLET BY MOUTH THREE TIMES DAILY AS NEEDED IF  BLOOD  PRESSURE  IS  OVER  160 270 tablet 0   Cyanocobalamin  (VITAMIN B-12 PO) Take 1 tablet by mouth daily. 5000 mc one time in the AM PRN       ferrous sulfate 325 (65 FE) MG tablet Take 325 mg by mouth daily with breakfast.       metFORMIN  (GLUCOPHAGE -XR) 500 MG 24 hr tablet Take  2 tablets   2 x /day   with Meals  for Diabetes 360 tablet 0   minoxidil  (LONITEN ) 10 MG tablet Take 1 tablet daily for BP 90 tablet 3   olmesartan  (BENICAR ) 40 MG tablet TAKE 1 TABLET BY MOUTH IN THE EVENING FOR BLOOD PRESSURE 90 tablet 0   oxybutynin  (DITROPAN -XL) 10 MG 24 hr tablet Take 1 tablet (10 mg total) by mouth daily. 30 tablet 1   rosuvastatin  (CRESTOR ) 20 MG tablet TAKE 1 TABLET BY MOUTH IN THE MORNING 90 tablet 0    sertraline  (ZOLOFT ) 100 MG tablet TAKE 1 & 1/2 (ONE & ONE-HALF) TABLETS BY MOUTH ONCE DAILY 45 tablet 0   verapamil  (CALAN -SR) 240 MG CR tablet TAKE 1 TABLET BY MOUTH IN THE EVENING WITH FOOD 90 tablet 0      No current facility-administered medications for this visit.             Allergies as of 10/21/2023 - Review Complete 10/21/2023  Allergen Reaction Noted   Lipitor [atorvastatin]   06/26/2013          Past Medical History:  Diagnosis Date   Anemia     Anxiety     Arthritis     Carpal tunnel syndrome     Cataract      bilateral,removed   Cerebrovascular disease 01/02/2017   Duodenal diverticulum     Elevated uric acid in blood     Esophageal motility disorder     Fatty liver     GERD (gastroesophageal reflux disease)     Hyperlipidemia     Hypertension     Lymphocytic colitis 09/22/2012   Sleep apnea     Tubulovillous adenoma of colon     Type II or unspecified type diabetes mellitus without mention of complication, not stated as uncontrolled                 Past Surgical History:  Procedure Laterality Date   APPENDECTOMY       BREAST BIOPSY Right     CARPAL TUNNEL RELEASE Right 1992   cataract extration       COLONOSCOPY       CYST REMOVAL HAND       TONSILLECTOMY   1952          Review of Systems:    All systems reviewed and negative except where noted in HPI.      Physical Exam:  BP 130/64   Pulse 67   Ht 5' 3 (1.6 m)   Wt 145 lb 12.8 oz (66.1 kg)   BMI 25.83 kg/m  No LMP recorded. Patient is postmenopausal.   General: Well-nourished, well-developed in no acute distress.  Lungs: Clear to auscultation bilaterally. Non-labored. Heart: Regular rate and rhythm, no murmurs rubs or gallops.  Abdomen: Bowel sounds are normal; Abdomen is Soft; No hepatosplenomegaly, masses or hernias;  No Abdominal Tenderness; No guarding or rebound tenderness.  Neuro: Alert and oriented x 3.  Grossly intact.  Psych: Alert and cooperative, normal mood and  affect.     Imaging Studies: Imaging Results  No results found.     Labs: CBC Labs (Brief)          Component Value Date/Time    WBC 5.1 09/26/2023 1142    RBC 4.83 09/26/2023 1142    HGB 11.9 (L) 09/26/2023 1142    HCT 36.2 09/26/2023 1142    PLT 161.0 09/26/2023 1142    MCV 75.0 (L) 09/26/2023 1142    MCH 25.6 (L) 04/24/2023 1529    MCHC 32.8 09/26/2023 1142    RDW 20.0 (H) 09/26/2023 1142    LYMPHSABS 1.7 09/26/2023 1142    MONOABS 0.7 09/26/2023 1142    EOSABS 0.2 09/26/2023 1142    BASOSABS 0.0 09/26/2023 1142        CMP     Labs (Brief)          Component Value Date/Time    NA 140 08/29/2023 1149    K 4.1 08/29/2023 1149    CL 102 08/29/2023 1149    CO2 28 08/29/2023 1149    GLUCOSE 74 08/29/2023 1149    BUN 12 08/29/2023 1149    CREATININE 0.93 08/29/2023 1149    CREATININE 1.09 (H) 04/24/2023 1529    CALCIUM  9.9 08/29/2023 1149    PROT 7.4 08/29/2023 1149    ALBUMIN 4.6 08/29/2023 1149    AST 27 08/29/2023 1149    ALT 18 08/29/2023 1149    ALKPHOS 76 08/29/2023 1149    BILITOT 0.6 08/29/2023 1149    GFRNONAA >60 06/12/2022 0437    GFRNONAA 68 09/28/2020 1021    GFRAA 79 09/28/2020 1021              Assessment and Plan:    Michele [name] is a 78 y.o. y/o female is referred to evaluate:   1.  Iron  deficiency anemia, mild, improving on oral iron .  Uncertain etiology. 2.  Heme Positive Stool.  No visible bleeding.    Normal colonoscopy 03/2023.  Last EGD was normal in 2009.  She denies NSAID use or any GI symptoms.  She has taken 325mg  aspirin  for years - recently decreased to 81mg .  Scheduling EGD to evaluate for gastritis, AVMs, or other sources of IDA.     Plan: - Scheduling updated EGD I discussed risks of EGD with patient to include risk of bleeding, perforation, and risk of sedation.  Patient expressed understanding and agrees to proceed with EGD.  - If EGD is unrevealing, then schedule capsule endoscopy - Continue to avoid all  NSAIDS. - Continue OTC Senokot to treat mild constipation on iron .   Ellouise Console, PA-C   Follow up 4 weeks after EGD with TG.        Recent H&P as above.  No interval change.  Now for upper endoscopy

## 2023-12-04 NOTE — Progress Notes (Signed)
 Called to room to assist during endoscopic procedure.  Patient ID and intended procedure confirmed with present staff. Received instructions for my participation in the procedure from the performing physician.

## 2023-12-04 NOTE — Progress Notes (Signed)
 Vss nad trans to pacu

## 2023-12-04 NOTE — Progress Notes (Signed)
 Patient fell on Monday. Right lower leg has pain upon movement. Some scratches noted and bruising.

## 2023-12-04 NOTE — Patient Instructions (Signed)
 Get labs today.  Office will call to schedule capsule endoscopy.  YOU HAD AN ENDOSCOPIC PROCEDURE TODAY AT THE [facility]:   Refer to the procedure report that was given to you for any specific questions about what was found during the examination.  If the procedure report does not answer your questions, please call your gastroenterologist to clarify.  If you requested that your care partner not be given the details of your procedure findings, then the procedure report has been included in a sealed envelope for you to review at your convenience later.  YOU SHOULD EXPECT: Some feelings of bloating in the abdomen. Passage of more gas than usual.  Walking can help get rid of the air that was put into your GI tract during the procedure and reduce the bloating. If you had a lower endoscopy (such as a colonoscopy or flexible sigmoidoscopy) you may notice spotting of blood in your stool or on the toilet paper. If you underwent a bowel prep for your procedure, you may not have a normal bowel movement for a few days.  Please Note:  You might notice some irritation and congestion in your nose or some drainage.  This is from the oxygen used during your procedure.  There is no need for concern and it should clear up in a day or so.  SYMPTOMS TO REPORT IMMEDIATELY:  Following upper endoscopy (EGD)  Vomiting of blood or coffee ground material  New chest pain or pain under the shoulder blades  Painful or persistently difficult swallowing  New shortness of breath  Fever of 100F or higher  Black, tarry-looking stools  For urgent or emergent issues, a gastroenterologist can be reached at any hour by calling [phone]. Do not use MyChart messaging for urgent concerns.    DIET:  We do recommend a small meal at first, but then you may proceed to your regular diet.  Drink plenty of fluids but you should avoid alcoholic beverages for 24 hours.  ACTIVITY:  You should plan to take it easy for the  rest of today and you should NOT DRIVE or use heavy machinery until tomorrow (because of the sedation medicines used during the test).    FOLLOW UP: Our staff will call the number listed on your records the next business day following your procedure.  We will call around 7:15- 8:00 am to check on you and address any questions or concerns that you may have regarding the information given to you following your procedure. If we do not reach you, we will leave a message.     If any biopsies were taken you will be contacted by phone or by letter within the next 1-3 weeks.  Please call us  at [phone] if you have not heard about the biopsies in 3 weeks.    SIGNATURES/CONFIDENTIALITY: You and/or your care partner have signed paperwork which will be entered into your electronic medical record.  These signatures attest to the fact that that the information above on your After Visit Summary has been reviewed and is understood.  Full responsibility of the confidentiality of this discharge information lies with you and/or your care-partner.

## 2023-12-04 NOTE — Op Note (Signed)
 [facility] Patient Name: Michele [name] Procedure Date: 12/04/2023 9:01 AM MRN: [id number] Endoscopist: Norleen SAILOR. Abran , MD, [id number] Age: 78 Referring MD:  Date of Birth: 12-25-1945 Gender: Female Account #: [id number] Procedure:                Upper GI endoscopy with biopsies Indications:              Iron  deficiency anemia, Heme positive stool Medicines:                Monitored Anesthesia Care Procedure:                Pre-Anesthesia Assessment:                           - Prior to the procedure, a History and Physical                            was performed, and patient medications and                            allergies were reviewed. The patient's tolerance of                            previous anesthesia was also reviewed. The risks                            and benefits of the procedure and the sedation                            options and risks were discussed with the patient.                            All questions were answered, and informed consent                            was obtained. Prior Anticoagulants: The patient has                            taken no anticoagulant or antiplatelet agents. ASA                            Grade Assessment: II - A patient with mild systemic                            disease. After reviewing the risks and benefits,                            the patient was deemed in satisfactory condition to                            undergo the procedure.                           After obtaining informed consent, the endoscope was  passed under direct vision. Throughout the                            procedure, the patient's blood pressure, pulse, and                            oxygen saturations were monitored continuously. The                            GIF [id number] #[id number] was introduced through the                            mouth, and advanced to the second part of duodenum.                             The upper GI endoscopy was accomplished without                            difficulty. The patient tolerated the procedure                            well. Scope In: Scope Out: Findings:                 The esophagus was normal.                           The stomach revealed diffusely atrophic gastric                            mucosa. No mucosal abnormalities. Small hiatal                            hernia.                           The examined duodenum was normal. There was slight                            narrowing between the bulb and second portion.                            Biopsies of the postbulbar duodenum were taken with                            a cold forceps for histology rule out sprue.                           The cardia and gastric fundus were normal on                            retroflexion. Complications:            No immediate complications. Estimated Blood Loss:     Estimated blood loss: none. Impression:               1. Atrophic  gastritis                           2. Mild narrowing between the duodenal bulb and                            second portion                           3. Postbulbar duodenal biopsies to rule out sprue                           4. Iron  deficiency anemia and Hemoccult positive                            stool. Recommendation:           - Patient has a contact number available for                            emergencies. The signs and symptoms of potential                            delayed complications were discussed with the                            patient. Return to normal activities tomorrow.                            Written discharge instructions were provided to the                            patient.                           - Resume previous diet.                           - Continue present medications. Continue iron .                           - PLEASE obtain CBC and ferritin level today                           -  PLEASE SCHEDULE CAPSULE ENDOSCOPY iron                             deficiency anemia and Hemoccult positive stool,                            rule out small bowel lesion.                           - Await pathology results. Norleen SAILOR. Abran, MD 12/04/2023 9:36:54 AM This report has been signed electronically.

## 2023-12-08 ENCOUNTER — Telehealth: Payer: Self-pay | Admitting: *Deleted

## 2023-12-08 ENCOUNTER — Other Ambulatory Visit: Payer: Self-pay | Admitting: Family Medicine

## 2023-12-08 DIAGNOSIS — I10 Essential (primary) hypertension: Secondary | ICD-10-CM

## 2023-12-08 NOTE — Telephone Encounter (Signed)
 No answer on  follow up call. Left message.

## 2023-12-10 ENCOUNTER — Telehealth: Payer: Self-pay

## 2023-12-10 DIAGNOSIS — D509 Iron deficiency anemia, unspecified: Secondary | ICD-10-CM

## 2023-12-10 LAB — SURGICAL PATHOLOGY

## 2023-12-10 NOTE — Telephone Encounter (Signed)
 Called home number with no answer and no voicemail to leave a message. Called mobile number and left message for patient to return my call.

## 2023-12-11 ENCOUNTER — Ambulatory Visit: Payer: Self-pay | Admitting: Internal Medicine

## 2023-12-11 ENCOUNTER — Other Ambulatory Visit: Payer: Self-pay

## 2023-12-11 DIAGNOSIS — D509 Iron deficiency anemia, unspecified: Secondary | ICD-10-CM

## 2023-12-11 NOTE — Telephone Encounter (Signed)
Called patient again and line is busy.

## 2023-12-12 ENCOUNTER — Other Ambulatory Visit: Payer: Self-pay | Admitting: Family Medicine

## 2023-12-12 DIAGNOSIS — I10 Essential (primary) hypertension: Secondary | ICD-10-CM

## 2023-12-12 NOTE — Telephone Encounter (Signed)
 Spoke with patient and explained Dr. Abran wants her to have a capsule endoscopy scheduled for her iron  def anemia. Patient verbalized understanding. Scheduled patient for capsule endo on 01/01/24 at 8:30 am. Explained to patient that I will send her instructions for the prep on MyChart. Confirmed patient does not have pacemaker or loop recorder. Patient states if she cannot view her instructions, she will contact our office.

## 2023-12-15 ENCOUNTER — Other Ambulatory Visit: Payer: Self-pay | Admitting: Family Medicine

## 2023-12-15 DIAGNOSIS — I10 Essential (primary) hypertension: Secondary | ICD-10-CM

## 2023-12-19 ENCOUNTER — Other Ambulatory Visit: Payer: Self-pay | Admitting: Family

## 2023-12-19 DIAGNOSIS — E782 Mixed hyperlipidemia: Secondary | ICD-10-CM

## 2023-12-22 ENCOUNTER — Encounter: Payer: Self-pay | Admitting: Nurse Practitioner

## 2023-12-22 ENCOUNTER — Inpatient Hospital Stay

## 2023-12-22 ENCOUNTER — Inpatient Hospital Stay: Attending: Nurse Practitioner | Admitting: Nurse Practitioner

## 2023-12-22 VITALS — BP 138/70 | HR 76 | Temp 97.6°F | Resp 17 | Wt 144.4 lb

## 2023-12-22 DIAGNOSIS — D509 Iron deficiency anemia, unspecified: Secondary | ICD-10-CM | POA: Insufficient documentation

## 2023-12-22 DIAGNOSIS — D508 Other iron deficiency anemias: Secondary | ICD-10-CM

## 2023-12-22 NOTE — Progress Notes (Cosign Needed)
 [facility]   [phone] [phone]   Clinic New consult Note   Patient Care Team: Lendia Boby CROME, NP-C as PCP - General (Family Medicine) Nathanael Davies, [facility] (Inactive) as Pharmacist (Pharmacist) 12/22/2023  CHIEF COMPLAINTS/PURPOSE OF CONSULTATION:  Iron  deficiency anemia, referred by GI Dr. Norleen Kiang PCP Boby Lendia NP  HISTORY OF PRESENTING ILLNESS:  Michele [name] 78 y.o. female with PMH including HTN, HL, DM, CKD, hypothyroidism, vitiligo is here because of iron  deficiency anemia.  She had a normal CBC 06/01/2012.  Colonoscopy by Dr. Obie 09/22/2012 showed normal colon.  Family members have celiac disease, she was tested 09/22/2012 which was negative/normal.  On 06/28/2013 iron  studies showed normal serum iron  87, elevated TIBC 476, low saturation 18%, and a ferritin of 97; B12 level normal at that time 684.  Hemoglobin remained normal through 08/19/2014 up to 14.2 and was even elevated on 11/17/2014 up to 15.3, and normalized during 2017-2019.  Sed rate and ANA were normal on 08/07/2017.  Screening colonoscopy 02/10/2018 by Dr. Kiang removed 2 tubular adenomas.  By 01/2019 she had mild microcytosis MCV 79.2 without anemia.  FOBT 03/10/2019 and 03/22/2020 were negative.  TSH has been normal.  She had persistent mild microcytosis through 02/28/2020 with Hgb normal in the 12 range.  She developed mild anemia Hgb 11.7 on 06/08/2020, SCR 1.32, serum iron  64, TIBC 418, 15% saturation, ferritin 48, A1c 6.3.  Hgb remained in the 11-12 range in 2024.  Colonoscopy 03/23/2023 was entirely normal, no specimens were collected and repeat surveillance colonoscopy was not recommended. By 08/29/23 with hgb 11.4, Ferritin decreased to 5.3, B12 and folate normal; FOBT was positive.  She began oral iron  with ferrous sulfate p.o. once daily with morning meds.  She has been on daily aspirin  325 mg for many years and switch to 81 mg daily.  By 10/27/23 Hgb decreased further to 10.5.  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  Path showed mildly increased intraepithelial lymphocytes, findings which were nonspecific.  Ferritin was persistently low at 6 in July.  Dr. Kiang plans to rule out celiac and obtain a capsule endoscopy.  She donated blood years ago in the past but none recently, denies other NSAIDs or anticoagulation.  She is not a vegetarian, no history of bariatric or intestinal surgeries.  Socially, she is married with 1 child, retired from [employer] but still takes care of yards/acreage. She is very active. Up to date on cancer screenings.  Denies alcohol, tobacco, or drug use.  Mother had iron  deficiency anemia.  Today she presents by herself, feeling well in generally good health except tired but still highly functional. Has ice pica. Tolerating oral iron  with moderate constipation. She is completing antifungals for toenail.  Denies other systemic infections, fever, night sweats, or significant weight loss. Taste for certain foods has changed. She has chronic back pain from growing up on a farm.     MEDICAL HISTORY:  Past Medical History:  Diagnosis Date   Anemia    Anxiety    Arthritis    Carpal tunnel syndrome    Cataract    bilateral,removed   Cerebrovascular disease 01/02/2017   Duodenal diverticulum    Elevated uric acid in blood    Esophageal motility disorder    Fatty liver    GERD (gastroesophageal reflux disease)    Hyperlipidemia    Hypertension    Lymphocytic colitis 09/22/2012   Sleep apnea    Tubulovillous adenoma of colon  Type II or unspecified type diabetes mellitus without mention of complication, not stated as uncontrolled    Yeast detected    Rt big toe    SURGICAL HISTORY: Past Surgical History:  Procedure Laterality Date   APPENDECTOMY     BREAST BIOPSY Right    CARPAL TUNNEL RELEASE Right 1992   cataract extration     COLONOSCOPY     CYST REMOVAL HAND     TONSILLECTOMY  1952    SOCIAL HISTORY: Social History    Socioeconomic History   Marital status: Married    Spouse name: Seena   Number of children: 1   Years of education: 12   Highest education level: Not on file  Occupational History   Occupation: Retired    Associate Professor: [employer]  Tobacco Use   Smoking status: Never   Smokeless tobacco: Never  Vaping Use   Vaping status: Never Used  Substance and Sexual Activity   Alcohol use: No   Drug use: No   Sexual activity: Not Currently    Partners: Male    Comment: Married  Other Topics Concern   Not on file  Social History Narrative   Lives with husband.  Independent of ADLs. 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   Right-handed   Caffeine: about 1 cup of coffee per day, tea and Cokes a few times per week   Social Drivers of Health   Financial Resource Strain: Low Risk  (10/24/2023)   Overall Financial Resource Strain (CARDIA)    Difficulty of Paying Living Expenses: Not hard at all  Food Insecurity: No Food Insecurity (12/22/2023)   Hunger Vital Sign    Worried About Running Out of Food in the Last Year: Never true    Ran Out of Food in the Last Year: Never true  Transportation Needs: No Transportation Needs (12/22/2023)   PRAPARE - Administrator, Civil Service (Medical): No    Lack of Transportation (Non-Medical): No  Physical Activity: Inactive (10/24/2023)   Exercise Vital Sign    Days of Exercise per Week: 0 days    Minutes of Exercise per Session: 0 min  Stress: Stress Concern Present (10/24/2023)   [employer] of Occupational Health - Occupational Stress Questionnaire    Feeling of Stress : Very much  Social Connections: Socially Integrated (10/24/2023)   Social Connection and Isolation Panel    Frequency of Communication with Friends and Family: More than three times a week    Frequency of Social Gatherings with Friends and Family: More than three times a week    Attends Religious Services: More than 4 times per year    Active  Member of [employer] or Organizations: Yes    Attends Banker Meetings: 1 to 4 times per year    Marital Status: Married  Catering manager Violence: Not At Risk (12/22/2023)   Humiliation, Afraid, Rape, and Kick questionnaire    Fear of Current or Ex-Partner: No    Emotionally Abused: No    Physically Abused: No    Sexually Abused: No    FAMILY HISTORY: Family History  Problem Relation Age of Onset   Heart disease Mother    Diabetes Mother    Heart disease Father    Diabetes Father    Heart attack Father    Cerebral aneurysm Sister    Heart disease Brother    Heart attack Brother    Heart disease Brother  Stent   Colon cancer Neg Hx    Esophageal cancer Neg Hx    Stomach cancer Neg Hx    Rectal cancer Neg Hx    Breast cancer Neg Hx    Sleep apnea Neg Hx    Colon polyps Neg Hx    Crohn's disease Neg Hx    Ulcerative colitis Neg Hx     ALLERGIES:  is allergic to lipitor [atorvastatin].  MEDICATIONS:  Current Outpatient Medications  Medication Sig Dispense Refill   acetaminophen  (TYLENOL ) 500 MG tablet Take 500 mg by mouth 3 (three) times daily as needed for moderate pain.     aspirin  EC 81 MG tablet Take 81 mg by mouth daily. Swallow whole.     atenolol  (TENORMIN ) 100 MG tablet TAKE 1 TABLET BY MOUTH IN THE MORNING FOR BLOOD PRESSURE 90 tablet 0   CALCIUM  MAGNESIUM  ZINC PO Take by mouth.     clobetasol cream (TEMOVATE) 0.05 % SMARTSIG:sparingly Topical Twice Daily     cloNIDine  (CATAPRES ) 0.2 MG tablet TAKE 1 TABLET BY MOUTH THREE TIMES DAILY AS NEEDED IF  BLOOD  PRESSURE  IS  OVER  160 270 tablet 0   Cyanocobalamin  (VITAMIN B-12 PO) Take 1 tablet by mouth daily. 5000 mc one time in the AM PRN     ferrous sulfate 325 (65 FE) MG tablet Take 325 mg by mouth daily with breakfast.     itraconazole  (SPORANOX ) 100 MG capsule Take 2 capsules (200 mg total) by mouth 2 (two) times daily. Take 2 capsules by mouth twice daily for 7 days, then stop for three weeks.    Repeat this for two additional months. 84 capsule 0   metFORMIN  (GLUCOPHAGE -XR) 500 MG 24 hr tablet Take  2 tablets   2 x /day   with Meals  for Diabetes 360 tablet 0   minoxidil  (LONITEN ) 10 MG tablet Take 1 tablet daily for BP 90 tablet 3   olmesartan  (BENICAR ) 40 MG tablet TAKE 1 TABLET BY MOUTH IN THE EVENING FOR BLOOD PRESSURE 90 tablet 0   oxybutynin  (DITROPAN -XL) 10 MG 24 hr tablet Take 1 tablet by mouth once daily 30 tablet 1   rosuvastatin  (CRESTOR ) 20 MG tablet TAKE 1 TABLET BY MOUTH IN THE MORNING 90 tablet 0   sertraline  (ZOLOFT ) 100 MG tablet TAKE 1 & 1/2 (ONE & ONE-HALF) TABLETS BY MOUTH ONCE DAILY 45 tablet 0   verapamil  (CALAN -SR) 240 MG CR tablet TAKE 1 TABLET BY MOUTH IN THE EVENING WITH FOOD 90 tablet 0   Current Facility-Administered Medications  Medication Dose Route Frequency Provider Last Rate Last Admin   0.9 %  sodium chloride  infusion  500 mL Intravenous Once Abran Norleen SAILOR, MD        REVIEW OF SYSTEMS:   Constitutional: Denies fevers, chills or abnormal night sweats (+) fatigue Eyes: Denies blurriness of vision, double vision or watery eyes Ears, nose, mouth, throat, and face: Denies mucositis or sore throat Respiratory: Denies cough, dyspnea or wheezes Cardiovascular: Denies palpitation, chest discomfort or lower extremity swelling Gastrointestinal:  Denies nausea, heartburn or change in bowel habits Skin: Denies abnormal skin rashes (+) vitiligo Lymphatics: Denies new lymphadenopathy or easy bruising Neurological:Denies numbness, tingling or new weaknesses Behavioral/Psych: Mood is stable, no new changes  MSK: (+) Chronic back/hip pain All other systems were reviewed with the patient and are negative.  PHYSICAL EXAMINATION: ECOG PERFORMANCE STATUS: 0 - Asymptomatic  Vitals:   12/22/23 1204  BP: 138/70  Pulse: 76  Resp:  17  Temp: 97.6 F (36.4 C)  SpO2: 96%   Filed Weights   12/22/23 1204  Weight: 144 lb 6.4 oz (65.5 kg)    GENERAL:alert, no  distress and comfortable SKIN:  no rashes or significant lesions EYES:  sclera clear NECK:  without nodularity LYMPH:  no palpable cervical or supraclavicular lymphadenopathy LUNGS: clear with normal breathing effort HEART: regular rate & rhythm, no lower extremity edema ABDOMEN:abdomen soft, non-tender and normal bowel sounds Musculoskeletal:no cyanosis of digits and no clubbing  PSYCH: alert & oriented x 3 with fluent speech NEURO: no focal motor/sensory deficits  LABORATORY DATA:  I have reviewed the data as listed    Latest Ref Rng & Units 12/04/2023   10:17 AM 10/27/2023   11:55 PM 09/26/2023   11:42 AM  CBC  WBC 4.0 - 10.5 K/uL 5.6  5.0  5.1   Hemoglobin 12.0 - 15.0 g/dL 88.4  89.4  88.0   Hematocrit 36.0 - 46.0 % 34.7  32.3  36.2   Platelets 150.0 - 400.0 K/uL 149.0  125  161.0        Latest Ref Rng & Units 10/27/2023   11:55 PM 08/29/2023   11:49 AM 04/24/2023    3:29 PM  CMP  Glucose 70 - 99 mg/dL 884  74  90   BUN 8 - 23 mg/dL 18  12  19    Creatinine 0.44 - 1.00 mg/dL 8.64  9.06  8.90   Sodium 135 - 145 mmol/L 138  140  140   Potassium 3.5 - 5.1 mmol/L 4.1  4.1  4.4   Chloride 98 - 111 mmol/L 103  102  103   CO2 22 - 32 mmol/L 25  28  26    Calcium  8.9 - 10.3 mg/dL 9.6  9.9  89.7   Total Protein 6.5 - 8.1 g/dL 5.8  7.4  7.2   Total Bilirubin 0.0 - 1.2 mg/dL 0.9  0.6  0.7   Alkaline Phos 38 - 126 U/L 51  76    AST 15 - 41 U/L 28  27  39   ALT 0 - 44 U/L 21  18  30       RADIOGRAPHIC STUDIES: I have personally reviewed the radiological images as listed and agreed with the findings in the report. No results found.  ASSESSMENT & PLAN: 78 year old female    Anemia, secondary to iron  deficiency -We reviewed her medical record in detail with the patient -She initially developed microcytosis without anemia and August 2020, then developed a mild intermittent anemia without iron  deficiency Hgb 11-12 range and early 2022.  Colonoscopy 03/2023 was normal. -She then had  an mildly progressive anemia in 08/2023 Hgb 10-11 range, with ferritin of 5, and positive FOBT -B12, folate, TSH normal. MCV has been normal, this is not thalassemia -GI workup by Dr. Abran has not identified a bleeding source, she is pending capsule endoscopy at the end of this month -Has been on oral iron  for a couple months p.o. once daily but iron  studies have not improved much. -Given that she has mild iron  deficiency anemia and is not very symptomatic it is reasonable to continue oral iron  which is her preference, she will increase ferrous sulfate to BID, take on an empty stomach, and add vitamin C -Briefly discussed IV iron  logistis/risk/benefits etc.  -We discussed the possibility of microscopic bleeding given that she is on daily aspirin , although she has switched to a lower dose.  -We discussed she  may have a component of anemia of chronic disease given her other co-morbidities.  May recommend further workup if she has persistent anemia despite adequate iron  replacement -We discussed the chance of a bone marrow condition such as MDS, which is less likely given the mild and intermittent nature of her anemia and only mildly low platelet x 1.  We are not recommending a bone marrow biopsy at this time. -Repeat labs in 2-3 months to see how she is responding to oral iron  and monitor her counts.  Will see her back with lab and office visit in 6 months, or sooner if she becomes more symptomatic or develops other cytopenias -Patient seen with Dr. Lanny    PLAN: -Medical record, endoscopies, labs, path reviewed -Iron  rich diet -Increase ferrous sulfate to 1 tab PO BID, take on empty stomach, take with vitamin C (continue symptom management for constipation) -Proceed with capsule endoscopy and celiac testing per GI -Lab in 2-3 months -Lab and office visit with hematology in 6 months -Will see her sooner to discuss IV iron  if she becomes more symptomatic or anemia worsens; and will discuss further  work up if she develops other cytopenias  -Pt seen with Dr. Lanny    Orders Placed This Encounter  Procedures   CBC with Differential (Cancer Center Only)    Standing Status:   Standing    Number of Occurrences:   6    Expiration Date:   12/21/2024   Ferritin    Standing Status:   Standing    Number of Occurrences:   6    Expiration Date:   12/21/2024   Iron  and Iron  Binding Capacity ([facility] only)    Standing Status:   Standing    Number of Occurrences:   6    Expiration Date:   12/21/2024   Retic Panel    Standing Status:   Standing    Number of Occurrences:   6    Expiration Date:   12/21/2024     All questions were answered. The patient knows to call the clinic with any problems, questions or concerns.      Sierria Bruney K Assunta Pupo, NP 12/22/23  Addendum I have seen the patient, examined her. I agree with the assessment and and plan and have edited the notes.   78 year old female with past medical history of hypertension, diabetes, CKD, hypothyroidism, was referred by her GI for iron  deficient anemia.  She has mild anemia with hemoglobin 11-11.9, study was consistent with iron  deficiency.  She has done EGD and colonoscopy, no significant source of bleeding was found.  She is not symptomatic.  Will increase her oral iron  to twice daily, and a repeat CBC and iron  study in 3 months.  Will consider IV iron  if she does not respond adequately to oral iron .  She is also scheduled for endoscopy capsule.  Will see her back in 6 months.  All questions were answered.  I spent a total of 30 minutes for her visit today.  Onita Lanny MD 12/22/2023

## 2023-12-23 ENCOUNTER — Other Ambulatory Visit: Payer: PPO

## 2023-12-31 ENCOUNTER — Ambulatory Visit: Payer: Self-pay | Admitting: Family Medicine

## 2023-12-31 ENCOUNTER — Encounter: Payer: Self-pay | Admitting: Family Medicine

## 2023-12-31 ENCOUNTER — Other Ambulatory Visit: Payer: Self-pay | Admitting: Family Medicine

## 2023-12-31 ENCOUNTER — Ambulatory Visit (INDEPENDENT_AMBULATORY_CARE_PROVIDER_SITE_OTHER): Admitting: Family Medicine

## 2023-12-31 VITALS — BP 110/64 | HR 63 | Temp 97.6°F | Ht 63.0 in | Wt 141.0 lb

## 2023-12-31 DIAGNOSIS — R32 Unspecified urinary incontinence: Secondary | ICD-10-CM

## 2023-12-31 DIAGNOSIS — R7989 Other specified abnormal findings of blood chemistry: Secondary | ICD-10-CM | POA: Diagnosis not present

## 2023-12-31 DIAGNOSIS — E1122 Type 2 diabetes mellitus with diabetic chronic kidney disease: Secondary | ICD-10-CM | POA: Diagnosis not present

## 2023-12-31 DIAGNOSIS — F3341 Major depressive disorder, recurrent, in partial remission: Secondary | ICD-10-CM | POA: Diagnosis not present

## 2023-12-31 DIAGNOSIS — N182 Chronic kidney disease, stage 2 (mild): Secondary | ICD-10-CM | POA: Diagnosis not present

## 2023-12-31 DIAGNOSIS — I10 Essential (primary) hypertension: Secondary | ICD-10-CM | POA: Diagnosis not present

## 2023-12-31 DIAGNOSIS — R42 Dizziness and giddiness: Secondary | ICD-10-CM | POA: Diagnosis not present

## 2023-12-31 DIAGNOSIS — Z7984 Long term (current) use of oral hypoglycemic drugs: Secondary | ICD-10-CM

## 2023-12-31 DIAGNOSIS — N3281 Overactive bladder: Secondary | ICD-10-CM

## 2023-12-31 DIAGNOSIS — W19XXXA Unspecified fall, initial encounter: Secondary | ICD-10-CM

## 2023-12-31 DIAGNOSIS — G4733 Obstructive sleep apnea (adult) (pediatric): Secondary | ICD-10-CM

## 2023-12-31 DIAGNOSIS — E782 Mixed hyperlipidemia: Secondary | ICD-10-CM

## 2023-12-31 LAB — COMPREHENSIVE METABOLIC PANEL WITH GFR
ALT: 44 U/L — ABNORMAL HIGH (ref 0–35)
AST: 66 U/L — ABNORMAL HIGH (ref 0–37)
Albumin: 4.2 g/dL (ref 3.5–5.2)
Alkaline Phosphatase: 56 U/L (ref 39–117)
BUN: 10 mg/dL (ref 6–23)
CO2: 29 meq/L (ref 19–32)
Calcium: 9.4 mg/dL (ref 8.4–10.5)
Chloride: 103 meq/L (ref 96–112)
Creatinine, Ser: 0.75 mg/dL (ref 0.40–1.20)
GFR: 76.48 mL/min (ref >60.00)
Glucose, Bld: 104 mg/dL — ABNORMAL HIGH (ref 70–99)
Potassium: 4.2 meq/L (ref 3.5–5.1)
Sodium: 140 meq/L (ref 135–145)
Total Bilirubin: 0.9 mg/dL (ref 0.2–1.2)
Total Protein: 6.5 g/dL (ref 6.0–8.3)

## 2023-12-31 LAB — CBC
HCT: 37 % (ref 36.0–46.0)
Hemoglobin: 12.2 g/dL (ref 12.0–15.0)
MCHC: 32.9 g/dL (ref 30.0–36.0)
MCV: 79.1 fl (ref 78.0–100.0)
Platelets: 143 K/uL — ABNORMAL LOW (ref 150.0–400.0)
RBC: 4.68 Mil/uL (ref 3.87–5.11)
RDW: 17 % — ABNORMAL HIGH (ref 11.5–15.5)
WBC: 5 K/uL (ref 4.0–10.5)

## 2023-12-31 LAB — HEMOGLOBIN A1C: Hgb A1c MFr Bld: 6.4 % (ref 4.6–6.5)

## 2023-12-31 LAB — VITAMIN D 25 HYDROXY (VIT D DEFICIENCY, FRACTURES): VITD: 76.21 ng/mL (ref 30.00–100.00)

## 2023-12-31 MED ORDER — GEMTESA 75 MG PO TABS: 75.0000 mg | ORAL_TABLET | Freq: Every day | ORAL | 2 refills | Status: DC

## 2023-12-31 NOTE — Progress Notes (Signed)
 Subjective:     Patient ID: Michele [name], female    DOB: 1945/10/31, 78 y.o.   MRN: [id number]  Chief Complaint  Patient presents with   Medical Management of Chronic Issues    3 month f/u    HPI  Discussed the use of AI scribe software for clinical note transcription with the patient, who gave verbal consent to proceed.  History of Present Illness Michele [name] is a 78 year old female with hypertension, hyperlipidemia, diabetes, CKD, hypothyroidism, and vitiligo who presents for a three-month follow-up on chronic health conditions.  Anemia and iron  deficiency - Currently under hematology evaluation for iron  deficiency anemia - On oral iron  supplementation - Hematologist discussed possibility of anemia of chronic disease; myelodysplastic syndrome considered less likely - No bone marrow biopsy recommended - Follow-up with hematology scheduled in six months - Experiences constipation  Hypertension - Takes olmesartan , verapamil , minoxidil , and atenolol  - Uses clonidine  as needed for systolic blood pressure greater than 160 mmHg - Blood pressure fluctuates, with higher readings during the day and lower in the evening  Diabetes mellitus - Last hemoglobin A1c was 6.6% - Takes metformin  1000 mg twice daily - Checks blood glucose every morning  Chronic kidney disease - Chronic kidney disease present; no acute symptoms reported  Hyperlipidemia - Hyperlipidemia present; no acute symptoms reported  Hypothyroidism - Hypothyroidism present; no acute symptoms reported  Overactive bladder and urinary frequency - Takes oxybutynin  for overactive bladder - Urinates frequently, up to eleven times nightly - Improvement in symptoms with oxybutynin ; increased symptoms if a dose is missed - Referred to urogynecology for incontinence but has not yet been contacted  Fatigue and orthostatic symptoms - Persistent fatigue for at least one year - Lightheadedness when bending over and  standing up - Experienced a fall four weeks ago due to lightheadedness - Treated for dehydration in the emergency department and received intravenous fluids - Attempting to increase fluid intake but finds it challenging due to frequent urination  Mood disturbance and psychosocial stressors - Takes sertraline  for mood - Uncertain of sertraline  effectiveness - Feels overwhelmed by numerous medical appointments and personal stressors, including husband's recent eye surgeries and anniversaries of brother's and father's deaths  Sleep apnea and cpap use - Uses CPAP machine nightly - Occasional ear itching associated with CPAP use - No headaches or ear pain  Gastrointestinal health and colon polyps - History of colon polyps - Normal colonoscopy on March 23, 2023  Vitamin d  toxicity - Vitamin D  level significantly elevated at last visit - Stopped vitamin D  supplementation - Not taking multivitamins with vitamin D  - Currently taking magnesium , calcium , and CoQ10  Vitiligo - Vitiligo present; no acute symptoms reported       Health Maintenance Due  Topic Date Due   COVID-19 Vaccine (6 - Pfizer risk 2024-25 season) 10/02/2023    Past Medical History:  Diagnosis Date   Anemia    Anxiety    Arthritis    Carpal tunnel syndrome    Cataract    bilateral,removed   Cerebrovascular disease 01/02/2017   Duodenal diverticulum    Elevated uric acid in blood    Esophageal motility disorder    Fatty liver    GERD (gastroesophageal reflux disease)    Hyperlipidemia    Hypertension    Lymphocytic colitis 09/22/2012   Sleep apnea    Tubulovillous adenoma of colon    Type II or unspecified type diabetes mellitus without mention of complication, not stated as  uncontrolled    Yeast detected    Rt big toe    Past Surgical History:  Procedure Laterality Date   APPENDECTOMY     BREAST BIOPSY Right    CARPAL TUNNEL RELEASE Right 1992   cataract extration     COLONOSCOPY     CYST  REMOVAL HAND     TONSILLECTOMY  1952    Family History  Problem Relation Age of Onset   Heart disease Mother    Diabetes Mother    Heart disease Father    Diabetes Father    Heart attack Father    Cerebral aneurysm Sister    Heart disease Brother    Heart attack Brother    Heart disease Brother        Stent   Colon cancer Neg Hx    Esophageal cancer Neg Hx    Stomach cancer Neg Hx    Rectal cancer Neg Hx    Breast cancer Neg Hx    Sleep apnea Neg Hx    Colon polyps Neg Hx    Crohn's disease Neg Hx    Ulcerative colitis Neg Hx     Social History   Socioeconomic History   Marital status: Married    Spouse name: Seena   Number of children: 1   Years of education: 12   Highest education level: Not on file  Occupational History   Occupation: Retired    Associate Professor: [employer]  Tobacco Use   Smoking status: Never   Smokeless tobacco: Never  Vaping Use   Vaping status: Never Used  Substance and Sexual Activity   Alcohol use: No   Drug use: No   Sexual activity: Not Currently    Partners: Male    Comment: Married  Other Topics Concern   Not on file  Social History Narrative   Lives with husband.  Independent of ADLs. 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   Right-handed   Caffeine: about 1 cup of coffee per day, tea and Cokes a few times per week   Social Drivers of Health   Financial Resource Strain: Low Risk  (10/24/2023)   Overall Financial Resource Strain (CARDIA)    Difficulty of Paying Living Expenses: Not hard at all  Food Insecurity: No Food Insecurity (12/22/2023)   Hunger Vital Sign    Worried About Running Out of Food in the Last Year: Never true    Ran Out of Food in the Last Year: Never true  Transportation Needs: No Transportation Needs (12/22/2023)   PRAPARE - Administrator, Civil Service (Medical): No    Lack of Transportation (Non-Medical): No  Physical Activity: Inactive (10/24/2023)   Exercise Vital Sign     Days of Exercise per Week: 0 days    Minutes of Exercise per Session: 0 min  Stress: Stress Concern Present (10/24/2023)   [employer] of Occupational Health - Occupational Stress Questionnaire    Feeling of Stress : Very much  Social Connections: Socially Integrated (10/24/2023)   Social Connection and Isolation Panel    Frequency of Communication with Friends and Family: More than three times a week    Frequency of Social Gatherings with Friends and Family: More than three times a week    Attends Religious Services: More than 4 times per year    Active Member of [employer] or Organizations: Yes    Attends Banker Meetings: 1 to 4 times per year  Marital Status: Married  Catering manager Violence: Not At Risk (12/22/2023)   Humiliation, Afraid, Rape, and Kick questionnaire    Fear of Current or Ex-Partner: No    Emotionally Abused: No    Physically Abused: No    Sexually Abused: No    Outpatient Medications Prior to Visit  Medication Sig Dispense Refill   acetaminophen  (TYLENOL ) 500 MG tablet Take 500 mg by mouth 3 (three) times daily as needed for moderate pain.     aspirin  EC 81 MG tablet Take 81 mg by mouth daily. Swallow whole.     atenolol  (TENORMIN ) 100 MG tablet TAKE 1 TABLET BY MOUTH IN THE MORNING FOR BLOOD PRESSURE 90 tablet 0   CALCIUM  MAGNESIUM  ZINC PO Take by mouth.     cloNIDine  (CATAPRES ) 0.2 MG tablet TAKE 1 TABLET BY MOUTH THREE TIMES DAILY AS NEEDED IF  BLOOD  PRESSURE  IS  OVER  160 270 tablet 0   Cyanocobalamin  (VITAMIN B-12 PO) Take 1 tablet by mouth daily. 5000 mc one time in the AM PRN     ferrous sulfate 325 (65 FE) MG tablet Take 325 mg by mouth daily with breakfast.     itraconazole  (SPORANOX ) 100 MG capsule Take 2 capsules (200 mg total) by mouth 2 (two) times daily. Take 2 capsules by mouth twice daily for 7 days, then stop for three weeks.   Repeat this for two additional months. 84 capsule 0   metFORMIN  (GLUCOPHAGE -XR) 500 MG 24 hr  tablet Take  2 tablets   2 x /day   with Meals  for Diabetes 360 tablet 0   minoxidil  (LONITEN ) 10 MG tablet Take 1 tablet daily for BP 90 tablet 3   olmesartan  (BENICAR ) 40 MG tablet TAKE 1 TABLET BY MOUTH IN THE EVENING FOR BLOOD PRESSURE 90 tablet 0   oxybutynin  (DITROPAN -XL) 10 MG 24 hr tablet Take 1 tablet by mouth once daily 30 tablet 1   rosuvastatin  (CRESTOR ) 20 MG tablet TAKE 1 TABLET BY MOUTH IN THE MORNING 90 tablet 0   sertraline  (ZOLOFT ) 100 MG tablet TAKE 1 & 1/2 (ONE & ONE-HALF) TABLETS BY MOUTH ONCE DAILY 45 tablet 0   verapamil  (CALAN -SR) 240 MG CR tablet TAKE 1 TABLET BY MOUTH IN THE EVENING WITH FOOD 90 tablet 0   clobetasol cream (TEMOVATE) 0.05 % SMARTSIG:sparingly Topical Twice Daily     Facility-Administered Medications Prior to Visit  Medication Dose Route Frequency Provider Last Rate Last Admin   0.9 %  sodium chloride  infusion  500 mL Intravenous Once Abran Norleen SAILOR, MD        Allergies  Allergen Reactions   Lipitor [Atorvastatin] Other (See Comments)    Elevates LFT's    ROS See HPI    Objective:    Physical Exam Constitutional:      General: She is not in acute distress.    Appearance: She is not ill-appearing.  HENT:     Mouth/Throat:     Mouth: Mucous membranes are moist.     Pharynx: Oropharynx is clear.  Eyes:     Extraocular Movements: Extraocular movements intact.     Conjunctiva/sclera: Conjunctivae normal.  Cardiovascular:     Rate and Rhythm: Normal rate and regular rhythm.  Pulmonary:     Effort: Pulmonary effort is normal.     Breath sounds: Normal breath sounds.  Musculoskeletal:     Cervical back: Normal range of motion and neck supple.     Right lower leg: No edema.  Left lower leg: No edema.  Skin:    General: Skin is warm and dry.  Neurological:     General: No focal deficit present.     Mental Status: She is alert and oriented to person, place, and time.     Cranial Nerves: No cranial nerve deficit.     Motor: No  weakness.     Coordination: Coordination normal.     Gait: Gait normal.  Psychiatric:        Mood and Affect: Mood normal.        Behavior: Behavior normal.        Thought Content: Thought content normal.      BP 110/64 (BP Location: Left Arm, Patient Position: Sitting)   Pulse 63   Temp 97.6 F (36.4 C) (Temporal)   Ht 5' 3 (1.6 m)   Wt 141 lb (64 kg)   SpO2 99%   BMI 24.98 kg/m  Wt Readings from Last 3 Encounters:  12/31/23 141 lb (64 kg)  12/22/23 144 lb 6.4 oz (65.5 kg)  12/04/23 144 lb 12.8 oz (65.7 kg)       Assessment & Plan:   Problem List Items Addressed This Visit     Depression, major, recurrent, in partial remission (HCC)   Essential hypertension   Overactive bladder   Type 2 diabetes mellitus (HCC) - Primary   Relevant Orders   CBC   Comprehensive metabolic panel with GFR   Hemoglobin A1c   Urinary incontinence   Other Visit Diagnoses       High serum vitamin D        Relevant Orders   VITAMIN D  25 Hydroxy (Vit-D Deficiency, Fractures)     Obstructive sleep apnea on CPAP         Hyperlipidemia, mixed         Fall, initial encounter         Dizziness       Relevant Orders   CBC   Comprehensive metabolic panel with GFR      Assessment and Plan Assessment & Plan Iron  deficiency anemia (possible anemia of chronic disease) Iron  deficiency anemia with possible anemia of chronic disease. Hematology considered MDS but deemed it less likely due to mild anemia. No bone marrow biopsy recommended. - Continue oral iron  supplementation, double the dose for three months. - Perform capsule endoscopy to rule out gastrointestinal bleeding. - Follow up with hematology in six months.  Hypertension Hypertension with variable control. Systolic pressures reach 160-170 mmHg during the day. - Continue current antihypertensive regimen including clonidine  as needed for systolic BP >160 mmHg. - Monitor blood pressure daily.  Type 2 diabetes mellitus, well  controlled Type 2 diabetes mellitus, well controlled with an A1c of 6.6%. - Continue current diabetes management regimen. - Monitor blood sugar levels daily.  Chronic kidney disease Chronic kidney disease.  Hypothyroidism Hypothyroidism.  Hyperlipidemia Hyperlipidemia.  Overactive bladder with urinary incontinence Overactive bladder with urinary incontinence. Oxybutynin  provides some relief but not complete control. Discussed switching to Gemtesa , pending insurance coverage. - Call urogynecology to schedule an appointment. - Consider switching oxybutynin  to Gemtesa , pending insurance coverage.  Constipation Constipation. - Increase fluid intake to help with constipation.  Onychomycosis Onychomycosis, under treatment with oral antifungals. - Follow up with podiatry for onychomycosis treatment.  Fatigue Fatigue, ongoing for at least one year. Possible contributing factors include anemia and dehydration.  Dehydration, recent episode Recent episode of dehydration requiring emergency department visit for IV fluids. - Increase fluid intake to  prevent dehydration.  Dizziness and orthostatic symptoms Dizziness and orthostatic symptoms, possibly related to anemia and dehydration. - Increase fluid intake. - Be cautious with positional changes to prevent falls.  Depression Depression, currently on sertraline . Mood affected by multiple stressors including recent family losses and caregiving responsibilities. - Continue sertraline  for depression.    I have discontinued Niels B. Star's clobetasol cream. I am also having her maintain her Cyanocobalamin  (VITAMIN B-12 PO), acetaminophen , CALCIUM  MAGNESIUM  ZINC PO, cloNIDine , metFORMIN , minoxidil , rosuvastatin , verapamil , sertraline , aspirin  EC, ferrous sulfate, itraconazole , oxybutynin , olmesartan , and atenolol . We will continue to administer sodium chloride .  No orders of the defined types were placed in this encounter.

## 2023-12-31 NOTE — Progress Notes (Signed)
 Please give patient a call regarding elevated liver enzymes and the new medication I sent to her pharmacy called Gemtesa .  Please see my note to her.

## 2023-12-31 NOTE — Patient Instructions (Signed)
 Please go downstairs for labs before you leave.  Continue your current medications for now.  I will check on Gemtesa  which is a new medication for bladder issues.  We will be in touch about this.  Use caution changing positions especially bending over.  Stay hydrated

## 2024-01-01 ENCOUNTER — Telehealth: Payer: Self-pay

## 2024-01-01 ENCOUNTER — Ambulatory Visit: Admitting: Internal Medicine

## 2024-01-01 DIAGNOSIS — D509 Iron deficiency anemia, unspecified: Secondary | ICD-10-CM

## 2024-01-01 DIAGNOSIS — R195 Other fecal abnormalities: Secondary | ICD-10-CM

## 2024-01-01 NOTE — Patient Instructions (Signed)
You may have clear liquids beginning at 10:30 am after ingesting the capsule.    You can have a light lunch at 12:30 pm; sandwich and half bowl of soup.  Return to the office at 4 pm to return the equipment.   Return to you normal diet at 5 pm.   Call [phone] and ask for Glendora Score, RN if you have any questions.  You should pass the capsule in your stool 8-48 hours after ingestion. If you have not passed the capsule, after 72 hours, please contact the office at [phone].

## 2024-01-01 NOTE — Telephone Encounter (Signed)
 Pt made aware of MD recommendations.  

## 2024-01-01 NOTE — Progress Notes (Unsigned)
 SN: [id number] Exp: 2024-08-13 LOT: [id number]  Patient arrived for VCE. Reported the prep went well. This RN explained capsule dietary restrictions for the next few hours. Pt advised to return at 4 pm to return capsule equipment.  Patient verbalized understanding. Opened capsule, ensured capsule was flashing prior to the patient swallowing the capsule. Patient swallowed capsule without difficulty.  Patient told to call the office with any questions and if capsule has not passed after 72 hours. No further questions by the conclusion of the visit.

## 2024-01-01 NOTE — Telephone Encounter (Signed)
 Mild elevation noted. Keep follow-up with Ellouise as planned

## 2024-01-01 NOTE — Telephone Encounter (Signed)
 Patient arrived for capsule & mentioned that her LFT's were elevated yesterday, and that PCP asked for Dr. Abran to review labs. Will route to MD & made pt aware to also discuss with Ellouise, PA at her scheduled follow up next week.

## 2024-01-04 NOTE — Progress Notes (Unsigned)
 Ellouise Console, PA-C [address] [place], [place]  [address] Phone: [phone]   Primary Care Physician: Lendia Boby CROME, NP-C  Primary Gastroenterologist:  Ellouise Console, PA-C / Norleen Kiang, MD   Chief Complaint: Follow-up iron  deficiency anemia, Heme + stool, Elevated LFTs       HPI:   Michele [name] is a 78 y.o. female returns for follow-up of iron  deficiency anemia and heme positive stool.  12/04/2023 EGD by Dr. Kiang: Atrophic gastritis.  Mild narrowing between duodenal bulb and second portion.  Normal esophagus.   1. Surgical [P], duodenal biopsy :       -  SMALL INTESTINAL MUCOSA WITH MILDLY INCREASED INTRAEPITHELIAL LYMPHOCYTES IN       THE ABSENCE OF VILLOUS BLUNTING, SEE NOTE.       NOTE: THE ABOVE FINDINGS ARE NONSPECIFIC AND CAN BE SEEN IN INFECTIONS (I.E.       VIRAL OR HELICOBACTER PYLORI), MEDICATIONS (SUCH AS NSAIDS) AS WELL AS LATENT       CELIAC DISEASE, AMONGST OTHERS.  CLINICAL CORRELATION RECOMMENDED.   She saw hematologist who did not recommend IV iron .  Her oral iron  was increased to twice daily dose.  She Needs celiac labs.  Of note, patient states her son and granddaughter both have celiac disease.  She completed capsule endoscopy test on 01/01/24.  Results pending.  She never saw the capsule pass, and she is worried she has a retained capsule.  She denies abdominal pain, nausea, vomiting, melena, or hematochezia.  Recently had mild loose stool.  Has been constipated in the past, but not recently.  Her Aspirin  was decreased from 325mg  to 81mg  dose.  She denies NSAID use.  She has never drank alcohol.  Her liver transaminases have been mildly elevated over the past year.  She has been told she had fatty liver in the past.  12/31/2023: Hgb 12.2, HCT 37, MCV 79, Platelets 143.  Elevated AST 66, ALT 44.  Noraml Alk Phos 56, T. Bili 0.9.    12/04/2023 lab: Hgb 11.5, hematocrit 34, MCV 77, platelet 149.  Low ferritin 6.8  09/26/2023: Hemoglobin 11.9, MCV  75, low ferritin 6.0 08/29/2023: Hemoglobin 11.4, MCV 73.6, low ferritin 5.3, normal B12 of 693. 04/24/2023: Hemoglobin 12.1, MCV 79, normal ferritin 79   03/2023 colonoscopy by Dr. Kiang: Good prep.  Normal colonoscopy.  No polyps.   02/2018 colonoscopy: Excellent prep.  2 small polyps removed.   04/2008 EGD: Normal.  Current Outpatient Medications  Medication Sig Dispense Refill   acetaminophen  (TYLENOL ) 500 MG tablet Take 500 mg by mouth 3 (three) times daily as needed for moderate pain.     aspirin  EC 81 MG tablet Take 81 mg by mouth daily. Swallow whole.     atenolol  (TENORMIN ) 100 MG tablet TAKE 1 TABLET BY MOUTH IN THE MORNING FOR BLOOD PRESSURE 90 tablet 0   CALCIUM  MAGNESIUM  ZINC PO Take by mouth.     cloNIDine  (CATAPRES ) 0.2 MG tablet TAKE 1 TABLET BY MOUTH THREE TIMES DAILY AS NEEDED IF  BLOOD  PRESSURE  IS  OVER  160 270 tablet 0   Cyanocobalamin  (VITAMIN B-12 PO) Take 1 tablet by mouth daily. 5000 mc one time in the AM PRN     ferrous sulfate 325 (65 FE) MG tablet Take 325 mg by mouth daily with breakfast.     itraconazole  (SPORANOX ) 100 MG capsule Take 2 capsules (200 mg total) by mouth 2 (two) times daily.  Take 2 capsules by mouth twice daily for 7 days, then stop for three weeks.   Repeat this for two additional months. 84 capsule 0   metFORMIN  (GLUCOPHAGE -XR) 500 MG 24 hr tablet Take  2 tablets   2 x /day   with Meals  for Diabetes 360 tablet 0   minoxidil  (LONITEN ) 10 MG tablet Take 1 tablet daily for BP 90 tablet 3   olmesartan  (BENICAR ) 40 MG tablet TAKE 1 TABLET BY MOUTH IN THE EVENING FOR BLOOD PRESSURE 90 tablet 0   rosuvastatin  (CRESTOR ) 20 MG tablet TAKE 1 TABLET BY MOUTH IN THE MORNING 90 tablet 0   sertraline  (ZOLOFT ) 100 MG tablet TAKE 1 & 1/2 (ONE & ONE-HALF) TABLETS BY MOUTH ONCE DAILY 45 tablet 0   verapamil  (CALAN -SR) 240 MG CR tablet TAKE 1 TABLET BY MOUTH IN THE EVENING WITH FOOD 90 tablet 0   Vibegron  (GEMTESA ) 75 MG TABS Take 1 tablet (75 mg total) by  mouth daily. 30 tablet 2   Current Facility-Administered Medications  Medication Dose Route Frequency Provider Last Rate Last Admin   0.9 %  sodium chloride  infusion  500 mL Intravenous Once Abran Norleen SAILOR, MD        Allergies as of 01/05/2024 - Review Complete 01/05/2024  Allergen Reaction Noted   Lipitor [atorvastatin] Other (See Comments) 06/26/2013    Past Medical History:  Diagnosis Date   Anemia    Anxiety    Arthritis    Carpal tunnel syndrome    Cataract    bilateral,removed   Cerebrovascular disease 01/02/2017   Duodenal diverticulum    Elevated uric acid in blood    Esophageal motility disorder    Fatty liver    GERD (gastroesophageal reflux disease)    Hyperlipidemia    Hypertension    Lymphocytic colitis 09/22/2012   Sleep apnea    Tubulovillous adenoma of colon    Type II or unspecified type diabetes mellitus without mention of complication, not stated as uncontrolled    Yeast detected    Rt big toe    Past Surgical History:  Procedure Laterality Date   APPENDECTOMY     BREAST BIOPSY Right    CARPAL TUNNEL RELEASE Right 1992   cataract extration     COLONOSCOPY     CYST REMOVAL HAND     TONSILLECTOMY  1952    Review of Systems:    All systems reviewed and negative except where noted in HPI.    Physical Exam:  BP (!) 140/76 (BP Location: Left Arm, Patient Position: Sitting)   Pulse 69   Ht 5' 1 (1.549 m) Comment: measured without shoes  Wt 143 lb (64.9 kg)   BMI 27.02 kg/m  No LMP recorded. Patient is postmenopausal.  General: Well-nourished, well-developed in no acute distress.  Neuro: Alert and oriented x 3.  Grossly intact.  Psych: Alert and cooperative, normal mood and affect.   Imaging Studies: No results found.  Labs: CBC    Component Value Date/Time   WBC 5.0 12/31/2023 1128   RBC 4.68 12/31/2023 1128   HGB 12.2 12/31/2023 1128   HCT 37.0 12/31/2023 1128   PLT 143.0 (L) 12/31/2023 1128   MCV 79.1 12/31/2023 1128   MCH  25.4 (L) 10/27/2023 2355   MCHC 32.9 12/31/2023 1128   RDW 17.0 (H) 12/31/2023 1128   LYMPHSABS 1.2 12/04/2023 1017   MONOABS 0.5 12/04/2023 1017   EOSABS 0.2 12/04/2023 1017   BASOSABS 0.0 12/04/2023 1017  CMP     Component Value Date/Time   NA 140 12/31/2023 1128   K 4.2 12/31/2023 1128   CL 103 12/31/2023 1128   CO2 29 12/31/2023 1128   GLUCOSE 104 (H) 12/31/2023 1128   BUN 10 12/31/2023 1128   CREATININE 0.75 12/31/2023 1128   CREATININE 1.09 (H) 04/24/2023 1529   CALCIUM  9.4 12/31/2023 1128   PROT 6.5 12/31/2023 1128   ALBUMIN 4.2 12/31/2023 1128   AST 66 (H) 12/31/2023 1128   ALT 44 (H) 12/31/2023 1128   ALKPHOS 56 12/31/2023 1128   BILITOT 0.9 12/31/2023 1128   GFRNONAA 40 (L) 10/27/2023 2355   GFRNONAA 68 09/28/2020 1021   GFRAA 79 09/28/2020 1021       Assessment and Plan:   Michele [name] is a 78 y.o. y/o female returns for follow-up of:  1.  Iron  deficiency anemia -currently improved. 2.  Heme positive stool  3.  Atrophic gastritis 4.  Elevated liver transaminases 5.  Thrombocytopenia 6.  Hepatic steatosis  03/2023 colonoscopy was normal.  12/04/2023 EGD showed atrophic gastritis and mild abnormal area in the duodenum.  Biopsy of duodenum showed mild increased intraepithelial lymphocytes but no villous blunting.  Nonspecific, however could be seen in NSAID use or latent celiac.  Ordering celiac lab panel.  Of note, her son and granddaughter both have celiac.  Patient denies NSAID use.  Aspirin  was recently decreased from 3 25-81 Mg daily.  Her liver transaminases have been mild chronically elevated.  Patient reports history of fatty liver disease.  She has never drank alcohol.  I am ordering labs and RUQ liver ultrasound for further evaluation.  Plan: - Labs: Celiac lab panel, iron  panel, ANA, AMA, ASMA, ceruloplasmin, viral hepatitis A/B/C, immunoglobulins, alpha-1 antitrypsin  - Schedule RUQ ultrasound with elastography: Evaluate for hepatic  steatosis and fibrosis. - Capsule endoscopy was completed 01/01/2024, and results are pending. - I will order abdominal x-ray 1 view today.  Check for retained capsule per patient request. - Treatment of fatty liver was discussed. - Continue iron  tablet twice daily with vitamin C.   Ellouise Console, PA-C  Follow up in 4 months with Dr. Abran or TG.

## 2024-01-05 ENCOUNTER — Encounter: Payer: Self-pay | Admitting: Physician Assistant

## 2024-01-05 ENCOUNTER — Other Ambulatory Visit (INDEPENDENT_AMBULATORY_CARE_PROVIDER_SITE_OTHER)

## 2024-01-05 ENCOUNTER — Ambulatory Visit: Payer: Self-pay | Admitting: Physician Assistant

## 2024-01-05 ENCOUNTER — Ambulatory Visit: Admitting: Physician Assistant

## 2024-01-05 ENCOUNTER — Ambulatory Visit (INDEPENDENT_AMBULATORY_CARE_PROVIDER_SITE_OTHER)
Admission: RE | Admit: 2024-01-05 | Discharge: 2024-01-05 | Disposition: A | Discharge status: Home / Self Care | Source: Ambulatory Visit | Attending: Physician Assistant | Admitting: Physician Assistant

## 2024-01-05 VITALS — BP 140/76 | HR 69 | Ht 61.0 in | Wt 143.0 lb

## 2024-01-05 DIAGNOSIS — T189XXA Foreign body of alimentary tract, part unspecified, initial encounter: Secondary | ICD-10-CM | POA: Diagnosis not present

## 2024-01-05 DIAGNOSIS — D509 Iron deficiency anemia, unspecified: Secondary | ICD-10-CM

## 2024-01-05 DIAGNOSIS — D696 Thrombocytopenia, unspecified: Secondary | ICD-10-CM | POA: Diagnosis not present

## 2024-01-05 DIAGNOSIS — K2940 Chronic atrophic gastritis without bleeding: Secondary | ICD-10-CM

## 2024-01-05 DIAGNOSIS — R195 Other fecal abnormalities: Secondary | ICD-10-CM

## 2024-01-05 DIAGNOSIS — R7401 Elevation of levels of liver transaminase levels: Secondary | ICD-10-CM

## 2024-01-05 DIAGNOSIS — K760 Fatty (change of) liver, not elsewhere classified: Secondary | ICD-10-CM

## 2024-01-05 LAB — HEPATIC FUNCTION PANEL
ALT: 32 U/L (ref 0–35)
AST: 41 U/L — ABNORMAL HIGH (ref 0–37)
Albumin: 4.6 g/dL (ref 3.5–5.2)
Alkaline Phosphatase: 63 U/L (ref 39–117)
Bilirubin, Direct: 0.2 mg/dL (ref 0.0–0.3)
Total Bilirubin: 1 mg/dL (ref 0.2–1.2)
Total Protein: 7.3 g/dL (ref 6.0–8.3)

## 2024-01-05 NOTE — Progress Notes (Signed)
 Noted

## 2024-01-05 NOTE — Progress Notes (Signed)
 Notify patient abdominal x-ray shows retained capsule from recent capsule endoscopy done 5 days ago.  Capsule is located in the cecum or asending colon.  This is the first part of the large intestine.  No evidence of bowel obstruction.  No worrisome findings.  I recommend repeat abdominal x-ray 1 view in 1 week to ensure capsule has passed. Ellouise Console, PA-C

## 2024-01-05 NOTE — Patient Instructions (Addendum)
 Your provider has requested that you go to the basement level for lab work before leaving today. Press B on the elevator. The lab is located at the first door on the left as you exit the elevator.  Your provider has requested that you go to the basement level for an X-ray before leaving today. Press B on the elevator. The Radiology/X-ray department is located at the second door on the right as you exit straight off the elevator.  You have been scheduled for an abdominal ultrasound at [facility]iology (1st floor of hospital) on 01/12/24 at 9:30 am. Please arrive 30 minutes prior to your appointment for registration. Make certain not to have anything to eat or drink after midnight prior to your appointment. Should you need to reschedule your appointment, please contact radiology at [phone]. This test typically takes about 30 minutes to perform.  Please follow up sooner if symptoms increase or worsen  Due to recent changes in healthcare laws, you may see the results of your imaging and laboratory studies on MyChart before your provider has had a chance to review them.  We understand that in some cases there may be results that are confusing or concerning to you. Not all laboratory results come back in the same time frame and the provider may be waiting for multiple results in order to interpret others.  Please give us  48 hours in order for your provider to thoroughly review all the results before contacting the office for clarification of your results.   Thank you for trusting me with your gastrointestinal care!   Ellouise Console, PA-C _______________________________________________________  If your blood pressure at your visit was 140/90 or greater, please contact your primary care physician to follow up on this.  _______________________________________________________  If you are age 78 or older, your body mass index should be between 23-30. Your Body mass index is 27.02 kg/m. If this  is out of the aforementioned range listed, please consider follow up with your Primary Care Provider.  If you are age 4 or younger, your body mass index should be between 19-25. Your Body mass index is 27.02 kg/m. If this is out of the aformentioned range listed, please consider follow up with your Primary Care Provider.   ________________________________________________________  The [facility] providers would like to encourage you to use MYCHART to communicate with providers for non-urgent requests or questions.  Due to long hold times on the telephone, sending your provider a message by [facility] may be a faster and more efficient way to get a response.  Please allow 48 business hours for a response.  Please remember that this is for non-urgent requests.  _______________________________________________________

## 2024-01-09 LAB — HEPATITIS B CORE ANTIBODY, TOTAL: Hep B Core Total Ab: NONREACTIVE

## 2024-01-09 LAB — IRON,TIBC AND FERRITIN PANEL
%SAT: 9 % — ABNORMAL LOW (ref 16–45)
Ferritin: 110 ng/mL (ref 16–288)
Iron: 40 ug/dL — ABNORMAL LOW (ref 45–160)
TIBC: 439 ug/dL (ref 250–450)

## 2024-01-09 LAB — CERULOPLASMIN: Ceruloplasmin: 22 mg/dL (ref 14–48)

## 2024-01-09 LAB — ANTI-NUCLEAR AB-TITER (ANA TITER)
ANA TITER: 1:80 {titer} — ABNORMAL HIGH
ANA Titer 1: 1:80 {titer} — ABNORMAL HIGH

## 2024-01-09 LAB — HEPATITIS B CORE ANTIBODY, IGM: Hep B C IgM: NONREACTIVE

## 2024-01-09 LAB — MITOCHONDRIAL ANTIBODIES: Mitochondrial M2 Ab, IgG: <=20 U (ref <=20.0)

## 2024-01-09 LAB — HEPATITIS B SURFACE ANTIBODY,QUALITATIVE: Hep B S Ab: NONREACTIVE

## 2024-01-09 LAB — ANA: Anti Nuclear Antibody (ANA): POSITIVE — AB

## 2024-01-09 LAB — HEPATITIS A ANTIBODY, TOTAL: Hepatitis A AB,Total: REACTIVE — AB

## 2024-01-09 LAB — HEPATITIS C ANTIBODY: Hepatitis C Ab: NONREACTIVE

## 2024-01-09 LAB — HEPATITIS A ANTIBODY, IGM: Hep A IgM: NONREACTIVE

## 2024-01-09 LAB — ANTI-SMOOTH MUSCLE ANTIBODY, IGG: Actin (Smooth Muscle) Antibody (IGG): <20 U (ref <20)

## 2024-01-09 LAB — ALPHA-1-ANTITRYPSIN: A-1 Antitrypsin, Ser: 152 mg/dL (ref 83–199)

## 2024-01-09 NOTE — Progress Notes (Signed)
 I sent lab results through MyChart. 1.  Iron  is still low.  Continue iron  tablet twice daily with vitamin C for another 6 weeks. 2.  Offer hepatitis B vaccine. 3.  Continue with plan for repeat abdominal x-ray next week (to ensure capsule passes). 4.  Capsule endoscopy results are still pending. Ellouise Console, PA-C

## 2024-01-09 NOTE — Telephone Encounter (Signed)
 Inbound call from patient stating capsule has not passed yet and unsure how to proceed. Also requesting an update on what capsule is showing. Requesting a call back. Please advise, thank you

## 2024-01-10 LAB — CELIAC AB TTG DGP TIGA
Antigliadin Abs, IgA: 2 U (ref 0–19)
Gliadin IgG: 1 U (ref 0–19)
IgA/Immunoglobulin A, Serum: 141 mg/dL (ref 64–422)
Tissue Transglut Ab: <2 U/mL (ref 0–5)
Transglutaminase IgA: <2 U/mL (ref 0–3)

## 2024-01-12 ENCOUNTER — Ambulatory Visit: Payer: Self-pay | Admitting: Physician Assistant

## 2024-01-12 ENCOUNTER — Ambulatory Visit (HOSPITAL_COMMUNITY)
Admission: RE | Admit: 2024-01-12 | Discharge: 2024-01-12 | Disposition: A | Discharge status: Home / Self Care | Source: Ambulatory Visit | Attending: Physician Assistant | Admitting: Physician Assistant

## 2024-01-12 DIAGNOSIS — K760 Fatty (change of) liver, not elsewhere classified: Secondary | ICD-10-CM | POA: Insufficient documentation

## 2024-01-12 DIAGNOSIS — T189XXA Foreign body of alimentary tract, part unspecified, initial encounter: Secondary | ICD-10-CM | POA: Diagnosis present

## 2024-01-12 DIAGNOSIS — R195 Other fecal abnormalities: Secondary | ICD-10-CM | POA: Diagnosis present

## 2024-01-12 NOTE — Progress Notes (Signed)
 Notify patient labs show: 1.  Negative celiac labs.  No evidence of celiac disease or gluten allergy. 2.  Positive ANA.  This is very nonspecific and can be seen with many different types of autoimmune diseases.  Liver labs are not consistent with autoimmune hepatitis.  If patient is having any arthritis or joint pain symptoms, then refer to rheumatologist for further evaluation. Ellouise Console, PA-C

## 2024-01-20 ENCOUNTER — Ambulatory Visit (INDEPENDENT_AMBULATORY_CARE_PROVIDER_SITE_OTHER)
Admission: RE | Admit: 2024-01-20 | Discharge: 2024-01-20 | Disposition: A | Discharge status: Home / Self Care | Source: Ambulatory Visit | Attending: Internal Medicine | Admitting: Internal Medicine

## 2024-01-20 ENCOUNTER — Ambulatory Visit

## 2024-01-20 ENCOUNTER — Ambulatory Visit: Payer: Self-pay | Admitting: Internal Medicine

## 2024-01-20 ENCOUNTER — Telehealth: Payer: Self-pay | Admitting: Physician Assistant

## 2024-01-20 DIAGNOSIS — K838 Other specified diseases of biliary tract: Secondary | ICD-10-CM

## 2024-01-20 DIAGNOSIS — Z4689 Encounter for fitting and adjustment of other specified devices: Secondary | ICD-10-CM | POA: Diagnosis not present

## 2024-01-20 DIAGNOSIS — T189XXA Foreign body of alimentary tract, part unspecified, initial encounter: Secondary | ICD-10-CM

## 2024-01-20 NOTE — Telephone Encounter (Signed)
 Michele [name], please advise regarding recently resulted ultrasound.

## 2024-01-20 NOTE — Telephone Encounter (Signed)
 Patient states that she doesn't understand her lab results and is requesting to speak to a nurse.

## 2024-01-21 NOTE — Telephone Encounter (Signed)
 I have spoken to patient to advise that endoscopy pill capsule has finally passed from the small bowel. Discussed large amount of stool in colon suggesting constipation and recommended she take miralax 1-2 capfuls daily long term to prevent additional constipation.  Discussed u/s results and recommendations as per Ellouise Console, PAC.   Patient has been scheduled for MR/MRCP at [facility]iology on 01/28/24 at 830 am, 815 am arrival. NPO 4 hours prior. Patient verbalizes understanding and has been given the number for radiology scheduling in case this appointment needs to be changed for any reason.

## 2024-01-23 ENCOUNTER — Other Ambulatory Visit: Payer: Self-pay | Admitting: Family Medicine

## 2024-01-23 DIAGNOSIS — I10 Essential (primary) hypertension: Secondary | ICD-10-CM

## 2024-01-23 DIAGNOSIS — E782 Mixed hyperlipidemia: Secondary | ICD-10-CM

## 2024-01-26 ENCOUNTER — Ambulatory Visit

## 2024-01-26 ENCOUNTER — Encounter: Payer: Self-pay | Admitting: Podiatry

## 2024-01-26 ENCOUNTER — Telehealth: Payer: Self-pay

## 2024-01-26 ENCOUNTER — Ambulatory Visit: Admitting: Podiatry

## 2024-01-26 ENCOUNTER — Other Ambulatory Visit: Payer: Self-pay

## 2024-01-26 DIAGNOSIS — B351 Tinea unguium: Secondary | ICD-10-CM

## 2024-01-26 NOTE — Telephone Encounter (Signed)
 Patient came in with symptoms of itching and a rash and feet swelling for her appointment for her hep b injection. [place] that this happened since she has been on the iron  supplement 2 times a day. Patient was made aware to stop her iron  supplement until she is symptom free. She can take zytec and benadryl in the meantime and can update us  in 2 weeks regarding her symptoms and regarding her feet swelling she can talk to her PCP about that and we have to reschedule her injection. Patient rescheduled for 02-09-24 for the hep B injection. Please advise

## 2024-01-26 NOTE — Progress Notes (Unsigned)
 Subjective:  Patient ID: Michele [name], female    DOB: [birth year],  MRN: [id number]  Michele [name] Lung presents to clinic today for fungal nail check.  She finished the oral itraconazole  2 weeks ago.  Denies any side effects or adverse reactions from the oral itraconazole .  She has a follow-up appointment with her gastroenterologist today to go over results after swallowing one of the camera capsules.  She notes that her iron  is low and is now taking iron  supplements.  She feels that there has been some improvement to the right great toenail.  She would like to start getting on a regular nail care schedule as she has much difficulty trimming her nails and is diabetic.  Her nails are already trimmed fairly short today.  PCP is Henson, Vickie L, NP-C.  Past Medical History:  Diagnosis Date   Anemia    Anxiety    Arthritis    Carpal tunnel syndrome    Cataract    bilateral,removed   Cerebrovascular disease 01/02/2017   Duodenal diverticulum    Elevated uric acid in blood    Esophageal motility disorder    Fatty liver    GERD (gastroesophageal reflux disease)    Hyperlipidemia    Hypertension    Lymphocytic colitis 09/22/2012   Sleep apnea    Tubulovillous adenoma of colon    Type II or unspecified type diabetes mellitus without mention of complication, not stated as uncontrolled    Yeast detected    Rt big toe   Past Surgical History:  Procedure Laterality Date   APPENDECTOMY     BREAST BIOPSY Right    CARPAL TUNNEL RELEASE Right 1992   cataract extration     COLONOSCOPY     CYST REMOVAL HAND     TONSILLECTOMY  1952   Allergies  Allergen Reactions   Lipitor [Atorvastatin] Other (See Comments)    Elevates LFT's   Review of Systems: Negative except as noted in the HPI.  Objective:  Vascular Examination: Capillary refill time is 3-5 seconds to toes bilateral. Palpable pedal pulses b/l LE. Digital hair present b/l.    Dermatological Examination: Pedal skin with  normal turgor, texture and tone b/l. No open wounds. No interdigital macerations b/l.  The right great toenail is showing proximal clearing approximately 50% of the toenail.     Latest Ref Rng & Units 12/31/2023   11:28 AM 08/29/2023   11:49 AM 02/06/2023   11:05 AM  Hemoglobin A1C  Hemoglobin-A1c 4.6 - 6.5 % 6.4  6.6  6.2    Assessment/Plan: 1. Dermatophytosis of nail    The right hallux nail was debrided with sterile nail nippers and a power debriding burr to decrease bulk/thickness and length.    Informed patient that when she stops taking the itraconazole  medication, it can remain in the soft tissues around the nail border 6 months to 1 year while continuously working on the new nail growth.  We will wait and observe the new nail growth and recheck in 3 to 4 months.   Michele [name], DPM, FACFAS [facility]     [address], [place] [address]  Office [phone]  Fax [phone]

## 2024-01-27 ENCOUNTER — Ambulatory Visit: Payer: PPO | Admitting: Adult Health

## 2024-01-27 ENCOUNTER — Encounter: Payer: Self-pay | Admitting: Adult Health

## 2024-01-27 VITALS — BP 144/82 | HR 52 | Ht 63.0 in | Wt 149.6 lb

## 2024-01-27 DIAGNOSIS — G4733 Obstructive sleep apnea (adult) (pediatric): Secondary | ICD-10-CM

## 2024-01-27 NOTE — Progress Notes (Signed)
 PATIENT: Michele [name] DOB: 1945-08-25  REASON FOR VISIT: follow up HISTORY FROM: patient Primary neurologist: Dr. Chalice  Chief Complaint  Patient presents with   RM 5    Some issues with the machine - taking more water and pulls it off at night    ess 16    HISTORY OF PRESENT ILLNESS: Today 01/27/24:   Michele [name] is a 78 y.o. female with a history of OSA on CPAP. Returns today for follow-up.  She reports that she has been using her machine consistently.  She does state that she went to the beach for a week and forgot a certain parts that she was unable to use it.  She states that she really could not tell a difference when she did not use it.  She does wear the DreamWear mask.  Reports that she has been having testing through GI.  Her download is below      01/27/23: Michele [name] is a 78 y.o. female with a history of OSA on CPAP. Returns today for follow-up.  Reports that CPAP works fairly well.  She states some nights she takes the mask off.  She states that the air coming from the CPAP is too hot.  She returns today for an evaluation.      12/18/21:Michele [name] is a 78 year old female with a history of obstructive sleep apnea on CPAP.  Reports she is still having fatigue but does not fill its related to OSA but has busy she is in the day.  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  She does a lot of cleaning as well.  Her download is below.  She denies any new issues   12/12/20: Michele [name] is a 78 year old female with a history of obstructive sleep apnea on CPAP.  She returns today for follow-up.  She denies any new issues with the CPAP.  She reports that she still has some daytime fatigue but reports that she is very active throughout the day.  They mow 7 yards and they have a garden that they maintain.  Patient also does a lot of canning vegetables.  She returns today for an evaluation.    12/13/19: Michele [name] is a 79 year old female with  a history of obstructive sleep apnea on CPAP.  Her download indicates that she used her machine 29 out of 30 days for compliance of 97%.  She used her machine greater than 4 hours each night.  On average she uses her machine 5 hours and 44 minutes.  Her residual AHI is 1.7 on 5 to 10 cm of water with EPR 3.  Leak in the 95th percentile is 24.1 L/min.  HISTORY 12/10/18 :   Michele [name] is a 78 year old female with a history of obstructive sleep apnea on CPAP.  Her download indicates that she use her machine nightly for compliance of 100%.  She used her machine greater than 4 hours 29 days for compliance of 97%.  On average she uses her machine 6 hours and 8 minutes.  Her residual AHI is 2.5 on 5-10 cmH2O with EPR of 3.  Her leak in the 95th percentile is 27.6 L/min.  She reports that some mornings she will wake up with a headache however she feels that this is irritation from wearing the mask and straps.  She reports that she is not interested in changing the style of mask at this time.  REVIEW OF SYSTEMS: Out of  a complete 14 system review of symptoms, the patient complains only of the following symptoms, and all other reviewed systems are negative.   ESS 9  ALLERGIES: Allergies  Allergen Reactions   Lipitor [Atorvastatin] Other (See Comments)    Elevates LFT's    HOME MEDICATIONS: Outpatient Medications Prior to Visit  Medication Sig Dispense Refill   acetaminophen  (TYLENOL ) 500 MG tablet Take 500 mg by mouth 3 (three) times daily as needed for moderate pain.     aspirin  EC 81 MG tablet Take 81 mg by mouth daily. Swallow whole.     atenolol  (TENORMIN ) 100 MG tablet TAKE 1 TABLET BY MOUTH IN THE MORNING FOR BLOOD PRESSURE 90 tablet 0   CALCIUM  MAGNESIUM  ZINC PO Take by mouth.     cloNIDine  (CATAPRES ) 0.2 MG tablet TAKE 1 TABLET BY MOUTH THREE TIMES DAILY AS NEEDED IF  BLOOD  PRESSURE  IS  OVER  160 270 tablet 0   Cyanocobalamin  (VITAMIN B-12 PO) Take 1 tablet by mouth daily. 5000 mc one  time in the AM PRN     ferrous sulfate 325 (65 FE) MG tablet Take 325 mg by mouth daily with breakfast. (Patient taking differently: Take 325 mg by mouth daily with breakfast. One in the morning and one at night)     metFORMIN  (GLUCOPHAGE -XR) 500 MG 24 hr tablet Take  2 tablets   2 x /day   with Meals  for Diabetes 360 tablet 0   minoxidil  (LONITEN ) 10 MG tablet Take 1 tablet daily for BP 90 tablet 3   olmesartan  (BENICAR ) 40 MG tablet TAKE 1 TABLET BY MOUTH IN THE EVENING FOR BLOOD PRESSURE 90 tablet 0   rosuvastatin  (CRESTOR ) 20 MG tablet TAKE 1 TABLET BY MOUTH IN THE MORNING 90 tablet 0   sertraline  (ZOLOFT ) 100 MG tablet TAKE 1 & 1/2 (ONE & ONE-HALF) TABLETS BY MOUTH ONCE DAILY 45 tablet 0   verapamil  (CALAN -SR) 240 MG CR tablet TAKE 1 TABLET BY MOUTH IN THE EVENING WITH FOOD 90 tablet 0   Vibegron  (GEMTESA ) 75 MG TABS Take 1 tablet (75 mg total) by mouth daily. 30 tablet 2   itraconazole  (SPORANOX ) 100 MG capsule Take 2 capsules (200 mg total) by mouth 2 (two) times daily. Take 2 capsules by mouth twice daily for 7 days, then stop for three weeks.   Repeat this for two additional months. (Patient not taking: Reported on 01/27/2024) 84 capsule 0   Facility-Administered Medications Prior to Visit  Medication Dose Route Frequency Provider Last Rate Last Admin   0.9 %  sodium chloride  infusion  500 mL Intravenous Once Abran Norleen SAILOR, MD        PAST MEDICAL HISTORY: Past Medical History:  Diagnosis Date   Anemia    Anxiety    Arthritis    Carpal tunnel syndrome    Cataract    bilateral,removed   Cerebrovascular disease 01/02/2017   Duodenal diverticulum    Elevated uric acid in blood    Esophageal motility disorder    Fatty liver    GERD (gastroesophageal reflux disease)    Hyperlipidemia    Hypertension    Lymphocytic colitis 09/22/2012   Sleep apnea    Tubulovillous adenoma of colon    Type II or unspecified type diabetes mellitus without mention of complication, not stated as  uncontrolled    Yeast detected    Rt big toe    PAST SURGICAL HISTORY: Past Surgical History:  Procedure Laterality Date   APPENDECTOMY  BREAST BIOPSY Right    CARPAL TUNNEL RELEASE Right 1992   cataract extration     COLONOSCOPY     CYST REMOVAL HAND     endocopy      TONSILLECTOMY  1952    FAMILY HISTORY: Family History  Problem Relation Age of Onset   Heart disease Mother    Diabetes Mother    Heart disease Father    Diabetes Father    Heart attack Father    Cerebral aneurysm Sister    Heart disease Brother    Heart attack Brother    Heart disease Brother        Stent   Colon cancer Neg Hx    Esophageal cancer Neg Hx    Stomach cancer Neg Hx    Rectal cancer Neg Hx    Breast cancer Neg Hx    Sleep apnea Neg Hx    Colon polyps Neg Hx    Crohn's disease Neg Hx    Ulcerative colitis Neg Hx     SOCIAL HISTORY: Social History   Socioeconomic History   Marital status: Married    Spouse name: Seena   Number of children: 1   Years of education: 12   Highest education level: Not on file  Occupational History   Occupation: Retired    Associate Professor: [employer]  Tobacco Use   Smoking status: Never   Smokeless tobacco: Never  Vaping Use   Vaping status: Never Used  Substance and Sexual Activity   Alcohol use: No   Drug use: No   Sexual activity: Not Currently    Partners: Male    Comment: Married  Other Topics Concern   Not on file  Social History Narrative   Lives with husband.  Independent of ADLs. 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   Right-handed   Caffeine: about 1/2 cup of coffee per day, tea and Cokes a few times per week   Social Drivers of Health   Financial Resource Strain: Low Risk  (10/24/2023)   Overall Financial Resource Strain (CARDIA)    Difficulty of Paying Living Expenses: Not hard at all  Food Insecurity: No Food Insecurity (12/22/2023)   Hunger Vital Sign    Worried About Running Out of Food in the Last  Year: Never true    Ran Out of Food in the Last Year: Never true  Transportation Needs: No Transportation Needs (12/22/2023)   PRAPARE - Administrator, Civil Service (Medical): No    Lack of Transportation (Non-Medical): No  Physical Activity: Inactive (10/24/2023)   Exercise Vital Sign    Days of Exercise per Week: 0 days    Minutes of Exercise per Session: 0 min  Stress: Stress Concern Present (10/24/2023)   [employer] of Occupational Health - Occupational Stress Questionnaire    Feeling of Stress : Very much  Social Connections: Socially Integrated (10/24/2023)   Social Connection and Isolation Panel    Frequency of Communication with Friends and Family: More than three times a week    Frequency of Social Gatherings with Friends and Family: More than three times a week    Attends Religious Services: More than 4 times per year    Active Member of [employer] or Organizations: Yes    Attends Banker Meetings: 1 to 4 times per year    Marital Status: Married  Catering manager Violence: Not At Risk (12/22/2023)   Humiliation, Afraid, Rape, and Kick questionnaire  Fear of Current or Ex-Partner: No    Emotionally Abused: No    Physically Abused: No    Sexually Abused: No      PHYSICAL EXAM  Vitals:   01/27/24 1103  BP: (!) 144/82  Pulse: (!) 52  SpO2: 98%  Weight: 149 lb 9.6 oz (67.9 kg)  Height: 5' 3 (1.6 m)      Body mass index is 26.5 kg/m.  Generalized: Well developed, in no acute distress  Chest: Lungs clear to auscultation bilaterally  Neurological examination  Mentation: Alert oriented to time, place, history taking. Follows all commands speech and language fluent Cranial nerve II-XII: Facial symmetry noted   DIAGNOSTIC DATA (LABS, IMAGING, TESTING) - I reviewed patient records, labs, notes, testing and imaging myself where available.  Lab Results  Component Value Date   WBC 5.0 12/31/2023   HGB 12.2 12/31/2023   HCT 37.0  12/31/2023   MCV 79.1 12/31/2023   PLT 143.0 (L) 12/31/2023      Component Value Date/Time   NA 140 12/31/2023 1128   K 4.2 12/31/2023 1128   CL 103 12/31/2023 1128   CO2 29 12/31/2023 1128   GLUCOSE 104 (H) 12/31/2023 1128   BUN 10 12/31/2023 1128   CREATININE 0.75 12/31/2023 1128   CREATININE 1.09 (H) 04/24/2023 1529   CALCIUM  9.4 12/31/2023 1128   PROT 7.3 01/05/2024 1601   ALBUMIN 4.6 01/05/2024 1601   AST 41 (H) 01/05/2024 1601   ALT 32 01/05/2024 1601   ALKPHOS 63 01/05/2024 1601   BILITOT 1.0 01/05/2024 1601   GFRNONAA 40 (L) 10/27/2023 2355   GFRNONAA 68 09/28/2020 1021   GFRAA 79 09/28/2020 1021   Lab Results  Component Value Date   CHOL 115 02/06/2023   HDL 53 02/06/2023   LDLCALC 40 02/06/2023   TRIG 136 02/06/2023   CHOLHDL 2.2 02/06/2023   Lab Results  Component Value Date   HGBA1C 6.4 12/31/2023   Lab Results  Component Value Date   VITAMINB12 693 08/29/2023   Lab Results  Component Value Date   TSH 1.631 10/27/2023      ASSESSMENT AND PLAN 78 y.o. year old female  has a past medical history of Anemia, Anxiety, Arthritis, Carpal tunnel syndrome, Cataract, Cerebrovascular disease (01/02/2017), Duodenal diverticulum, Elevated uric acid in blood, Esophageal motility disorder, Fatty liver, GERD (gastroesophageal reflux disease), Hyperlipidemia, Hypertension, Lymphocytic colitis (09/22/2012), Sleep apnea, Tubulovillous adenoma of colon, Type II or unspecified type diabetes mellitus without mention of complication, not stated as uncontrolled, and Yeast detected. here with:  OSA on CPAP  - CPAP compliance excellent - Good treatment of AHI  - Encourage patient to use CPAP nightly and > 4 hours each night - F/U in 1 year or sooner if needed   Duwaine Russell, MSN, NP-C 01/27/2024, 11:21 AM [facility] [address] [place], [place] [address] [phone]  The patient's condition requires frequent monitoring and adjustments  in the treatment plan, reflecting the ongoing complexity of care.  This provider is the continuing focal point for all needed services for this condition.

## 2024-01-27 NOTE — Patient Instructions (Signed)
 Continue using CPAP nightly and greater than 4 hours each night If your symptoms worsen or you develop new symptoms please let us  know.

## 2024-01-28 ENCOUNTER — Other Ambulatory Visit (HOSPITAL_COMMUNITY)

## 2024-01-29 ENCOUNTER — Ambulatory Visit (HOSPITAL_COMMUNITY)
Admission: RE | Admit: 2024-01-29 | Discharge: 2024-01-29 | Disposition: A | Discharge status: Home / Self Care | Source: Ambulatory Visit | Attending: Physician Assistant | Admitting: Physician Assistant

## 2024-01-29 ENCOUNTER — Other Ambulatory Visit: Payer: Self-pay | Admitting: Physician Assistant

## 2024-01-29 DIAGNOSIS — K838 Other specified diseases of biliary tract: Secondary | ICD-10-CM

## 2024-01-29 MED ORDER — GADOBUTROL 1 MMOL/ML IV SOLN
7.0000 mL | Freq: Once | INTRAVENOUS | Status: AC | PRN
  Administered 2024-01-29: 7 mL via INTRAVENOUS

## 2024-01-30 LAB — HM DIABETES EYE EXAM

## 2024-02-03 ENCOUNTER — Ambulatory Visit: Payer: Self-pay | Admitting: Physician Assistant

## 2024-02-09 ENCOUNTER — Ambulatory Visit (INDEPENDENT_AMBULATORY_CARE_PROVIDER_SITE_OTHER): Admitting: *Deleted

## 2024-02-09 DIAGNOSIS — Z23 Encounter for immunization: Secondary | ICD-10-CM | POA: Diagnosis not present

## 2024-02-16 ENCOUNTER — Encounter: Payer: Self-pay | Admitting: Internal Medicine

## 2024-02-18 ENCOUNTER — Other Ambulatory Visit: Payer: Self-pay | Admitting: Family Medicine

## 2024-02-18 ENCOUNTER — Telehealth: Payer: Self-pay | Admitting: Radiology

## 2024-02-18 ENCOUNTER — Telehealth: Payer: Self-pay | Admitting: Family Medicine

## 2024-02-18 DIAGNOSIS — E1122 Type 2 diabetes mellitus with diabetic chronic kidney disease: Secondary | ICD-10-CM

## 2024-02-18 DIAGNOSIS — E782 Mixed hyperlipidemia: Secondary | ICD-10-CM

## 2024-02-18 DIAGNOSIS — N3281 Overactive bladder: Secondary | ICD-10-CM

## 2024-02-18 DIAGNOSIS — I10 Essential (primary) hypertension: Secondary | ICD-10-CM

## 2024-02-18 DIAGNOSIS — N182 Chronic kidney disease, stage 2 (mild): Secondary | ICD-10-CM

## 2024-02-18 NOTE — Telephone Encounter (Signed)
 Copied from CRM [id number]. Topic: Clinical - Prescription Issue >> Feb 18, 2024  2:29 PM Shereese L wrote: Reason for CRM: Vibegron  (GEMTESA ) 75 MG TABS  Patient is calling to find if there is a cheaper generic brand for the above medication and would like a call back. Patient needs a new monitor & test strips verio one touch

## 2024-02-18 NOTE — Telephone Encounter (Unsigned)
 Copied from CRM [id number]. Topic: Clinical - Medication Refill >> Feb 18, 2024  2:26 PM Shereese L wrote: Medication: rosuvastatin  (CRESTOR ) 20 MG tablet cloNIDine  (CATAPRES ) 0.2 MG tablet atenolol  (TENORMIN ) 100 MG tablet  Has the patient contacted their pharmacy? Yes (Agent: If no, request that the patient contact the pharmacy for the refill. If patient does not wish to contact the pharmacy document the reason why and proceed with request.) (Agent: If yes, when and what did the pharmacy advise?)  This is the patient's preferred pharmacy:  [facility] [address], [place] - [address] [place] [address] Phone: [phone] Fax: [phone]  Is this the correct pharmacy for this prescription? Yes If no, delete pharmacy and type the correct one.   Has the prescription been filled recently? Yes  Is the patient out of the medication? Yes  Has the patient been seen for an appointment in the last year OR does the patient have an upcoming appointment? Yes  Can we respond through MyChart? Yes  Agent: Please be advised that Rx refills may take up to 3 business days. We ask that you follow-up with your pharmacy.

## 2024-02-18 NOTE — Telephone Encounter (Signed)
 Copied from CRM [id number]. Topic: Clinical - Medication Question >> Feb 18, 2024 11:14 AM Dedra B wrote: Reason for CRM: Pt called to follow up on request for test strips and glucose monitor sent by [facility]. Pt couldn't remember the name of the strips but will call back with that information when she gets home.

## 2024-02-19 MED ORDER — ROSUVASTATIN CALCIUM 20 MG PO TABS
20.0000 mg | ORAL_TABLET | Freq: Every morning | ORAL | 0 refills | Status: DC
  Filled 2024-04-26: qty 90, 90d supply, fill #0

## 2024-02-19 MED ORDER — ATENOLOL 100 MG PO TABS
100.0000 mg | ORAL_TABLET | Freq: Every morning | ORAL | 0 refills | Status: DC
  Filled 2024-04-26: qty 90, 90d supply, fill #0

## 2024-02-19 MED ORDER — CLONIDINE HCL 0.2 MG PO TABS
ORAL_TABLET | ORAL | 0 refills | Status: AC
Start: 2024-02-19 — End: ?

## 2024-02-19 NOTE — Telephone Encounter (Signed)
 Please see other encounter.

## 2024-02-19 NOTE — Telephone Encounter (Signed)
 Is there an alternative or cheaper generic for Gemtesa  medication?

## 2024-02-20 MED ORDER — BLOOD GLUCOSE TEST VI STRP: ORAL_STRIP | 12 refills | Status: DC

## 2024-02-20 MED ORDER — LANCET DEVICE MISC: 0 refills | Status: AC

## 2024-02-20 MED ORDER — LANCETS MISC. MISC: 12 refills | Status: AC

## 2024-02-20 MED ORDER — OXYBUTYNIN CHLORIDE ER 10 MG PO TB24
10.0000 mg | ORAL_TABLET | Freq: Every day | ORAL | 1 refills | Status: DC
Start: 2024-02-20 — End: 2024-04-13

## 2024-02-20 MED ORDER — BLOOD GLUCOSE MONITORING SUPPL DEVI: 0 refills | Status: DC

## 2024-02-20 NOTE — Telephone Encounter (Signed)
 Called pt and she reports the oxybutin was more affordable, rx was sent and discontinued the gemtesa . Pt has upcoming appt w urogynecology in November

## 2024-02-20 NOTE — Addendum Note (Signed)
 Addended by: Denielle Bayard E on: 02/20/2024 09:17 AM   Modules accepted: Orders

## 2024-02-23 ENCOUNTER — Ambulatory Visit: Payer: PPO | Admitting: Nurse Practitioner

## 2024-02-23 ENCOUNTER — Telehealth: Payer: Self-pay | Admitting: Adult Health

## 2024-02-23 DIAGNOSIS — G4733 Obstructive sleep apnea (adult) (pediatric): Secondary | ICD-10-CM

## 2024-02-23 NOTE — Telephone Encounter (Signed)
 Pt returned call. Please call back when available.

## 2024-02-23 NOTE — Telephone Encounter (Signed)
 I called and LMVM for pt (home) dpr ok, that if new machine order will get a new sleep study  (HST usually) to assess if anything has changed.  This done prior to ordering a new machine, but I see last DL date was [phone], so did not know if machine had stopped at this time.  Please give us  a call back.

## 2024-02-23 NOTE — Telephone Encounter (Signed)
 Pt's CPAP is no longer working, she would like for [employer], NP to move forward with ordering a new CPAP for her

## 2024-02-24 NOTE — Addendum Note (Signed)
 Addended by: SHERRYL DUWAINE SQUIBB on: 02/24/2024 03:27 PM   Modules accepted: Orders

## 2024-02-24 NOTE — Telephone Encounter (Signed)
 I have placed an order for a new machine in for HST.

## 2024-02-24 NOTE — Telephone Encounter (Signed)
 I called pt.  She said her machine stopped working 2 nights ago.  She is ok to have another sleep study (when I relayed that when we order new machines and if been over 5 yrs since last sleep study then will get one and see if any changes need to be made).  She prefers phone call as her computer does not work well at times.  I relayed that since not using that MM/NP may go ahead and order, but I will let her know what the status is when I hear back.  She appreciated call back.

## 2024-02-25 ENCOUNTER — Telehealth: Payer: Self-pay | Admitting: Adult Health

## 2024-02-25 NOTE — Telephone Encounter (Signed)
 I called pt and relayed to her that HST order and also new machine order placed.  [facility] should call her about new machine and Sleep lab also as well.  She verbalized understanding.  Resent order message to [facility], since she is not able to use machine (high priority sent).

## 2024-02-25 NOTE — Telephone Encounter (Signed)
 New machine order sent to [facility].

## 2024-02-25 NOTE — Telephone Encounter (Signed)
 HST- HTA pending

## 2024-02-26 NOTE — Telephone Encounter (Signed)
 New, Maryella Shivers, Otilio Jefferson, RN; Alain Honey; Jeris Penta, [place]; 1 other Received, thank you!

## 2024-02-26 NOTE — Telephone Encounter (Signed)
 revious Messages    ----- Message ----- From: Neysa Nena RAMAN, RN Sent: 02/25/2024   2:20 PM EDT To: Adine Leer; Avelina Sprung; Ephraim Dollar* Subject: new machine, pt old machine broken            This pts machine broke,  needs a new one.  Order I think already sent was just following up.  Michele [name] Female, 78 y.o., 03/25/1946 MRN: [id number] Phone: [phone]   Thank you.    [place]

## 2024-02-27 ENCOUNTER — Telehealth: Payer: Self-pay | Admitting: Radiology

## 2024-02-27 DIAGNOSIS — E1122 Type 2 diabetes mellitus with diabetic chronic kidney disease: Secondary | ICD-10-CM

## 2024-02-27 MED ORDER — ONETOUCH VERIO REFLECT W/DEVICE KIT: PACK | 11 refills | Status: DC

## 2024-02-27 NOTE — Telephone Encounter (Signed)
 Copied from CRM #[id number]. Topic: Clinical - Prescription Issue >> Feb 27, 2024 10:17 AM Carlyon D wrote: Reason for CRM: pt calling in regards to prescription issue, she states pharmacy stated pcp did not specify which monitor ( One touch Vero reflect) pt is asking is this can be corrected please and re sent over to the pharmacy

## 2024-02-27 NOTE — Addendum Note (Signed)
 Addended by: Reilley Latorre E on: 02/27/2024 11:20 AM   Modules accepted: Orders

## 2024-02-27 NOTE — Telephone Encounter (Signed)
 Rx sent.

## 2024-03-01 ENCOUNTER — Ambulatory Visit

## 2024-03-03 NOTE — Telephone Encounter (Signed)
 HST HTA shara: [id number] (exp. 02/25/24 to 05/25/24)

## 2024-03-04 NOTE — Telephone Encounter (Signed)
 Patient said have not received a call about HST or new machine. Gave patient the phone number for [facility].

## 2024-03-08 ENCOUNTER — Ambulatory Visit: Admitting: *Deleted

## 2024-03-08 ENCOUNTER — Other Ambulatory Visit (HOSPITAL_COMMUNITY): Payer: Self-pay

## 2024-03-08 DIAGNOSIS — Z23 Encounter for immunization: Secondary | ICD-10-CM

## 2024-03-09 ENCOUNTER — Other Ambulatory Visit: Payer: Self-pay | Admitting: Family Medicine

## 2024-03-09 DIAGNOSIS — E1122 Type 2 diabetes mellitus with diabetic chronic kidney disease: Secondary | ICD-10-CM

## 2024-03-09 DIAGNOSIS — Z1231 Encounter for screening mammogram for malignant neoplasm of breast: Secondary | ICD-10-CM

## 2024-03-09 NOTE — Telephone Encounter (Unsigned)
 Copied from CRM [id number]. Topic: Clinical - Medication Refill >> Mar 09, 2024  3:30 PM Rosina BIRCH wrote: Medication: glucose blood test strips and patient want a new monitor  Has the patient contacted their pharmacy? Yes (Agent: If no, request that the patient contact the pharmacy for the refill. If patient does not wish to contact the pharmacy document the reason why and proceed with request.) (Agent: If yes, when and what did the pharmacy advise?)  This is the patient's preferred pharmacy:  [facility] [address] [place] - [address] [place] [address] Phone: [phone] Fax: [phone]  Is this the correct pharmacy for this prescription? Yes If no, delete pharmacy and type the correct one.   Has the prescription been filled recently? Yes  Is the patient out of the medication? Yes  Has the patient been seen for an appointment in the last year OR does the patient have an upcoming appointment? Yes  Can we respond through MyChart? Yes  Agent: Please be advised that Rx refills may take up to 3 business days. We ask that you follow-up with your pharmacy.

## 2024-03-10 MED ORDER — BLOOD GLUCOSE TEST VI STRP: ORAL_STRIP | 12 refills | Status: DC

## 2024-03-10 MED ORDER — ONETOUCH VERIO REFLECT W/DEVICE KIT: PACK | 11 refills | Status: DC

## 2024-03-15 ENCOUNTER — Ambulatory Visit (HOSPITAL_BASED_OUTPATIENT_CLINIC_OR_DEPARTMENT_OTHER)
Admission: RE | Admit: 2024-03-15 | Discharge: 2024-03-15 | Disposition: A | Discharge status: Home / Self Care | Source: Ambulatory Visit | Attending: Family Medicine | Admitting: Family Medicine

## 2024-03-15 ENCOUNTER — Other Ambulatory Visit: Payer: Self-pay | Admitting: Family Medicine

## 2024-03-15 ENCOUNTER — Ambulatory Visit: Payer: Self-pay | Admitting: Family Medicine

## 2024-03-15 DIAGNOSIS — M8589 Other specified disorders of bone density and structure, multiple sites: Secondary | ICD-10-CM | POA: Insufficient documentation

## 2024-03-17 ENCOUNTER — Ambulatory Visit: Admitting: Neurology

## 2024-03-17 DIAGNOSIS — G4733 Obstructive sleep apnea (adult) (pediatric): Secondary | ICD-10-CM | POA: Diagnosis not present

## 2024-03-18 ENCOUNTER — Other Ambulatory Visit (HOSPITAL_COMMUNITY): Payer: Self-pay

## 2024-03-18 ENCOUNTER — Other Ambulatory Visit: Payer: Self-pay

## 2024-03-18 DIAGNOSIS — E1122 Type 2 diabetes mellitus with diabetic chronic kidney disease: Secondary | ICD-10-CM

## 2024-03-18 MED ORDER — ONETOUCH VERIO VI STRP
ORAL_STRIP | 12 refills | Status: DC
  Filled 2024-03-18: qty 100, 100d supply, fill #0

## 2024-03-18 NOTE — Progress Notes (Signed)
 [facility]  MRN: [id number]    HOME SLEEP TEST REPORT ( by Watch PAT)   STUDY DATE:  03-17-2024    ORDERING CLINICIAN:  Duwaine Russell, NP  REFERRING CLINICIAN:    CLINICAL INFORMATION/HISTORY: OSA on CPAP , 5-15 cm water EPR 3 cm , residual  2.5/h.  01/27/24:    Michele [name] is a 78 y.o. female with a history of OSA on CPAP. Returns today for follow-up.  She reports that she has been using her machine consistently.  She does state that she went to the beach for a week and forgot a certain parts that she was unable to use it.  She states that she really could not tell a difference when she did not use it.  She does wear the DreamWear mask.    Epworth sleepiness score: FSS 16/63. ESS 9/ 24    BMI: 26.5  kg/m   Neck Circumference: X    FINDINGS:   Sleep Summary:   Total Recording Time (hours, min):    9 h 27 m    Total Sleep Time (hours, min):  7 h 46              Percent REM (%):     7.4%                                   Respiratory Indices:   Calculated pAHI (per AASM or CMS guideline):  24.7/h   10.3/h centrals                      REM pAHI:   60.5                                              NREM pAHI:  22                           Positional AHI:  supine 67/h     Snoring:   45 dB                                             Oxygen Saturation Statistics:   Oxygen Saturation (%) Mean:  94%            O2 Saturation Range (%):     88- 63                                   O2 Saturation (minutes) <89%:  6.2 minutes          Pulse Rate Statistics:   Pulse Mean (bpm):   63 bpm              Pulse Range:   54-67 bpm               IMPRESSION:  This HST confirms the presence of moderate sleep apnea, 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    RECOMMENDATION: Continuation of CPAP treatment should be considered.  This apnea is REM sleep  dominant and cannot be corrected by non-PAP therapy. If the patient needs  a new CPAP machine, please set to the last used pressures.  Current settings auto CPAP ResMed  at 5-10 cm water, 3 cm EPR,  residual AHI 2.5 /h.    Any patient should be cautioned not to drive, work at heights, or operate dangerous or heavy equipment when tired or sleepy.   Review of good sleep hygiene measures is accessible to any sleep clinic patient and can be reiterated through online material- I we recommend the Guide to better Sleep   by the NIH.   Weight loss and Core Strength improvement is highly recommended for individuals with low muscle tone and/ or a BMI over 30.  Any CPAP patient should be reminded to be fully compliant with PAP therapy , (defined as using PAP therapy for more than 4 hours each night ) with the goal to improve sleep related symptoms and decrease long term cardiovascular risks. Any PAP therapy patient should be reminded, that it may take up to 3 months to get fully used to using PAP and it may take 1-2 weeks for an established CPAP user to acclimatize to changes in pressure or mask. The earlier full compliance is achieved, the better long term compliance tends to be.   Please note that untreated obstructive sleep apnea may carry additional perioperative morbidity. Patients with significant obstructive sleep apnea should receive perioperative PAP therapy and the surgical team should be informed of the diagnosis and degree of sleep disordered breathing.  Sleep fragmentation in the presence of normal proportional sleep stages is a nonspecific findings and per se does not signify an intrinsic sleep disorder or a cause for the patient's sleep-related symptoms.  Causes include (but are not limited to) the unfamiliarity of sleeping while recorded by HST device or sleeping in a sleep lab for a full Polysomnography sleep study, but also circadian rhythm disturbances, medication side effects or an underlying mood disorder or medical problem.   The referring physician will be  notified of the test results.       INTERPRETING PHYSICIAN:  Dedra Gores, MD  [facility] Sleep Board certified by The [employer] of Sleep Medicine and Diplomate of the [employer] of Sleep Medicine. Board certified In Neurology through the ABPN, Fellow of the [employer] of Neurology.

## 2024-03-19 ENCOUNTER — Other Ambulatory Visit (HOSPITAL_COMMUNITY): Payer: Self-pay

## 2024-03-22 ENCOUNTER — Other Ambulatory Visit: Payer: Self-pay

## 2024-03-23 ENCOUNTER — Telehealth: Payer: Self-pay | Admitting: Adult Health

## 2024-03-23 ENCOUNTER — Ambulatory Visit

## 2024-03-23 ENCOUNTER — Inpatient Hospital Stay: Attending: Nurse Practitioner

## 2024-03-23 DIAGNOSIS — D509 Iron deficiency anemia, unspecified: Secondary | ICD-10-CM | POA: Insufficient documentation

## 2024-03-23 DIAGNOSIS — D508 Other iron deficiency anemias: Secondary | ICD-10-CM

## 2024-03-23 LAB — IRON AND IRON BINDING CAPACITY (CC-WL,HP ONLY)
Iron: 50 ug/dL (ref 28–170)
Saturation Ratios: 12 % (ref 10.4–31.8)
TIBC: 405 ug/dL (ref 250–450)
UIBC: 355 ug/dL (ref 148–442)

## 2024-03-23 LAB — CBC WITH DIFFERENTIAL (CANCER CENTER ONLY)
Abs Immature Granulocytes: 0.01 K/uL (ref 0.00–0.07)
Basophils Absolute: 0 K/uL (ref 0.0–0.1)
Basophils Relative: 1 %
Eosinophils Absolute: 0.1 K/uL (ref 0.0–0.5)
Eosinophils Relative: 2 %
HCT: 38 % (ref 36.0–46.0)
Hemoglobin: 12.5 g/dL (ref 12.0–15.0)
Immature Granulocytes: 0 %
Lymphocytes Relative: 31 %
Lymphs Abs: 1.6 K/uL (ref 0.7–4.0)
MCH: 26.3 pg (ref 26.0–34.0)
MCHC: 32.9 g/dL (ref 30.0–36.0)
MCV: 80 fL (ref 80.0–100.0)
Monocytes Absolute: 0.6 K/uL (ref 0.1–1.0)
Monocytes Relative: 11 %
Neutro Abs: 2.9 K/uL (ref 1.7–7.7)
Neutrophils Relative %: 55 %
Platelet Count: 141 K/uL — ABNORMAL LOW (ref 150–400)
RBC: 4.75 MIL/uL (ref 3.87–5.11)
RDW: 14.9 % (ref 11.5–15.5)
WBC Count: 5.3 K/uL (ref 4.0–10.5)
nRBC: 0 % (ref 0.0–0.2)

## 2024-03-23 LAB — RETIC PANEL
Immature Retic Fract: 15.8 % (ref 2.3–15.9)
RBC.: 4.71 MIL/uL (ref 3.87–5.11)
Retic Count, Absolute: 58.9 K/uL (ref 19.0–186.0)
Retic Ct Pct: 1.3 % (ref 0.4–3.1)
Reticulocyte Hemoglobin: 30.8 pg (ref >27.9)

## 2024-03-23 LAB — FERRITIN: Ferritin: 330 ng/mL — ABNORMAL HIGH (ref 11–307)

## 2024-03-23 NOTE — Telephone Encounter (Signed)
 Pt was scheduled for her initial CPAP visit on 06/08/24 Pt was informed to bring machine and power cord to the appointment.  DME and between dates are in pt's SnapShot.

## 2024-03-26 ENCOUNTER — Ambulatory Visit: Payer: Self-pay | Admitting: Nurse Practitioner

## 2024-03-30 ENCOUNTER — Telehealth: Payer: Self-pay

## 2024-03-30 NOTE — Telephone Encounter (Signed)
We did not call pt. 

## 2024-03-30 NOTE — Telephone Encounter (Unsigned)
 Copied from CRM [id number]. Topic: General - Other >> Mar 30, 2024  9:54 AM Michele [name] HERO wrote: Reason for CRM: patient calling back and she doesn't know why she was calling no vmail was left.

## 2024-04-01 NOTE — Procedures (Signed)
 [facility]  MRN: [id number]    HOME SLEEP TEST REPORT ( by Watch PAT)   STUDY DATE:  03-17-2024    ORDERING CLINICIAN:  Duwaine Russell, NP  REFERRING CLINICIAN:    CLINICAL INFORMATION/HISTORY: OSA on CPAP , 5-15 cm water EPR 3 cm , residual  2.5/h.  01/27/24:    Michele [name] is a 78 y.o. female with a history of OSA on CPAP. Returns today for follow-up.  She reports that she has been using her machine consistently.  She does state that she went to the beach for a week and forgot a certain parts that she was unable to use it.  She states that she really could not tell a difference when she did not use it.  She does wear the DreamWear mask.    Epworth sleepiness score: FSS 16/63. ESS 9/ 24    BMI: 26.5  kg/m   Neck Circumference: X    FINDINGS:   Sleep Summary:   Total Recording Time (hours, min):    9 h 27 m    Total Sleep Time (hours, min):  7 h 46              Percent REM (%):     7.4%                                   Respiratory Indices:   Calculated pAHI (per AASM or CMS guideline):  24.7/h   10.3/h centrals                      REM pAHI:   60.5                                              NREM pAHI:  22                           Positional AHI:  supine 67/h     Snoring:   45 dB                                             Oxygen Saturation Statistics:   Oxygen Saturation (%) Mean:  94%            O2 Saturation Range (%):     88- 63                                   O2 Saturation (minutes) <89%:  6.2 minutes          Pulse Rate Statistics:   Pulse Mean (bpm):   63 bpm              Pulse Range:   54-67 bpm               IMPRESSION:  This HST confirms the presence of moderate sleep apnea, 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    RECOMMENDATION: Continuation of CPAP treatment should be considered.  This apnea is REM sleep  dominant and cannot be corrected by non-PAP therapy. If the patient needs  a new CPAP machine, please set to the last used pressures.  Current settings auto CPAP ResMed  at 5-10 cm water, 3 cm EPR,  residual AHI 2.5 /h.    Any patient should be cautioned not to drive, work at heights, or operate dangerous or heavy equipment when tired or sleepy.   Review of good sleep hygiene measures is accessible to any sleep clinic patient and can be reiterated through online material- I we recommend the Guide to better Sleep   by the NIH.   Weight loss and Core Strength improvement is highly recommended for individuals with low muscle tone and/ or a BMI over 30.  Any CPAP patient should be reminded to be fully compliant with PAP therapy , (defined as using PAP therapy for more than 4 hours each night ) with the goal to improve sleep related symptoms and decrease long term cardiovascular risks. Any PAP therapy patient should be reminded, that it may take up to 3 months to get fully used to using PAP and it may take 1-2 weeks for an established CPAP user to acclimatize to changes in pressure or mask. The earlier full compliance is achieved, the better long term compliance tends to be.   Please note that untreated obstructive sleep apnea may carry additional perioperative morbidity. Patients with significant obstructive sleep apnea should receive perioperative PAP therapy and the surgical team should be informed of the diagnosis and degree of sleep disordered breathing.  Sleep fragmentation in the presence of normal proportional sleep stages is a nonspecific findings and per se does not signify an intrinsic sleep disorder or a cause for the patient's sleep-related symptoms.  Causes include (but are not limited to) the unfamiliarity of sleeping while recorded by HST device or sleeping in a sleep lab for a full Polysomnography sleep study, but also circadian rhythm disturbances, medication side effects or an underlying mood disorder or medical problem.   The referring physician will be  notified of the test results.       INTERPRETING PHYSICIAN:  Dedra Gores, MD  [facility] Sleep Board certified by The [employer] of Sleep Medicine and Diplomate of the [employer] of Sleep Medicine. Board certified In Neurology through the ABPN, Fellow of the [employer] of Neurology.                 [facility] [place]  MRN: [id number]    HOME SLEEP TEST REPORT ( by Watch PAT)   STUDY DATE:  03-17-2024    ORDERING CLINICIAN:  Duwaine Russell, NP  REFERRING CLINICIAN:    CLINICAL INFORMATION/HISTORY: OSA on CPAP , 5-15 cm water EPR 3 cm , residual  2.5/h.  01/27/24:    Michele [name] is a 78 y.o. female with a history of OSA on CPAP. Returns today for follow-up.  She reports that she has been using her machine consistently.  She does state that she went to the beach for a week and forgot a certain parts that she was unable to use it.  She states that she really could not tell a difference when she did not use it.  She does wear the DreamWear mask.     Epworth sleepiness score: FSS 16/63. ESS 9/ 24    BMI: 26.5  kg/m   Neck Circumference: X    FINDINGS:   Sleep Summary:   Total  Recording Time (hours, min):    9 h 27 m    Total Sleep Time (hours, min):  7 h 46              Percent REM (%):     7.4%                                   Respiratory Indices:   Calculated pAHI (per AASM or CMS guideline):  24.7/h   10.3/h centrals                      REM pAHI:   60.5                                              NREM pAHI:  22                           Positional AHI:  supine 67/h      Snoring:   45 dB                                             Oxygen Saturation Statistics:   Oxygen Saturation (%) Mean:  94%             O2 Saturation Range (%):     88- 63                                   O2 Saturation (minutes) <89%:  6.2 minutes          Pulse Rate Statistics:   Pulse Mean  (bpm):   63 bpm              Pulse Range:   54-67 bpm               IMPRESSION:  This HST confirms the presence of moderate sleep apnea, 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    RECOMMENDATION: Continuation of CPAP treatment should be considered.  This apnea is REM sleep dominant and cannot be corrected by non-PAP therapy.  If the patient needs a new CPAP machine, please set to the last used pressures.  Current settings auto CPAP ResMed  at 5-10 cm water, 3 cm EPR,  residual AHI 2.5 /h.  Rv with [place] in 10-12 weeks.      Any patient should be cautioned not to drive, work at heights, or operate dangerous or heavy equipment when tired or sleepy.    Review of good sleep hygiene measures is accessible to any sleep clinic patient and can be reiterated through online material- I we recommend the Guide to better Sleep   by the NIH.    Weight loss and Core Strength improvement is highly recommended for individuals with low muscle tone and/ or a BMI over 30.  Any CPAP patient should be reminded to be fully compliant with PAP therapy , (defined as using PAP therapy for more than 4 hours each night ) with the goal to improve sleep related symptoms and decrease long term cardiovascular risks. Any PAP  therapy patient should be reminded, that it may take up to 3 months to get fully used to using PAP and it may take 1-2 weeks for an established CPAP user to acclimatize to changes in pressure or mask. The earlier full compliance is achieved, the better long term compliance tends to be.    Please note that untreated obstructive sleep apnea may carry additional perioperative morbidity. Patients with significant obstructive sleep apnea should receive perioperative PAP therapy and the surgical team should be informed of the diagnosis and degree of sleep disordered breathing.  Sleep fragmentation in the presence of normal proportional sleep stages is a nonspecific findings and per se does not  signify an intrinsic sleep disorder or a cause for the patient's sleep-related symptoms.  Causes include (but are not limited to) the unfamiliarity of sleeping while recorded by HST device or sleeping in a sleep lab for a full Polysomnography sleep study, but also circadian rhythm disturbances, medication side effects or an underlying mood disorder or medical problem.    The referring physician will be notified of the test results.          INTERPRETING PHYSICIAN:   Dedra Gores, MD  [facility] Sleep Board certified by The [employer] of Sleep Medicine and Diplomate of the [employer] of Sleep Medicine. Board certified In Neurology through the ABPN, Fellow of the [employer] Neu[employer].

## 2024-04-02 ENCOUNTER — Ambulatory Visit: Payer: Self-pay | Admitting: Adult Health

## 2024-04-08 ENCOUNTER — Ambulatory Visit
Admission: RE | Admit: 2024-04-08 | Discharge: 2024-04-08 | Disposition: A | Discharge status: Home / Self Care | Source: Ambulatory Visit | Attending: Family Medicine | Admitting: Family Medicine

## 2024-04-08 DIAGNOSIS — Z1231 Encounter for screening mammogram for malignant neoplasm of breast: Secondary | ICD-10-CM

## 2024-04-08 NOTE — Telephone Encounter (Signed)
 I called the patient and LVM (ok per DPR) informing patient that her HST showed moderate sleep apnea. Informed pt that [facility] NP recommends continued cpap use. I see pt was setup on her new machine on 10/15. Asked pt to call us  if she has any issues. Left office number in message.

## 2024-04-08 NOTE — Telephone Encounter (Addendum)
 Message sent to patient in her Mychart as per Mayme Silversmith NP.   ----- Message from Lacie K Burton sent at 03/26/2024 11:17 AM EDT ----- Please let pt know labs - IDA resolved. She has responded very well to oral iron . Please decrease oral iron  to 1 tab daily until next lab/fup in 4 months. If she was already taking once a day, she  can stop it altogether.   Thanks Lacie NP ----- Message ----- From: Rebecka, Lab In [place] Sent: 03/23/2024  11:50 AM EDT To: Lacie K Burton, NP

## 2024-04-13 ENCOUNTER — Other Ambulatory Visit (HOSPITAL_COMMUNITY): Payer: Self-pay

## 2024-04-13 ENCOUNTER — Other Ambulatory Visit: Payer: Self-pay

## 2024-04-13 ENCOUNTER — Ambulatory Visit: Admitting: Obstetrics and Gynecology

## 2024-04-13 ENCOUNTER — Encounter: Payer: Self-pay | Admitting: Obstetrics and Gynecology

## 2024-04-13 VITALS — BP 171/69 | HR 71 | Ht 61.5 in | Wt 145.4 lb

## 2024-04-13 DIAGNOSIS — R350 Frequency of micturition: Secondary | ICD-10-CM

## 2024-04-13 DIAGNOSIS — N393 Stress incontinence (female) (male): Secondary | ICD-10-CM | POA: Diagnosis not present

## 2024-04-13 DIAGNOSIS — N3281 Overactive bladder: Secondary | ICD-10-CM

## 2024-04-13 LAB — POCT URINALYSIS DIP (CLINITEK)
Bilirubin, UA: NEGATIVE
Blood, UA: NEGATIVE
Glucose, UA: NEGATIVE mg/dL
Ketones, POC UA: NEGATIVE mg/dL
Leukocytes, UA: NEGATIVE
Nitrite, UA: NEGATIVE
POC PROTEIN,UA: NEGATIVE
Spec Grav, UA: 1.01 (ref 1.010–1.025)
Urobilinogen, UA: 0.2 U/dL
pH, UA: 6.5 (ref 5.0–8.0)

## 2024-04-13 MED ORDER — VIBEGRON 75 MG PO TABS
75.0000 mg | ORAL_TABLET | Freq: Every day | ORAL | 5 refills | Status: AC
  Filled 2024-04-13 – 2024-06-24 (×6): qty 30, 30d supply, fill #0

## 2024-04-13 MED ORDER — VIBEGRON 75 MG PO TABS
75.0000 mg | ORAL_TABLET | Freq: Every day | ORAL | 5 refills | Status: DC
  Filled 2024-04-13: qty 30, 30d supply, fill #0

## 2024-04-13 NOTE — Patient Instructions (Addendum)
 Consider options for leakage with cough and sneeze: Urethral bulking, pessary, or surgical sling  For the leakage with feeling like you have rush to get there please consider the options of Medication, Bladder botox, and PTNS   Today we talked about ways to manage bladder urgency such as altering your diet to avoid irritative beverages and foods (bladder diet) as well as attempting to decrease stress and other exacerbating factors.    The Most Bothersome Foods* The Least Bothersome Foods*  Coffee - Regular & Decaf Tea - caffeinated Carbonated beverages - cola, non-colas, diet & caffeine-free Alcohols - Beer, Red Wine, White Wine, [address] - Grapefruit, [place], Orange, [employer] - Cranberry, Grapefruit, Orange, Pineapple Vegetables - Tomato & Tomato Products Flavor Enhancers - Hot peppers, Spicy foods, Chili, Horseradish, Vinegar, Monosodium glutamate (MSG) Artificial Sweeteners - NutraSweet, Sweet 'N Low, Equal (sweetener), Saccharin Ethnic foods - Mexican, Thai, Indian food [employer] - low-fat & whole Fruits - Bananas, Blueberries, Honeydew melon, Pears, Raisins, Watermelon Vegetables - Broccoli, [address] Sprouts, [place], Carrots, Cauliflower, [place], Cucumber, Mushrooms, Peas, Radishes, Squash, Zucchini, White potatoes, Sweet potatoes & yams Poultry - Chicken, Eggs, Turkey, [employer] - Beef, [employer]ational [employer], Lamb Seafood - Shrimp, [place] fish, Salmon Grains - Oat, Rice Snacks - Pretzels, Popcorn  *Mitch ALF et al. Diet and its role in interstitial cystitis/bladder pain syndrome (IC/BPS) and comorbid conditions. [employer]. BJU Int. 2012 Jan 11.

## 2024-04-13 NOTE — Progress Notes (Unsigned)
 [facility] New Patient Evaluation and Consultation  Referring Provider: Lendia Boby CROME, NP-C PCP: Lendia Boby CROME, NP-C Date of Service: 04/13/2024  SUBJECTIVE Chief Complaint: New Patient (Initial Visit) Michele [name] is a 78 y.o. female is here for OAB and urinary incontinence.)  History of Present Illness: Michele [name] is a 78 y.o. White or Caucasian female seen in consultation at the request of Dr. Lendia for evaluation of incontinence.    Review of records significant for: Hx of oxybutynin  use Has tried Myrbetriq   Urinary Symptoms: Leaks urine with cough/ sneeze, lifting, going from sitting to standing, with movement to the bathroom, and with urgency Leaks 5+ time(s) per days.  Pad use: 5 pads per day.   Patient is bothered by UI symptoms.  Day time voids 5-7.  Nocturia: 2-4 times per night to void. Voiding dysfunction:  does not empty bladder well.  Patient does not use a catheter to empty bladder.  When urinating, patient feels dribbling after finishing Drinks: 48oz Water, occasional tea, occasional zero sugar Dr. Nunzio per day  UTIs: 0 UTI's in the last year.   Denies history of urologic concerns No results found for the last 90 days.   Pelvic Organ Prolapse Symptoms:                  Patient Denies a feeling of a bulge the vaginal area.   Bowel Symptom: Bowel movements: 1-2 time(s) per day Stool consistency: soft  Straining: no.  Splinting: no.  Incomplete evacuation: no.  Patient Denies accidental bowel leakage / fecal incontinence Bowel regimen: none Last colonoscopy: Date 2024, Results WNL HM Colonoscopy          Completed or No Longer Recommended     Colonoscopy  Discontinued      Frequency changed to Never automatically (Topic No Longer Applies)   03/27/2023  COLONOSCOPY   Only the first 1 history entries have been loaded, but more history exists.                Sexual Function Sexually active: no.  Sexual  orientation: Straight Pain with sex: No  Pelvic Pain Denies pelvic pain    Past Medical History:  Past Medical History:  Diagnosis Date   Anemia    Anxiety    Arthritis    Carpal tunnel syndrome    Cataract    bilateral,removed   Cerebrovascular disease 01/02/2017   Duodenal diverticulum    Elevated uric acid in blood    Esophageal motility disorder    Fatty liver    GERD (gastroesophageal reflux disease)    Hyperlipidemia    Hypertension    Lymphocytic colitis 09/22/2012   Sleep apnea    Tubulovillous adenoma of colon    Type II or unspecified type diabetes mellitus without mention of complication, not stated as uncontrolled    Yeast detected    Rt big toe     Past Surgical History:   Past Surgical History:  Procedure Laterality Date   APPENDECTOMY     BREAST BIOPSY Right    CARPAL TUNNEL RELEASE Right 1992   cataract extration     COLONOSCOPY     CYST REMOVAL HAND     endocopy      TONSILLECTOMY  1952     Past OB/GYN History: G1P1001 Vaginal deliveries: 1,  Forceps/ Vacuum deliveries: 0, Cesarean section: 0 Menopausal: Yes Contraception: None. Last pap smear was 2011.  Any history of abnormal pap smears: no. HM  PAP   This patient has no relevant Health Maintenance data.     Medications: Patient has a current medication list which includes the following prescription(s): acetaminophen , aspirin  ec, atenolol , calcium -magnesium -zinc, clonidine , cyanocobalamin , ferrous sulfate, lancet device, lancets misc., metformin , minoxidil , olmesartan , rosuvastatin , sertraline , verapamil , and vibegron , and the following Facility-Administered Medications: sodium chloride .   Allergies: Patient is allergic to lipitor [atorvastatin].   Social History:  Social History   Tobacco Use   Smoking status: Never   Smokeless tobacco: Never  Vaping Use   Vaping status: Never Used  Substance Use Topics   Alcohol use: No   Drug use: No    Relationship status:  married Patient lives with spouse.   Patient is employed youth worker. Regular exercise: No History of abuse: No  Family History:   Family History  Problem Relation Age of Onset   Heart disease Mother    Diabetes Mother    Heart disease Father    Diabetes Father    Heart attack Father    Cerebral aneurysm Sister    Heart disease Brother    Heart attack Brother    Heart disease Brother        Stent   Colon cancer Neg Hx    Esophageal cancer Neg Hx    Stomach cancer Neg Hx    Rectal cancer Neg Hx    Breast cancer Neg Hx    Sleep apnea Neg Hx    Colon polyps Neg Hx    Crohn's disease Neg Hx    Ulcerative colitis Neg Hx      Review of Systems: Review of Systems  Constitutional:  Negative for chills and fever.  Respiratory:  Negative for cough and shortness of breath.   Cardiovascular:  Negative for chest pain and palpitations.  Gastrointestinal:  Negative for abdominal pain, blood in stool, constipation and diarrhea.  Skin:  Negative for rash.  Neurological:  Positive for weakness.  Endo/Heme/Allergies:  Bruises/bleeds easily.  Psychiatric/Behavioral:  Negative for depression and suicidal ideas. The patient is nervous/anxious.      OBJECTIVE Physical Exam: Vitals:   04/13/24 0959  BP: (!) 171/69  Pulse: 71  Weight: 145 lb 6.4 oz (66 kg)  Height: 5' 1.5 (1.562 m)    Physical Exam Vitals reviewed. Exam conducted with a chaperone present.  Constitutional:      Appearance: Normal appearance.  Pulmonary:     Effort: Pulmonary effort is normal.  Abdominal:     Palpations: Abdomen is soft.  Neurological:     General: No focal deficit present.     Mental Status: She is alert and oriented to person, place, and time.  Psychiatric:        Mood and Affect: Mood normal.        Behavior: Behavior normal. Behavior is cooperative.        Thought Content: Thought content normal.      GU / Detailed Urogynecologic Evaluation:  Pelvic Exam: Normal external female  genitalia; Bartholin's and Skene's glands normal in appearance; urethral meatus normal in appearance, no urethral masses or discharge.   CST: positive    Speculum exam reveals normal vaginal mucosa with atrophy. Cervix normal appearance. Uterus normal single, nontender. Adnexa normal adnexa.    With apex supported, anterior compartment defect was reduced  Pelvic floor strength II/V, puborectalis   Pelvic floor musculature: Right levator non-tender, Right obturator non-tender, Left levator non-tender, Left obturator non-tender  POP-Q:   POP-Q  -2  Aa   -2                                           Ba  -6                                              C   3                                            Gh  4                                            Pb  10                                            tvl   -2                                            Ap  -2                                            Bp  -7                                              D      Rectal Exam:  Normal external exam  Post-Void Residual (PVR) by Bladder Scan: In order to evaluate bladder emptying, we discussed obtaining a postvoid residual and patient agreed to this procedure.  Procedure: The ultrasound unit was placed on the patient's abdomen in the suprapubic region after the patient had voided.      Laboratory Results: Lab Results  Component Value Date   COLORU yellow 04/13/2024   CLARITYU clear 04/13/2024   GLUCOSEUR negative 04/13/2024   BILIRUBINUR negative 04/13/2024   SPECGRAV 1.010 04/13/2024   RBCUR negative 04/13/2024   PHUR 6.5 04/13/2024   PROTEINUR NEGATIVE 06/05/2023   UROBILINOGEN 0.2 04/13/2024   LEUKOCYTESUR Negative 04/13/2024    Lab Results  Component Value Date   CREATININE 0.75 12/31/2023   CREATININE 1.35 (H) 10/27/2023   CREATININE 0.93 08/29/2023    Lab Results  Component Value Date   HGBA1C 6.4 12/31/2023     Lab Results  Component Value Date   HGB 12.5 03/23/2024     ASSESSMENT AND PLAN Ms. Zelaya is a 78 y.o. with:  1. Urinary frequency   2. OAB (overactive bladder)   3. SUI (stress urinary incontinence, female)    We discussed the symptoms of overactive bladder (OAB), which include urinary urgency, urinary frequency, nocturia, with or  without urge incontinence.  While we do not know the exact etiology of OAB, several treatment options exist. We discussed management including behavioral therapy (decreasing bladder irritants, urge suppression strategies, timed voids, bladder retraining), physical therapy, medication; for refractory cases posterior tibial nerve stimulation, sacral neuromodulation, and intravesical botulinum toxin injection. For anticholinergic medications, we discussed the potential side effects of anticholinergics including dry eyes, dry mouth, constipation, cognitive impairment and urinary retention. For Beta-3 agonist medication, we discussed the potential side effect of elevated blood pressure which is more likely to occur in individuals with uncontrolled hypertension. Will start patient on Gemtesa  as she has already failed Myrbetriq  and Oxybutynin .     Anden Bartolo G Kenyon Eichelberger, NP

## 2024-04-14 ENCOUNTER — Other Ambulatory Visit (HOSPITAL_COMMUNITY): Payer: Self-pay

## 2024-04-14 ENCOUNTER — Other Ambulatory Visit: Payer: Self-pay

## 2024-04-15 ENCOUNTER — Encounter: Payer: Self-pay | Admitting: Obstetrics and Gynecology

## 2024-04-15 ENCOUNTER — Other Ambulatory Visit (HOSPITAL_COMMUNITY): Payer: Self-pay

## 2024-04-16 ENCOUNTER — Other Ambulatory Visit: Payer: Self-pay

## 2024-04-16 ENCOUNTER — Other Ambulatory Visit (HOSPITAL_COMMUNITY): Payer: Self-pay

## 2024-04-19 ENCOUNTER — Ambulatory Visit: Payer: Self-pay

## 2024-04-19 NOTE — Telephone Encounter (Signed)
 FYI Only or Action Required?: Action required by provider: request for appointment.  Patient was last seen in primary care on 12/31/2023 by Lendia Boby CROME, NP-C.  Called Nurse Triage reporting Hypertension.  Symptoms began a week ago.  Interventions attempted: Nothing.  Symptoms are: unchanged.BP goes up and down. Highest at 190/100. Feels weak, shaky, anxious.  Triage Disposition: See Physician Within 24 Hours  Patient/caregiver understands and will follow disposition?: Yes    Copied from CRM #[id number]. Topic: Clinical - Red Word Triage >> Apr 19, 2024  2:12 PM Montie POUR wrote: Red Word that prompted transfer to Nurse Triage:  Her blood pressure is going up and down - highest was 190/100 yesterday. Her husband is going to a lot of appointments for cancer. She is very anxiety.    Reason for Disposition  Systolic BP >= 180 OR Diastolic >= 110  Answer Assessment - Initial Assessment Questions 1. BLOOD PRESSURE: What is your blood pressure? Did you take at least two measurements 5 minutes apart?     190/100 2. ONSET: When did you take your blood pressure?     yesterday 3. HOW: How did you take your blood pressure? (e.g., automatic home BP monitor, visiting nurse)     Home cuff 4. HISTORY: Do you have a history of high blood pressure?     yes 5. MEDICINES: Are you taking any medicines for blood pressure? Have you missed any doses recently?     yes 6. OTHER SYMPTOMS: Do you have any symptoms? (e.g., blurred vision, chest pain, difficulty breathing, headache, weakness)     Anxiety,shaky 7. PREGNANCY: Is there any chance you are pregnant? When was your last menstrual period?     no  Protocols used: Blood Pressure - High-A-AH

## 2024-04-19 NOTE — Telephone Encounter (Signed)
 Any medication changes will be discussed at 11/18 appt

## 2024-04-20 ENCOUNTER — Encounter: Payer: Self-pay | Admitting: Emergency Medicine

## 2024-04-20 ENCOUNTER — Ambulatory Visit: Admitting: Emergency Medicine

## 2024-04-20 ENCOUNTER — Other Ambulatory Visit (HOSPITAL_COMMUNITY): Payer: Self-pay

## 2024-04-20 VITALS — BP 140/80 | HR 92 | Temp 98.1°F | Ht 61.5 in

## 2024-04-20 DIAGNOSIS — I10 Essential (primary) hypertension: Secondary | ICD-10-CM | POA: Diagnosis not present

## 2024-04-20 DIAGNOSIS — R42 Dizziness and giddiness: Secondary | ICD-10-CM

## 2024-04-20 DIAGNOSIS — F418 Other specified anxiety disorders: Secondary | ICD-10-CM | POA: Diagnosis not present

## 2024-04-20 MED ORDER — ALPRAZOLAM 0.25 MG PO TABS
0.2500 mg | ORAL_TABLET | Freq: Two times a day (BID) | ORAL | 0 refills | Status: DC | PRN
  Filled 2024-04-20: qty 20, 10d supply, fill #0

## 2024-04-20 NOTE — Patient Instructions (Signed)
 Dizziness Dizziness is a common problem. It makes you feel unsteady or light-headed. You may feel like you're about to faint. Dizziness can lead to getting hurt if you stumble or fall. It's more common to feel dizzy if you're an older adult. Many things can cause you to feel dizzy. These include: Medicines. Dehydration. This is when there's not enough water in your body. Illness. Follow these instructions at home: Eating and drinking  Drink enough fluid to keep your pee (urine) pale yellow. This helps keep you from getting dehydrated. Try to drink more clear fluids, such as water. Do not drink alcohol. Try to limit how much caffeine you take in. Try to limit how much salt, also called sodium, you take in. Activity Try not to make quick movements. Stand up slowly from sitting in a chair. Steady yourself until you feel okay. In the morning, first sit up on the side of the bed. When you feel okay, hold onto something and slowly stand up. Do this until you know that your balance is okay. If you need to stand in one place for a long time, move your legs often. Tighten and relax the muscles in your legs while you're standing. Do not drive or use machines if you feel dizzy. Avoid bending down if you feel dizzy. Place items in your home so you can reach them without leaning over. Lifestyle Do not smoke, vape, or use products with nicotine or tobacco in them. If you need help quitting, talk with your health care provider. Try to lower your stress level. You can do this by using methods like yoga or meditation. Talk with your provider if you need help. General instructions Watch your dizziness for any changes. Take your medicines only as told by your provider. Talk with your provider if you think you're dizzy because of a medicine you're taking. Tell a friend or a family member that you're feeling dizzy. If they spot any changes in your behavior, have them call your provider. Contact a health care  provider if: Your dizziness doesn't go away, or you have new symptoms. Your dizziness gets worse. You feel like you may vomit. You have trouble hearing. You have a fever. You have neck pain or a stiff neck. You fall or get hurt. Get help right away if: You vomit each time you eat or drink. You have watery poop and can't eat or drink. You have trouble talking, walking, swallowing, or using your arms, hands, or legs. You feel very weak. You're bleeding. You're not thinking clearly, or you have trouble forming sentences. A friend or family member may spot this. Your vision changes, or you get a very bad headache. These symptoms may be an emergency. Call 911 right away. Do not wait to see if the symptoms will go away. Do not drive yourself to the hospital. This information is not intended to replace advice given to you by your health care provider. Make sure you discuss any questions you have with your health care provider. Document Revised: 02/20/2023 Document Reviewed: 07/04/2022 Elsevier Patient Education  2024 [employer].

## 2024-04-20 NOTE — Assessment & Plan Note (Signed)
 Known triggers.  Affecting quality of life Mental health management discussed Recommend low-dose alprazolam  0.25 mg twice a day as needed Needs follow-up with PCP

## 2024-04-20 NOTE — Assessment & Plan Note (Signed)
 Clinically stable.  No red flag signs or symptoms No findings of stroke on physical exam Differential diagnosis discussed with patient Recommend blood work today.  Has history of chronic hyponatremia Blood pressure issues addressed Stress contributing a great deal. Recommend low-dose alprazolam  as needed ED precautions given Advised to follow-up with PCP this week or soon as possible

## 2024-04-20 NOTE — Progress Notes (Signed)
 Michele [name] 78 y.o.   Chief Complaint  Patient presents with   Dizziness    Pt states that her bp has been running high and she has some trembles in her hands and very off balance     HISTORY OF PRESENT ILLNESS: This is a 78 y.o. female complaining of feeling nervous and anxious almost daily for the past several weeks. Husband recently diagnosed with lung cancer needing multiple diagnostic procedures creating a lot of stress and anxiety on her Blood pressure bounces up and down.  Feels dizzy on and off.  Has shakes to her hands and sometimes feels off balance for the last couple of months No other associated symptoms No other complaints or medical concerns today.  Dizziness Pertinent negatives include no abdominal pain, chest pain, chills, congestion, coughing, fever, nausea, rash, sore throat or vomiting.     Prior to Admission medications   Medication Sig Start Date End Date Taking? Authorizing Provider  acetaminophen  (TYLENOL ) 500 MG tablet Take 500 mg by mouth 3 (three) times daily as needed for moderate pain.   Yes [provider]  ALPRAZolam  (XANAX ) 0.25 MG tablet Take 1 tablet (0.25 mg total) by mouth 2 (two) times daily as needed for anxiety. 04/20/24  Yes SagardiaEmil Schanz, MD  aspirin  EC 81 MG tablet Take 81 mg by mouth daily. Swallow whole.   Yes [provider]  atenolol  (TENORMIN ) 100 MG tablet TAKE 1 TABLET BY MOUTH IN THE MORNING FOR BLOOD PRESSURE 02/19/24  Yes Henson, Vickie L, NP-C  CALCIUM  MAGNESIUM  ZINC PO Take by mouth.   Yes [provider]  cloNIDine  (CATAPRES ) 0.2 MG tablet TAKE 1 TABLET BY MOUTH THREE TIMES DAILY AS NEEDED IF  BLOOD  PRESSURE  IS  OVER  160 02/19/24  Yes Henson, Vickie L, NP-C  Cyanocobalamin  (VITAMIN B-12 PO) Take 1 tablet by mouth daily. 5000 mc one time in the AM PRN   Yes [provider]  ferrous sulfate 325 (65 FE) MG tablet Take 325 mg by mouth daily with breakfast. Patient taking differently:  Take 325 mg by mouth daily with breakfast. One in the morning and one at night   Yes [provider]  Lancet Device MISC May substitute to any manufacturer covered by patient's insurance. 02/20/24  Yes Henson, Vickie L, NP-C  Lancets Misc. MISC Use to check blood sugars 2x daily. May substitute to any manufacturer covered by patient's insurance. 02/20/24  Yes Henson, Vickie L, NP-C  metFORMIN  (GLUCOPHAGE -XR) 500 MG 24 hr tablet TAKE 2 TABLETS BY MOUTH TWICE DAILY WITH MEALS FOR DIABETES 02/19/24  Yes Henson, Vickie L, NP-C  minoxidil  (LONITEN ) 10 MG tablet Take 1 tablet daily for BP 09/08/23  Yes Henson, Vickie L, NP-C  olmesartan  (BENICAR ) 40 MG tablet TAKE 1 TABLET BY MOUTH IN THE EVENING FOR BLOOD PRESSURE 12/08/23  Yes Henson, Vickie L, NP-C  rosuvastatin  (CRESTOR ) 20 MG tablet Take 1 tablet (20 mg total) by mouth every morning. 02/19/24  Yes Henson, Vickie L, NP-C  sertraline  (ZOLOFT ) 100 MG tablet TAKE 1 & 1/2 (ONE & ONE-HALF) TABLETS BY MOUTH ONCE DAILY 03/16/24  Yes Henson, Vickie L, NP-C  verapamil  (CALAN -SR) 240 MG CR tablet TAKE 1 TABLET BY MOUTH IN THE EVENING WITH FOOD 01/23/24  Yes Henson, Vickie L, NP-C  Vibegron  75 MG TABS Take 1 tablet (75 mg total) by mouth daily. 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   Anxiety 12/10/2018   OSA on CPAP 09/18/2018   Vitiligo 11/09/2017   Type 2 diabetes mellitus (HCC) 07/24/2017   Overweight (BMI 25.0-29.9) 07/24/2017   Cerebrovascular disease 01/02/2017   History of hyperthyroidism 09/11/2015   Vitamin D   deficiency 06/28/2013   CKD stage 2 due to type 2 diabetes mellitus (HCC)    Hyperlipidemia associated with type 2 diabetes mellitus (HCC) 03/21/2008   Essential hypertension 03/21/2008   GERD 03/21/2008   Fatty liver 03/21/2008   History of colonic polyps 03/21/2008    Past Medical History:  Diagnosis Date   Anemia    Anxiety    Arthritis    Carpal tunnel syndrome    Cataract    bilateral,removed   Cerebrovascular disease 01/02/2017   Duodenal diverticulum    Elevated uric acid in blood    Esophageal motility disorder    Fatty liver    GERD (gastroesophageal reflux disease)    Hyperlipidemia    Hypertension    Lymphocytic colitis 09/22/2012   Sleep apnea    Tubulovillous adenoma of colon    Type II or unspecified type diabetes mellitus without mention of complication, not stated as uncontrolled    Yeast detected    Rt big toe    Past Surgical History:  Procedure Laterality Date   APPENDECTOMY     BREAST BIOPSY Right    CARPAL TUNNEL RELEASE Right 1992   cataract extration     COLONOSCOPY     CYST REMOVAL HAND     endocopy      TONSILLECTOMY  1952    Social History   Socioeconomic History   Marital status: Married    Spouse name: Seena Kwolek   Number of children: 1   Years of education: 12   Highest education level: Not on file  Occupational History   Occupation: Retired    Associate Professor: [employer]  Tobacco Use   Smoking status: Never   Smokeless tobacco: Never  Vaping Use   Vaping status: Never Used  Substance and Sexual Activity   Alcohol use: No   Drug use: No   Sexual activity: Not Currently    Partners: Male    Comment: Married  Other Topics Concern   Not on file  Social History Narrative   Lives with husband.  Independent of ADLs. 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   Right-handed   Caffeine: about 1/2 cup of coffee per day, tea and Cokes a few times per week   Social Drivers of Health   Financial Resource  Strain: Low Risk  (10/24/2023)   Overall Financial Resource Strain (CARDIA)    Difficulty of Paying Living Expenses: Not hard at all  Food Insecurity: No Food Insecurity (12/22/2023)   Hunger Vital Sign    Worried About Running Out of Food in the Last Year: Never true    Ran Out of Food in the Last Year: Never true  Transportation Needs: No Transportation Needs (12/22/2023)   PRAPARE - Administrator, Civil Service (Medical): No    Lack of Transportation (Non-Medical): No  Physical Activity: Inactive (  10/24/2023)   Exercise Vital Sign    Days of Exercise per Week: 0 days    Minutes of Exercise per Session: 0 min  Stress: Stress Concern Present (10/24/2023)   [employer] of Occupational Health - Occupational Stress Questionnaire    Feeling of Stress : Very much  Social Connections: Socially Integrated (10/24/2023)   Social Connection and Isolation Panel    Frequency of Communication with Friends and Family: More than three times a week    Frequency of Social Gatherings with Friends and Family: More than three times a week    Attends Religious Services: More than 4 times per year    Active Member of [employer] or Organizations: Yes    Attends Banker Meetings: 1 to 4 times per year    Marital Status: Married  Catering Manager Violence: Not At Risk (12/22/2023)   Humiliation, Afraid, Rape, and Kick questionnaire    Fear of Current or Ex-Partner: No    Emotionally Abused: No    Physically Abused: No    Sexually Abused: No    Family History  Problem Relation Age of Onset   Heart disease Mother    Diabetes Mother    Heart disease Father    Diabetes Father    Heart attack Father    Cerebral aneurysm Sister    Heart disease Brother    Heart attack Brother    Heart disease Brother        Stent   Colon cancer Neg Hx    Esophageal cancer Neg Hx    Stomach cancer Neg Hx    Rectal cancer Neg Hx    Breast cancer Neg Hx    Sleep apnea Neg Hx    Colon polyps Neg  Hx    Crohn's disease Neg Hx    Ulcerative colitis Neg Hx      Review of Systems  Constitutional: Negative.  Negative for chills and fever.  HENT: Negative.  Negative for congestion and sore throat.   Respiratory: Negative.  Negative for cough and shortness of breath.   Cardiovascular: Negative.  Negative for chest pain and palpitations.  Gastrointestinal:  Negative for abdominal pain, diarrhea, nausea and vomiting.  Genitourinary: Negative.  Negative for dysuria and hematuria.  Skin: Negative.  Negative for rash.  Neurological:  Positive for dizziness.  Psychiatric/Behavioral:  The patient is nervous/anxious.   All other systems reviewed and are negative.   Vitals:   04/20/24 1604 04/20/24 1608  BP: (!) 140/80 (!) 140/80  Pulse: 92   Temp: 98.1 F (36.7 C)   SpO2: 95%     Physical Exam Vitals reviewed.  Constitutional:      Appearance: Normal appearance.  HENT:     Head: Normocephalic.     Mouth/Throat:     Mouth: Mucous membranes are moist.     Pharynx: Oropharynx is clear.  Eyes:     Extraocular Movements: Extraocular movements intact.     Conjunctiva/sclera: Conjunctivae normal.     Pupils: Pupils are equal, round, and reactive to light.  Cardiovascular:     Rate and Rhythm: Normal rate and regular rhythm.     Pulses: Normal pulses.     Heart sounds: Normal heart sounds.  Pulmonary:     Effort: Pulmonary effort is normal.     Breath sounds: Normal breath sounds.  Musculoskeletal:     Cervical back: No tenderness.  Lymphadenopathy:     Cervical: No cervical adenopathy.  Skin:    General: Skin  is warm and dry.     Capillary Refill: Capillary refill takes less than 2 seconds.  Neurological:     General: No focal deficit present.     Mental Status: She is alert and oriented to person, place, and time.     Cranial Nerves: No cranial nerve deficit.     Sensory: No sensory deficit.     Motor: No weakness.     Coordination: Coordination normal.     Gait: Gait  normal.  Psychiatric:        Mood and Affect: Mood normal.        Behavior: Behavior normal.      ASSESSMENT & PLAN: Problem List Items Addressed This Visit       Cardiovascular and Mediastinum   Essential hypertension   BP Readings from Last 3 Encounters:  04/20/24 (!) 140/80  04/13/24 (!) 171/69  01/27/24 (!) 144/82  Elevated blood pressure readings in the office Advised to continue monitoring blood pressure readings at home and use clonidine  as needed Continue atenolol  100 mg every morning along with verapamil  240 mg daily          Other   Situational anxiety   Known triggers.  Affecting quality of life Mental health management discussed Recommend low-dose alprazolam  0.25 mg twice a day as needed Needs follow-up with PCP      Relevant Medications   ALPRAZolam  (XANAX ) 0.25 MG tablet   Dizziness - Primary   Clinically stable.  No red flag signs or symptoms No findings of stroke on physical exam Differential diagnosis discussed with patient Recommend blood work today.  Has history of chronic hyponatremia Blood pressure issues addressed Stress contributing a great deal. Recommend low-dose alprazolam  as needed ED precautions given Advised to follow-up with PCP this week or soon as possible      Relevant Orders   Comprehensive metabolic panel with GFR   CBC with Differential/Platelet   Hemoglobin A1c   TSH   Patient Instructions  Dizziness Dizziness is a common problem. It makes you feel unsteady or light-headed. You may feel like you're about to faint. Dizziness can lead to getting hurt if you stumble or fall. It's more common to feel dizzy if you're an older adult. Many things can cause you to feel dizzy. These include: Medicines. Dehydration. This is when there's not enough water in your body. Illness. Follow these instructions at home: Eating and drinking  Drink enough fluid to keep your pee (urine) pale yellow. This helps keep you from getting  dehydrated. Try to drink more clear fluids, such as water. Do not drink alcohol. Try to limit how much caffeine you take in. Try to limit how much salt, also called sodium, you take in. Activity Try not to make quick movements. Stand up slowly from sitting in a chair. Steady yourself until you feel okay. In the morning, first sit up on the side of the bed. When you feel okay, hold onto something and slowly stand up. Do this until you know that your balance is okay. If you need to stand in one place for a long time, move your legs often. Tighten and relax the muscles in your legs while you're standing. Do not drive or use machines if you feel dizzy. Avoid bending down if you feel dizzy. Place items in your home so you can reach them without leaning over. Lifestyle Do not smoke, vape, or use products with nicotine or tobacco in them. If you need help quitting, talk with  your health care provider. Try to lower your stress level. You can do this by using methods like yoga or meditation. Talk with your provider if you need help. General instructions Watch your dizziness for any changes. Take your medicines only as told by your provider. Talk with your provider if you think you're dizzy because of a medicine you're taking. Tell a friend or a family member that you're feeling dizzy. If they spot any changes in your behavior, have them call your provider. Contact a health care provider if: Your dizziness doesn't go away, or you have new symptoms. Your dizziness gets worse. You feel like you may vomit. You have trouble hearing. You have a fever. You have neck pain or a stiff neck. You fall or get hurt. Get help right away if: You vomit each time you eat or drink. You have watery poop and can't eat or drink. You have trouble talking, walking, swallowing, or using your arms, hands, or legs. You feel very weak. You're bleeding. You're not thinking clearly, or you have trouble forming sentences.  A friend or family member may spot this. Your vision changes, or you get a very bad headache. These symptoms may be an emergency. Call 911 right away. Do not wait to see if the symptoms will go away. Do not drive yourself to the hospital. This information is not intended to replace advice given to you by your health care provider. Make sure you discuss any questions you have with your health care provider. Document Revised: 02/20/2023 Document Reviewed: 07/04/2022 Elsevier Patient Education  2024 Elsevier Inc.     Emil Schaumann, MD [facility]

## 2024-04-20 NOTE — Assessment & Plan Note (Signed)
 BP Readings from Last 3 Encounters:  04/20/24 (!) 140/80  04/13/24 (!) 171/69  01/27/24 (!) 144/82  Elevated blood pressure readings in the office Advised to continue monitoring blood pressure readings at home and use clonidine  as needed Continue atenolol  100 mg every morning along with verapamil  240 mg daily

## 2024-04-21 ENCOUNTER — Other Ambulatory Visit (HOSPITAL_COMMUNITY): Payer: Self-pay

## 2024-04-21 ENCOUNTER — Ambulatory Visit: Payer: Self-pay | Admitting: Emergency Medicine

## 2024-04-21 LAB — COMPREHENSIVE METABOLIC PANEL WITH GFR
ALT: 36 U/L — ABNORMAL HIGH (ref 0–35)
AST: 43 U/L — ABNORMAL HIGH (ref 0–37)
Albumin: 4.2 g/dL (ref 3.5–5.2)
Alkaline Phosphatase: 59 U/L (ref 39–117)
BUN: 14 mg/dL (ref 6–23)
CO2: 25 meq/L (ref 19–32)
Calcium: 9.3 mg/dL (ref 8.4–10.5)
Chloride: 103 meq/L (ref 96–112)
Creatinine, Ser: 0.84 mg/dL (ref 0.40–1.20)
GFR: 66.62 mL/min (ref >60.00)
Glucose, Bld: 154 mg/dL — ABNORMAL HIGH (ref 70–99)
Potassium: 4.1 meq/L (ref 3.5–5.1)
Sodium: 139 meq/L (ref 135–145)
Total Bilirubin: 0.8 mg/dL (ref 0.2–1.2)
Total Protein: 6.7 g/dL (ref 6.0–8.3)

## 2024-04-21 LAB — TSH: TSH: 0.74 u[IU]/mL (ref 0.35–5.50)

## 2024-04-21 LAB — CBC WITH DIFFERENTIAL/PLATELET
Basophils Absolute: 0 K/uL (ref 0.0–0.1)
Basophils Relative: 0.4 % (ref 0.0–3.0)
Eosinophils Absolute: 0.4 K/uL (ref 0.0–0.7)
Eosinophils Relative: 7.2 % — ABNORMAL HIGH (ref 0.0–5.0)
HCT: 36.4 % (ref 36.0–46.0)
Hemoglobin: 12.4 g/dL (ref 12.0–15.0)
Lymphocytes Relative: 31.2 % (ref 12.0–46.0)
Lymphs Abs: 1.8 K/uL (ref 0.7–4.0)
MCHC: 34 g/dL (ref 30.0–36.0)
MCV: 80.8 fl (ref 78.0–100.0)
Monocytes Absolute: 0.6 K/uL (ref 0.1–1.0)
Monocytes Relative: 9.5 % (ref 3.0–12.0)
Neutro Abs: 3 K/uL (ref 1.4–7.7)
Neutrophils Relative %: 51.7 % (ref 43.0–77.0)
Platelets: 166 K/uL (ref 150.0–400.0)
RBC: 4.51 Mil/uL (ref 3.87–5.11)
RDW: 15.8 % — ABNORMAL HIGH (ref 11.5–15.5)
WBC: 5.9 K/uL (ref 4.0–10.5)

## 2024-04-21 LAB — HEMOGLOBIN A1C: Hgb A1c MFr Bld: 6 % (ref 4.6–6.5)

## 2024-04-23 ENCOUNTER — Other Ambulatory Visit (HOSPITAL_COMMUNITY): Payer: Self-pay

## 2024-04-26 ENCOUNTER — Other Ambulatory Visit: Payer: Self-pay

## 2024-04-26 ENCOUNTER — Other Ambulatory Visit (HOSPITAL_COMMUNITY): Payer: Self-pay

## 2024-04-26 MED ORDER — GEMTESA 75 MG PO TABS
75.0000 mg | ORAL_TABLET | Freq: Every day | ORAL | 2 refills | Status: AC
  Filled 2024-05-11: qty 30, 30d supply, fill #0

## 2024-04-26 MED ORDER — ACCU-CHEK GUIDE W/DEVICE KIT
PACK | 11 refills | Status: AC
  Filled 2024-06-22: qty 1, 30d supply, fill #0

## 2024-04-26 MED ORDER — ACCU-CHEK SOFTCLIX LANCETS MISC
12 refills | Status: AC
  Filled 2024-06-22: qty 100, 50d supply, fill #0

## 2024-04-26 MED ORDER — OXYBUTYNIN CHLORIDE ER 10 MG PO TB24
10.0000 mg | ORAL_TABLET | Freq: Every day | ORAL | 1 refills | Status: DC
  Filled 2024-04-26: qty 90, 90d supply, fill #0
  Filled 2024-05-11: qty 30, 30d supply, fill #0

## 2024-04-26 MED ORDER — ACCU-CHEK GUIDE TEST VI STRP
ORAL_STRIP | 12 refills | Status: AC
  Filled 2024-06-22: qty 100, 50d supply, fill #0

## 2024-04-26 MED FILL — Sertraline HCl Tab 100 MG: ORAL | 30 days supply | Qty: 45 | Fill #0 | Status: AC

## 2024-04-28 ENCOUNTER — Other Ambulatory Visit (HOSPITAL_COMMUNITY): Payer: Self-pay

## 2024-04-30 ENCOUNTER — Other Ambulatory Visit (HOSPITAL_COMMUNITY): Payer: Self-pay

## 2024-05-01 ENCOUNTER — Other Ambulatory Visit: Payer: Self-pay

## 2024-05-01 ENCOUNTER — Ambulatory Visit
Admission: EM | Admit: 2024-05-01 | Discharge: 2024-05-01 | Disposition: A | Discharge status: Home / Self Care | Attending: Nurse Practitioner | Admitting: Nurse Practitioner

## 2024-05-01 DIAGNOSIS — J069 Acute upper respiratory infection, unspecified: Secondary | ICD-10-CM | POA: Diagnosis not present

## 2024-05-01 MED ORDER — PREDNISONE 20 MG PO TABS
40.0000 mg | ORAL_TABLET | Freq: Every day | ORAL | 0 refills | Status: AC
Start: 2024-05-01 — End: 2024-05-06

## 2024-05-01 MED ORDER — BENZONATATE 100 MG PO CAPS: 100.0000 mg | ORAL_CAPSULE | Freq: Three times a day (TID) | ORAL | 0 refills | Status: DC | PRN

## 2024-05-01 MED ORDER — FLUTICASONE PROPIONATE 50 MCG/ACT NA SUSP: 2.0000 | Freq: Every day | NASAL | 0 refills | Status: AC

## 2024-05-01 MED ORDER — AZITHROMYCIN 250 MG PO TABS: 250.0000 mg | ORAL_TABLET | Freq: Every day | ORAL | 0 refills | Status: DC

## 2024-05-01 NOTE — Discharge Instructions (Addendum)
 You were treated today for an upper respiratory infection that is causing your cough, congestion, sinus pressure, and sore throat. This is likely viral, but because your symptoms have lasted over 10 days and include thick mucus, a possible bacterial component cannot be ruled out. Take the Z-Pak exactly as prescribed until finished. Take prednisone  once daily for 5 days to help reduce airway and sinus inflammation. Use Flonase  nasal spray daily to help open your nasal passages and improve sinus drainage, and take Tessalon  Perles as needed for cough relief. Continue drinking plenty of fluids, use warm fluids or tea with honey to soothe your throat, consider saline nasal spray or rinses for congestion, and elevate your head at night to help with drainage and nighttime nasal blockage. A humidifier and CPAP equipment cleaning may also help with nighttime irritation.  Follow up with your primary care provider if symptoms do not begin to improve within several days, continue beyond completion of medications, or if your ear ringing does not resolve. Go to the emergency department immediately if you develop difficulty breathing, chest pain, persistent high fever, worsening headache or facial pain, confusion, vomiting, weakness, or any sudden or severe change in symptoms.

## 2024-05-01 NOTE — ED Triage Notes (Signed)
 Pt reports being seen in UC for cough, congestion, and ringing in R ear for approximately 10 days. Pt reports having chest congestion as well. Pt reports taking tylenol , mucinex, and nyquil with little to no relief.

## 2024-05-01 NOTE — ED Provider Notes (Signed)
 Michele [name]    CSN: [id number] Arrival date & time: 05/01/24  1216      History   Chief Complaint Chief Complaint  Patient presents with   Cough    HPI Michele [name] is a 78 y.o. female.   Discussed the use of AI scribe software for clinical note transcription with the patient, who gave verbal consent to proceed.   The patient presents with a 10-day history of cough and nasal congestion. She reports productive coughing with thick yellow sputum and thick yellow nasal discharge when blowing her nose. Nasal congestion is significantly worse at night, requiring the use of nasal spray to open her nasal passages when they become very blocked. She notes a sore throat primarily at night and reports using a CPAP machine during sleep. She has developed new ringing in the right ear only, though she denies any hearing loss. She describes sinus pressure contributing to headaches. She denies fever, shortness of breath, wheezing, vomiting, or diarrhea. She has tried Mucinex and NyQuil for symptom relief without significant improvement. She is unable to take Advil  or similar NSAIDs and takes aspirin  regularly. Her medical history is notable for diabetes mellitus with a most recent A1C of 6.0 and normal renal function per labs completed on 04/20/24.  The following sections of the patient's history were reviewed and updated as appropriate: allergies, current medications, past family history, past medical history, past social history, past surgical history, and problem list.       Past Medical History:  Diagnosis Date   Anemia    Anxiety    Arthritis    Carpal tunnel syndrome    Cataract    bilateral,removed   Cerebrovascular disease 01/02/2017   Duodenal diverticulum    Elevated uric acid in blood    Esophageal motility disorder    Fatty liver    GERD (gastroesophageal reflux disease)    Hyperlipidemia    Hypertension    Lymphocytic colitis 09/22/2012   Sleep apnea     Tubulovillous adenoma of colon    Type II or unspecified type diabetes mellitus without mention of complication, not stated as uncontrolled    Yeast detected    Rt big toe    Patient Active Problem List   Diagnosis Date Noted   Dizziness 04/20/2024   Urinary incontinence 08/31/2023   Overactive bladder 08/31/2023   Onychomycosis of toenail 08/31/2023   Hyponatremia 06/10/2022   HLD (hyperlipidemia) 06/10/2022   Lumbar spondylosis 01/01/2021   Osteopenia 06/08/2020   Depression, major, recurrent, 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   Situational anxiety 12/10/2018   OSA on CPAP 09/18/2018   Vitiligo 11/09/2017   Type 2 diabetes mellitus (HCC) 07/24/2017   Overweight (BMI 25.0-29.9) 07/24/2017   Cerebrovascular disease 01/02/2017   History of hyperthyroidism 09/11/2015   Vitamin D  deficiency 06/28/2013   CKD stage 2 due to type 2 diabetes mellitus (HCC)    Hyperlipidemia associated with type 2 diabetes mellitus (HCC) 03/21/2008   Essential hypertension 03/21/2008   GERD 03/21/2008   Fatty liver 03/21/2008   History of colonic polyps 03/21/2008    Past Surgical History:  Procedure Laterality Date   APPENDECTOMY     BREAST BIOPSY Right    CARPAL TUNNEL RELEASE Right 1992   cataract extration     COLONOSCOPY     CYST REMOVAL HAND     endocopy      TONSILLECTOMY  1952    OB History     Gravida  1  Para  1   Term  1   Preterm      AB      Living  1      SAB      IAB      Ectopic      Multiple      Live Births               Home Medications    Prior to Admission medications   Medication Sig Start Date End Date Taking? Authorizing Provider  azithromycin  (ZITHROMAX ) 250 MG tablet Take 1 tablet (250 mg total) by mouth daily. Take first 2 tablets together, then 1 every day until finished. 05/01/24  Yes Tremaine Earwood, FNP  benzonatate  (TESSALON ) 100 MG capsule Take 1 capsule (100 mg total) by mouth 3 (three) times daily as needed for cough.  05/01/24  Yes Nolin Grell, FNP  fluticasone (FLONASE) 50 MCG/ACT nasal spray Place 2 sprays into both nostrils daily. Shake well before use. Gently blow nose before spraying. Do not blow nose immediately after use. You should not taste the medication or feel it going down your throat; if you do, adjust your technique. 05/01/24  Yes Tippi Mccrae, FNP  predniSONE  (DELTASONE ) 20 MG tablet Take 2 tablets (40 mg total) by mouth daily for 5 days. 05/01/24 05/06/24 Yes Iola Lukes, FNP  acetaminophen  (TYLENOL ) 500 MG tablet Take 500 mg by mouth 3 (three) times daily as needed for moderate pain.    [provider]  ALPRAZolam  (XANAX ) 0.25 MG tablet Take 1 tablet (0.25 mg total) by mouth 2 (two) times daily as needed for anxiety. 04/20/24   Purcell Emil Schanz, MD  aspirin  EC 81 MG tablet Take 81 mg by mouth daily. Swallow whole.    [provider]  atenolol  (TENORMIN ) 100 MG tablet Take 1 tablet (100 mg total) by mouth every morning for blood pressure 02/19/24   Henson, Vickie L, NP-C  Blood Glucose Monitoring Suppl (ONETOUCH VERIO FLEX SYSTEM) w/Device KIT Use to test blood sugar twice daily 03/10/24   Henson, Vickie L, NP-C  CALCIUM  MAGNESIUM  ZINC PO Take by mouth.    [provider]  cloNIDine  (CATAPRES ) 0.2 MG tablet TAKE 1 TABLET BY MOUTH THREE TIMES DAILY AS NEEDED IF  BLOOD  PRESSURE  IS  OVER  160 02/19/24   Henson, Vickie L, NP-C  Cyanocobalamin  (VITAMIN B-12 PO) Take 1 tablet by mouth daily. 5000 mc one time in the AM PRN    [provider]  ferrous sulfate 325 (65 FE) MG tablet Take 325 mg by mouth daily with breakfast. Patient taking differently: Take 325 mg by mouth daily with breakfast. One in the morning and one at night    [provider]  glucose blood test strip Use to check blood glucose twice daily 02/19/24   Henson, Vickie L, NP-C  Lancet Device MISC May substitute to any manufacturer covered by patient's insurance. 02/20/24    Henson, Vickie L, NP-C  Lancets (ONETOUCH DELICA PLUS LANCET33G) MISC Use to check blood sugar twice daily 02/20/24   Henson, Vickie L, NP-C  Lancets Misc. MISC Use to check blood sugars 2x daily. May substitute to any manufacturer covered by patient's insurance. 02/20/24   Henson, Vickie L, NP-C  metFORMIN  (GLUCOPHAGE -XR) 500 MG 24 hr tablet TAKE 2 TABLETS BY MOUTH TWICE DAILY WITH MEALS FOR DIABETES 02/19/24   Lendia Nordmann L, NP-C  minoxidil  (LONITEN ) 10 MG tablet Take 1 tablet daily for BP 09/08/23   Henson,  Vickie L, NP-C  olmesartan  (BENICAR ) 40 MG tablet TAKE 1 TABLET BY MOUTH IN THE EVENING FOR BLOOD PRESSURE 12/08/23   Henson, Vickie L, NP-C  oxybutynin  (DITROPAN -XL) 10 MG 24 hr tablet Take 1 tablet (10 mg total) by mouth at bedtime. Patient not taking: Reported on 05/01/2024 02/20/24   Lendia Nordmann L, NP-C  rosuvastatin  (CRESTOR ) 20 MG tablet Take 1 tablet (20 mg total) by mouth every morning. 02/19/24   Henson, Vickie L, NP-C  sertraline  (ZOLOFT ) 100 MG tablet Take 1.5 tablets (150 mg total) by mouth daily. 03/16/24   Henson, Vickie L, NP-C  verapamil  (CALAN -SR) 240 MG CR tablet TAKE 1 TABLET BY MOUTH IN THE EVENING WITH FOOD 01/23/24   Henson, Vickie L, NP-C  Vibegron  (GEMTESA ) 75 MG TABS Take 1 tablet (75 mg total) by mouth daily. 12/31/23   Henson, Vickie L, NP-C  Vibegron  75 MG TABS Take 1 tablet (75 mg total) by mouth daily. 04/13/24   Zuleta, Kaitlin G, NP    Family History Family History  Problem Relation Age of Onset   Heart disease Mother    Diabetes Mother    Heart disease Father    Diabetes Father    Heart attack Father    Cerebral aneurysm Sister    Heart disease Brother    Heart attack Brother    Heart disease Brother        Stent   Colon cancer Neg Hx    Esophageal cancer Neg Hx    Stomach cancer Neg Hx    Rectal cancer Neg Hx    Breast cancer Neg Hx    Sleep apnea Neg Hx    Colon polyps Neg Hx    Crohn's disease Neg Hx    Ulcerative colitis Neg Hx     Social  History Social History   Tobacco Use   Smoking status: Never   Smokeless tobacco: Never  Vaping Use   Vaping status: Never Used  Substance Use Topics   Alcohol use: No   Drug use: No     Allergies   Lipitor [atorvastatin]   Review of Systems Review of Systems  Constitutional:  Negative for fever.  HENT:  Positive for congestion, rhinorrhea, sinus pressure, sore throat (some that occurs only at night) and tinnitus (right ear only). Negative for hearing loss.   Respiratory:  Positive for cough. Negative for shortness of breath and wheezing.   Gastrointestinal:  Negative for abdominal pain, diarrhea, nausea and vomiting.  Neurological:  Positive for headaches.  All other systems reviewed and are negative.    Physical Exam Triage Vital Signs ED Triage Vitals  Encounter Vitals Group     BP 05/01/24 1403 (!) 173/79     Girls Systolic BP Percentile --      Girls Diastolic BP Percentile --      Boys Systolic BP Percentile --      Boys Diastolic BP Percentile --      Pulse Rate 05/01/24 1403 64     Resp 05/01/24 1403 19     Temp 05/01/24 1403 97.9 F (36.6 C)     Temp Source 05/01/24 1403 Temporal     SpO2 05/01/24 1403 97 %     Weight --      Height --      Head Circumference --      Peak Flow --      Pain Score 05/01/24 1404 0     Pain Loc --  Pain Education --      Exclude from Growth Chart --    No data found.  Updated Vital Signs BP (!) 173/79 (BP Location: Right Arm)   Pulse 64   Temp 97.9 F (36.6 C) (Temporal)   Resp 19   SpO2 97%   Visual Acuity Right Eye Distance:   Left Eye Distance:   Bilateral Distance:    Right Eye Near:   Left Eye Near:    Bilateral Near:     Physical Exam Vitals reviewed.  Constitutional:      General: She is awake. She is not in acute distress.    Appearance: Normal appearance. She is well-developed. She is not ill-appearing, toxic-appearing or diaphoretic.  HENT:     Head: Normocephalic.     Right Ear:  Hearing, tympanic membrane, ear canal and external ear normal. No drainage, swelling or tenderness. No middle ear effusion. Tympanic membrane is not erythematous.     Left Ear: Hearing, tympanic membrane, ear canal and external ear normal. No drainage, swelling or tenderness.  No middle ear effusion. Tympanic membrane is not erythematous.     Nose: Congestion present. No rhinorrhea.     Right Sinus: No maxillary sinus tenderness or frontal sinus tenderness.     Left Sinus: No maxillary sinus tenderness or frontal sinus tenderness.     Mouth/Throat:     Lips: Pink.     Mouth: Mucous membranes are moist.     Pharynx: Oropharynx is clear. Uvula midline. No pharyngeal swelling, oropharyngeal exudate, posterior oropharyngeal erythema or uvula swelling.     Tonsils: No tonsillar exudate or tonsillar abscesses.  Eyes:     General: Vision grossly intact.     Conjunctiva/sclera: Conjunctivae normal.  Cardiovascular:     Rate and Rhythm: Normal rate and regular rhythm.     Heart sounds: Normal heart sounds.  Pulmonary:     Effort: Pulmonary effort is normal.     Breath sounds: Normal breath sounds and air entry.  Musculoskeletal:        General: Normal range of motion.     Cervical back: Full passive range of motion without pain, normal range of motion and neck supple.  Lymphadenopathy:     Cervical: No cervical adenopathy.  Skin:    General: Skin is warm and dry.  Neurological:     General: No focal deficit present.     Mental Status: She is alert and oriented to person, place, and time.  Psychiatric:        Mood and Affect: Mood normal.        Behavior: Behavior normal. Behavior is cooperative.      UC Treatments / Results  Labs (all labs ordered are listed, but only abnormal results are displayed) Labs Reviewed - No data to display  EKG   Radiology No results found.  Procedures Procedures (including critical care time)  Medications Ordered in UC Medications - No data to  display  Initial Impression / Assessment and Plan / UC Course  I have reviewed the triage vital signs and the nursing notes.  Pertinent labs & imaging results that were available during my care of the patient were reviewed by me and considered in my medical decision making (see chart for details).     The patient presents with a 10-day history of cough and nasal congestion with thick yellow sputum production, yellow nasal discharge, sinus pressure, nighttime nasal obstruction, mild sore throat, and new right-sided tinnitus without hearing loss. She  denies fever, shortness of breath, wheezing, vomiting, or diarrhea. On exam she is alert, oriented, afebrile, and nontoxic. The overall clinical picture is most consistent with an upper respiratory tract infection, likely viral in origin, though a secondary bacterial component cannot be ruled out given the duration and purulent symptoms. Treatment was initiated with azithromycin  as a Z-Pak, a five-day course of prednisone  for airway and sinus inflammation, daily Flonase for congestion and sinus drainage, and Tessalon  Perles as needed for cough suppression. She was advised to follow up with her primary care provider if symptoms fail to improve over several days or worsen, and to seek emergency evaluation for new or worsening shortness of breath, persistent or high fever, chest pain, confusion, severe headache, or any other sudden concerning symptoms.  Today's evaluation has revealed no signs of a dangerous process. Discussed diagnosis with patient and/or guardian. Patient and/or guardian aware of their diagnosis, possible red flag symptoms to watch out for and need for close follow up. Patient and/or guardian understands verbal and written discharge instructions. Patient and/or guardian comfortable with plan and disposition.  Patient and/or guardian has a clear mental status at this time, good insight into illness (after discussion and teaching) and has clear  judgment to make decisions regarding their care  Documentation was completed with the aid of voice recognition software. Transcription may contain typographical errors.  Final Clinical Impressions(s) / UC Diagnoses   Final diagnoses:  Upper respiratory tract infection, unspecified type     Discharge Instructions      You were treated today for an upper respiratory infection that is causing your cough, congestion, sinus pressure, and sore throat. This is likely viral, but because your symptoms have lasted over 10 days and include thick mucus, a possible bacterial component cannot be ruled out. Take the Z-Pak exactly as prescribed until finished. Take prednisone  once daily for 5 days to help reduce airway and sinus inflammation. Use Flonase nasal spray daily to help open your nasal passages and improve sinus drainage, and take Tessalon  Perles as needed for cough relief. Continue drinking plenty of fluids, use warm fluids or tea with honey to soothe your throat, consider saline nasal spray or rinses for congestion, and elevate your head at night to help with drainage and nighttime nasal blockage. A humidifier and CPAP equipment cleaning may also help with nighttime irritation.  Follow up with your primary care provider if symptoms do not begin to improve within several days, continue beyond completion of medications, or if your ear ringing does not resolve. Go to the emergency department immediately if you develop difficulty breathing, chest pain, persistent high fever, worsening headache or facial pain, confusion, vomiting, weakness, or any sudden or severe change in symptoms.     ED Prescriptions     Medication Sig Dispense Auth. Provider   azithromycin  (ZITHROMAX ) 250 MG tablet Take 1 tablet (250 mg total) by mouth daily. Take first 2 tablets together, then 1 every day until finished. 6 tablet Iola Lukes, FNP   benzonatate  (TESSALON ) 100 MG capsule Take 1 capsule (100 mg total) by  mouth 3 (three) times daily as needed for cough. 30 capsule Piers Baade, FNP   fluticasone (FLONASE) 50 MCG/ACT nasal spray Place 2 sprays into both nostrils daily. Shake well before use. Gently blow nose before spraying. Do not blow nose immediately after use. You should not taste the medication or feel it going down your throat; if you do, adjust your technique. 16 g Iola Lukes, FNP   predniSONE  (  DELTASONE ) 20 MG tablet Take 2 tablets (40 mg total) by mouth daily for 5 days. 10 tablet Iola Lukes, FNP      PDMP not reviewed this encounter.   Iola Lukes, [place] 05/01/24 [phone]

## 2024-05-03 ENCOUNTER — Ambulatory Visit (INDEPENDENT_AMBULATORY_CARE_PROVIDER_SITE_OTHER): Admitting: Podiatry

## 2024-05-03 ENCOUNTER — Encounter: Payer: Self-pay | Admitting: Podiatry

## 2024-05-03 ENCOUNTER — Other Ambulatory Visit (HOSPITAL_COMMUNITY): Payer: Self-pay

## 2024-05-03 DIAGNOSIS — B351 Tinea unguium: Secondary | ICD-10-CM

## 2024-05-03 NOTE — Progress Notes (Signed)
 Subjective:  Patient ID: Michele [name], female    DOB: [birth year],  MRN: [id number]  Michele [name] presents to clinic today for:  Chief Complaint  Patient presents with   Nail Problem    3 month fungal nail recheck oral itraconazole , RFC. NIDDM A1C 6.0   Patient is here for 78-month fungal nail recheck.  She had completed a 78-month course of oral itraconazole .  Had no side effects.  The nail on the right great toe continues to grow out clearly.  She is requesting all the nails be trimmed however the length actually is minimal today.  PCP is Henson, Vickie L, NP-C.  Past Medical History:  Diagnosis Date   Anemia    Anxiety    Arthritis    Carpal tunnel syndrome    Cataract    bilateral,removed   Cerebrovascular disease 01/02/2017   Duodenal diverticulum    Elevated uric acid in blood    Esophageal motility disorder    Fatty liver    GERD (gastroesophageal reflux disease)    Hyperlipidemia    Hypertension    Lymphocytic colitis 09/22/2012   Sleep apnea    Tubulovillous adenoma of colon    Type II or unspecified type diabetes mellitus without mention of complication, not stated as uncontrolled    Yeast detected    Rt big toe   Past Surgical History:  Procedure Laterality Date   APPENDECTOMY     BREAST BIOPSY Right    CARPAL TUNNEL RELEASE Right 1992   cataract extration     COLONOSCOPY     CYST REMOVAL HAND     endocopy      TONSILLECTOMY  1952   Allergies  Allergen Reactions   Lipitor [Atorvastatin] Other (See Comments)    Elevates LFT's    Review of Systems: Negative except as noted in the HPI.  Objective:  Michele [name] is a pleasant 78 y.o. female in NAD. AAO x 3.  Vascular Examination: Capillary refill time is 3-5 seconds to toes bilateral. Palpable pedal pulses b/l LE. Digital hair present b/l.  Skin temperature gradient WNL b/l. No varicosities b/l. No cyanosis noted b/l.   Dermatological Examination: Pedal skin with normal turgor, texture  and tone b/l. No open wounds. No interdigital macerations b/l.  The right hallux toenail has approximately 90% resolution of the fungal involvement.  The proximal portion was clear of any fungal involvement grossly.  The remainder of the nails are within normal limits and are neatly trimmed.     Latest Ref Rng & Units 04/20/2024    4:33 PM 12/31/2023   11:28 AM 08/29/2023   11:49 AM  Hemoglobin A1C  Hemoglobin-A1c 4.6 - 6.5 % 6.0  6.4  6.6    Assessment/Plan: 1. Dermatophytosis of nail    The mycotic right hallux toenail was sharply debrided with sterile nail nippers and a power debriding burr to decrease bulk/thickness and length.  The remainder of the nails were burred smooth but no length needed to be trimmed today.  Return in about 3 months (around 08/01/2024) for fungal nail recheck / [facility].   Awanda CHARM [place], DPM, FACFAS [facility]     [address], [place] [address]  Office [phone]  Fax [phone]

## 2024-05-04 ENCOUNTER — Other Ambulatory Visit (HOSPITAL_COMMUNITY): Payer: Self-pay

## 2024-05-04 ENCOUNTER — Other Ambulatory Visit: Payer: Self-pay

## 2024-05-04 ENCOUNTER — Other Ambulatory Visit: Payer: Self-pay | Admitting: Family Medicine

## 2024-05-04 DIAGNOSIS — E1122 Type 2 diabetes mellitus with diabetic chronic kidney disease: Secondary | ICD-10-CM

## 2024-05-04 DIAGNOSIS — I10 Essential (primary) hypertension: Secondary | ICD-10-CM

## 2024-05-04 DIAGNOSIS — E782 Mixed hyperlipidemia: Secondary | ICD-10-CM

## 2024-05-04 MED ORDER — ATENOLOL 100 MG PO TABS
100.0000 mg | ORAL_TABLET | Freq: Every morning | ORAL | 1 refills | Status: AC
  Filled 2024-05-04 – 2024-05-20 (×3): qty 90, 90d supply, fill #0

## 2024-05-04 MED ORDER — VERAPAMIL HCL ER 240 MG PO TBCR
240.0000 mg | EXTENDED_RELEASE_TABLET | ORAL | 1 refills | Status: AC
  Filled 2024-05-04: qty 90, 90d supply, fill #0
  Filled 2024-05-11: qty 30, 30d supply, fill #0
  Filled 2024-05-20 – 2024-06-21 (×4): qty 30, 30d supply, fill #1

## 2024-05-04 MED ORDER — OLMESARTAN MEDOXOMIL 40 MG PO TABS
40.0000 mg | ORAL_TABLET | ORAL | 1 refills | Status: AC
  Filled 2024-05-04: qty 90, 90d supply, fill #0
  Filled 2024-05-11: qty 30, 30d supply, fill #0
  Filled 2024-05-20 – 2024-06-21 (×2): qty 30, 30d supply, fill #1

## 2024-05-04 MED ORDER — METFORMIN HCL ER 500 MG PO TB24
1000.0000 mg | ORAL_TABLET | ORAL | 1 refills | Status: AC
  Filled 2024-05-04: qty 360, 90d supply, fill #0
  Filled 2024-05-11: qty 120, 30d supply, fill #0
  Filled 2024-05-20 – 2024-06-15 (×4): qty 120, 30d supply, fill #1
  Filled 2024-07-09: qty 120, 30d supply, fill #2

## 2024-05-04 MED ORDER — ROSUVASTATIN CALCIUM 20 MG PO TABS
20.0000 mg | ORAL_TABLET | Freq: Every morning | ORAL | 1 refills | Status: AC
  Filled 2024-05-04 – 2024-05-20 (×3): qty 90, 90d supply, fill #0

## 2024-05-05 ENCOUNTER — Other Ambulatory Visit: Payer: Self-pay

## 2024-05-07 ENCOUNTER — Other Ambulatory Visit: Payer: Self-pay

## 2024-05-08 ENCOUNTER — Other Ambulatory Visit (HOSPITAL_COMMUNITY): Payer: Self-pay

## 2024-05-09 ENCOUNTER — Other Ambulatory Visit (HOSPITAL_COMMUNITY): Payer: Self-pay

## 2024-05-10 ENCOUNTER — Other Ambulatory Visit: Payer: Self-pay

## 2024-05-10 ENCOUNTER — Other Ambulatory Visit (HOSPITAL_COMMUNITY): Payer: Self-pay

## 2024-05-11 ENCOUNTER — Other Ambulatory Visit (HOSPITAL_COMMUNITY): Payer: Self-pay

## 2024-05-11 ENCOUNTER — Other Ambulatory Visit: Payer: Self-pay

## 2024-05-11 MED FILL — Sertraline HCl Tab 100 MG: ORAL | 30 days supply | Qty: 45 | Fill #1 | Status: CN

## 2024-05-12 ENCOUNTER — Other Ambulatory Visit: Payer: Self-pay

## 2024-05-12 ENCOUNTER — Encounter: Payer: Self-pay | Admitting: Family Medicine

## 2024-05-12 ENCOUNTER — Other Ambulatory Visit (HOSPITAL_COMMUNITY): Payer: Self-pay

## 2024-05-12 ENCOUNTER — Ambulatory Visit: Admitting: Family Medicine

## 2024-05-12 VITALS — BP 138/68 | HR 62 | Temp 98.6°F | Ht 61.5 in | Wt 139.8 lb

## 2024-05-12 DIAGNOSIS — F418 Other specified anxiety disorders: Secondary | ICD-10-CM | POA: Diagnosis not present

## 2024-05-12 DIAGNOSIS — D509 Iron deficiency anemia, unspecified: Secondary | ICD-10-CM | POA: Diagnosis not present

## 2024-05-12 DIAGNOSIS — E559 Vitamin D deficiency, unspecified: Secondary | ICD-10-CM

## 2024-05-12 DIAGNOSIS — K760 Fatty (change of) liver, not elsewhere classified: Secondary | ICD-10-CM

## 2024-05-12 DIAGNOSIS — N3281 Overactive bladder: Secondary | ICD-10-CM

## 2024-05-12 DIAGNOSIS — E1122 Type 2 diabetes mellitus with diabetic chronic kidney disease: Secondary | ICD-10-CM

## 2024-05-12 DIAGNOSIS — I10 Essential (primary) hypertension: Secondary | ICD-10-CM

## 2024-05-12 DIAGNOSIS — G4733 Obstructive sleep apnea (adult) (pediatric): Secondary | ICD-10-CM

## 2024-05-12 NOTE — Progress Notes (Signed)
 Subjective:     Patient ID: Michele [name], female    DOB: 1946/02/27, 78 y.o.   MRN: [id number]  Chief Complaint  Patient presents with   Follow-up    20m f/u Taking Gemtesa  samples from urologist     HPI  Discussed the use of AI scribe software for clinical note transcription with the patient, who gave verbal consent to proceed.  History of Present Illness Michele [name] is a 78 year old female with hypertension and anxiety who presents for follow-up on chronic health conditions.  Anxiety and psychological distress - Worsening anxiety since last visit, primarily related to husband's stage III cancer and caregiving responsibilities - Feels overwhelmed - Did not tolerate alprazolam  due to side effects - Currently takes sertraline  in the late afternoon, which helps with sleep initiation  Sleep disturbance - Difficulty maintaining sleep; able to fall asleep but wakes to urinate and cannot return to sleep - Sertraline  taken in the late afternoon assists with sleep onset - Uses CPAP for sleep apnea  Urinary symptoms - Nocturia causing sleep disruption - Currently using Gemtesa  samples for urinary symptoms - Finds Gemtesa  expensive and is comparing its benefit to Myrbetriq  - Follows with urogyn  Hypertension - Chronic hypertension, no acute symptoms reported at this visit  Dizziness and cerebrovascular disease - Upcoming neurology evaluation for dizziness and cerebrovascular disease  Iron  deficiency - Restarted one iron  pill daily for low iron  - Prior gastrointestinal workup and imaging did not reveal a source of bleeding or other significant findings  Prediabetes and dietary habits - Recent A1c of 6.0% in the prediabetes range - Acknowledges increased junk food intake and not following a healthy diet  Rheumatologic evaluation - Rheumatology visit planned      EXAM: MRI ABDOMEN WITHOUT AND WITH CONTRAST (INCLUDING MRCP)   TECHNIQUE: Multiplanar multisequence  MR imaging of the abdomen was performed both before and after the administration of intravenous contrast. Heavily T2-weighted images of the biliary and pancreatic ducts were obtained, and three-dimensional MRCP images were rendered by post processing.   CONTRAST:  7mL GADAVIST  GADOBUTROL  1 MMOL/ML IV SOLN   COMPARISON:  Right upper quadrant ultrasound, 01/12/2024   FINDINGS: Lower chest: No acute abnormality.  Mild cardiomegaly.   Hepatobiliary: No solid liver abnormality is seen. No gallstones, gallbladder wall thickening, or biliary dilatation. Common bile duct is nondilated on today's examination, measuring up to 0.5 cm in caliber (series 3, image 14). Mild periportal edema.   Pancreas: Unremarkable. No pancreatic ductal dilatation or surrounding inflammatory changes.   Spleen: Mild splenomegaly, maximum coronal span 13.4 cm.   Adrenals/Urinary Tract: Adrenal glands are unremarkable. Simple fluid signal and hemorrhagic or proteinaceous renal cortical cysts, benign, requiring no further follow-up or characterization. Kidneys are otherwise normal, without obvious renal calculi, solid lesion, or hydronephrosis.   Stomach/Bowel: Stomach is within normal limits. No evidence of bowel wall thickening, distention, or inflammatory changes.   Vascular/Lymphatic: No significant vascular findings are present. No enlarged abdominal lymph nodes.   Other: No abdominal wall hernia or abnormality. No ascites.   Musculoskeletal: No acute or significant osseous findings.   IMPRESSION: 1. Common bile duct is nondilated on today's examination, measuring up to 0.5 cm in caliber. No gallstones, gallbladder wall thickening, or biliary dilatation. 2. Mild periportal edema, nonspecific and may be related to nonspecific hepatitis/cholangitis or volume status. 3. Mild splenomegaly. 4. Mild cardiomegaly.     Electronically Signed   By: Marolyn JONETTA Jaksch M.D.   On: 01/31/2024 07:03  Health  Maintenance Due  Topic Date Due   FOOT EXAM  04/23/2024    Past Medical History:  Diagnosis Date   Anemia    Anxiety    Arthritis    Carpal tunnel syndrome    Cataract    bilateral,removed   Cerebrovascular disease 01/02/2017   Duodenal diverticulum    Elevated uric acid in blood    Esophageal motility disorder    Fatty liver    GERD (gastroesophageal reflux disease)    Hyperlipidemia    Hypertension    Lymphocytic colitis 09/22/2012   Sleep apnea    Tubulovillous adenoma of colon    Type II or unspecified type diabetes mellitus without mention of complication, not stated as uncontrolled    Yeast detected    Rt big toe    Past Surgical History:  Procedure Laterality Date   APPENDECTOMY     BREAST BIOPSY Right    CARPAL TUNNEL RELEASE Right 1992   cataract extration     COLONOSCOPY     CYST REMOVAL HAND     endocopy      TONSILLECTOMY  1952    Family History  Problem Relation Age of Onset   Heart disease Mother    Diabetes Mother    Heart disease Father    Diabetes Father    Heart attack Father    Cerebral aneurysm Sister    Heart disease Brother    Heart attack Brother    Heart disease Brother        Stent   Colon cancer Neg Hx    Esophageal cancer Neg Hx    Stomach cancer Neg Hx    Rectal cancer Neg Hx    Breast cancer Neg Hx    Sleep apnea Neg Hx    Colon polyps Neg Hx    Crohn's disease Neg Hx    Ulcerative colitis Neg Hx     Social History   Socioeconomic History   Marital status: Married    Spouse name: Seena Dettore   Number of children: 1   Years of education: 12   Highest education level: Not on file  Occupational History   Occupation: Retired    Associate Professor: [employer]  Tobacco Use   Smoking status: Never   Smokeless tobacco: Never  Vaping Use   Vaping status: Never Used  Substance and Sexual Activity   Alcohol use: No   Drug use: No   Sexual activity: Not Currently    Partners: Male    Comment: Married  Other Topics Concern    Not on file  Social History Narrative   Lives with husband.  Independent of ADLs. 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   Right-handed   Caffeine: about 1/2 cup of coffee per day, tea and Cokes a few times per week   Social Drivers of Health   Financial Resource Strain: Low Risk  (10/24/2023)   Overall Financial Resource Strain (CARDIA)    Difficulty of Paying Living Expenses: Not hard at all  Food Insecurity: No Food Insecurity (12/22/2023)   Hunger Vital Sign    Worried About Running Out of Food in the Last Year: Never true    Ran Out of Food in the Last Year: Never true  Transportation Needs: No Transportation Needs (12/22/2023)   PRAPARE - Administrator, Civil Service (Medical): No    Lack of Transportation (Non-Medical): No  Physical Activity: Inactive (10/24/2023)   Exercise Vital Sign  Days of Exercise per Week: 0 days    Minutes of Exercise per Session: 0 min  Stress: Stress Concern Present (10/24/2023)   [employer] of Occupational Health - Occupational Stress Questionnaire    Feeling of Stress : Very much  Social Connections: Socially Integrated (10/24/2023)   Social Connection and Isolation Panel    Frequency of Communication with Friends and Family: More than three times a week    Frequency of Social Gatherings with Friends and Family: More than three times a week    Attends Religious Services: More than 4 times per year    Active Member of [employer] or Organizations: Yes    Attends Banker Meetings: 1 to 4 times per year    Marital Status: Married  Catering Manager Violence: Not At Risk (12/22/2023)   Humiliation, Afraid, Rape, and Kick questionnaire    Fear of Current or Ex-Partner: No    Emotionally Abused: No    Physically Abused: No    Sexually Abused: No    Outpatient Medications Prior to Visit  Medication Sig Dispense Refill   acetaminophen  (TYLENOL ) 500 MG tablet Take 500 mg by mouth 3 (three) times  daily as needed for moderate pain.     aspirin  EC 81 MG tablet Take 81 mg by mouth daily. Swallow whole.     atenolol  (TENORMIN ) 100 MG tablet Take 1 tablet (100 mg total) by mouth every morning for blood pressure 90 tablet 1   Blood Glucose Monitoring Suppl (ONETOUCH VERIO FLEX SYSTEM) w/Device KIT Use to test blood sugar twice daily 1 kit 11   CALCIUM  MAGNESIUM  ZINC PO Take by mouth.     cloNIDine  (CATAPRES ) 0.2 MG tablet TAKE 1 TABLET BY MOUTH THREE TIMES DAILY AS NEEDED IF  BLOOD  PRESSURE  IS  OVER  160 270 tablet 0   Cyanocobalamin  (VITAMIN B-12 PO) Take 1 tablet by mouth daily. 5000 mc one time in the AM PRN     ferrous sulfate 325 (65 FE) MG tablet Take 325 mg by mouth daily with breakfast.     fluticasone  (FLONASE ) 50 MCG/ACT nasal spray Place 2 sprays into both nostrils daily. Shake well before use. Gently blow nose before spraying. Do not blow nose immediately after use. You should not taste the medication or feel it going down your throat; if you do, adjust your technique. 16 g 0   glucose blood test strip Use to check blood glucose twice daily 100 each 12   Lancet Device MISC May substitute to any manufacturer covered by patient's insurance. 1 each 0   Lancets (ONETOUCH DELICA PLUS LANCET33G) MISC Use to check blood sugar twice daily 100 each 12   Lancets Misc. MISC Use to check blood sugars 2x daily. May substitute to any manufacturer covered by patient's insurance. 100 each 12   metFORMIN  (GLUCOPHAGE -XR) 500 MG 24 hr tablet Take 2 tablets (1,000 mg total) by mouth 2 (two) times daily with a meal for Diabetes 360 tablet 1   minoxidil  (LONITEN ) 10 MG tablet Take 1 tablet daily for BP 90 tablet 3   olmesartan  (BENICAR ) 40 MG tablet Take 1 tablet (40 mg total) by mouth every evening for Blood pressure 90 tablet 1   rosuvastatin  (CRESTOR ) 20 MG tablet Take 1 tablet (20 mg total) by mouth every morning. 90 tablet 1   sertraline  (ZOLOFT ) 100 MG tablet Take 1.5 tablets (150 mg total) by  mouth daily. 45 tablet 1   verapamil  (CALAN -SR) 240 MG CR tablet  Take 1 tablet (240 mg total) by mouth every evening with food 90 tablet 1   Vibegron  (GEMTESA ) 75 MG TABS Take 1 tablet (75 mg total) by mouth daily. 30 tablet 2   Vibegron  75 MG TABS Take 1 tablet (75 mg total) by mouth daily. 30 tablet 5   ALPRAZolam  (XANAX ) 0.25 MG tablet Take 1 tablet (0.25 mg total) by mouth 2 (two) times daily as needed for anxiety. (Patient not taking: Reported on 05/12/2024) 20 tablet 0   azithromycin  (ZITHROMAX ) 250 MG tablet Take 1 tablet (250 mg total) by mouth daily. Take first 2 tablets together, then 1 every day until finished. 6 tablet 0   benzonatate  (TESSALON ) 100 MG capsule Take 1 capsule (100 mg total) by mouth 3 (three) times daily as needed for cough. 30 capsule 0   oxybutynin  (DITROPAN -XL) 10 MG 24 hr tablet Take 1 tablet (10 mg total) by mouth at bedtime. (Patient not taking: Reported on 05/12/2024) 90 tablet 1   Facility-Administered Medications Prior to Visit  Medication Dose Route Frequency Provider Last Rate Last Admin   0.9 %  sodium chloride  infusion  500 mL Intravenous Once Abran Norleen SAILOR, MD        Allergies  Allergen Reactions   Lipitor [Atorvastatin] Other (See Comments)    Elevates LFT's    ROS Per HPI    Objective:    Physical Exam Constitutional:      General: She is not in acute distress.    Appearance: She is not ill-appearing.  Eyes:     Extraocular Movements: Extraocular movements intact.     Conjunctiva/sclera: Conjunctivae normal.  Cardiovascular:     Rate and Rhythm: Normal rate.  Pulmonary:     Effort: Pulmonary effort is normal.  Musculoskeletal:     Cervical back: Normal range of motion and neck supple.  Skin:    General: Skin is warm and dry.  Neurological:     General: No focal deficit present.     Mental Status: She is alert and oriented to person, place, and time.  Psychiatric:        Mood and Affect: Mood normal.        Behavior: Behavior  normal.        Thought Content: Thought content normal.      BP 138/68 (BP Location: Left Arm, Patient Position: Sitting)   Pulse 62   Temp 98.6 F (37 C) (Temporal)   Ht 5' 1.5 (1.562 m)   Wt 139 lb 12.8 oz (63.4 kg)   SpO2 97%   BMI 25.99 kg/m  Wt Readings from Last 3 Encounters:  05/12/24 139 lb 12.8 oz (63.4 kg)  04/13/24 145 lb 6.4 oz (66 kg)  01/27/24 149 lb 9.6 oz (67.9 kg)       Assessment & Plan:   Problem List Items Addressed This Visit     Essential hypertension - Primary   Fatty liver   Overactive bladder   Situational anxiety   Type 2 diabetes mellitus (HCC)   Vitamin D  deficiency   Other Visit Diagnoses       OSA (obstructive sleep apnea)         Iron  deficiency anemia, unspecified iron  deficiency anemia type           Assessment and Plan Assessment & Plan Situational anxiety Related to husband's health issues, causing difficulty sleeping and waking up at night. Sertraline  helps with sleep but may not be taken at the optimal time. Alprazolam  was not  well-tolerated due to feeling 'crazy' at night. Counseling was discussed but declined. - Advised taking sertraline  closer to bedtime to improve sleep duration. - Removed alprazolam  from the medication list due to adverse effects. - Encouraged reaching out to the cancer center for counseling resources if needed.  Overactive bladder Currently trialing Gemtesa  samples, but it is expensive. Considering Botox as an alternative treatment option. - Reach out to the urogynecologist to discuss the affordability of Gemtesa . - Consider Botox as an alternative treatment option if Gemtesa  is not affordable.  Iron  deficiency anemia Previously low iron  levels, currently advised to take one iron  pill daily. - Continue taking one iron  pill daily. - Follow up with heme/onc  Fatty liver Mildly elevated liver enzymes. Recent MRI showed no gallstones or gallbladder issues, but mild spleen enlargement noted - Continue  monitoring liver enzymes and follow up with GI as needed.  Obstructive sleep apnea Using CPAP for management. - Continue using CPAP as prescribed.  General health maintenance Recent blood work showed A1c in prediabetes range at 6.0%. Advised to improve diet to prevent further increase. - Advised dietary modifications to reduce sugar and carbohydrate intake.     I have discontinued Niels B. Sawchuk's ALPRAZolam , oxybutynin , azithromycin , and benzonatate . I am also having her maintain her Cyanocobalamin  (VITAMIN B-12 PO), acetaminophen , CALCIUM  MAGNESIUM  ZINC PO, minoxidil , aspirin  EC, ferrous sulfate, cloNIDine , Lancet Device, Lancets Misc., sertraline , Vibegron , OneTouch Delica Plus Lancet33G, Gemtesa , OneTouch Verio Flex System, glucose blood, fluticasone , rosuvastatin , atenolol , metFORMIN , verapamil , and olmesartan . We will continue to administer sodium chloride .  No orders of the defined types were placed in this encounter.

## 2024-05-13 ENCOUNTER — Other Ambulatory Visit (HOSPITAL_COMMUNITY): Payer: Self-pay

## 2024-05-13 ENCOUNTER — Encounter: Payer: PPO | Admitting: Internal Medicine

## 2024-05-14 ENCOUNTER — Other Ambulatory Visit (HOSPITAL_COMMUNITY): Payer: Self-pay

## 2024-05-16 ENCOUNTER — Other Ambulatory Visit (HOSPITAL_COMMUNITY): Payer: Self-pay

## 2024-05-17 ENCOUNTER — Other Ambulatory Visit (HOSPITAL_COMMUNITY): Payer: Self-pay

## 2024-05-18 ENCOUNTER — Other Ambulatory Visit (HOSPITAL_COMMUNITY): Payer: Self-pay

## 2024-05-19 ENCOUNTER — Other Ambulatory Visit: Payer: Self-pay

## 2024-05-19 ENCOUNTER — Other Ambulatory Visit (HOSPITAL_COMMUNITY): Payer: Self-pay

## 2024-05-20 ENCOUNTER — Other Ambulatory Visit (HOSPITAL_COMMUNITY): Payer: Self-pay

## 2024-05-20 ENCOUNTER — Other Ambulatory Visit: Payer: Self-pay

## 2024-05-20 MED FILL — Sertraline HCl Tab 100 MG: ORAL | 30 days supply | Qty: 45 | Fill #1 | Status: CN

## 2024-05-21 ENCOUNTER — Other Ambulatory Visit (HOSPITAL_COMMUNITY): Payer: Self-pay

## 2024-05-24 ENCOUNTER — Other Ambulatory Visit (HOSPITAL_COMMUNITY): Payer: Self-pay

## 2024-05-24 ENCOUNTER — Ambulatory Visit: Admitting: Obstetrics

## 2024-05-24 ENCOUNTER — Encounter: Payer: PPO | Admitting: Internal Medicine

## 2024-05-25 ENCOUNTER — Other Ambulatory Visit (HOSPITAL_COMMUNITY): Payer: Self-pay

## 2024-05-28 ENCOUNTER — Other Ambulatory Visit (HOSPITAL_COMMUNITY): Payer: Self-pay

## 2024-05-31 ENCOUNTER — Telehealth: Payer: Self-pay | Admitting: Adult Health

## 2024-05-31 NOTE — Telephone Encounter (Signed)
 Pt asked Megan, NP be informed that it has been about 6 weeks since she has been able to use her CPAP.  The wrong supplies were sent to her, but DME has promised to have the correct supplies sent out to her very soon.  Pt has not asked for a call back

## 2024-06-01 ENCOUNTER — Other Ambulatory Visit (HOSPITAL_COMMUNITY): Payer: Self-pay

## 2024-06-02 ENCOUNTER — Other Ambulatory Visit (HOSPITAL_COMMUNITY): Payer: Self-pay

## 2024-06-04 ENCOUNTER — Other Ambulatory Visit (HOSPITAL_COMMUNITY): Payer: Self-pay

## 2024-06-07 ENCOUNTER — Other Ambulatory Visit (HOSPITAL_COMMUNITY): Payer: Self-pay

## 2024-06-08 ENCOUNTER — Ambulatory Visit: Admitting: Adult Health

## 2024-06-09 ENCOUNTER — Other Ambulatory Visit (HOSPITAL_COMMUNITY): Payer: Self-pay

## 2024-06-10 ENCOUNTER — Other Ambulatory Visit (HOSPITAL_COMMUNITY): Payer: Self-pay

## 2024-06-10 ENCOUNTER — Other Ambulatory Visit: Payer: Self-pay

## 2024-06-11 ENCOUNTER — Other Ambulatory Visit: Payer: Self-pay

## 2024-06-11 ENCOUNTER — Other Ambulatory Visit (HOSPITAL_COMMUNITY): Payer: Self-pay

## 2024-06-12 ENCOUNTER — Other Ambulatory Visit (HOSPITAL_COMMUNITY): Payer: Self-pay

## 2024-06-14 ENCOUNTER — Encounter: Payer: Self-pay | Admitting: *Deleted

## 2024-06-15 ENCOUNTER — Other Ambulatory Visit: Payer: Self-pay

## 2024-06-15 ENCOUNTER — Other Ambulatory Visit: Payer: Self-pay | Admitting: Emergency Medicine

## 2024-06-15 ENCOUNTER — Other Ambulatory Visit (HOSPITAL_COMMUNITY): Payer: Self-pay

## 2024-06-15 DIAGNOSIS — F418 Other specified anxiety disorders: Secondary | ICD-10-CM

## 2024-06-15 MED FILL — Sertraline HCl Tab 100 MG: ORAL | 30 days supply | Qty: 45 | Fill #1 | Status: CN

## 2024-06-15 MED FILL — Sertraline HCl Tab 100 MG: ORAL | 30 days supply | Qty: 45 | Fill #1 | Status: AC

## 2024-06-16 ENCOUNTER — Other Ambulatory Visit (HOSPITAL_COMMUNITY): Payer: Self-pay

## 2024-06-17 ENCOUNTER — Other Ambulatory Visit (HOSPITAL_COMMUNITY): Payer: Self-pay

## 2024-06-21 ENCOUNTER — Other Ambulatory Visit (HOSPITAL_COMMUNITY): Payer: Self-pay

## 2024-06-21 NOTE — Progress Notes (Unsigned)
 "     [facility]   [phone] [phone]    Patient Care Team: Lendia Boby CROME, NP-C as PCP - General (Family Medicine) Nathanael Davies, [facility] (Inactive) as Pharmacist (Pharmacist)   CHIEF COMPLAINT: Follow up IDA  CURRENT THERAPY: Oral iron  BID  INTERVAL HISTORY Michele [name] returns for follow up as scheduled. Iron  labs/CBC in October- November showed IDA resolved   ROS   Past Medical History:  Diagnosis Date   Anemia    Anxiety    Arthritis    Carpal tunnel syndrome    Cataract    bilateral,removed   Cerebrovascular disease 01/02/2017   Duodenal diverticulum    Elevated uric acid in blood    Esophageal motility disorder    Fatty liver    GERD (gastroesophageal reflux disease)    Hyperlipidemia    Hypertension    Lymphocytic colitis 09/22/2012   Sleep apnea    Tubulovillous adenoma of colon    Type II or unspecified type diabetes mellitus without mention of complication, not stated as uncontrolled    Yeast detected    Rt big toe     Past Surgical History:  Procedure Laterality Date   APPENDECTOMY     BREAST BIOPSY Right    CARPAL TUNNEL RELEASE Right 1992   cataract extration     COLONOSCOPY     CYST REMOVAL HAND     endocopy      TONSILLECTOMY  1952     Outpatient Encounter Medications as of 06/22/2024  Medication Sig   acetaminophen  (TYLENOL ) 500 MG tablet Take 500 mg by mouth 3 (three) times daily as needed for moderate pain.   aspirin  EC 81 MG tablet Take 81 mg by mouth daily. Swallow whole.   atenolol  (TENORMIN ) 100 MG tablet Take 1 tablet (100 mg total) by mouth every morning for blood pressure   Blood Glucose Monitoring Suppl (ONETOUCH VERIO FLEX SYSTEM) w/Device KIT Use to test blood sugar twice daily   CALCIUM  MAGNESIUM  ZINC PO Take by mouth.   cloNIDine  (CATAPRES ) 0.2 MG tablet TAKE 1 TABLET BY MOUTH THREE TIMES DAILY AS NEEDED IF  BLOOD  PRESSURE  IS  OVER  160   Cyanocobalamin  (VITAMIN B-12 PO) Take 1 tablet by  mouth daily. 5000 mc one time in the AM PRN   ferrous sulfate 325 (65 FE) MG tablet Take 325 mg by mouth daily with breakfast.   fluticasone  (FLONASE ) 50 MCG/ACT nasal spray Place 2 sprays into both nostrils daily. Shake well before use. Gently blow nose before spraying. Do not blow nose immediately after use. You should not taste the medication or feel it going down your throat; if you do, adjust your technique.   glucose blood test strip Use to check blood glucose twice daily   Lancet Device MISC May substitute to any manufacturer covered by patient's insurance.   Lancets (ONETOUCH DELICA PLUS LANCET33G) MISC Use to check blood sugar twice daily   Lancets Misc. MISC Use to check blood sugars 2x daily. May substitute to any manufacturer covered by patient's insurance.   metFORMIN  (GLUCOPHAGE -XR) 500 MG 24 hr tablet Take 2 tablets (1,000 mg total) by mouth 2 (two) times daily with a meal for Diabetes   minoxidil  (LONITEN ) 10 MG tablet Take 1 tablet daily for BP   olmesartan  (BENICAR ) 40 MG tablet Take 1 tablet (40 mg total) by mouth every evening for Blood pressure   rosuvastatin  (CRESTOR ) 20 MG tablet Take 1 tablet (20 mg  total) by mouth every morning.   sertraline  (ZOLOFT ) 100 MG tablet Take 1.5 tablets (150 mg total) by mouth daily.   verapamil  (CALAN -SR) 240 MG CR tablet Take 1 tablet (240 mg total) by mouth every evening with food   Vibegron  (GEMTESA ) 75 MG TABS Take 1 tablet (75 mg total) by mouth daily.   Vibegron  75 MG TABS Take 1 tablet (75 mg total) by mouth daily.   Facility-Administered Encounter Medications as of 06/22/2024  Medication   0.9 %  sodium chloride  infusion     There were no vitals filed for this visit. There is no height or weight on file to calculate BMI.   ECOG PERFORMANCE STATUS: {CHL ONC ECOG PS:[id number]}  PHYSICAL EXAM GENERAL:alert, no distress and comfortable SKIN: no rash  EYES: sclera clear NECK: without mass LYMPH:  no palpable cervical or  supraclavicular lymphadenopathy  LUNGS: clear with normal breathing effort HEART: regular rate & rhythm, no lower extremity edema ABDOMEN: abdomen soft, non-tender and normal bowel sounds NEURO: alert & oriented x 3 with fluent speech, no focal motor/sensory deficits Breast exam:  PAC without erythema    CBC    Latest Ref Rng & Units 04/20/2024    4:33 PM 03/23/2024   11:34 AM 12/31/2023   11:28 AM  CBC  WBC 4.0 - 10.5 K/uL 5.9  5.3  5.0   Hemoglobin 12.0 - 15.0 g/dL 87.5  87.4  87.7   Hematocrit 36.0 - 46.0 % 36.4  38.0  37.0   Platelets 150.0 - 400.0 K/uL 166.0  141  143.0       CMP     Latest Ref Rng & Units 04/20/2024    4:33 PM 01/05/2024    4:01 PM 12/31/2023   11:28 AM  CMP  Glucose 70 - 99 mg/dL 845   895   BUN 6 - 23 mg/dL 14   10   Creatinine 9.59 - 1.20 mg/dL 9.15   9.24   Sodium 864 - 145 mEq/L 139   140   Potassium 3.5 - 5.1 mEq/L 4.1   4.2   Chloride 96 - 112 mEq/L 103   103   CO2 19 - 32 mEq/L 25   29   Calcium  8.4 - 10.5 mg/dL 9.3   9.4   Total Protein 6.0 - 8.3 g/dL 6.7  7.3  6.5   Total Bilirubin 0.2 - 1.2 mg/dL 0.8  1.0  0.9   Alkaline Phos 39 - 117 U/L 59  63  56   AST 0 - 37 U/L 43  41  66   ALT 0 - 35 U/L 36  32  44       ASSESSMENT & PLAN:79 year old female      Anemia, secondary to iron  deficiency -We reviewed her medical record in detail with the patient -She initially developed microcytosis without anemia and August 2020, then developed a mild intermittent anemia without iron  deficiency Hgb 11-12 range and early 2022.  Colonoscopy 03/2023 was normal. -She then had an mildly progressive anemia in 08/2023 Hgb 10-11 range, with ferritin of 5, and positive FOBT -B12, folate, TSH normal. MCV has been normal, this is not thalassemia -GI workup by Dr. Abran has not identified a bleeding source. A capsule endoscopy 01/01/24 was inconclusive due to the capsule remaining in the stomach  -IDA eventually resolved in late 2025       PLAN:  No orders  of the defined types were placed in this encounter.  All questions were answered. The patient knows to call the clinic with any problems, questions or concerns. No barriers to learning were detected. I spent *** counseling the patient face to face. The total time spent in the appointment was *** and more than 50% was on counseling, review of test results, and coordination of care.   Kaitlynd Phillips K Shann Lewellyn, NP 06/21/2024 3:37 PM  "

## 2024-06-22 ENCOUNTER — Other Ambulatory Visit (HOSPITAL_COMMUNITY): Payer: Self-pay

## 2024-06-22 ENCOUNTER — Inpatient Hospital Stay: Admitting: Nurse Practitioner

## 2024-06-22 ENCOUNTER — Inpatient Hospital Stay

## 2024-06-22 ENCOUNTER — Other Ambulatory Visit: Payer: Self-pay

## 2024-06-23 ENCOUNTER — Other Ambulatory Visit (HOSPITAL_COMMUNITY): Payer: Self-pay

## 2024-06-24 ENCOUNTER — Other Ambulatory Visit (HOSPITAL_COMMUNITY): Payer: Self-pay

## 2024-06-29 NOTE — Progress Notes (Unsigned)
 "  Office Visit Note  Patient: Michele [name]             Date of Birth: 1946/03/18           MRN: [id number]             PCP: Lendia Boby CROME, NP-C Referring: [name], PA-C Visit Date: 07/13/2024 Occupation: Data Unavailable  Subjective:  No chief complaint on file.   History of Present Illness: Michele [name] is a 79 y.o. female ***     Activities of Daily Living:  Patient reports morning stiffness for *** {minute/hour:19697}.   Patient {ACTIONS;DENIES/REPORTS:21021675::Denies} nocturnal pain.  Difficulty dressing/grooming: {ACTIONS;DENIES/REPORTS:21021675::Denies} Difficulty climbing stairs: {ACTIONS;DENIES/REPORTS:21021675::Denies} Difficulty getting out of chair: {ACTIONS;DENIES/REPORTS:21021675::Denies} Difficulty using hands for taps, buttons, cutlery, and/or writing: {ACTIONS;DENIES/REPORTS:21021675::Denies}  No Rheumatology ROS completed.   PMFS History:  Patient Active Problem List   Diagnosis Date Noted   Dizziness 04/20/2024   Urinary incontinence 08/31/2023   Overactive bladder 08/31/2023   Onychomycosis of toenail 08/31/2023   Hyponatremia 06/10/2022   HLD (hyperlipidemia) 06/10/2022   Lumbar spondylosis 01/01/2021   Osteopenia 06/08/2020   Depression, major, recurrent, 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   Situational anxiety 12/10/2018   OSA on CPAP 09/18/2018   Vitiligo 11/09/2017   Type 2 diabetes mellitus (HCC) 07/24/2017   Overweight (BMI 25.0-29.9) 07/24/2017   Cerebrovascular disease 01/02/2017   History of hyperthyroidism 09/11/2015   Vitamin D  deficiency 06/28/2013   CKD stage 2 due to type 2 diabetes mellitus (HCC)    Hyperlipidemia associated with type 2 diabetes mellitus (HCC) 03/21/2008   Essential hypertension 03/21/2008   GERD 03/21/2008   Fatty liver 03/21/2008   History of colonic polyps 03/21/2008    Past Medical History:  Diagnosis Date   Anemia    Anxiety    Arthritis    Carpal tunnel syndrome    Cataract     bilateral,removed   Cerebrovascular disease 01/02/2017   Duodenal diverticulum    Elevated uric acid in blood    Esophageal motility disorder    Fatty liver    GERD (gastroesophageal reflux disease)    Hyperlipidemia    Hypertension    Lymphocytic colitis 09/22/2012   Sleep apnea    Tubulovillous adenoma of colon    Type II or unspecified type diabetes mellitus without mention of complication, not stated as uncontrolled    Yeast detected    Rt big toe    Family History  Problem Relation Age of Onset   Heart disease Mother    Diabetes Mother    Heart disease Father    Diabetes Father    Heart attack Father    Cerebral aneurysm Sister    Heart disease Brother    Heart attack Brother    Heart disease Brother        Stent   Colon cancer Neg Hx    Esophageal cancer Neg Hx    Stomach cancer Neg Hx    Rectal cancer Neg Hx    Breast cancer Neg Hx    Sleep apnea Neg Hx    Colon polyps Neg Hx    Crohn's disease Neg Hx    Ulcerative colitis Neg Hx    Past Surgical History:  Procedure Laterality Date   APPENDECTOMY     BREAST BIOPSY Right    CARPAL TUNNEL RELEASE Right 1992   cataract extration     COLONOSCOPY     CYST REMOVAL HAND     endocopy  TONSILLECTOMY  1952   Social History[1] Social History   Social History Narrative   Lives with husband.  Independent of ADLs. 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   Right-handed   Caffeine: about 1/2 cup of coffee per day, tea and Cokes a few times per week     Immunization History  Administered Date(s) Administered   Hepb-cpg 02/09/2024, 03/08/2024   INFLUENZA, HIGH DOSE SEASONAL PF 03/15/2014, 03/07/2015, 03/27/2016, 04/21/2017, 02/18/2018, 03/11/2019, 03/20/2020, 03/30/2021, 04/18/2022, 03/20/2023, 04/23/2024   Influenza Split 03/25/2013   PFIZER Comirnaty(Gray Top)Covid-19 Tri-Sucrose Vaccine 04/20/2022   PFIZER(Purple Top)SARS-COV-2 Vaccination 06/24/2019, 07/15/2019, 03/08/2020    Pfizer(Comirnaty)Fall Seasonal Vaccine 12 years and older 04/20/2022   Pneumococcal Conjugate-13 01/26/2014   Pneumococcal Polysaccharide-23 03/14/2011   Respiratory Syncytial Virus Vaccine,Recomb Aduvanted(Arexvy) 04/20/2022   Td 03/25/2013, 02/06/2023   Unspecified SARS-COV-2 Vaccination 04/04/2023   Zoster Recombinant(Shingrix) 02/07/2020, 09/18/2023   Zoster, Live 06/03/2005   Zoster, Unspecified 09/20/2023     Objective: Vital Signs: There were no vitals taken for this visit.   Physical Exam   Musculoskeletal Exam: ***  CDAI Exam: CDAI Score: -- Patient Global: --; Provider Global: -- Swollen: --; Tender: -- Joint Exam 07/13/2024   No joint exam has been documented for this visit   There is currently no information documented on the homunculus. Go to the Rheumatology activity and complete the homunculus joint exam.  Investigation: No additional findings.  Imaging: No results found.  Recent Labs: Lab Results  Component Value Date   WBC 5.9 04/20/2024   HGB 12.4 04/20/2024   PLT 166.0 04/20/2024   NA 139 04/20/2024   K 4.1 04/20/2024   CL 103 04/20/2024   CO2 25 04/20/2024   GLUCOSE 154 (H) 04/20/2024   BUN 14 04/20/2024   CREATININE 0.84 04/20/2024   BILITOT 0.8 04/20/2024   ALKPHOS 59 04/20/2024   AST 43 (H) 04/20/2024   ALT 36 (H) 04/20/2024   PROT 6.7 04/20/2024   ALBUMIN 4.2 04/20/2024   CALCIUM  9.3 04/20/2024   GFRAA 79 09/28/2020   January 05, 2024 IgA normal, antigliadin antibody negative, anti-tTG negative, ANA 1: 80 cytoplasmic, 1: 80 nucleolar, hepatitis B nonreactive, hepatitis A antibody reactive, hepatitis C nonreactive, antimitochondrial antibody negative, anti-smooth muscle antibody negative, serum plasmin normal, alpha-1 antitrypsin normal March 23, 2024 TSH normal, hemoglobin A1c 6.0  Speciality Comments: No specialty comments available.  Procedures:  No procedures performed Allergies: Lipitor [atorvastatin]   Assessment / Plan:      Visit Diagnoses: Positive ANA (antinuclear antibody)  Lumbar spondylosis  Osteopenia of multiple sites  Vitamin D  deficiency  CKD stage 2 due to type 2 diabetes mellitus (HCC)  Type 2 diabetes mellitus with stage 2 chronic kidney disease, without long-term current use of insulin  (HCC)  Cerebrovascular disease  Essential hypertension  Mixed hyperlipidemia  Fatty liver  Gastroesophageal reflux disease without esophagitis  History of colonic polyps  History of hyperthyroidism  Overactive bladder  Vitiligo  OSA on CPAP  Anxiety and depression  Overweight (BMI 25.0-29.9)  Orders: No orders of the defined types were placed in this encounter.  No orders of the defined types were placed in this encounter.   Face-to-face time spent with patient was *** minutes. Greater than 50% of time was spent in counseling and coordination of care.  Follow-Up Instructions: No follow-ups on file.   Maya Nash, MD  Note - This record has been created using Animal nutritionist.  Chart creation errors have been sought, but may not  always  have been located. Such creation errors do not reflect on  the standard of medical care.    [1]  Social History Tobacco Use   Smoking status: Never   Smokeless tobacco: Never  Vaping Use   Vaping status: Never Used  Substance Use Topics   Alcohol use: No   Drug use: No   "

## 2024-07-03 ENCOUNTER — Other Ambulatory Visit: Payer: Self-pay | Admitting: Family Medicine

## 2024-07-05 ENCOUNTER — Other Ambulatory Visit (HOSPITAL_COMMUNITY): Payer: Self-pay

## 2024-07-05 MED ORDER — SERTRALINE HCL 100 MG PO TABS
150.0000 mg | ORAL_TABLET | Freq: Every day | ORAL | 1 refills | Status: AC
  Filled 2024-07-05 – 2024-07-09 (×2): qty 45, 30d supply, fill #0

## 2024-07-09 ENCOUNTER — Other Ambulatory Visit (HOSPITAL_COMMUNITY): Payer: Self-pay

## 2024-07-13 ENCOUNTER — Encounter: Admitting: Rheumatology

## 2024-07-13 DIAGNOSIS — F32A Depression, unspecified: Secondary | ICD-10-CM

## 2024-07-13 DIAGNOSIS — Z860100 Personal history of colon polyps, unspecified: Secondary | ICD-10-CM

## 2024-07-13 DIAGNOSIS — Z8639 Personal history of other endocrine, nutritional and metabolic disease: Secondary | ICD-10-CM

## 2024-07-13 DIAGNOSIS — L80 Vitiligo: Secondary | ICD-10-CM

## 2024-07-13 DIAGNOSIS — E663 Overweight: Secondary | ICD-10-CM

## 2024-07-13 DIAGNOSIS — K219 Gastro-esophageal reflux disease without esophagitis: Secondary | ICD-10-CM

## 2024-07-13 DIAGNOSIS — I10 Essential (primary) hypertension: Secondary | ICD-10-CM

## 2024-07-13 DIAGNOSIS — E559 Vitamin D deficiency, unspecified: Secondary | ICD-10-CM

## 2024-07-13 DIAGNOSIS — R7689 Other specified abnormal immunological findings in serum: Secondary | ICD-10-CM

## 2024-07-13 DIAGNOSIS — N3281 Overactive bladder: Secondary | ICD-10-CM

## 2024-07-13 DIAGNOSIS — M8589 Other specified disorders of bone density and structure, multiple sites: Secondary | ICD-10-CM

## 2024-07-13 DIAGNOSIS — I679 Cerebrovascular disease, unspecified: Secondary | ICD-10-CM

## 2024-07-13 DIAGNOSIS — B351 Tinea unguium: Secondary | ICD-10-CM

## 2024-07-13 DIAGNOSIS — M47816 Spondylosis without myelopathy or radiculopathy, lumbar region: Secondary | ICD-10-CM

## 2024-07-13 DIAGNOSIS — E1122 Type 2 diabetes mellitus with diabetic chronic kidney disease: Secondary | ICD-10-CM

## 2024-07-13 DIAGNOSIS — K760 Fatty (change of) liver, not elsewhere classified: Secondary | ICD-10-CM

## 2024-07-13 DIAGNOSIS — G4733 Obstructive sleep apnea (adult) (pediatric): Secondary | ICD-10-CM

## 2024-07-13 DIAGNOSIS — E782 Mixed hyperlipidemia: Secondary | ICD-10-CM

## 2024-08-02 ENCOUNTER — Ambulatory Visit: Admitting: Podiatry

## 2024-08-11 ENCOUNTER — Ambulatory Visit: Admitting: Rheumatology

## 2024-08-11 ENCOUNTER — Ambulatory Visit: Admitting: Family Medicine

## 2024-10-28 ENCOUNTER — Ambulatory Visit

## 2025-01-26 ENCOUNTER — Ambulatory Visit: Admitting: Adult Health
